# Patient Record
Sex: Male | Born: 1945
Health system: Southern US, Community
[De-identification: ages and names within clinical notes are randomized; demographics above are authoritative.]

## PROBLEM LIST (undated history)

## (undated) DIAGNOSIS — I2510 Atherosclerotic heart disease of native coronary artery without angina pectoris: Secondary | ICD-10-CM

## (undated) DIAGNOSIS — R7989 Other specified abnormal findings of blood chemistry: Secondary | ICD-10-CM

## (undated) DIAGNOSIS — Z7901 Long term (current) use of anticoagulants: Secondary | ICD-10-CM

## (undated) DIAGNOSIS — F419 Anxiety disorder, unspecified: Secondary | ICD-10-CM

## (undated) DIAGNOSIS — F32A Depression, unspecified: Secondary | ICD-10-CM

## (undated) DIAGNOSIS — I219 Acute myocardial infarction, unspecified: Secondary | ICD-10-CM

## (undated) DIAGNOSIS — N189 Chronic kidney disease, unspecified: Secondary | ICD-10-CM

## (undated) DIAGNOSIS — I10 Essential (primary) hypertension: Secondary | ICD-10-CM

## (undated) DIAGNOSIS — C801 Malignant (primary) neoplasm, unspecified: Secondary | ICD-10-CM

## (undated) DIAGNOSIS — Z87442 Personal history of urinary calculi: Secondary | ICD-10-CM

## (undated) DIAGNOSIS — I4892 Unspecified atrial flutter: Secondary | ICD-10-CM

## (undated) DIAGNOSIS — F329 Major depressive disorder, single episode, unspecified: Secondary | ICD-10-CM

## (undated) DIAGNOSIS — H269 Unspecified cataract: Secondary | ICD-10-CM

## (undated) DIAGNOSIS — I779 Disorder of arteries and arterioles, unspecified: Secondary | ICD-10-CM

## (undated) DIAGNOSIS — N200 Calculus of kidney: Secondary | ICD-10-CM

## (undated) DIAGNOSIS — E7800 Pure hypercholesterolemia, unspecified: Secondary | ICD-10-CM

## (undated) DIAGNOSIS — I77819 Aortic ectasia, unspecified site: Secondary | ICD-10-CM

## (undated) DIAGNOSIS — I209 Angina pectoris, unspecified: Secondary | ICD-10-CM

## (undated) DIAGNOSIS — I2699 Other pulmonary embolism without acute cor pulmonale: Secondary | ICD-10-CM

## (undated) DIAGNOSIS — G4730 Sleep apnea, unspecified: Secondary | ICD-10-CM

## (undated) DIAGNOSIS — C439 Malignant melanoma of skin, unspecified: Secondary | ICD-10-CM

## (undated) DIAGNOSIS — I77810 Thoracic aortic ectasia: Secondary | ICD-10-CM

## (undated) DIAGNOSIS — E119 Type 2 diabetes mellitus without complications: Secondary | ICD-10-CM

## (undated) DIAGNOSIS — K219 Gastro-esophageal reflux disease without esophagitis: Secondary | ICD-10-CM

## (undated) HISTORY — PX: CORONARY ARTERY BYPASS GRAFT: SHX141

## (undated) HISTORY — PX: CARDIAC CATHETERIZATION: SHX172

## (undated) HISTORY — PX: CATARACT EXTRACTION: SUR2

## (undated) HISTORY — PX: CHOLECYSTECTOMY: SHX55

## (undated) HISTORY — PX: KNEE ARTHROSCOPY: SUR90

## (undated) HISTORY — PX: LAPAROSCOPIC CHOLECYSTECTOMY: SUR755

## (undated) HISTORY — PX: OTHER SURGICAL HISTORY: SHX169

## (undated) HISTORY — DX: Chronic kidney disease, unspecified: N18.9

---

## 1997-09-29 DIAGNOSIS — Z951 Presence of aortocoronary bypass graft: Secondary | ICD-10-CM

## 1997-09-29 HISTORY — PX: CORONARY ARTERY BYPASS GRAFT: SHX141

## 1997-09-29 HISTORY — DX: Presence of aortocoronary bypass graft: Z95.1

## 1998-04-30 ENCOUNTER — Inpatient Hospital Stay (HOSPITAL_COMMUNITY): Admission: EM | Admit: 1998-04-30 | Discharge: 1998-05-02 | Payer: Self-pay | Admitting: Internal Medicine

## 1998-05-17 ENCOUNTER — Ambulatory Visit (HOSPITAL_COMMUNITY): Admission: RE | Admit: 1998-05-17 | Discharge: 1998-05-18 | Payer: Self-pay | Admitting: Cardiology

## 1998-06-05 ENCOUNTER — Encounter (HOSPITAL_COMMUNITY): Admission: RE | Admit: 1998-06-05 | Discharge: 1998-09-03 | Payer: Self-pay | Admitting: Cardiology

## 1998-07-12 ENCOUNTER — Observation Stay (HOSPITAL_COMMUNITY): Admission: AD | Admit: 1998-07-12 | Discharge: 1998-07-13 | Payer: Self-pay | Admitting: Cardiology

## 1998-09-20 ENCOUNTER — Inpatient Hospital Stay (HOSPITAL_COMMUNITY): Admission: AD | Admit: 1998-09-20 | Discharge: 1998-09-25 | Payer: Self-pay | Admitting: Interventional Cardiology

## 1998-09-21 ENCOUNTER — Encounter: Payer: Self-pay | Admitting: Cardiothoracic Surgery

## 1998-09-22 ENCOUNTER — Encounter: Payer: Self-pay | Admitting: Cardiothoracic Surgery

## 1998-09-23 ENCOUNTER — Encounter: Payer: Self-pay | Admitting: Cardiothoracic Surgery

## 1998-09-24 ENCOUNTER — Encounter: Payer: Self-pay | Admitting: Cardiothoracic Surgery

## 1998-10-30 ENCOUNTER — Encounter (HOSPITAL_COMMUNITY): Admission: RE | Admit: 1998-10-30 | Discharge: 1999-01-28 | Payer: Self-pay | Admitting: Cardiology

## 2001-05-13 ENCOUNTER — Encounter: Admission: RE | Admit: 2001-05-13 | Discharge: 2001-05-13 | Payer: Self-pay | Admitting: Dermatology

## 2001-05-13 ENCOUNTER — Encounter: Payer: Self-pay | Admitting: Dermatology

## 2001-05-20 ENCOUNTER — Encounter: Payer: Self-pay | Admitting: Dermatology

## 2001-05-20 ENCOUNTER — Ambulatory Visit (HOSPITAL_COMMUNITY): Admission: RE | Admit: 2001-05-20 | Discharge: 2001-05-20 | Payer: Self-pay | Admitting: Dermatology

## 2001-12-07 ENCOUNTER — Inpatient Hospital Stay (HOSPITAL_COMMUNITY): Admission: EM | Admit: 2001-12-07 | Discharge: 2001-12-14 | Payer: Self-pay

## 2001-12-08 ENCOUNTER — Encounter: Payer: Self-pay | Admitting: Internal Medicine

## 2001-12-09 ENCOUNTER — Encounter: Payer: Self-pay | Admitting: Internal Medicine

## 2001-12-11 ENCOUNTER — Encounter: Payer: Self-pay | Admitting: Internal Medicine

## 2002-04-26 ENCOUNTER — Encounter: Payer: Self-pay | Admitting: Family Medicine

## 2002-04-26 ENCOUNTER — Encounter: Admission: RE | Admit: 2002-04-26 | Discharge: 2002-04-26 | Payer: Self-pay | Admitting: Family Medicine

## 2002-11-01 ENCOUNTER — Encounter: Payer: Self-pay | Admitting: Internal Medicine

## 2002-11-01 ENCOUNTER — Encounter: Admission: RE | Admit: 2002-11-01 | Discharge: 2002-11-01 | Payer: Self-pay | Admitting: Internal Medicine

## 2002-12-02 ENCOUNTER — Encounter: Payer: Self-pay | Admitting: General Surgery

## 2002-12-02 ENCOUNTER — Encounter (INDEPENDENT_AMBULATORY_CARE_PROVIDER_SITE_OTHER): Payer: Self-pay | Admitting: *Deleted

## 2002-12-02 ENCOUNTER — Ambulatory Visit (HOSPITAL_COMMUNITY): Admission: RE | Admit: 2002-12-02 | Discharge: 2002-12-03 | Payer: Self-pay | Admitting: General Surgery

## 2003-10-23 ENCOUNTER — Ambulatory Visit (HOSPITAL_COMMUNITY): Admission: RE | Admit: 2003-10-23 | Discharge: 2003-10-23 | Payer: Self-pay | Admitting: Cardiology

## 2004-12-16 ENCOUNTER — Encounter: Admission: RE | Admit: 2004-12-16 | Discharge: 2004-12-16 | Payer: Self-pay | Admitting: Internal Medicine

## 2005-09-16 ENCOUNTER — Encounter (INDEPENDENT_AMBULATORY_CARE_PROVIDER_SITE_OTHER): Payer: Self-pay | Admitting: Specialist

## 2005-09-16 ENCOUNTER — Ambulatory Visit (HOSPITAL_COMMUNITY): Admission: RE | Admit: 2005-09-16 | Discharge: 2005-09-16 | Payer: Self-pay | Admitting: Gastroenterology

## 2008-09-26 ENCOUNTER — Encounter: Admission: RE | Admit: 2008-09-26 | Discharge: 2008-09-26 | Payer: Self-pay | Admitting: Oral Surgery

## 2011-02-14 NOTE — Discharge Summary (Signed)
[place]. [facility]  Patient:    Evan [name], Evan [name] Visit Number: [id number] MRN: [id number]          Service Type: MED Location: [phone] Attending Physician:  Jerl Santos Dictated by:   Pearla Dubonnet, M.D. Admit Date:  12/07/2001 Discharge Date: 12/14/2001   CC:         [name], M.D. [facility])  Kerrin Champagne, M.D.   Discharge Summary  NO DICTATION Dictated by:   Pearla Dubonnet, M.D. Attending Physician:  Jerl Santos DD:  12/14/01 TD:  12/14/01 Job: [phone] [id number]

## 2011-02-14 NOTE — Consult Note (Signed)
[place]. [facility]  Patient:    Evan [name], Evan [name] Visit Number: [id number] MRN: [id number]          Service Type: MED Location: MICU 2108 01 Attending Physician:  Jerl Santos Dictated by:   Kerrin Champagne, M.D. Proc. Date: 12/09/01 Admit Date:  12/07/2001                            Consultation Report  REFERRING PHYSICIAN:  Dianna Limbo, M.D.,  DIAGNOSES: 1. Bilateral prepatella bursitis and right olecranon bursitis, gouty    inflammatory bursitis versus septic bursitis. 2. Melanoma, status post left cervical lymph node resection with one of four    T nodes positive.  Status post resection of melanoma left anterior distal    leg. 3. Acute renal insufficiency due to dehydration. 4. Bilateral pulmonary emboli.  HISTORY OF PRESENT ILLNESS:  The patient is a 65 year old male with a history of melanoma admitted with a history of bilateral pulmonary embolus and acute renal insufficiency.  The patient was found to have a few white cells within his urine and significant elevated sugar in his urine with a history of diabetes mellitus.  He has had cultures which have returned negative on urine.  He was admitted on December 07, 2001, and has over the past one day, two days postadmission, began developing swelling and more tenderness over both anterior knees and both ankles.  Temperature is low grade, 99.6.  Initial white cell count was elevated and has since diminished.  There is a past history of gout versus pseudogout treated by Dr. Georges Lynch. Gioffre in the past and has had arthroscopic surgery in the past and was told he had gout.  Clinically, his exam shows bilateral knee swelling with boggy feeling prepatella bursa both sides and over the right olecranon bursa.  He is able to move his joints quite easily and readily.  Minimal effusion both knees. Ankles without appreciable effusion.  Laboratory studies showed a BUN today, December 09, 2001, of 47,  creatinine 2.9, uric acid level is 6.2.  Radiographs are pending.  Clinically full range of motion of both knees and right elbow.  Bogginess as noted above.  Today, I attempted aspiration of the left knee via lateral aspiration of the joint line using local anesthetic and sterile prep with Betadine.  This was then dry tapped with no fluid removed.  An 18 gauge needle was used.  IMPRESSION:  Synovitis both knees and ankles involving primarily the prepatella bursa and synovial lining of the joints.  Right elbow olecranon bursitis.  The patients clinical course with the history of acute renal failure is most consistent with gouty flare; however, the possibility of septic arthritis does exist.  The patients uric acid level is not significantly; however, we may be seeing results of previously elevated uric acid level and deposition within the joints.  PLAN:  Unable to aspirate fluid from the left knee.  Therefore, a steroid injection was not performed.  Went ahead and started the patient on colchicine after discussion with Dr. Dianna Limbo who has discussed issues regarding this medication with Dr. Fayrene Fearing L. Deterding of renal.  I have discussed with Dr. Gerarda Fraction. Murinson the possibility of the use of steroids.  He has indicated that steroids to may be used in the setting of acute gout if necessary.  The patient will be started on colchicine hopefully within the next six to eight hours.  We will have a better understanding of his current process should improvement occur.  We will follow with you.  Presently, I do not see any need for aspiration of his bursa cavities. Dictated by:   Kerrin Champagne, M.D. Attending Physician:  Jerl Santos DD:  12/09/01 TD:  12/11/01 Job: [id number]

## 2011-02-14 NOTE — H&P (Signed)
[place]. [facility]  Patient:    Evan [name], Evan [name] Visit Number: [id number] MRN: [id number]          Service Type: MED Location: MICU 2108 01 Attending Physician:  Jerl Santos Dictated by:   Jerl Santos, M.D. Admit Date:  12/07/2001                           History and Physical  DATE OF BIRTH: 1946-04-19  CHIEF COMPLAINT: Pulmonary embolus.  HISTORY OF PRESENT ILLNESS: The patient reports that earlier today he began to experience flank discomfort associated with increased difficulty breathing. He was transported to the emergency room, where the initial consideration was a possible kidney stone.  Subsequent work-up included a spiral CT scan, which revealed evidence of multiple pulmonary emboli.  In addition, this diagnosis was further confirmed by the finding of hypoxia.  Currently he is receiving oxygen and intravenous heparin.  Problem #2 is vomiting and dehydration.  Evan [name] states that he was discharged from the hospital about five days ago after undergoing a biopsy of a lymph node.  Saturday and Sunday he felt reasonably well but noticed that he was having some difficulty urinating.  By Monday he was having a lot of difficulty urinating, and in addition was experiencing increased nausea and vomiting.  He was unable to eat.  After about 2 a.m. this morning he noticed that it was really very difficult for him to urinate.  He did not pass any urine after this time.  Vomiting and diarrhea persisted and on presentation to the emergency room he has continued to feel nauseated, and has had at least one loose stool.  There does not seem to have been any hematemesis or melena. In the emergency room he was noted to have very concentrated urine, and his creatinine was noted to be 2.7.  Problem #3 is possible febrile illness.  During the course of this illness his wife feels that he may have been running a temperature and having  chills.  He does not seem to have had any other associated problems except as noted above.  Problem #4 is diabetes.  The patient has a long-standing history of diabetes. He states that since his discharge from the hospital he has not measured his blood sugar.  He states that he is compliant with his medications, which are outlined below.  Problem #5 is coronary artery disease.  Evan [name] is status post bypass surgery September 19, 1998.  He has not had any recent problems with exertional chest pain.  Problem #6 is hypertension.  The patient is compliant with his medications. There is no history of congestive heart failure.  His medicines are as outlined below.  ALLERGIES: He is allergic to PENICILLIN.  CURRENT MEDICATIONS:  1. Actos 30 mg q.d.  2. Glucophage 1 g b.i.d.  3. Tricor 200 mg q.d.  4. Accupril 20 mg q.d.  5. Paxil 20 mg q.d.  6. Toprol XL 100 mg q.d.  7. Pravachol 20 mg q.d.  8. Aspirin 325 mg q.d.  9. Vitamin 400 IU q.d. 10. Folic acid 1 mg q.d. 11. CoQ10 75 mg q.d. 12. Centrum Silver one q.d. 13. Vitamin B one q.d. 14. Organic flaxseed oil.  PAST MEDICAL HISTORY:  1. The patient is status post CABG September 19, 1998, and underwent a LIMA     to LAD as well as a saphenous vein graft to the diagonal,  left circumflex,     and PDA vessels.  2. He also underwent right knee arthroscopy in 1993.  3. In addition, the patient was diagnosed with melanoma, which was found in     both his nose and his left leg.  More recently he has had a swollen lymph     node, which has been biopsied and excised.  The pathology on this lymph     node is pending.  HEALTH MAINTENANCE: The patient had sigmoidoscopy February 1999, which was unremarkable.  FAMILY HISTORY: The patients father died at the age of 25 of heart disease. Mother died at the age of 47 of hypertension and heart disease, as well as a stroke.  He has a sister who is 43 and has hypertension and  diabetes.  SOCIAL HISTORY: He does not smoke.  He denies use of alcohol or drugs.  REVIEW OF SYSTEMS: HEAD: He denies headache or dizziness.  EYES: He denies visual blurring or diplopia.  EARS/NOSE/THROAT: Denies earache, sinus pain, or sore throat.  CHEST: Denies coughing, otherwise see above.  CARDIOVASCULAR: See above.  GI: See above.  GU: He has had no dysuria.  No back pain or unusual hematuria.  NEUROLOGIC: He denies seizure or stroke.  ENDOCRINE: He denies excessive thirst, urinary frequency, or nocturia.  PHYSICAL EXAMINATION:  GENERAL: He is an obese gentleman, who exhibits mild to moderate dyspnea.  He is also noted to be tachycardic.  HEENT: Within normal limits.  CHEST: Remarkably clear to auscultation.  It is noteworthy that he has a well-healed incision on the left neck area, with a drain in place.  It does not appear to be grossly infected.  CARDIOVASCULAR: Normal S1 and S2 without rubs, murmurs, or gallops.  ABDOMEN: Benign.  There are normal bowel sounds without masses, tenderness, or organomegaly.  NEUROLOGIC: Normal.  EXTREMITIES: Normal.  IMPRESSION:  1. Pulmonary embolus.  2. Marked dehydration.  3. Nausea and vomiting.  4. History of melanoma, with probable recurrence.  5. Diabetes.  6. Hyperlipidemia.  7. Coronary artery disease, status post coronary artery bypass grafting.  PLAN:  1. [name] will be admitted to the hospital for evaluation of these     problems.  2. He will need to be closely monitored.  3. It will be important to make sure that his oxygenation is regulated and     that he is given vigorous IV hydration.  4. Will monitor his blood sugars and administer sliding-scale insulin     regimen.  5. The anticipation would be that much of his renal dysfunction is prerenal     and that this should improve with vigorous IV hydration.  We will     certainly follow him closely. Dictated by:   Jerl Santos, M.D. Attending  Physician:  Jerl Santos DD:  12/07/01 TD:  12/08/01  Job: [id number]

## 2011-02-14 NOTE — Op Note (Signed)
Evan [name], ROTE NO.:  [id number]   MEDICAL RECORD NO.:  [id number]          PATIENT TYPE:  AMB   LOCATION:  ENDO                         FACILITY:  [facility]   PHYSICIAN:  Danise Edge, M.D.   DATE OF BIRTH:  1946-04-11   DATE OF PROCEDURE:  09/16/2005  DATE OF DISCHARGE:                                 OPERATIVE REPORT   PROCEDURE:  Screening colonoscopy.   INDICATIONS:  Mr. Wasif Simonich is a 65 year old male born December 26, 1945. Mr. Marchetta is scheduled to undergo his first screening colonoscopy  with polypectomy to prevent colon cancer.   ENDOSCOPIST:  Danise Edge, M.D.   PREMEDICATION:  Versed 7 mg, Demerol 50 mg.   DESCRIPTION OF PROCEDURE:  After obtaining informed consent, Mr. Degrazia  was placed in the left lateral decubitus position. I administered  intravenous Demerol and intravenous Versed to achieve conscious sedation for  the procedure. The patient's blood pressure, oxygen saturation and cardiac  rhythm were monitored throughout the procedure and documented in the medical  record.   Anal inspection was normal. Digital rectal exam reveals a nonnodular  prostate. The Olympus adjustable pediatric colonoscope was introduced into  the rectum and advanced to the cecum. A normal-appearing ileocecal valve and  appendiceal orifice were identified. Colonic preparation for the exam today  was satisfactory.   RECTUM:  Normal. Retroflex view of the distal rectum normal.  SIGMOID COLON AND DESCENDING COLON:  Sigmoid colonic diverticulosis. From 30  - 35 cm from the anal verge, a 1 mm sessile polyp and 2 mm sessile polyp  were removed with the cold biopsy forceps.  SPLENIC FLEXURE:  Normal.  TRANSVERSE COLON:  Normal.  HEPATIC FLEXURE:  Normal.  ASCENDING COLON:  Normal.  CECUM AND ILEOCECAL VALVE:  Normal.   ASSESSMENT:  1.  Two diminutive polyps were removed from the sigmoid colon at 30 - 35 cm      from the anal verge.  2.   Extensive left colonic diverticulosis.           ______________________________  Danise Edge, M.D.     MJ/MEDQ  D:  09/16/2005  T:  09/17/2005  Job:  [id number]   cc:   Candyce Churn, M.D.  Fax: [phone]

## 2011-02-14 NOTE — Discharge Summary (Signed)
[place]. [facility]  Patient:    Evan [name], Evan [name] Visit Number: [id number] MRN: [id number]          Service Type: MED Location: [phone] Attending Physician:  Jerl Santos Dictated by:   Pearla Dubonnet, M.D. Admit Date:  12/07/2001 Discharge Date: 12/14/2001   CC:         Evan [name], M.D. [facility])  Kerrin Champagne, M.D.  Francisca December, M.D.   Discharge Summary  DISCHARGE DIAGNOSES:  1. Bilateral pulmonary emboli.  2. Probable drug rash to either Tequin or Diflucan-resolved.  3. Recurrent melanoma with recent left neck and submandibular areas     resectioned of tumor mass and lymph node dissection.  4. Renal insufficiency secondary to dehydration and probable mild acute     tubular necrosis-resolved.  5. Type II diabetes.  6. Hyperlipidemia.  7. Coronary artery disease-status post coronary artery bypass graft with left     internal mammary artery to left anterior descending and saphenous vein     graft to diagonal left circumflex and posterior descending artery.  8. Depression.  9. Hypertension. 10. History of gout versus pseudogout. 11. Gastroesophageal reflux disease. 12. Hypotestosteronism with decreased libido.  DISCHARGE MEDICATIONS:  1. Celexa 20 mg daily.  2. Protonix 40 mg daily.  3. Mycelex troches 10 mg dissolved in mouth three times a day for one week.  4. Toprol 50 mg daily.  5. Actos 30 mg daily.  6. Glucophage 1 gm b.i.d.  7. Tricor 160 mg daily.  8. Pravachol 20 mg daily.  9. Centrum Silver one daily. 10. Coumadin 5 mg daily.  CONSULTATIONS:  Orthopedics, Dr. Vira Browns.  PROCEDURES: 1. Pulmonary ventilation perfusion scanning December 07, 2001:  Revealed    multiple subsegmental and segmental perfusion defects bilaterally, high    probability for pulmonary embolus. 2. CT scan of the abdomen and pelvis December 07, 2001 revealed:    a. Cholelithiasis.    b. Small nonobstructive  bilateral renal calculi.    c. Diverticulosis. 3. Ultrasound of the kidneys and aorta December 08, 2001:  Within normal limits. 4. 2-D echocardiogram December 11, 2001 revealed:  Ejection fraction 55-65%.  No    evidence of valvular vegetation.  No recent wall motion abnormalities.  HOSPITAL COURSE: #1 - PULMONARY EMBOLI:  The patient developed flank discomfort on the day of admission with severe shortness of breath.  He was hypoxic and mildly hypotensive in the emergency room on presentation to the emergency room. Because of the hypoxia, sudden shortness of breath and upper flank pain, ventilation perfusion scanning was performed.  This revealed multiple bilateral pulmonary emboli.  CT scan of the chest was not performed because of renal insufficiency on admission.  He was started on IV heparin therapy and converted to Coumadin.  His INR on discharge was 2.2.  He will be treated with Coumadin for at least six months.  #2 - VOMITING, DEHYDRATION AND RENAL INSUFFICIENCY:  Patient had been discharged from [facility] five days prior to admission here after undergoing lymph node resection just inferior to the left mandible that was positive for recurrent melanoma.  Multiple lymph nodes were dissected down through the neck and none were positive for tumor.  He presented with a JP drain in the surgical site.  He developed some nausea and vomiting the day prior to admission as well as diarrhea.  This persisted for about 24 hours prior to presentation to the [facility] Emergency  Room where his creatinine was noted to be 2.7, which is markedly elevated for him.  With progressive intravenous hydration, blood pressures, which were in the 80-90s on presentation to the emergency room, normalized.  Creatinine actually rose on the second day of admission to 3.3 from 2.7.  By the day of discharge his creatinine was 1.1 and BUN was 10.1-completely normalizing.  He was felt to have probable mild ATN,  which resolved.  #3 - DIABETES MELLITUS:  Blood sugars were relatively well controlled throughout his hospitalization.  He was treated with sliding scale regular insulin.  Actos was resumed several days prior to discharge and Glucophage will be resumed as he returns home.  His blood sugar on the day of discharge was 171.  #4 - HYPERTENSION:  This was well controlled during hospitalization.  He will be discharged on the medications above.  #5 - RASH:  Patient developed a rash about 24 hours after admission.  This was in his lower extremities and was erythematous around his knees and ankles initially.  It was not painful.  The rash was erythematous.  There was some edema associated with it.  Because it seemed to be over the knees and one of his ankles initially, orthopedics was consulted for the possibility of gout or pseudogout.  He actually had attempted aspiration of one of his knees but it was a dry tap. He was started on colchicine really with no change in the rash.  Interestingly enough, the only medicine that he had taken prior to the rash beginning on the day of it starting at approximately noon was Diflucan that morning and he had a dose of Tequin the night before.  Both of these medicines were stopped and the rash resolved.  It is assumed he may have had a drug reaction to one of those two medications.  Regardless, the rash resolved.  #6 - MELANOMA:  Patient will be followed at [facility] by Dr. Deveron [name]. There was no evidence of any disease on chest x-ray or on pelvic or abdominal CAT scanning during this hospitalization.  #7 - HYPERLIPIDEMIA:  Patient will resume Tricor and Pravachol when he returns home.  #8 - ANTICOAGULATION THERAPY:  Will shoot for INR of 2-3 chronically.  We will treat at least for six months for his pulmonary embolus.  #9 - PERSISTENT EXUDATIVE UPPER LEFT CHEST JP DRAIN SITE:  This actually grew  two colonies of Staph aureus from his JP drain  tip which was removed when his drain stopped draining less than 30 cc a day.  It was coagulase negative Staph.  He has been afebrile with antibiotics and he has really no surrounding erythema in the site where there is some exudative granulation tissue in the left upper chest.  We will treat with wet-to-dry dressings.  He needs to followup with Dr. Flonnie Hailstone within the next week or so.  #10 - BRONCHOSPASM:  Patient did experience some bronchospasm not listed in the discharge diagnoses above.  He responded to Advair and Xopenex nebulizer treatments.  He was on Lopressor 50 mg b.i.d. and this will be reduced to Toprol XL 50 mg daily.  Not sure of the etiology of the bronchospasm except perhaps it had something to do with his bilateral pulmonary emboli and Beta-blocker therapy.  His lungs were clear on discharge and he will notify me if he has any further bronchospasm.  DISCHARGE LABORATORIES:  Laboratories from December 14, 2001:  Hemoglobin 12.4, white blood cell count 9000, platelet  count 244,000, sodium 138, potassium 3.4, chloride 103, bicarbonate 27, BUN 10, creatinine 1.1, blood sugar 171, prothrombin time 21.2 seconds, INR 2.2.  LFTs from December 11, 2001 revealed SGOT 42, SGPT 40, alkaline phosphatase 139, total bilirubin 1.4.  Calcium on December 14, 2001 was 8.9.  Glycosylated hemoglobin on December 07, 2001 was 7.2%.  FOLLOWUP:  Patient will follow up in my office in three days for prothrombin time, INR and short office visit.  He is to follow up with Dr. Deveron [name] within the next one to two weeks. Dictated by:   Pearla Dubonnet, M.D. Attending Physician:  Jerl Santos DD:  12/14/01 TD:  12/14/01 Job: [id number]

## 2011-02-14 NOTE — Op Note (Signed)
NAME:  Evan [name], Evan [name]                     ACCOUNT NO.:  [id number]   MEDICAL RECORD NO.:  [id number]                   PATIENT TYPE:  OIB   LOCATION:  5729                                 FACILITY:  [facility]   PHYSICIAN:  Ollen Gross. Vernell Morgans, M.D.              DATE OF BIRTH:  19-Apr-1946   DATE OF PROCEDURE:  12/05/2002  DATE OF DISCHARGE:  12/03/2002                                 OPERATIVE REPORT   PREOPERATIVE DIAGNOSES:  Cholelithiasis.   POSTOPERATIVE DIAGNOSES:  Cholelithiasis.   OPERATION PERFORMED:  Laparoscopic cholecystectomy with intraoperative  cholangiogram.   SURGEON:  Ollen Gross. Carolynne Edouard, M.D.   ASSISTANT:  Rose Phi. Maple Hudson, M.D.   ANESTHESIA:  General endotracheal.   DESCRIPTION OF PROCEDURE:  After informed consent was obtained, the patient  was brought to the operating room and placed in supine position on the  operating table.  After adequate induction of general anesthesia, the  patient's abdomen was prepped with Betadine and draped in the usual sterile  manner.  The area above the umbilicus was infiltrated with 0.25% Marcaine. A  small incision was made with a 15 blade knife and blunt dissection was then  carried down through this incision and using Kelly clamp and [employer]  retractors until the linea alba was identified, the linea alba was then  incised with a 15 blade knife.  Each side was grasped with Kocher clamps and  elevated anteriorly.  The preperitoneal space was then probed bluntly with a  hemostat until the peritoneum was opened and access was gained to the  abdominal cavity.  A 0 Vicryl pursestring suture was then placed on the  fascia surrounding this opening.  A Hasson cannula was then placed through  the opening and anchored in placed with the previously placed Vicryl  pursestring stitch.  The abdomen was insufflated with carbon dioxide without  difficulty.  The patient was placed in a head up position and the  laparoscope was placed through the  Hasson cannula and the right upper  quadrant was inspected.  The dome of the gallbladder and liver edge were  readily identified.  The epigastric region was then infiltrated with 0.25%  Marcaine.  A small incision was made with a 15 blade knife and a 10 mm port  was placed bluntly through the incision into the abdominal cavity under  direct vision.  Sites were then chosen laterally on the right side of the  abdomen for placement of 5 mm ports.  Each of these areas was infiltrated  with 0.25% Marcaine.  Small stab incisions were made with a 15 blade knife  and 5 mm ports were placed bluntly through these incisions into the  abdominal cavity under direct vision.  A blunt grasper was placed through  the lateral most 5 mm port and used to grasp the dome of the gallbladder and  elevate it anteriorly and superiorly.  Another blunt grasper was placed  through the other 5 mm port and used to retract on the body and neck of the  gallbladder.  A dissector was placed through the upper midline port and  using the electrocautery and blunt dissection, some adhesions to the body of  the gallbladder were taken down.  Once the gallbladder was free, the  peritoneal reflection at the gallbladder neck area was opened with the  electrocautery.  Blount dissection was then carried out in this area until  the gallbladder neck cystic duct junction was readily identified and a good  window was created.  A clip was then placed on the gallbladder neck.  A  small ductotomy was made with the laparoscopic scissors.  A 12 gauge  Angiocath was placed percutaneously, through the anterior abdominal wall.  A  Reddick cholangiogram catheter was then placed through the Angiocath and  flushed.  The cholangiogram catheter was placed within the cystic duct and  anchored in place with a clip.  A cholangiogram was then obtained that  showed good emptying into the duodenum and no filling defects with good  length on the cystic duct.   The anchoring clip and catheters were then  removed from the patient.  Three clips were then placed proximally on the  cystic duct and the duct was divided between the two sets of clips.  Posterior to this, the cystic artery was identified and again dissected in a  circumferential manner bluntly.  Two clips were placed proximally, one  distally on the artery and the artery was divided between the two.  Next, a  laparoscopic hook cautery device was used to separate the gallbladder from  the liver bed.  Prior to completely detaching the gallbladder from the liver  bed, the liver bed was inspected and several bleeding points were coagulated  with the electrocautery until the liver bed was hemostatic.  A gallbladder  was then detached the rest of the way from the liver bed without difficulty.  The laparoscope was then moved to the upper midline port and a gallbladder  grasper was placed through the Hasson cannula.  The neck of the gallbladder  was grasped and removed with the Hasson cannula through the superior  umbilical port.  The fascial defect was then closed with the previously  placed Vicryl pursestring stitch.  The abdomen was then irrigated with  copious amounts of saline until the effluent was clear.  The rest of the  ports were removed under direct vision and found to be hemostatic and the  gas was allowed to escape.  The incisions were all closed with interrupted 4-  0 Monocryl subcuticular stitches.  Benzoin, Steri-Strips and sterile  dressings were applied.  The patient tolerated the procedure well.  At the  end of the case all sponge, needle and instrument counts were correct.  The  patient was awakened and taken to the recovery room in stable condition.                                                  Ollen Gross. Vernell Morgans, M.D.    PST/MEDQ  D:  12/05/2002  T:  12/06/2002  Job:  [id number]

## 2011-10-07 ENCOUNTER — Ambulatory Visit (INDEPENDENT_AMBULATORY_CARE_PROVIDER_SITE_OTHER): Payer: Self-pay | Admitting: Family Medicine

## 2011-10-07 DIAGNOSIS — Z713 Dietary counseling and surveillance: Secondary | ICD-10-CM

## 2011-10-10 DIAGNOSIS — Z7901 Long term (current) use of anticoagulants: Secondary | ICD-10-CM | POA: Diagnosis not present

## 2011-10-10 DIAGNOSIS — E291 Testicular hypofunction: Secondary | ICD-10-CM | POA: Diagnosis not present

## 2011-11-14 DIAGNOSIS — E291 Testicular hypofunction: Secondary | ICD-10-CM | POA: Diagnosis not present

## 2011-11-14 DIAGNOSIS — Z7901 Long term (current) use of anticoagulants: Secondary | ICD-10-CM | POA: Diagnosis not present

## 2011-12-10 DIAGNOSIS — L578 Other skin changes due to chronic exposure to nonionizing radiation: Secondary | ICD-10-CM | POA: Diagnosis not present

## 2011-12-10 DIAGNOSIS — L570 Actinic keratosis: Secondary | ICD-10-CM | POA: Diagnosis not present

## 2011-12-21 ENCOUNTER — Emergency Department (HOSPITAL_COMMUNITY): Payer: 59

## 2011-12-21 ENCOUNTER — Encounter (HOSPITAL_COMMUNITY): Payer: Self-pay | Admitting: *Deleted

## 2011-12-21 ENCOUNTER — Emergency Department (HOSPITAL_COMMUNITY)
Admission: EM | Admit: 2011-12-21 | Discharge: 2011-12-22 | Disposition: A | Discharge status: Home / Self Care | Payer: 59 | Attending: Emergency Medicine | Admitting: Emergency Medicine

## 2011-12-21 DIAGNOSIS — Z79899 Other long term (current) drug therapy: Secondary | ICD-10-CM | POA: Insufficient documentation

## 2011-12-21 DIAGNOSIS — I2510 Atherosclerotic heart disease of native coronary artery without angina pectoris: Secondary | ICD-10-CM | POA: Insufficient documentation

## 2011-12-21 DIAGNOSIS — F329 Major depressive disorder, single episode, unspecified: Secondary | ICD-10-CM | POA: Insufficient documentation

## 2011-12-21 DIAGNOSIS — K7689 Other specified diseases of liver: Secondary | ICD-10-CM | POA: Diagnosis not present

## 2011-12-21 DIAGNOSIS — E119 Type 2 diabetes mellitus without complications: Secondary | ICD-10-CM | POA: Insufficient documentation

## 2011-12-21 DIAGNOSIS — R112 Nausea with vomiting, unspecified: Secondary | ICD-10-CM | POA: Insufficient documentation

## 2011-12-21 DIAGNOSIS — R109 Unspecified abdominal pain: Secondary | ICD-10-CM | POA: Insufficient documentation

## 2011-12-21 DIAGNOSIS — N201 Calculus of ureter: Secondary | ICD-10-CM | POA: Insufficient documentation

## 2011-12-21 DIAGNOSIS — E7800 Pure hypercholesterolemia, unspecified: Secondary | ICD-10-CM | POA: Insufficient documentation

## 2011-12-21 DIAGNOSIS — F3289 Other specified depressive episodes: Secondary | ICD-10-CM | POA: Insufficient documentation

## 2011-12-21 DIAGNOSIS — N1330 Unspecified hydronephrosis: Secondary | ICD-10-CM | POA: Insufficient documentation

## 2011-12-21 DIAGNOSIS — I10 Essential (primary) hypertension: Secondary | ICD-10-CM | POA: Insufficient documentation

## 2011-12-21 HISTORY — DX: Other pulmonary embolism without acute cor pulmonale: I26.99

## 2011-12-21 HISTORY — DX: Essential (primary) hypertension: I10

## 2011-12-21 HISTORY — DX: Major depressive disorder, single episode, unspecified: F32.9

## 2011-12-21 HISTORY — DX: Atherosclerotic heart disease of native coronary artery without angina pectoris: I25.10

## 2011-12-21 HISTORY — DX: Malignant (primary) neoplasm, unspecified: C80.1

## 2011-12-21 HISTORY — DX: Calculus of kidney: N20.0

## 2011-12-21 HISTORY — DX: Pure hypercholesterolemia, unspecified: E78.00

## 2011-12-21 HISTORY — DX: Depression, unspecified: F32.A

## 2011-12-21 LAB — POCT I-STAT, CHEM 8
Calcium, Ion: 1.21 mmol/L (ref 1.12–1.32)
Creatinine, Ser: 2.3 mg/dL — ABNORMAL HIGH (ref 0.50–1.35)
Glucose, Bld: 212 mg/dL — ABNORMAL HIGH (ref 70–99)
HCT: 61 % — ABNORMAL HIGH (ref 39.0–52.0)
Hemoglobin: 20.7 g/dL — ABNORMAL HIGH (ref 13.0–17.0)
TCO2: 26 mmol/L (ref 0–100)

## 2011-12-21 LAB — DIFFERENTIAL
Basophils Absolute: 0 10*3/uL (ref 0.0–0.1)
Basophils Relative: 0 % (ref 0–1)
Eosinophils Relative: 0 % (ref 0–5)
Lymphocytes Relative: 10 % — ABNORMAL LOW (ref 12–46)
Monocytes Absolute: 0.9 10*3/uL (ref 0.1–1.0)
Neutro Abs: 9.7 10*3/uL — ABNORMAL HIGH (ref 1.7–7.7)

## 2011-12-21 LAB — URINALYSIS, ROUTINE W REFLEX MICROSCOPIC
Bilirubin Urine: NEGATIVE
Ketones, ur: 15 mg/dL — AB
Leukocytes, UA: NEGATIVE
Nitrite: NEGATIVE
Urobilinogen, UA: 0.2 mg/dL (ref 0.0–1.0)

## 2011-12-21 LAB — CBC
HCT: 55.8 % — ABNORMAL HIGH (ref 39.0–52.0)
MCHC: 35.3 g/dL (ref 30.0–36.0)
MCV: 90.9 fL (ref 78.0–100.0)
Platelets: 200 10*3/uL (ref 150–400)
RDW: 14.9 % (ref 11.5–15.5)
WBC: 11.8 10*3/uL — ABNORMAL HIGH (ref 4.0–10.5)

## 2011-12-21 LAB — URINE MICROSCOPIC-ADD ON

## 2011-12-21 MED ORDER — ONDANSETRON HCL 4 MG/2ML IJ SOLN
4.0000 mg | Freq: Once | INTRAMUSCULAR | Status: AC
  Administered 2011-12-21: 4 mg via INTRAVENOUS
  Filled 2011-12-21: qty 2

## 2011-12-21 MED ORDER — OXYCODONE-ACETAMINOPHEN 5-325 MG PO TABS: 2.0000 | ORAL_TABLET | ORAL | Status: AC | PRN

## 2011-12-21 MED ORDER — PROMETHAZINE HCL 25 MG PO TABS: 25.0000 mg | ORAL_TABLET | Freq: Four times a day (QID) | ORAL | Status: AC | PRN

## 2011-12-21 MED ORDER — HYDROMORPHONE HCL PF 1 MG/ML IJ SOLN: 1.0000 mg | Freq: Once | INTRAMUSCULAR | Status: DC

## 2011-12-21 MED ORDER — FENTANYL CITRATE 0.05 MG/ML IJ SOLN
50.0000 ug | INTRAMUSCULAR | Status: AC
  Administered 2011-12-21: 50 ug via INTRAVENOUS
  Filled 2011-12-21: qty 2

## 2011-12-21 MED ORDER — SODIUM CHLORIDE 0.9 % IV BOLUS (SEPSIS)
1000.0000 mL | Freq: Once | INTRAVENOUS | Status: AC
  Administered 2011-12-21: 1000 mL via INTRAVENOUS

## 2011-12-21 MED ORDER — HYDROMORPHONE HCL PF 2 MG/ML IJ SOLN
INTRAMUSCULAR | Status: AC
  Administered 2011-12-21: 1 mg
  Filled 2011-12-21: qty 1

## 2011-12-21 NOTE — Discharge Instructions (Signed)
Ureteral Colic (Kidney Stones) Ureteral colic is the result of a condition when kidney stones form inside the kidney. Once kidney stones are formed they may move into the tube that connects the kidney with the bladder (ureter). If this occurs, this condition may cause pain (colic) in the ureter.  CAUSES  Pain is caused by stone movement in the ureter and the obstruction caused by the stone. SYMPTOMS  The pain comes and goes as the ureter contracts around the stone. The pain is usually intense, sharp, and stabbing in character. The location of the pain may move as the stone moves through the ureter. When the stone is near the kidney the pain is usually located in the back and radiates to the belly (abdomen). When the stone is ready to pass into the bladder the pain is often located in the lower abdomen on the side the stone is located. At this location, the symptoms may mimic those of a urinary tract infection with urinary frequency. Once the stone is located here it often passes into the bladder and the pain disappears completely. TREATMENT   Your caregiver will provide you with medicine for pain relief.   You may require specialized follow-up X-rays.   The absence of pain does not always mean that the stone has passed. It may have just stopped moving. If the urine remains completely obstructed, it can cause loss of kidney function or even complete destruction of the involved kidney. It is your responsibility and in your interest that X-rays and follow-ups as suggested by your caregiver are completed. Relief of pain without passage of the stone can be associated with severe damage to the kidney, including loss of kidney function on that side.   If your stone does not pass on its own, additional measures may be taken by your caregiver to ensure its removal.  HOME CARE INSTRUCTIONS   Increase your fluid intake. Water is the preferred fluid since juices containing vitamin C may acidify the urine making  it less likely for certain stones (uric acid stones) to pass.   Strain all urine. A strainer will be provided. Keep all particulate matter or stones for your caregiver to inspect.   Take your pain medicine as directed.   Make a follow-up appointment with your caregiver as directed.   Remember that the goal is passage of your stone. The absence of pain does not mean the stone is gone. Follow your caregiver's instructions.   Only take over-the-counter or prescription medicines for pain, discomfort, or fever as directed by your caregiver.  SEEK MEDICAL CARE IF:   Pain cannot be controlled with the prescribed medicine.   You have a fever.   Pain continues for longer than your caregiver advises it should.   There is a change in the pain, and you develop chest discomfort or constant abdominal pain.   You feel faint or pass out.  MAKE SURE YOU:   Understand these instructions.   Will watch your condition.   Will get help right away if you are not doing well or get worse.  Document Released: 06/25/2005 Document Revised: 09/04/2011 Document Reviewed: 03/12/2011 [employer] Patient Information 2012 [employer]  Kidney Stones Kidney stones (ureteral lithiasis) are deposits that form inside your kidneys. The intense pain is caused by the stone moving through the urinary tract. When the stone moves, the ureter goes into spasm around the stone. The stone is usually passed in the urine.  CAUSES   A disorder that makes certain   neck glands produce too much parathyroid hormone (primary hyperparathyroidism).   A buildup of uric acid crystals.   Narrowing (stricture) of the ureter.   A kidney obstruction present at birth (congenital obstruction).   Previous surgery on the kidney or ureters.   Numerous kidney infections.  SYMPTOMS   Feeling sick to your stomach (nauseous).   Throwing up (vomiting).   Blood in the urine (hematuria).   Pain that usually spreads (radiates) to the  groin.   Frequency or urgency of urination.  DIAGNOSIS   Taking a history and physical exam.   Blood or urine tests.   Computerized X-ray scan (CT scan).   Occasionally, an examination of the inside of the urinary bladder (cystoscopy) is performed.  TREATMENT   Observation.   Increasing your fluid intake.   Surgery may be needed if you have severe pain or persistent obstruction.  The size, location, and chemical composition are all important variables that will determine the proper choice of action for you. Talk to your caregiver to better understand your situation so that you will minimize the risk of injury to yourself and your kidney.  HOME CARE INSTRUCTIONS   Drink enough water and fluids to keep your urine clear or pale yellow.   Strain all urine through the provided strainer. Keep all particulate matter and stones for your caregiver to see. The stone causing the pain may be as small as a grain of salt. It is very important to use the strainer each and every time you pass your urine. The collection of your stone will allow your caregiver to analyze it and verify that a stone has actually passed.   Only take over-the-counter or prescription medicines for pain, discomfort, or fever as directed by your caregiver.   Make a follow-up appointment with your caregiver as directed.   Get follow-up X-rays if required. The absence of pain does not always mean that the stone has passed. It may have only stopped moving. If the urine remains completely obstructed, it can cause loss of kidney function or even complete destruction of the kidney. It is your responsibility to make sure X-rays and follow-ups are completed. Ultrasounds of the kidney can show blockages and the status of the kidney. Ultrasounds are not associated with any radiation and can be performed easily in a matter of minutes.  SEEK IMMEDIATE MEDICAL CARE IF:   Pain cannot be controlled with the prescribed medicine.   You  have a fever.   The severity or intensity of pain increases over 18 hours and is not relieved by pain medicine.   You develop a new onset of abdominal pain.   You feel faint or pass out.  MAKE SURE YOU:   Understand these instructions.   Will watch your condition.   Will get help right away if you are not doing well or get worse.  Document Released: 09/15/2005 Document Revised: 09/04/2011 Document Reviewed: 01/11/2010 [employer] Patient Information 2012 [employer]. 

## 2011-12-21 NOTE — ED Notes (Signed)
Pt is here for evaluation of right flank pain which began this am and is sharp and intermittent

## 2011-12-21 NOTE — ED Provider Notes (Signed)
History     CSN: [id number]  Arrival date & time 12/21/11  2024   First MD Initiated Contact with Patient 12/21/11 2102      Chief Complaint  Patient presents with  . Flank Pain    (Consider location/radiation/quality/duration/timing/severity/associated sxs/prior treatment) HPI Comments: Patient here with sudden onset of right flank pain which started about 1000 this morning - reports that he has a history of kidney stones and this felt like one but states that this one was different because the pain has lasted longer and he has developed nausea and vomiitng - denies fever, chills, gross hematuria, decrease in urine output, decrease in flow of urine, abdominal pain, constipation or diarrhea - reports that the pain is now radiating down to his right testicle.  Patient is a 66 y.o. male presenting with flank pain. The history is provided by the patient and the spouse. No language interpreter was used.  Flank Pain This is a new problem. The current episode started today. The problem occurs constantly. The problem has been unchanged. Associated symptoms include nausea, urinary symptoms and vomiting. Pertinent negatives include no abdominal pain, anorexia, arthralgias, change in bowel habit, chest pain, chills, congestion, coughing, diaphoresis, fatigue, fever, headaches, joint swelling, myalgias, neck pain, numbness, rash, sore throat, swollen glands, vertigo, visual change or weakness. The symptoms are aggravated by nothing. He has tried nothing for the symptoms. The treatment provided no relief.    Past Medical History  Diagnosis Date  . Coronary artery disease   . Cancer   . Diabetes mellitus   . Hypertension   . Depression   . Pulmonary emboli   . Hypercholesteremia   . Kidney stone     Past Surgical History  Procedure Date  . Coronary artery bypass graft     No family history on file.  History  Substance Use Topics  . Smoking status: Not on file  . Smokeless tobacco: Not  on file  . Alcohol Use: No      Review of Systems  Constitutional: Negative for fever, chills, diaphoresis and fatigue.  HENT: Negative for congestion, sore throat and neck pain.   Respiratory: Negative for cough.   Cardiovascular: Negative for chest pain.  Gastrointestinal: Positive for nausea and vomiting. Negative for abdominal pain, anorexia and change in bowel habit.  Genitourinary: Positive for flank pain.  Musculoskeletal: Negative for myalgias, joint swelling and arthralgias.  Skin: Negative for rash.  Neurological: Negative for vertigo, weakness, numbness and headaches.  All other systems reviewed and are negative.    Allergies  Penicillins  Home Medications   Current Outpatient Rx  Name Route Sig Dispense Refill  . AMLODIPINE BESYLATE 5 MG PO TABS Oral Take 5 mg by mouth every evening.    . ASPIRIN EC 81 MG PO TBEC Oral Take 81 mg by mouth every evening.    [name] VITAMIN D3 1000 UNITS PO TABS Oral Take 1,000 Units by mouth every morning.    [name] CITALOPRAM HYDROBROMIDE 40 MG PO TABS Oral Take 40 mg by mouth every morning.    [name] CO-ENZYME Q-10 50 MG PO CAPS Oral Take 100 mg by mouth every morning.    [name] CRANBERRY 500 MG PO CAPS Oral Take 500 mg by mouth every morning.    [name] ESOMEPRAZOLE MAGNESIUM 40 MG PO CPDR Oral Take 40 mg by mouth every morning.    . FENOFIBRATE MICRONIZED 67 MG PO CAPS Oral Take 67 mg by mouth every morning.    [name] FLAXSEED (LINSEED)  PO Oral Take 2 tablets by mouth 2 (two) times daily.    [name] GABAPENTIN 100 MG PO CAPS Oral Take 100 mg by mouth 2 (two) times daily.    . GLYBURIDE 5 MG PO TABS Oral Take 5 mg by mouth every evening.    [name] METFORMIN HCL 1000 MG PO TABS Oral Take 1,000 mg by mouth 2 (two) times daily with a meal.    . METOPROLOL SUCCINATE ER 50 MG PO TB24 Oral Take 50 mg by mouth every morning. Take with or immediately following a meal.    . ADULT MULTIVITAMIN W/MINERALS CH Oral Take 1 tablet by mouth every evening.    [name] OMEGA-3-ACID ETHYL ESTERS 1  G PO CAPS Oral Take 2 g by mouth 2 (two) times daily.    [name] OVER THE COUNTER MEDICATION Oral Take 1 tablet by mouth every morning. Folic Acid with DHA 600 MCG    . OVER THE COUNTER MEDICATION Oral Take 1 tablet by mouth every morning. Grape seed with Resveratol    . PIOGLITAZONE HCL 30 MG PO TABS Oral Take 30 mg by mouth every morning.    [name] POTASSIUM 99 MG PO TABS Oral Take 99 mg by mouth every morning.    [name] RANOLAZINE ER 500 MG PO TB12 Oral Take 500 mg by mouth 2 (two) times daily.    [name] ROSUVASTATIN CALCIUM 20 MG PO TABS Oral Take 20 mg by mouth every evening.    [name] TAMSULOSIN HCL 0.4 MG PO CAPS Oral Take 0.4 mg by mouth every evening.    . TESTOSTERONE CYPIONATE 100 MG/ML IM OIL Intramuscular Inject 150 mg into the muscle every 14 (fourteen) days. For IM use only    . VALSARTAN 160 MG PO TABS Oral Take 80 mg by mouth every morning.    [name] VARDENAFIL HCL 20 MG PO TABS Oral Take 20 mg by mouth daily as needed. For E.D.    . VITAMIN B-12 250 MCG PO TABS Oral Take 250 mcg by mouth every morning.    [name] VITAMIN C 500 MG PO TABS Oral Take 1,000 mg by mouth every morning.    . WARFARIN SODIUM 5 MG PO TABS Oral Take 5 mg by mouth every evening.      BP 147/76  Pulse 103  Temp(Src) 98 F (36.7 C) (Oral)  Resp 16  SpO2 95%  Physical Exam  Nursing note and vitals reviewed. Constitutional: He is oriented to person, place, and time. He appears well-developed and well-nourished. He appears distressed.  HENT:  Head: Normocephalic and atraumatic.  Right Ear: External ear normal.  Nose: Nose normal.  Mouth/Throat: Oropharynx is clear and moist. No oropharyngeal exudate.  Eyes: Conjunctivae are normal. Pupils are equal, round, and reactive to light. No scleral icterus.  Neck: Normal range of motion. Neck supple.  Cardiovascular: Normal rate, regular rhythm and normal heart sounds.  Exam reveals no gallop and no friction rub.   No murmur heard. Pulmonary/Chest: Effort normal and breath sounds normal. No  respiratory distress. He exhibits no tenderness.  Abdominal: Soft. Bowel sounds are normal. He exhibits no distension and no mass. There is no tenderness. There is CVA tenderness. There is no rebound and no guarding.       Right CVA tenderness  Musculoskeletal: Normal range of motion. He exhibits no edema and no tenderness.  Lymphadenopathy:    He has no cervical adenopathy.  Neurological: He is alert and oriented to person, place, and time. No cranial nerve deficit.  Skin: Skin is warm and dry. No rash noted. No erythema. No pallor.  Psychiatric: He has a normal mood and affect. His behavior is normal. Judgment and thought content normal.    ED Course  Procedures (including critical care time)  Labs Reviewed  CBC - Abnormal; Notable for the following:    WBC 11.8 (*)    RBC 6.14 (*)    Hemoglobin 19.7 (*)    HCT 55.8 (*)    All other components within normal limits  DIFFERENTIAL - Abnormal; Notable for the following:    Neutrophils Relative 82 (*)    Neutro Abs 9.7 (*)    Lymphocytes Relative 10 (*)    All other components within normal limits  URINALYSIS, ROUTINE W REFLEX MICROSCOPIC - Abnormal; Notable for the following:    Glucose, UA >1000 (*)    Hgb urine dipstick MODERATE (*)    Ketones, ur 15 (*)    Protein, ur >300 (*)    All other components within normal limits  POCT I-STAT, CHEM 8 - Abnormal; Notable for the following:    Potassium 5.9 (*)    BUN 29 (*)    Creatinine, Ser 2.30 (*)    Glucose, Bld 212 (*)    Hemoglobin 20.7 (*)    HCT 61.0 (*)    All other components within normal limits  URINE MICROSCOPIC-ADD ON   Ct Abdomen Pelvis Wo Contrast  12/21/2011  *RADIOLOGY REPORT*  Clinical Data: Right flank pain.  CT ABDOMEN AND PELVIS WITHOUT CONTRAST  Technique:  Multidetector CT imaging of the abdomen and pelvis was performed following the standard protocol without intravenous contrast.  Comparison: Renal ultrasound performed 12/16/2004  Findings: The visualized  lung bases are clear.  Diffuse coronary artery calcifications are seen.  The patient is status post median sternotomy.  There is diffuse fatty infiltration within the liver, with a few areas of fatty sparing.  The liver is otherwise unremarkable in appearance.  The spleen is within normal limits.  The patient is status post cholecystectomy, with clips noted along the gallbladder fossa.  The pancreas and adrenal glands are unremarkable.  There is minimal right-sided hydronephrosis.  Diffuse bilateral perinephric stranding and fluid are noted, with fluid tracking about Gerota's fascia bilaterally, more prominent on the right, and significant soft tissue inflammation along both ureters, more prominent on the right.  There appears to be an obstructing 6 x 5 mm stone distally within the right ureter, seen 3 cm above the right vesicoureteral junction.  Additional scattered small nonobstructing stones are seen within both kidneys, measuring up to 3 mm in size.  Two left renal cysts are again noted at the lower pole of the left kidney, measuring up to 3.9 cm in size.  In addition, there is a poorly characterized 1.5 cm isodense cystic mass arising posteriorly at the superior pole of the left kidney; though this could reflect a complex cyst, malignancy cannot be excluded.  No free fluid is identified.  The small bowel is unremarkable in appearance.  The stomach is within normal limits.  No acute vascular abnormalities are seen.  Scattered calcification is noted along the abdominal aorta and its branches, including at the origins of both renal arteries; there is slight ectasia of the distal abdominal aorta.  The appendix is normal in caliber and unremarkable in appearance, without evidence of discitis.  The colon is largely decompressed. Scattered diverticulosis is noted along the distal descending and sigmoid colon, without evidence of diverticulitis.  The bladder  is largely decompressed and grossly unremarkable in  appearance.  The prostate is enlarged, measuring 5.4 cm in transverse dimension.  No inguinal lymphadenopathy is seen.  No acute osseous abnormalities are identified.  IMPRESSION:  1.  Minimal right-sided hydronephrosis, with an obstructing 6 x 5 mm stone noted distally in the right ureter, 3 cm above the right vesicoureteral junction. 2.  Significant associated bilateral perinephric stranding and fluid noted, with fluid tracking about Gerota's fascia, more prominent on the right; suggest clinical correlation to exclude pyelonephritis. 3.  1.5 cm isodense cystic mass noted arising posteriorly at the superior pole of the left kidney.  Though this could reflect a complex cyst, malignancy cannot be excluded.  Recommend further assessment on renal protocol contrast CT or MRI, as deemed clinically appropriate. 4.  Diffuse fatty infiltration within the liver. 5.  Diffuse coronary artery calcifications seen. 6.  Scattered calcification along the abdominal aorta and its branches, including at the origins of both renal arteries. 7.  Scattered diverticulosis along the distal descending and sigmoid colon, without evidence of diverticulitis. 8.  Enlarged prostate noted.  Original Report Authenticated By: Tonia Ghent, M.D.     Right kidney stone    MDM  Patient with a history of CRI who presents with 6mm right almost UVJ stone with mild hydroenphrosis.  He does not know the baseline of his creatnine but does report that his doctor is "watching my kidney function" - he is likely at his baseline but because of this we have spoken with Dr. Isabel Caprice and he has reviewed the CT scan and feels that the patient may safely be sent home and follow up in their office this week.  The patient and his wife are aware of this plan and are in agreement, they are also aware that if he is unable to keep down pain medication, pain that is not relieved with medication and any other concerns that they return to [facility] for possible  admission to the hospital.        Scarlette Calico C. [employer], [place] 12/21/11 2349

## 2011-12-21 NOTE — ED Notes (Signed)
Pt states right flank pain started around 8am followed by nausea/vomiting.  Pain came on suddenly and radiates to right testicle.  History of kidney stones but normally will pass quickly.  Pt alert oriented x4

## 2011-12-22 NOTE — ED Provider Notes (Signed)
Medical screening examination/treatment/procedure(s) were conducted as a shared visit with non-physician practitioner(s) and myself.  I personally evaluated the patient during the encounter  Pt with Hx CRI, uncertain reason why CT with bilat stranding but clinically suspect renal colic, not pyelo, d/w urology for f/u.  Hurman Horn, MD 12/22/11 [phone]

## 2012-01-05 ENCOUNTER — Other Ambulatory Visit: Payer: Self-pay | Admitting: Internal Medicine

## 2012-01-05 ENCOUNTER — Ambulatory Visit
Admission: RE | Admit: 2012-01-05 | Discharge: 2012-01-05 | Disposition: A | Discharge status: Home / Self Care | Payer: Medicare Other | Source: Ambulatory Visit | Attending: Internal Medicine | Admitting: Internal Medicine

## 2012-01-05 DIAGNOSIS — N200 Calculus of kidney: Secondary | ICD-10-CM | POA: Diagnosis not present

## 2012-01-14 DIAGNOSIS — L570 Actinic keratosis: Secondary | ICD-10-CM | POA: Diagnosis not present

## 2012-01-16 DIAGNOSIS — E291 Testicular hypofunction: Secondary | ICD-10-CM | POA: Diagnosis not present

## 2012-01-30 DIAGNOSIS — E291 Testicular hypofunction: Secondary | ICD-10-CM | POA: Diagnosis not present

## 2012-02-25 DIAGNOSIS — L570 Actinic keratosis: Secondary | ICD-10-CM | POA: Diagnosis not present

## 2012-03-12 DIAGNOSIS — Z7901 Long term (current) use of anticoagulants: Secondary | ICD-10-CM | POA: Diagnosis not present

## 2012-03-12 DIAGNOSIS — E291 Testicular hypofunction: Secondary | ICD-10-CM | POA: Diagnosis not present

## 2012-03-24 ENCOUNTER — Ambulatory Visit: Admit: 2012-03-24 | Payer: Self-pay | Admitting: Otolaryngology

## 2012-03-24 SURGERY — EXCISION MASS
Anesthesia: General | Laterality: Left

## 2012-03-25 DIAGNOSIS — L570 Actinic keratosis: Secondary | ICD-10-CM | POA: Diagnosis not present

## 2012-03-26 DIAGNOSIS — E291 Testicular hypofunction: Secondary | ICD-10-CM | POA: Diagnosis not present

## 2012-03-30 DIAGNOSIS — Z951 Presence of aortocoronary bypass graft: Secondary | ICD-10-CM | POA: Diagnosis not present

## 2012-03-30 DIAGNOSIS — R221 Localized swelling, mass and lump, neck: Secondary | ICD-10-CM | POA: Diagnosis not present

## 2012-03-30 DIAGNOSIS — Z7982 Long term (current) use of aspirin: Secondary | ICD-10-CM | POA: Diagnosis not present

## 2012-03-30 DIAGNOSIS — Z880 Allergy status to penicillin: Secondary | ICD-10-CM | POA: Diagnosis not present

## 2012-03-30 DIAGNOSIS — C4330 Malignant melanoma of unspecified part of face: Secondary | ICD-10-CM | POA: Diagnosis not present

## 2012-03-30 DIAGNOSIS — E119 Type 2 diabetes mellitus without complications: Secondary | ICD-10-CM | POA: Diagnosis not present

## 2012-03-30 DIAGNOSIS — I2510 Atherosclerotic heart disease of native coronary artery without angina pectoris: Secondary | ICD-10-CM | POA: Diagnosis not present

## 2012-03-30 DIAGNOSIS — E785 Hyperlipidemia, unspecified: Secondary | ICD-10-CM | POA: Diagnosis not present

## 2012-03-30 DIAGNOSIS — Z7901 Long term (current) use of anticoagulants: Secondary | ICD-10-CM | POA: Diagnosis not present

## 2012-03-30 DIAGNOSIS — R220 Localized swelling, mass and lump, head: Secondary | ICD-10-CM | POA: Diagnosis not present

## 2012-04-07 ENCOUNTER — Other Ambulatory Visit: Payer: Self-pay | Admitting: Otolaryngology

## 2012-04-08 ENCOUNTER — Encounter (HOSPITAL_COMMUNITY): Payer: Self-pay | Admitting: Pharmacy Technician

## 2012-04-13 ENCOUNTER — Encounter (HOSPITAL_COMMUNITY)
Admission: RE | Admit: 2012-04-13 | Discharge: 2012-04-13 | Disposition: A | Discharge status: Home / Self Care | Payer: 59 | Source: Ambulatory Visit | Attending: Otolaryngology | Admitting: Otolaryngology

## 2012-04-13 ENCOUNTER — Encounter (HOSPITAL_COMMUNITY): Payer: Self-pay

## 2012-04-13 DIAGNOSIS — I2510 Atherosclerotic heart disease of native coronary artery without angina pectoris: Secondary | ICD-10-CM | POA: Diagnosis not present

## 2012-04-13 DIAGNOSIS — Z01812 Encounter for preprocedural laboratory examination: Secondary | ICD-10-CM | POA: Diagnosis not present

## 2012-04-13 DIAGNOSIS — E119 Type 2 diabetes mellitus without complications: Secondary | ICD-10-CM | POA: Diagnosis not present

## 2012-04-13 DIAGNOSIS — I129 Hypertensive chronic kidney disease with stage 1 through stage 4 chronic kidney disease, or unspecified chronic kidney disease: Secondary | ICD-10-CM | POA: Diagnosis not present

## 2012-04-13 DIAGNOSIS — J359 Chronic disease of tonsils and adenoids, unspecified: Secondary | ICD-10-CM | POA: Diagnosis not present

## 2012-04-13 DIAGNOSIS — I517 Cardiomegaly: Secondary | ICD-10-CM | POA: Diagnosis not present

## 2012-04-13 DIAGNOSIS — N189 Chronic kidney disease, unspecified: Secondary | ICD-10-CM | POA: Diagnosis not present

## 2012-04-13 DIAGNOSIS — Z01818 Encounter for other preprocedural examination: Secondary | ICD-10-CM | POA: Diagnosis not present

## 2012-04-13 DIAGNOSIS — E7800 Pure hypercholesterolemia, unspecified: Secondary | ICD-10-CM | POA: Diagnosis not present

## 2012-04-13 LAB — CBC
Hemoglobin: 17.5 g/dL — ABNORMAL HIGH (ref 13.0–17.0)
MCH: 32.6 pg (ref 26.0–34.0)
MCV: 94.6 fL (ref 78.0–100.0)
Platelets: 174 10*3/uL (ref 150–400)
RBC: 5.36 MIL/uL (ref 4.22–5.81)
WBC: 6 10*3/uL (ref 4.0–10.5)

## 2012-04-13 LAB — SURGICAL PCR SCREEN: Staphylococcus aureus: NEGATIVE

## 2012-04-13 LAB — APTT: aPTT: 30 seconds (ref 24–37)

## 2012-04-13 LAB — BASIC METABOLIC PANEL
CO2: 27 mEq/L (ref 19–32)
Calcium: 10.2 mg/dL (ref 8.4–10.5)
Glucose, Bld: 223 mg/dL — ABNORMAL HIGH (ref 70–99)
Potassium: 4.9 mEq/L (ref 3.5–5.1)
Sodium: 136 mEq/L (ref 135–145)

## 2012-04-13 LAB — PROTIME-INR: INR: 1.11 (ref 0.00–1.49)

## 2012-04-13 NOTE — Progress Notes (Signed)
Dr Anne Fu called for current ekg ,stress test,ov notes.   cabg in 1999

## 2012-04-13 NOTE — Pre-Procedure Instructions (Signed)
20 Evan [name]  04/13/2012   Your procedure is scheduled on:  04/15/12  Report to [facility] at 530 AM.  Call this number if you have problems the morning of surgery: [phone]   Remember:   Do not eat food:After Midnight.  May have clear liquids:until Midnight .  Clear liquids include soda, tea, black coffee, apple or grape juice, broth.  Take these medicines the morning of surgery with A SIP OF WATER: norvasc,celexa,nexium,neurontin,toprol,flomax,diovan   Do not wear jewelry, make-up or nail polish.  Do not wear lotions, powders, or perfumes. You may wear deodorant.  Do not shave 48 hours prior to surgery. Men may shave face and neck.  Do not bring valuables to the hospital.  Contacts, dentures or bridgework may not be worn into surgery.  Leave suitcase in the car. After surgery it may be brought to your room.  For patients admitted to the hospital, checkout time is 11:00 AM the day of discharge.   Patients discharged the day of surgery will not be allowed to drive home.  Name and phone number of your driver: family  Special Instructions: CHG Shower Use Special Wash: 1/2 bottle night before surgery and 1/2 bottle morning of surgery.   Please read over the following fact sheets that you were given: Pain Booklet, Coughing and Deep Breathing, MRSA Information and Surgical Site Infection Prevention

## 2012-04-14 ENCOUNTER — Encounter (HOSPITAL_COMMUNITY): Payer: Self-pay | Admitting: Vascular Surgery

## 2012-04-14 NOTE — Consult Note (Signed)
Anesthesia Chart Review:  Patient is a 66 year old male posted for excision of a left tonsillar mass by Dr. Jearld Fenton on 04/15/12.  History includes non-smoker, obesity with BMI 35.56, CAD s/p CABG '99, DM2, kidney stones, HTN, hypercholesterolemia, depression, metastatic melanoma, bilateral PE after last cancer surgery, chronic renal insufficiency, iron deficiency anemia, carotid artery stenosis (40-59% right, 60-79% left 07/2010).  PCP is Dr. Roselind Messier.  His Cardiologist is Dr. Anne Fu.  He was last seen on 12/19/11. EKG then showed NSR, right BBB, LAFB, Bifascicular block, non-specific ST/Twave changes.  It was felt unchanged from prior.  Continued medical management and one year follow-up was recommended.  His last stress test was on 01/26/09 and showed normal myocardial perfusion, but patient described class II angina while on the treadmill.  EF 57%.  By notes, his prior cardiolite in 01/2008 showed inferior ischemia (he has known occluded SVG to RCA and diagonal), and "ischemia is certainly no worse."  No cath was recommended, but he on Ranexa.    By notes, a cardiac cath in 2000 showed occluded diagonal and RCA grafts and patent LIMA to LAD.  His last echo was in 2003.    CXR on 04/13/12 showed cardiomegaly, without acute disease.  Labs noted.  Cr 1.42, glucose 223, H/H 17.5/50.7, PLT 174.  Coags WNL.    I notified Dr. Judd Gaudier nurse Burna Mortimer of the planned procedure. She reviewed plans with Dr. Anne Fu who felt patient could proceed with low to moderate cardiac risk given his prior history of bypass.  (See clearance note under Cardiology tab.)  Shonna Chock, PA-C

## 2012-04-15 ENCOUNTER — Ambulatory Visit (HOSPITAL_COMMUNITY): Payer: 59 | Admitting: Vascular Surgery

## 2012-04-15 ENCOUNTER — Encounter (HOSPITAL_COMMUNITY): Payer: Self-pay | Admitting: Vascular Surgery

## 2012-04-15 ENCOUNTER — Encounter (HOSPITAL_COMMUNITY): Payer: Self-pay | Admitting: *Deleted

## 2012-04-15 ENCOUNTER — Ambulatory Visit (HOSPITAL_COMMUNITY)
Admission: RE | Admit: 2012-04-15 | Discharge: 2012-04-15 | Disposition: A | Discharge status: Home / Self Care | Payer: 59 | Source: Ambulatory Visit | Attending: Otolaryngology | Admitting: Otolaryngology

## 2012-04-15 ENCOUNTER — Encounter (HOSPITAL_COMMUNITY)
Admission: RE | Disposition: A | Discharge status: Home / Self Care | Payer: Self-pay | Source: Ambulatory Visit | Attending: Otolaryngology

## 2012-04-15 DIAGNOSIS — I2510 Atherosclerotic heart disease of native coronary artery without angina pectoris: Secondary | ICD-10-CM | POA: Insufficient documentation

## 2012-04-15 DIAGNOSIS — J36 Peritonsillar abscess: Secondary | ICD-10-CM | POA: Diagnosis not present

## 2012-04-15 DIAGNOSIS — E119 Type 2 diabetes mellitus without complications: Secondary | ICD-10-CM | POA: Insufficient documentation

## 2012-04-15 DIAGNOSIS — J039 Acute tonsillitis, unspecified: Secondary | ICD-10-CM | POA: Diagnosis not present

## 2012-04-15 DIAGNOSIS — Z01812 Encounter for preprocedural laboratory examination: Secondary | ICD-10-CM | POA: Insufficient documentation

## 2012-04-15 DIAGNOSIS — D3705 Neoplasm of uncertain behavior of pharynx: Secondary | ICD-10-CM | POA: Diagnosis not present

## 2012-04-15 DIAGNOSIS — N189 Chronic kidney disease, unspecified: Secondary | ICD-10-CM | POA: Insufficient documentation

## 2012-04-15 DIAGNOSIS — D3701 Neoplasm of uncertain behavior of lip: Secondary | ICD-10-CM | POA: Diagnosis not present

## 2012-04-15 DIAGNOSIS — J359 Chronic disease of tonsils and adenoids, unspecified: Secondary | ICD-10-CM | POA: Insufficient documentation

## 2012-04-15 DIAGNOSIS — I129 Hypertensive chronic kidney disease with stage 1 through stage 4 chronic kidney disease, or unspecified chronic kidney disease: Secondary | ICD-10-CM | POA: Insufficient documentation

## 2012-04-15 DIAGNOSIS — I10 Essential (primary) hypertension: Secondary | ICD-10-CM | POA: Diagnosis not present

## 2012-04-15 DIAGNOSIS — E7800 Pure hypercholesterolemia, unspecified: Secondary | ICD-10-CM | POA: Insufficient documentation

## 2012-04-15 DIAGNOSIS — J358 Other chronic diseases of tonsils and adenoids: Secondary | ICD-10-CM | POA: Diagnosis not present

## 2012-04-15 DIAGNOSIS — Z01818 Encounter for other preprocedural examination: Secondary | ICD-10-CM | POA: Insufficient documentation

## 2012-04-15 HISTORY — PX: MASS EXCISION: SHX2000

## 2012-04-15 LAB — GLUCOSE, CAPILLARY: Glucose-Capillary: 173 mg/dL — ABNORMAL HIGH (ref 70–99)

## 2012-04-15 SURGERY — EXCISION MASS
Anesthesia: General | Site: Mouth | Laterality: Left | Wound class: Clean Contaminated

## 2012-04-15 MED ORDER — LIDOCAINE HCL (CARDIAC) 20 MG/ML IV SOLN
INTRAVENOUS | Status: DC | PRN
  Administered 2012-04-15: 50 mg via INTRAVENOUS

## 2012-04-15 MED ORDER — LIDOCAINE HCL 4 % MT SOLN
OROMUCOSAL | Status: DC | PRN
  Administered 2012-04-15: 4 mL via TOPICAL

## 2012-04-15 MED ORDER — HYDROMORPHONE HCL PF 1 MG/ML IJ SOLN: 0.2500 mg | INTRAMUSCULAR | Status: DC | PRN

## 2012-04-15 MED ORDER — 0.9 % SODIUM CHLORIDE (POUR BTL) OPTIME
TOPICAL | Status: DC | PRN
  Administered 2012-04-15: 1000 mL

## 2012-04-15 MED ORDER — SUCCINYLCHOLINE CHLORIDE 20 MG/ML IJ SOLN
INTRAMUSCULAR | Status: DC | PRN
  Administered 2012-04-15: 100 mg via INTRAVENOUS

## 2012-04-15 MED ORDER — PROPOFOL 10 MG/ML IV EMUL
INTRAVENOUS | Status: DC | PRN
  Administered 2012-04-15: 200 mg via INTRAVENOUS

## 2012-04-15 MED ORDER — ONDANSETRON HCL 4 MG/2ML IJ SOLN
INTRAMUSCULAR | Status: DC | PRN
  Administered 2012-04-15: 4 mg via INTRAVENOUS

## 2012-04-15 MED ORDER — LACTATED RINGERS IV SOLN
INTRAVENOUS | Status: DC | PRN
  Administered 2012-04-15: 07:00:00 via INTRAVENOUS

## 2012-04-15 MED ORDER — FENTANYL CITRATE 0.05 MG/ML IJ SOLN
INTRAMUSCULAR | Status: DC | PRN
  Administered 2012-04-15: 100 ug via INTRAVENOUS
  Administered 2012-04-15: 50 ug via INTRAVENOUS

## 2012-04-15 MED ORDER — ONDANSETRON HCL 4 MG/2ML IJ SOLN: 4.0000 mg | Freq: Once | INTRAMUSCULAR | Status: DC | PRN

## 2012-04-15 SURGICAL SUPPLY — 54 items
ADH SKN CLS APL DERMABOND .7 (GAUZE/BANDAGES/DRESSINGS)
AIRSTRIP 4 3/4X3 1/4 7185 (GAUZE/BANDAGES/DRESSINGS) IMPLANT
ATTRACTOMAT 16X20 MAGNETIC DRP (DRAPES) IMPLANT
BANDAGE CONFORM 2  STR LF (GAUZE/BANDAGES/DRESSINGS) IMPLANT
BANDAGE GAUZE ELAST BULKY 4 IN (GAUZE/BANDAGES/DRESSINGS) IMPLANT
CANISTER SUCTION 2500CC (MISCELLANEOUS) IMPLANT
CATH ROBINSON RED A/P 16FR (CATHETERS) IMPLANT
CLEANER TIP ELECTROSURG 2X2 (MISCELLANEOUS) ×2 IMPLANT
CLOTH BEACON ORANGE TIMEOUT ST (SAFETY) ×2 IMPLANT
CONT SPEC 4OZ CLIKSEAL STRL BL (MISCELLANEOUS) ×1 IMPLANT
COVER SURGICAL LIGHT HANDLE (MISCELLANEOUS) ×2 IMPLANT
CRADLE DONUT ADULT HEAD (MISCELLANEOUS) IMPLANT
DERMABOND ADVANCED (GAUZE/BANDAGES/DRESSINGS)
DERMABOND ADVANCED .7 DNX12 (GAUZE/BANDAGES/DRESSINGS) ×1 IMPLANT
DRAIN PENROSE 1/4X12 LTX STRL (WOUND CARE) IMPLANT
DRAIN SNY 10 ROU (WOUND CARE) IMPLANT
DRAIN SNY 7 FPER (WOUND CARE) IMPLANT
DRSG EMULSION OIL 3X3 NADH (GAUZE/BANDAGES/DRESSINGS) IMPLANT
ELECT COATED BLADE 2.86 ST (ELECTRODE) ×2 IMPLANT
ELECT NDL TIP 2.8 STRL (NEEDLE) IMPLANT
ELECT NEEDLE TIP 2.8 STRL (NEEDLE) IMPLANT
ELECT REM PT RETURN 9FT ADLT (ELECTROSURGICAL) ×2
ELECTRODE REM PT RTRN 9FT ADLT (ELECTROSURGICAL) ×1 IMPLANT
GAUZE SPONGE 4X4 16PLY XRAY LF (GAUZE/BANDAGES/DRESSINGS) IMPLANT
GLOVE ECLIPSE 6.5 STRL STRAW (GLOVE) ×1 IMPLANT
GLOVE ECLIPSE 7.5 STRL STRAW (GLOVE) ×2 IMPLANT
GLOVE SURG SS PI 6.5 STRL IVOR (GLOVE) ×2 IMPLANT
GOWN STRL NON-REIN LRG LVL3 (GOWN DISPOSABLE) ×4 IMPLANT
GOWN STRL REIN XL XLG (GOWN DISPOSABLE) ×2 IMPLANT
KIT BASIN OR (CUSTOM PROCEDURE TRAY) ×2 IMPLANT
KIT ROOM TURNOVER OR (KITS) ×2 IMPLANT
NDL 25GX 5/8IN NON SAFETY (NEEDLE) IMPLANT
NEEDLE 25GX 5/8IN NON SAFETY (NEEDLE) IMPLANT
NS IRRIG 1000ML POUR BTL (IV SOLUTION) ×2 IMPLANT
PAD ARMBOARD 7.5X6 YLW CONV (MISCELLANEOUS) ×4 IMPLANT
PENCIL FOOT CONTROL (ELECTRODE) ×2 IMPLANT
POUCH STERILIZING 3 X22 (STERILIZATION PRODUCTS) IMPLANT
SPONGE GAUZE 4X4 12PLY (GAUZE/BANDAGES/DRESSINGS) IMPLANT
SUT CHROMIC 4 0 P 3 18 (SUTURE) ×1 IMPLANT
SUT ETHILON 4 0 PS 2 18 (SUTURE) ×1 IMPLANT
SUT ETHILON 5 0 P 3 18 (SUTURE)
SUT NYLON ETHILON 5-0 P-3 1X18 (SUTURE) IMPLANT
SUT SILK 4 0 (SUTURE)
SUT SILK 4-0 18XBRD TIE 12 (SUTURE) ×1 IMPLANT
SWAB COLLECTION DEVICE MRSA (MISCELLANEOUS) IMPLANT
SYR BULB IRRIGATION 50ML (SYRINGE) IMPLANT
SYR TB 1ML LUER SLIP (SYRINGE) IMPLANT
TOWEL OR 17X24 6PK STRL BLUE (TOWEL DISPOSABLE) ×2 IMPLANT
TOWEL OR 17X26 10 PK STRL BLUE (TOWEL DISPOSABLE) ×2 IMPLANT
TOWEL OR NON WOVEN STRL DISP B (DISPOSABLE) ×1 IMPLANT
TRAY ENT MC OR (CUSTOM PROCEDURE TRAY) ×2 IMPLANT
TUBE ANAEROBIC SPECIMEN COL (MISCELLANEOUS) IMPLANT
WATER STERILE IRR 1000ML POUR (IV SOLUTION) IMPLANT
YANKAUER SUCT BULB TIP NO VENT (SUCTIONS) ×2 IMPLANT

## 2012-04-15 NOTE — Transfer of Care (Signed)
Immediate Anesthesia Transfer of Care Note  Patient: Evan [name]  Procedure(s) Performed: Procedure(s) (LRB): EXCISION MASS (Left)  Patient Location: PACU  Anesthesia Type: General  Level of Consciousness: awake, alert  and oriented  Airway & Oxygen Therapy: Patient Spontanous Breathing and Patient connected to nasal cannula oxygen  Post-op Assessment: Report given to PACU RN and Post -op Vital signs reviewed and stable  Post vital signs: Reviewed and stable  Complications: No apparent anesthesia complications

## 2012-04-15 NOTE — Anesthesia Preprocedure Evaluation (Addendum)
Anesthesia Evaluation  Patient identified by MRN, date of birth, ID band Patient awake    Reviewed: Allergy & Precautions, H&P , NPO status , Patient's Chart, lab work & pertinent test results, reviewed documented beta blocker date and time   Airway Mallampati: II  Neck ROM: Full    Dental  (+) Teeth Intact and Dental Advisory Given   Pulmonary sleep apnea and Continuous Positive Airway Pressure Ventilation ,          Cardiovascular hypertension, Pt. on medications and Pt. on home beta blockers + CAD, + Cardiac Stents, + CABG and + Peripheral Vascular Disease     Neuro/Psych Depression    GI/Hepatic GERD-  Medicated and Controlled,  Endo/Other  Type 2, Oral Hypoglycemic Agents  Renal/GU Renal InsufficiencyRenal disease     Musculoskeletal  (+) Arthritis -, Osteoarthritis,    Abdominal   Peds  Hematology negative hematology ROS (+)   Anesthesia Other Findings   Reproductive/Obstetrics                         Anesthesia Physical Anesthesia Plan  ASA: III  Anesthesia Plan: General   Post-op Pain Management:    Induction: Intravenous  Airway Management Planned: Oral ETT  Additional Equipment:   Intra-op Plan:   Post-operative Plan: Extubation in OR  Informed Consent: I have reviewed the patients History and Physical, chart, labs and discussed the procedure including the risks, benefits and alternatives for the proposed anesthesia with the patient or authorized representative who has indicated his/her understanding and acceptance.   Dental advisory given  Plan Discussed with: CRNA, Anesthesiologist and Surgeon  Anesthesia Plan Comments:        Anesthesia Quick Evaluation

## 2012-04-15 NOTE — Anesthesia Procedure Notes (Signed)
Procedure Name: Intubation Date/Time: 04/15/2012 7:46 AM Performed by: Nicholos Johns Pre-anesthesia Checklist: Patient identified, Emergency Drugs available, Suction available, Patient being monitored and Timeout performed Patient Re-evaluated:Patient Re-evaluated prior to inductionOxygen Delivery Method: Circle system utilized Preoxygenation: Pre-oxygenation with 100% oxygen Intubation Type: IV induction Ventilation: Mask ventilation without difficulty Laryngoscope Size: Mac and 4 Grade View: Grade I Tube type: Oral Tube size: 7.5 mm Number of attempts: 1 Airway Equipment and Method: Stylet Placement Confirmation: ETT inserted through vocal cords under direct vision,  positive ETCO2 and breath sounds checked- equal and bilateral Secured at: 22 cm Tube secured with: Tape Dental Injury: Teeth and Oropharynx as per pre-operative assessment

## 2012-04-15 NOTE — Anesthesia Postprocedure Evaluation (Signed)
  Anesthesia Post-op Note  Patient: Evan [name]  Procedure(s) Performed: Procedure(s) (LRB): EXCISION MASS (Left)  Patient Location: PACU  Anesthesia Type: General  Level of Consciousness: awake, alert , oriented and patient cooperative  Airway and Oxygen Therapy: Patient Spontanous Breathing and Patient connected to nasal cannula oxygen  Post-op Pain: none  Post-op Assessment: Post-op Vital signs reviewed, Patient's Cardiovascular Status Stable, Respiratory Function Stable, Patent Airway, No signs of Nausea or vomiting and Pain level controlled  Post-op Vital Signs: stable  Complications: No apparent anesthesia complications

## 2012-04-15 NOTE — Op Note (Signed)
Preop/postop diagnosis: Left tonsil mass Procedure: Left tonsil biopsy Anesthesia: Gen. Estimated blood loss: Less than 51 cc 66 year old who's had a mass/ulcer in the left upper pole of the tonsil that has been present for an undetermined amount of time. He was seen in April and identified this mass and told to have a biopsy. He elected to wait until he saw his oncologist at [facility] before consenting to a biopsy. He now has seen the oncologist to apparently agrees with the concern of the appearance of this mass. He was informed risks, benefits, and options of the procedure. The procedure was discussed. All his questions are answered and consent was obtained. Operation: Patient was taken to the operating room placed in the supine position after general endotracheal tube anesthesia the Crowe-Davis mouth gag inserted retracted and suspended from the [employer] stand. The left tonsil had a crater-like appearing lesion with an ulcerative base that was about 1 cm in size. This area was dissected with scissors and pickups and removed. The base was cauterized with electrocautery. There was good hemostasis. There was no lesions identified in anywhere else in the oropharynx oral cavity. The Crowe-Davis was removed the patient was awake and brought to cover stable condition counts correct

## 2012-04-15 NOTE — H&P (Signed)
Evan [name] is an 66 y.o. male.   Chief Complaint: left tonsil mass  HPI: here for left tonsil mass biopsy. He had elected not to have this back in April until he saw his oncologist knowing the risk of being cancer  Past Medical History  Diagnosis Date  . Coronary artery disease   . Cancer   . Diabetes mellitus   . Depression   . Pulmonary emboli   . Hypercholesteremia   . Kidney stone   . Hypertension     mark skains  . Chronic renal insufficiency     Past Surgical History  Procedure Date  . Coronary artery bypass graft     1999  . Melanomia     several skin ca removed  . Cardiac catheterization     x3     last 1999    History reviewed. No pertinent family history. Social History:  reports that he has never smoked. He does not have any smokeless tobacco history on file. He reports that he does not drink alcohol or use illicit drugs.  Allergies:  Allergies  Allergen Reactions  . Penicillins Rash    Medications Prior to Admission  Medication Sig Dispense Refill  . amLODipine (NORVASC) 5 MG tablet Take 5 mg by mouth every evening.      [name] aspirin EC 81 MG tablet Take 81 mg by mouth every evening.      . cholecalciferol (VITAMIN D) 1000 UNITS tablet Take 1,000 Units by mouth every morning.      . citalopram (CELEXA) 40 MG tablet Take 40 mg by mouth every morning.      [name] co-enzyme Q-10 50 MG capsule Take 100 mg by mouth every morning.      . Cranberry 500 MG CAPS Take 500 mg by mouth every morning.      [name] esomeprazole (NEXIUM) 40 MG capsule Take 40 mg by mouth every morning.      . fenofibrate micronized (LOFIBRA) 67 MG capsule Take 67 mg by mouth every morning.      [name] FLAXSEED, LINSEED, PO Take 2 tablets by mouth 2 (two) times daily.      [name] gabapentin (NEURONTIN) 100 MG capsule Take 100 mg by mouth 2 (two) times daily.      [name] glyBURIDE (DIABETA) 5 MG tablet Take 5 mg by mouth every evening.      . metFORMIN (GLUCOPHAGE) 1000 MG tablet Take 1,000 mg by mouth 2  (two) times daily with a meal.      . metoprolol succinate (TOPROL-XL) 50 MG 24 hr tablet Take 50 mg by mouth every morning. Take with or immediately following a meal.      . Multiple Vitamin (MULITIVITAMIN WITH MINERALS) TABS Take 1 tablet by mouth every evening.      [name] omega-3 acid ethyl esters (LOVAZA) 1 G capsule Take 2 g by mouth 2 (two) times daily.      [name] OVER THE COUNTER MEDICATION Take 1 tablet by mouth every morning. Folic Acid with DHA 600 MCG      . OVER THE COUNTER MEDICATION Take 1 tablet by mouth every morning. Grape seed with Resveratol      . pioglitazone (ACTOS) 30 MG tablet Take 30 mg by mouth every morning.      . Potassium 99 MG TABS Take 99 mg by mouth every morning.      . ranolazine (RANEXA) 500 MG 12 hr tablet Take 500 mg by mouth 2 (two) times daily.      [name]  rosuvastatin (CRESTOR) 20 MG tablet Take 20 mg by mouth every evening.      . Tamsulosin HCl (FLOMAX) 0.4 MG CAPS Take 0.4 mg by mouth every evening.      . testosterone cypionate (DEPOTESTOTERONE CYPIONATE) 100 MG/ML injection Inject 150 mg into the muscle every 14 (fourteen) days. For IM use only      . valsartan (DIOVAN) 160 MG tablet Take 80 mg by mouth every morning.      . vardenafil (LEVITRA) 20 MG tablet Take 20 mg by mouth daily as needed. For E.D.      . vitamin B-12 (CYANOCOBALAMIN) 250 MCG tablet Take 250 mcg by mouth every morning.      . vitamin C (ASCORBIC ACID) 500 MG tablet Take 1,000 mg by mouth every morning.      . warfarin (COUMADIN) 5 MG tablet Take 5 mg by mouth every evening.        Results for orders placed during the hospital encounter of 04/15/12 (from the past 48 hour(s))  GLUCOSE, CAPILLARY     Status: Abnormal   Collection Time   04/15/12  6:30 AM      Component Value Range Comment   Glucose-Capillary 173 (*) 70 - 99 mg/dL    Dg Chest 2 View  1/61/0960  *RADIOLOGY REPORT*  Clinical Data: Left tonsil biopsy/mass.  Coronary artery disease. History of pulmonary emboli.  CHEST - 2 VIEW   Comparison: None.  Findings: Mild hyperinflation. Prior median sternotomy. Midline trachea.  Mild cardiomegaly. Apparent superior mediastinal soft tissue fullness is favored to be due to apical lordotic frontal view.  No pleural effusion or pneumothorax.  Surgical clips in the left side of the neck.  Minimal pleural thickening bilaterally. No congestive failure.  Clear lungs.  IMPRESSION: Cardiomegaly, without acute disease.  Original Report Authenticated By: Consuello Bossier, M.D.    Review of Systems  Constitutional: Negative.   HENT: Negative.   Skin: Negative.     Blood pressure 131/82, pulse 77, temperature 97.9 F (36.6 C), temperature source Oral, resp. rate 18, SpO2 95.00%. Physical Exam  Constitutional: He appears well-nourished.  HENT:  Nose: Nose normal.  Eyes: Pupils are equal, round, and reactive to light.  Neck: Normal range of motion.  Cardiovascular: Normal rate.   Respiratory: Effort normal.     Assessment/Plan Left tonsil mass- here for biopsy  Suzanna Obey 04/15/2012, 7:27 AM

## 2012-04-15 NOTE — Preoperative (Signed)
Beta Blockers   Reason not to administer Beta Blockers:Not Applicable, taken this am per pt

## 2012-04-16 ENCOUNTER — Encounter (HOSPITAL_COMMUNITY): Payer: Self-pay | Admitting: Otolaryngology

## 2012-04-16 DIAGNOSIS — Z7901 Long term (current) use of anticoagulants: Secondary | ICD-10-CM | POA: Diagnosis not present

## 2012-04-30 DIAGNOSIS — E291 Testicular hypofunction: Secondary | ICD-10-CM | POA: Diagnosis not present

## 2012-06-11 DIAGNOSIS — Z7901 Long term (current) use of anticoagulants: Secondary | ICD-10-CM | POA: Diagnosis not present

## 2012-06-11 DIAGNOSIS — E291 Testicular hypofunction: Secondary | ICD-10-CM | POA: Diagnosis not present

## 2012-06-24 DIAGNOSIS — L738 Other specified follicular disorders: Secondary | ICD-10-CM | POA: Diagnosis not present

## 2012-06-24 DIAGNOSIS — D235 Other benign neoplasm of skin of trunk: Secondary | ICD-10-CM | POA: Diagnosis not present

## 2012-06-24 DIAGNOSIS — L570 Actinic keratosis: Secondary | ICD-10-CM | POA: Diagnosis not present

## 2012-06-24 DIAGNOSIS — Z85820 Personal history of malignant melanoma of skin: Secondary | ICD-10-CM | POA: Diagnosis not present

## 2012-07-23 DIAGNOSIS — E291 Testicular hypofunction: Secondary | ICD-10-CM | POA: Diagnosis not present

## 2012-08-06 DIAGNOSIS — E291 Testicular hypofunction: Secondary | ICD-10-CM | POA: Diagnosis not present

## 2012-08-13 DIAGNOSIS — I6529 Occlusion and stenosis of unspecified carotid artery: Secondary | ICD-10-CM | POA: Diagnosis not present

## 2012-08-20 DIAGNOSIS — E291 Testicular hypofunction: Secondary | ICD-10-CM | POA: Diagnosis not present

## 2012-08-20 DIAGNOSIS — Z7901 Long term (current) use of anticoagulants: Secondary | ICD-10-CM | POA: Diagnosis not present

## 2012-09-03 DIAGNOSIS — E291 Testicular hypofunction: Secondary | ICD-10-CM | POA: Diagnosis not present

## 2012-09-16 DIAGNOSIS — D485 Neoplasm of uncertain behavior of skin: Secondary | ICD-10-CM | POA: Diagnosis not present

## 2012-09-16 DIAGNOSIS — D235 Other benign neoplasm of skin of trunk: Secondary | ICD-10-CM | POA: Diagnosis not present

## 2012-09-16 DIAGNOSIS — L570 Actinic keratosis: Secondary | ICD-10-CM | POA: Diagnosis not present

## 2012-09-16 DIAGNOSIS — Z85820 Personal history of malignant melanoma of skin: Secondary | ICD-10-CM | POA: Diagnosis not present

## 2012-09-17 DIAGNOSIS — Z7901 Long term (current) use of anticoagulants: Secondary | ICD-10-CM | POA: Diagnosis not present

## 2012-09-17 DIAGNOSIS — E291 Testicular hypofunction: Secondary | ICD-10-CM | POA: Diagnosis not present

## 2012-09-20 DIAGNOSIS — E119 Type 2 diabetes mellitus without complications: Secondary | ICD-10-CM | POA: Diagnosis not present

## 2012-09-20 DIAGNOSIS — H2510 Age-related nuclear cataract, unspecified eye: Secondary | ICD-10-CM | POA: Diagnosis not present

## 2012-10-01 DIAGNOSIS — E291 Testicular hypofunction: Secondary | ICD-10-CM | POA: Diagnosis not present

## 2012-10-13 DIAGNOSIS — L259 Unspecified contact dermatitis, unspecified cause: Secondary | ICD-10-CM | POA: Diagnosis not present

## 2012-10-25 DIAGNOSIS — IMO0002 Reserved for concepts with insufficient information to code with codable children: Secondary | ICD-10-CM | POA: Diagnosis not present

## 2012-10-25 DIAGNOSIS — Z7901 Long term (current) use of anticoagulants: Secondary | ICD-10-CM | POA: Diagnosis not present

## 2012-10-25 DIAGNOSIS — E291 Testicular hypofunction: Secondary | ICD-10-CM | POA: Diagnosis not present

## 2012-11-26 DIAGNOSIS — E291 Testicular hypofunction: Secondary | ICD-10-CM | POA: Diagnosis not present

## 2012-11-26 DIAGNOSIS — Z7901 Long term (current) use of anticoagulants: Secondary | ICD-10-CM | POA: Diagnosis not present

## 2012-12-10 DIAGNOSIS — E291 Testicular hypofunction: Secondary | ICD-10-CM | POA: Diagnosis not present

## 2012-12-10 DIAGNOSIS — L570 Actinic keratosis: Secondary | ICD-10-CM | POA: Diagnosis not present

## 2012-12-17 DIAGNOSIS — I2510 Atherosclerotic heart disease of native coronary artery without angina pectoris: Secondary | ICD-10-CM | POA: Diagnosis not present

## 2012-12-17 DIAGNOSIS — E7800 Pure hypercholesterolemia, unspecified: Secondary | ICD-10-CM | POA: Diagnosis not present

## 2012-12-17 DIAGNOSIS — I2699 Other pulmonary embolism without acute cor pulmonale: Secondary | ICD-10-CM | POA: Diagnosis not present

## 2012-12-17 DIAGNOSIS — T81718A Complication of other artery following a procedure, not elsewhere classified, initial encounter: Secondary | ICD-10-CM | POA: Diagnosis not present

## 2012-12-24 DIAGNOSIS — E291 Testicular hypofunction: Secondary | ICD-10-CM | POA: Diagnosis not present

## 2013-01-07 DIAGNOSIS — E291 Testicular hypofunction: Secondary | ICD-10-CM | POA: Diagnosis not present

## 2013-01-21 DIAGNOSIS — T81718A Complication of other artery following a procedure, not elsewhere classified, initial encounter: Secondary | ICD-10-CM | POA: Diagnosis not present

## 2013-01-21 DIAGNOSIS — Z7901 Long term (current) use of anticoagulants: Secondary | ICD-10-CM | POA: Diagnosis not present

## 2013-01-21 DIAGNOSIS — I2699 Other pulmonary embolism without acute cor pulmonale: Secondary | ICD-10-CM | POA: Diagnosis not present

## 2013-01-21 DIAGNOSIS — H579 Unspecified disorder of eye and adnexa: Secondary | ICD-10-CM | POA: Diagnosis not present

## 2013-01-21 DIAGNOSIS — E291 Testicular hypofunction: Secondary | ICD-10-CM | POA: Diagnosis not present

## 2013-01-30 ENCOUNTER — Emergency Department (HOSPITAL_COMMUNITY)
Admission: EM | Admit: 2013-01-30 | Discharge: 2013-01-30 | Disposition: A | Discharge status: Home / Self Care | Payer: 59 | Attending: Emergency Medicine | Admitting: Emergency Medicine

## 2013-01-30 ENCOUNTER — Encounter (HOSPITAL_COMMUNITY): Payer: Self-pay | Admitting: Family Medicine

## 2013-01-30 DIAGNOSIS — N189 Chronic kidney disease, unspecified: Secondary | ICD-10-CM | POA: Insufficient documentation

## 2013-01-30 DIAGNOSIS — Z880 Allergy status to penicillin: Secondary | ICD-10-CM | POA: Insufficient documentation

## 2013-01-30 DIAGNOSIS — R110 Nausea: Secondary | ICD-10-CM | POA: Diagnosis not present

## 2013-01-30 DIAGNOSIS — I2510 Atherosclerotic heart disease of native coronary artery without angina pectoris: Secondary | ICD-10-CM | POA: Diagnosis not present

## 2013-01-30 DIAGNOSIS — R109 Unspecified abdominal pain: Secondary | ICD-10-CM | POA: Insufficient documentation

## 2013-01-30 DIAGNOSIS — Z9889 Other specified postprocedural states: Secondary | ICD-10-CM | POA: Insufficient documentation

## 2013-01-30 DIAGNOSIS — Z85828 Personal history of other malignant neoplasm of skin: Secondary | ICD-10-CM | POA: Insufficient documentation

## 2013-01-30 DIAGNOSIS — Z951 Presence of aortocoronary bypass graft: Secondary | ICD-10-CM | POA: Insufficient documentation

## 2013-01-30 DIAGNOSIS — Z87442 Personal history of urinary calculi: Secondary | ICD-10-CM | POA: Diagnosis not present

## 2013-01-30 DIAGNOSIS — R319 Hematuria, unspecified: Secondary | ICD-10-CM | POA: Diagnosis not present

## 2013-01-30 DIAGNOSIS — Z86711 Personal history of pulmonary embolism: Secondary | ICD-10-CM | POA: Diagnosis not present

## 2013-01-30 DIAGNOSIS — Z7901 Long term (current) use of anticoagulants: Secondary | ICD-10-CM | POA: Diagnosis not present

## 2013-01-30 DIAGNOSIS — F329 Major depressive disorder, single episode, unspecified: Secondary | ICD-10-CM | POA: Diagnosis not present

## 2013-01-30 DIAGNOSIS — I129 Hypertensive chronic kidney disease with stage 1 through stage 4 chronic kidney disease, or unspecified chronic kidney disease: Secondary | ICD-10-CM | POA: Insufficient documentation

## 2013-01-30 DIAGNOSIS — Z79899 Other long term (current) drug therapy: Secondary | ICD-10-CM | POA: Insufficient documentation

## 2013-01-30 DIAGNOSIS — E7800 Pure hypercholesterolemia, unspecified: Secondary | ICD-10-CM | POA: Insufficient documentation

## 2013-01-30 DIAGNOSIS — F3289 Other specified depressive episodes: Secondary | ICD-10-CM | POA: Insufficient documentation

## 2013-01-30 DIAGNOSIS — E119 Type 2 diabetes mellitus without complications: Secondary | ICD-10-CM | POA: Insufficient documentation

## 2013-01-30 DIAGNOSIS — Z7982 Long term (current) use of aspirin: Secondary | ICD-10-CM | POA: Insufficient documentation

## 2013-01-30 LAB — URINALYSIS, ROUTINE W REFLEX MICROSCOPIC
Bilirubin Urine: NEGATIVE
Ketones, ur: 15 mg/dL — AB
Nitrite: NEGATIVE
Specific Gravity, Urine: 1.028 (ref 1.005–1.030)
Urobilinogen, UA: 0.2 mg/dL (ref 0.0–1.0)

## 2013-01-30 MED ORDER — OXYCODONE-ACETAMINOPHEN 5-325 MG PO TABS: 1.0000 | ORAL_TABLET | Freq: Four times a day (QID) | ORAL | Status: DC | PRN

## 2013-01-30 NOTE — ED Provider Notes (Signed)
History     CSN: [id number]  Arrival date & time 01/30/13  1827   First MD Initiated Contact with Patient 01/30/13 1912      Chief Complaint  Patient presents with  . Flank Pain    (Consider location/radiation/quality/duration/timing/severity/associated sxs/prior treatment) Patient is a 67 y.o. male presenting with flank pain. The history is provided by the patient.  Flank Pain This is a recurrent problem. Pertinent negatives include no chest pain, no abdominal pain, no headaches and no shortness of breath.   patient with left groin pain that radiated up to left flank. It up and going since 11:00 last night but this resolved on the way here. He had some nausea. The nausea is resolved also. No fevers. No cough. He had some mild diarrhea. No dysuria. He is on Coumadin and levels are being monitored. He states his renal function is depressed as ever been.  Past Medical History  Diagnosis Date  . Coronary artery disease   . Cancer   . Diabetes mellitus   . Depression   . Pulmonary emboli   . Hypercholesteremia   . Kidney stone   . Hypertension     mark skains  . Chronic renal insufficiency     Past Surgical History  Procedure Laterality Date  . Coronary artery bypass graft      1999  . Melanomia      several skin ca removed  . Cardiac catheterization      x3     last 1999  . Mass excision  04/15/2012    Procedure: EXCISION MASS;  Surgeon: Suzanna Obey, MD;  Location: [facility] OR;  Service: ENT;  Laterality: Left;  Left tonsil biopsy    History reviewed. No pertinent family history.  History  Substance Use Topics  . Smoking status: Never Smoker   . Smokeless tobacco: Not on file  . Alcohol Use: No      Review of Systems  Constitutional: Negative for activity change and appetite change.  HENT: Negative for neck stiffness.   Eyes: Negative for pain.  Respiratory: Negative for chest tightness and shortness of breath.   Cardiovascular: Negative for chest pain and leg  swelling.  Gastrointestinal: Positive for nausea. Negative for vomiting, abdominal pain and diarrhea.  Genitourinary: Positive for flank pain. Negative for hematuria.  Musculoskeletal: Negative for back pain.  Skin: Negative for rash.  Neurological: Negative for weakness, numbness and headaches.  Psychiatric/Behavioral: Negative for behavioral problems.    Allergies  Penicillins  Home Medications   Current Outpatient Rx  Name  Route  Sig  Dispense  Refill  . amLODipine (NORVASC) 5 MG tablet   Oral   Take 5 mg by mouth every evening.         [name] aspirin EC 81 MG tablet   Oral   Take 81 mg by mouth every evening.         . cholecalciferol (VITAMIN D) 1000 UNITS tablet   Oral   Take 1,000 Units by mouth every morning.         . citalopram (CELEXA) 40 MG tablet   Oral   Take 40 mg by mouth every morning.         [name] co-enzyme Q-10 50 MG capsule   Oral   Take 100 mg by mouth every morning.         . Cranberry 500 MG CAPS   Oral   Take 500 mg by mouth every morning.         [name]  esomeprazole (NEXIUM) 40 MG capsule   Oral   Take 40 mg by mouth every morning.         . fenofibrate micronized (LOFIBRA) 67 MG capsule   Oral   Take 67 mg by mouth every morning.         [name] FLAXSEED, LINSEED, PO   Oral   Take 2 tablets by mouth 2 (two) times daily.         [name] gabapentin (NEURONTIN) 100 MG capsule   Oral   Take 100 mg by mouth 2 (two) times daily.         [name] glyBURIDE (DIABETA) 5 MG tablet   Oral   Take 5 mg by mouth every evening.         . metFORMIN (GLUCOPHAGE) 1000 MG tablet   Oral   Take 1,000 mg by mouth 2 (two) times daily with a meal.         . metoprolol succinate (TOPROL-XL) 50 MG 24 hr tablet   Oral   Take 50 mg by mouth every morning. Take with or immediately following a meal.         . Multiple Vitamin (MULITIVITAMIN WITH MINERALS) TABS   Oral   Take 1 tablet by mouth every evening.         [name] omega-3 acid ethyl esters (LOVAZA) 1  G capsule   Oral   Take 2 g by mouth 2 (two) times daily.         [name] OVER THE COUNTER MEDICATION   Oral   Take 1 tablet by mouth every morning. Folic Acid with DHA 600 MCG         . OVER THE COUNTER MEDICATION   Oral   Take 1 tablet by mouth every morning. Grape seed with Resveratol         . pioglitazone (ACTOS) 30 MG tablet   Oral   Take 30 mg by mouth every morning.         . Potassium 99 MG TABS   Oral   Take 99 mg by mouth every morning.         . ranolazine (RANEXA) 500 MG 12 hr tablet   Oral   Take 500 mg by mouth 2 (two) times daily.         . rosuvastatin (CRESTOR) 20 MG tablet   Oral   Take 20 mg by mouth every evening.         . Tamsulosin HCl (FLOMAX) 0.4 MG CAPS   Oral   Take 0.4 mg by mouth every evening.         . testosterone cypionate (DEPOTESTOTERONE CYPIONATE) 100 MG/ML injection   Intramuscular   Inject 150 mg into the muscle every 14 (fourteen) days. For IM use only         . valsartan (DIOVAN) 160 MG tablet   Oral   Take 80 mg by mouth every morning.         . vardenafil (LEVITRA) 20 MG tablet   Oral   Take 20 mg by mouth daily as needed. For E.D.         . vitamin B-12 (CYANOCOBALAMIN) 250 MCG tablet   Oral   Take 250 mcg by mouth every morning.         . vitamin C (ASCORBIC ACID) 500 MG tablet   Oral   Take 1,000 mg by mouth every morning.         . warfarin (COUMADIN)  5 MG tablet   Oral   Take 5-7.5 mg by mouth every evening. M,W,F 7.5mg  Rest of week 5mg          . oxyCODONE-acetaminophen (PERCOCET/ROXICET) 5-325 MG per tablet   Oral   Take 1-2 tablets by mouth every 6 (six) hours as needed for pain.   10 tablet   0     BP 111/62  Pulse 90  Temp(Src) 98.2 F (36.8 C) (Oral)  Resp 20  SpO2 94%  Physical Exam  Nursing note and vitals reviewed. Constitutional: He is oriented to person, place, and time. He appears well-developed and well-nourished.  HENT:  Head: Normocephalic and atraumatic.   Eyes: EOM are normal. Pupils are equal, round, and reactive to light.  Neck: Normal range of motion. Neck supple.  Cardiovascular: Normal rate, regular rhythm and normal heart sounds.   No murmur heard. Pulmonary/Chest: Effort normal and breath sounds normal.  Abdominal: Soft. Bowel sounds are normal. He exhibits no distension and no mass. There is no tenderness. There is no rebound and no guarding.  Genitourinary: Penis normal.  No testicular masses. No tenderness. No hernias.  Musculoskeletal: Normal range of motion. He exhibits no edema.  Neurological: He is alert and oriented to person, place, and time. No cranial nerve deficit.  Skin: Skin is warm and dry.  Psychiatric: He has a normal mood and affect.    ED Course  Procedures (including critical care time)  Labs Reviewed  URINALYSIS, ROUTINE W REFLEX MICROSCOPIC - Abnormal; Notable for the following:    APPearance CLOUDY (*)    Glucose, UA >1000 (*)    Hgb urine dipstick LARGE (*)    Ketones, ur 15 (*)    Protein, ur >300 (*)    All other components within normal limits  URINE MICROSCOPIC-ADD ON - Abnormal; Notable for the following:    Squamous Epithelial / LPF FEW (*)    Bacteria, UA FEW (*)    All other components within normal limits  URINE CULTURE   No results found.   1. Flank pain   2. Hematuria       MDM  Patient presents with left flank pain. It is since resolved. He has some blood in the urine. Sugars have been good at home. He has hematuria. His INRs been monitored as an outpatient have been good. No fevers. His sugars have been good. He has a previous cyst on CT. After discussion radiology I would just need to be followed. Patient's been instructed to return for fevers. He'll return for sugars out of control. Urine culture is sent. He'll also return for increased bleeding.        Juliet Rude. Rubin Payor, MD 01/30/13 2007

## 2013-01-30 NOTE — ED Notes (Signed)
Per pt sts left flank pain and left groin pain. sts hx of kidney stones. sts also some nausea and diarrhea.

## 2013-02-01 LAB — URINE CULTURE: Culture: NO GROWTH

## 2013-02-04 DIAGNOSIS — E291 Testicular hypofunction: Secondary | ICD-10-CM | POA: Diagnosis not present

## 2013-02-18 DIAGNOSIS — Z7901 Long term (current) use of anticoagulants: Secondary | ICD-10-CM | POA: Diagnosis not present

## 2013-02-18 DIAGNOSIS — E291 Testicular hypofunction: Secondary | ICD-10-CM | POA: Diagnosis not present

## 2013-03-04 DIAGNOSIS — E291 Testicular hypofunction: Secondary | ICD-10-CM | POA: Diagnosis not present

## 2013-03-18 DIAGNOSIS — E291 Testicular hypofunction: Secondary | ICD-10-CM | POA: Diagnosis not present

## 2013-03-18 DIAGNOSIS — Z7901 Long term (current) use of anticoagulants: Secondary | ICD-10-CM | POA: Diagnosis not present

## 2013-03-31 DIAGNOSIS — C4370 Malignant melanoma of unspecified lower limb, including hip: Secondary | ICD-10-CM | POA: Diagnosis not present

## 2013-03-31 DIAGNOSIS — C4330 Malignant melanoma of unspecified part of face: Secondary | ICD-10-CM | POA: Diagnosis not present

## 2013-04-08 DIAGNOSIS — E291 Testicular hypofunction: Secondary | ICD-10-CM | POA: Diagnosis not present

## 2013-04-22 DIAGNOSIS — Z7901 Long term (current) use of anticoagulants: Secondary | ICD-10-CM | POA: Diagnosis not present

## 2013-04-22 DIAGNOSIS — E291 Testicular hypofunction: Secondary | ICD-10-CM | POA: Diagnosis not present

## 2013-04-28 ENCOUNTER — Other Ambulatory Visit: Payer: Self-pay | Admitting: Dermatology

## 2013-04-28 DIAGNOSIS — C44320 Squamous cell carcinoma of skin of unspecified parts of face: Secondary | ICD-10-CM | POA: Diagnosis not present

## 2013-04-28 DIAGNOSIS — L738 Other specified follicular disorders: Secondary | ICD-10-CM | POA: Diagnosis not present

## 2013-05-06 DIAGNOSIS — E291 Testicular hypofunction: Secondary | ICD-10-CM | POA: Diagnosis not present

## 2013-05-20 DIAGNOSIS — E291 Testicular hypofunction: Secondary | ICD-10-CM | POA: Diagnosis not present

## 2013-05-20 DIAGNOSIS — Z7901 Long term (current) use of anticoagulants: Secondary | ICD-10-CM | POA: Diagnosis not present

## 2013-06-03 DIAGNOSIS — E291 Testicular hypofunction: Secondary | ICD-10-CM | POA: Diagnosis not present

## 2013-06-10 DIAGNOSIS — L570 Actinic keratosis: Secondary | ICD-10-CM | POA: Diagnosis not present

## 2013-06-10 DIAGNOSIS — Z85828 Personal history of other malignant neoplasm of skin: Secondary | ICD-10-CM | POA: Diagnosis not present

## 2013-06-10 DIAGNOSIS — L820 Inflamed seborrheic keratosis: Secondary | ICD-10-CM | POA: Diagnosis not present

## 2013-06-10 DIAGNOSIS — Z85820 Personal history of malignant melanoma of skin: Secondary | ICD-10-CM | POA: Diagnosis not present

## 2013-06-17 DIAGNOSIS — Z7901 Long term (current) use of anticoagulants: Secondary | ICD-10-CM | POA: Diagnosis not present

## 2013-06-17 DIAGNOSIS — E291 Testicular hypofunction: Secondary | ICD-10-CM | POA: Diagnosis not present

## 2013-07-01 DIAGNOSIS — E291 Testicular hypofunction: Secondary | ICD-10-CM | POA: Diagnosis not present

## 2013-07-15 DIAGNOSIS — I10 Essential (primary) hypertension: Secondary | ICD-10-CM | POA: Diagnosis not present

## 2013-07-15 DIAGNOSIS — L820 Inflamed seborrheic keratosis: Secondary | ICD-10-CM | POA: Diagnosis not present

## 2013-07-15 DIAGNOSIS — Z0000 Encounter for general adult medical examination without abnormal findings: Secondary | ICD-10-CM | POA: Diagnosis not present

## 2013-07-15 DIAGNOSIS — Z23 Encounter for immunization: Secondary | ICD-10-CM | POA: Diagnosis not present

## 2013-07-15 DIAGNOSIS — E1149 Type 2 diabetes mellitus with other diabetic neurological complication: Secondary | ICD-10-CM | POA: Diagnosis not present

## 2013-07-15 DIAGNOSIS — E559 Vitamin D deficiency, unspecified: Secondary | ICD-10-CM | POA: Diagnosis not present

## 2013-07-15 DIAGNOSIS — E1142 Type 2 diabetes mellitus with diabetic polyneuropathy: Secondary | ICD-10-CM | POA: Diagnosis not present

## 2013-07-15 DIAGNOSIS — E291 Testicular hypofunction: Secondary | ICD-10-CM | POA: Diagnosis not present

## 2013-07-15 DIAGNOSIS — Z125 Encounter for screening for malignant neoplasm of prostate: Secondary | ICD-10-CM | POA: Diagnosis not present

## 2013-07-15 DIAGNOSIS — M109 Gout, unspecified: Secondary | ICD-10-CM | POA: Diagnosis not present

## 2013-07-29 DIAGNOSIS — E291 Testicular hypofunction: Secondary | ICD-10-CM | POA: Diagnosis not present

## 2013-08-12 DIAGNOSIS — E782 Mixed hyperlipidemia: Secondary | ICD-10-CM | POA: Diagnosis not present

## 2013-08-12 DIAGNOSIS — Z7901 Long term (current) use of anticoagulants: Secondary | ICD-10-CM | POA: Diagnosis not present

## 2013-08-12 DIAGNOSIS — E291 Testicular hypofunction: Secondary | ICD-10-CM | POA: Diagnosis not present

## 2013-08-12 DIAGNOSIS — I708 Atherosclerosis of other arteries: Secondary | ICD-10-CM | POA: Diagnosis not present

## 2013-08-12 DIAGNOSIS — I6529 Occlusion and stenosis of unspecified carotid artery: Secondary | ICD-10-CM | POA: Diagnosis not present

## 2013-08-15 DIAGNOSIS — E291 Testicular hypofunction: Secondary | ICD-10-CM | POA: Diagnosis not present

## 2013-08-15 DIAGNOSIS — E1129 Type 2 diabetes mellitus with other diabetic kidney complication: Secondary | ICD-10-CM | POA: Diagnosis not present

## 2013-08-15 DIAGNOSIS — E1142 Type 2 diabetes mellitus with diabetic polyneuropathy: Secondary | ICD-10-CM | POA: Diagnosis not present

## 2013-08-15 DIAGNOSIS — I10 Essential (primary) hypertension: Secondary | ICD-10-CM | POA: Diagnosis not present

## 2013-08-15 DIAGNOSIS — E782 Mixed hyperlipidemia: Secondary | ICD-10-CM | POA: Diagnosis not present

## 2013-08-15 DIAGNOSIS — Z7901 Long term (current) use of anticoagulants: Secondary | ICD-10-CM | POA: Diagnosis not present

## 2013-09-02 DIAGNOSIS — E291 Testicular hypofunction: Secondary | ICD-10-CM | POA: Diagnosis not present

## 2013-09-09 DIAGNOSIS — L570 Actinic keratosis: Secondary | ICD-10-CM | POA: Diagnosis not present

## 2013-09-09 DIAGNOSIS — L821 Other seborrheic keratosis: Secondary | ICD-10-CM | POA: Diagnosis not present

## 2013-09-09 DIAGNOSIS — D485 Neoplasm of uncertain behavior of skin: Secondary | ICD-10-CM | POA: Diagnosis not present

## 2013-09-12 DIAGNOSIS — J069 Acute upper respiratory infection, unspecified: Secondary | ICD-10-CM | POA: Diagnosis not present

## 2013-09-16 DIAGNOSIS — Z86711 Personal history of pulmonary embolism: Secondary | ICD-10-CM | POA: Diagnosis not present

## 2013-09-16 DIAGNOSIS — Z7901 Long term (current) use of anticoagulants: Secondary | ICD-10-CM | POA: Diagnosis not present

## 2013-09-16 DIAGNOSIS — E291 Testicular hypofunction: Secondary | ICD-10-CM | POA: Diagnosis not present

## 2013-09-30 DIAGNOSIS — E291 Testicular hypofunction: Secondary | ICD-10-CM | POA: Diagnosis not present

## 2013-10-04 ENCOUNTER — Other Ambulatory Visit: Payer: Self-pay | Admitting: Cardiology

## 2013-10-14 DIAGNOSIS — Z86711 Personal history of pulmonary embolism: Secondary | ICD-10-CM | POA: Diagnosis not present

## 2013-10-14 DIAGNOSIS — E291 Testicular hypofunction: Secondary | ICD-10-CM | POA: Diagnosis not present

## 2013-10-14 DIAGNOSIS — Z7901 Long term (current) use of anticoagulants: Secondary | ICD-10-CM | POA: Diagnosis not present

## 2013-10-17 DIAGNOSIS — I10 Essential (primary) hypertension: Secondary | ICD-10-CM | POA: Diagnosis not present

## 2013-10-17 DIAGNOSIS — IMO0001 Reserved for inherently not codable concepts without codable children: Secondary | ICD-10-CM | POA: Diagnosis not present

## 2013-10-17 DIAGNOSIS — E1129 Type 2 diabetes mellitus with other diabetic kidney complication: Secondary | ICD-10-CM | POA: Diagnosis not present

## 2013-10-17 DIAGNOSIS — E782 Mixed hyperlipidemia: Secondary | ICD-10-CM | POA: Diagnosis not present

## 2013-10-17 DIAGNOSIS — E669 Obesity, unspecified: Secondary | ICD-10-CM | POA: Diagnosis not present

## 2013-10-17 DIAGNOSIS — N411 Chronic prostatitis: Secondary | ICD-10-CM | POA: Diagnosis not present

## 2013-10-17 DIAGNOSIS — R300 Dysuria: Secondary | ICD-10-CM | POA: Diagnosis not present

## 2013-10-17 DIAGNOSIS — G4730 Sleep apnea, unspecified: Secondary | ICD-10-CM | POA: Diagnosis not present

## 2013-10-28 DIAGNOSIS — Z7901 Long term (current) use of anticoagulants: Secondary | ICD-10-CM | POA: Diagnosis not present

## 2013-10-28 DIAGNOSIS — E7800 Pure hypercholesterolemia, unspecified: Secondary | ICD-10-CM | POA: Diagnosis not present

## 2013-10-28 DIAGNOSIS — E291 Testicular hypofunction: Secondary | ICD-10-CM | POA: Diagnosis not present

## 2013-10-28 DIAGNOSIS — Z86718 Personal history of other venous thrombosis and embolism: Secondary | ICD-10-CM | POA: Diagnosis not present

## 2013-11-07 DIAGNOSIS — E119 Type 2 diabetes mellitus without complications: Secondary | ICD-10-CM | POA: Diagnosis not present

## 2013-11-07 DIAGNOSIS — H2510 Age-related nuclear cataract, unspecified eye: Secondary | ICD-10-CM | POA: Diagnosis not present

## 2013-11-08 ENCOUNTER — Other Ambulatory Visit: Payer: Self-pay | Admitting: Cardiology

## 2013-11-11 DIAGNOSIS — E291 Testicular hypofunction: Secondary | ICD-10-CM | POA: Diagnosis not present

## 2013-11-25 DIAGNOSIS — Z7901 Long term (current) use of anticoagulants: Secondary | ICD-10-CM | POA: Diagnosis not present

## 2013-11-25 DIAGNOSIS — E291 Testicular hypofunction: Secondary | ICD-10-CM | POA: Diagnosis not present

## 2013-11-26 ENCOUNTER — Other Ambulatory Visit: Payer: Self-pay | Admitting: Cardiology

## 2013-12-09 DIAGNOSIS — L578 Other skin changes due to chronic exposure to nonionizing radiation: Secondary | ICD-10-CM | POA: Diagnosis not present

## 2013-12-09 DIAGNOSIS — D235 Other benign neoplasm of skin of trunk: Secondary | ICD-10-CM | POA: Diagnosis not present

## 2013-12-09 DIAGNOSIS — E291 Testicular hypofunction: Secondary | ICD-10-CM | POA: Diagnosis not present

## 2013-12-09 DIAGNOSIS — L570 Actinic keratosis: Secondary | ICD-10-CM | POA: Diagnosis not present

## 2013-12-13 ENCOUNTER — Encounter: Payer: Self-pay | Admitting: Cardiology

## 2013-12-13 DIAGNOSIS — F32A Depression, unspecified: Secondary | ICD-10-CM | POA: Insufficient documentation

## 2013-12-13 DIAGNOSIS — I2699 Other pulmonary embolism without acute cor pulmonale: Secondary | ICD-10-CM | POA: Insufficient documentation

## 2013-12-13 DIAGNOSIS — F329 Major depressive disorder, single episode, unspecified: Secondary | ICD-10-CM | POA: Insufficient documentation

## 2013-12-13 DIAGNOSIS — I2510 Atherosclerotic heart disease of native coronary artery without angina pectoris: Secondary | ICD-10-CM | POA: Insufficient documentation

## 2013-12-13 DIAGNOSIS — N200 Calculus of kidney: Secondary | ICD-10-CM | POA: Insufficient documentation

## 2013-12-13 DIAGNOSIS — E7800 Pure hypercholesterolemia, unspecified: Secondary | ICD-10-CM | POA: Insufficient documentation

## 2013-12-13 DIAGNOSIS — C801 Malignant (primary) neoplasm, unspecified: Secondary | ICD-10-CM | POA: Insufficient documentation

## 2013-12-13 DIAGNOSIS — N189 Chronic kidney disease, unspecified: Secondary | ICD-10-CM | POA: Insufficient documentation

## 2013-12-13 DIAGNOSIS — I10 Essential (primary) hypertension: Secondary | ICD-10-CM | POA: Insufficient documentation

## 2013-12-16 ENCOUNTER — Ambulatory Visit: Payer: 59 | Admitting: Cardiology

## 2013-12-19 DIAGNOSIS — E291 Testicular hypofunction: Secondary | ICD-10-CM | POA: Diagnosis not present

## 2013-12-19 DIAGNOSIS — IMO0001 Reserved for inherently not codable concepts without codable children: Secondary | ICD-10-CM | POA: Diagnosis not present

## 2013-12-19 DIAGNOSIS — E559 Vitamin D deficiency, unspecified: Secondary | ICD-10-CM | POA: Diagnosis not present

## 2013-12-19 DIAGNOSIS — E1142 Type 2 diabetes mellitus with diabetic polyneuropathy: Secondary | ICD-10-CM | POA: Diagnosis not present

## 2013-12-19 DIAGNOSIS — N400 Enlarged prostate without lower urinary tract symptoms: Secondary | ICD-10-CM | POA: Diagnosis not present

## 2013-12-19 DIAGNOSIS — E1129 Type 2 diabetes mellitus with other diabetic kidney complication: Secondary | ICD-10-CM | POA: Diagnosis not present

## 2013-12-19 DIAGNOSIS — I10 Essential (primary) hypertension: Secondary | ICD-10-CM | POA: Diagnosis not present

## 2013-12-19 DIAGNOSIS — IMO0002 Reserved for concepts with insufficient information to code with codable children: Secondary | ICD-10-CM | POA: Diagnosis not present

## 2013-12-23 ENCOUNTER — Encounter: Payer: Self-pay | Admitting: Cardiology

## 2013-12-23 ENCOUNTER — Ambulatory Visit (INDEPENDENT_AMBULATORY_CARE_PROVIDER_SITE_OTHER): Payer: 59 | Admitting: Cardiology

## 2013-12-23 VITALS — BP 114/75 | HR 80 | Ht 69.5 in | Wt 224.0 lb

## 2013-12-23 DIAGNOSIS — I739 Peripheral vascular disease, unspecified: Secondary | ICD-10-CM

## 2013-12-23 DIAGNOSIS — I2510 Atherosclerotic heart disease of native coronary artery without angina pectoris: Secondary | ICD-10-CM

## 2013-12-23 DIAGNOSIS — I10 Essential (primary) hypertension: Secondary | ICD-10-CM

## 2013-12-23 DIAGNOSIS — E7800 Pure hypercholesterolemia, unspecified: Secondary | ICD-10-CM | POA: Diagnosis not present

## 2013-12-23 DIAGNOSIS — Z7901 Long term (current) use of anticoagulants: Secondary | ICD-10-CM | POA: Diagnosis not present

## 2013-12-23 DIAGNOSIS — I208 Other forms of angina pectoris: Secondary | ICD-10-CM | POA: Diagnosis not present

## 2013-12-23 DIAGNOSIS — I779 Disorder of arteries and arterioles, unspecified: Secondary | ICD-10-CM | POA: Diagnosis not present

## 2013-12-23 DIAGNOSIS — E291 Testicular hypofunction: Secondary | ICD-10-CM | POA: Diagnosis not present

## 2013-12-23 DIAGNOSIS — I2699 Other pulmonary embolism without acute cor pulmonale: Secondary | ICD-10-CM | POA: Diagnosis not present

## 2013-12-23 MED ORDER — OMEGA-3-ACID ETHYL ESTERS 1 G PO CAPS: 2.0000 g | ORAL_CAPSULE | Freq: Two times a day (BID) | ORAL | Status: DC

## 2013-12-23 NOTE — Patient Instructions (Signed)
Your physician recommends that you continue on your current medications as directed. Please refer to the Current Medication list given to you today.  Your physician wants you to follow-up in: 1 year with Dr. Skains. You will receive a reminder letter in the mail two months in advance. If you don't receive a letter, please call our office to schedule the follow-up appointment.  

## 2013-12-23 NOTE — Progress Notes (Signed)
[facility]. [address], [address], [place]  [address] Phone: [phone] Fax:  [phone]  Date:  12/23/2013   ID:  Evan [name], DOB [birth year], MRN [id number]  PCP:  Henrine Screws, MD   History of Present Illness: Evan [name] is a 68 y.o. male CAD/ASCVD:  Prior bypass surgery in 1999, 2000 cardiac catheterization showed occluded diagonal, RCA graft. Patent LIMA. In 2010 stress test showed no ischemia but angina was noted on treadmill. Currently on Ranexa. Creatinine with chronic kidney disease 1.7. He sees Dr. Inda Merlin for anticoagulation. Had bilateral PE's after last cancer surgery. Life long. Overall he is doing well without any exertional angina. Hyperlipidemia:  His goal LDL is less than 70. Low HDL. Chronic. Hypertension:  Well controlled on current meds.  Carotid artery disease-left internal carotid artery to 79% in November 2014. Denies any strokelike symptoms.    Wt Readings from Last 3 Encounters:  12/23/13 224 lb (101.606 kg)  04/13/12 244 lb 3 oz (110.763 kg)     Past Medical History  Diagnosis Date  . Coronary artery disease   . Cancer   . Diabetes mellitus   . Depression   . Pulmonary emboli   . Hypercholesteremia   . Kidney stone   . Hypertension     Evan [name]  . Chronic renal insufficiency     Past Surgical History  Procedure Laterality Date  . Coronary artery bypass graft      1999  . Melanomia      several skin ca removed  . Cardiac catheterization      x3     last 1999  . Mass excision  04/15/2012    Procedure: EXCISION MASS;  Surgeon: [name], MD;  Location: [facility];  Service: ENT;  Laterality: Left;  Left tonsil biopsy    Current Outpatient Prescriptions  Medication Sig Dispense Refill  . amLODipine (NORVASC) 5 MG tablet TAKE 1 TABLET DAILY  90 tablet  2  . aspirin EC 81 MG tablet Take 81 mg by mouth every evening.      . cholecalciferol (VITAMIN D) 1000 UNITS tablet Take 1,000 Units by mouth every  morning.      . citalopram (CELEXA) 40 MG tablet Take 40 mg by mouth every morning.      [name] co-enzyme Q-10 50 MG capsule Take 100 mg by mouth every morning.      . fenofibrate micronized (LOFIBRA) 67 MG capsule Take 67 mg by mouth every morning.      [name] FLAXSEED, LINSEED, PO Take 2 tablets by mouth 2 (two) times daily.      [name] gabapentin (NEURONTIN) 100 MG capsule Take 100 mg by mouth 2 (two) times daily.      [name] glyBURIDE (DIABETA) 5 MG tablet Take 5 mg by mouth every evening.      . metFORMIN (GLUCOPHAGE) 1000 MG tablet Take 1,000 mg by mouth 2 (two) times daily with a meal.      . metoprolol succinate (TOPROL-XL) 50 MG 24 hr tablet TAKE 1 TABLET DAILY  90 tablet  3  . Multiple Vitamin (MULITIVITAMIN WITH MINERALS) TABS Take 1 tablet by mouth every evening.      [name] omega-3 acid ethyl esters (LOVAZA) 1 G capsule Take 2 g by mouth 2 (two) times daily.      [name] OVER THE COUNTER MEDICATION Take 1 tablet by mouth every morning. Folic Acid with DHA 628 MCG      .  OVER THE COUNTER MEDICATION Take 1 tablet by mouth every morning. Grape seed with Resveratol      . pantoprazole (PROTONIX) 40 MG tablet Take 40 mg by mouth daily.      . phentermine 15 MG capsule Take 15 mg by mouth every morning.      . pioglitazone (ACTOS) 30 MG tablet Take 30 mg by mouth every morning.      . potassium gluconate 595 MG TABS tablet Take 595 mg by mouth daily.      [name] RANEXA 500 MG 12 hr tablet TAKE 1 TABLET TWICE A DAY  180 tablet  2  . rosuvastatin (CRESTOR) 20 MG tablet Take 20 mg by mouth every evening.      . Tamsulosin HCl (FLOMAX) 0.4 MG CAPS Take 0.4 mg by mouth every evening.      . testosterone cypionate (DEPOTESTOTERONE CYPIONATE) 100 MG/ML injection Inject 150 mg into the muscle every 14 (fourteen) days. For IM use only      . topiramate (TOPAMAX) 25 MG tablet Take 25 mg by mouth 2 (two) times daily.      . valsartan (DIOVAN) 160 MG tablet Take 80 mg by mouth every morning.      . vardenafil (LEVITRA) 20 MG tablet  Take 20 mg by mouth daily as needed. For E.D.      . vitamin C (ASCORBIC ACID) 500 MG tablet Take 1,000 mg by mouth every morning.      . warfarin (COUMADIN) 5 MG tablet Take 5-7.5 mg by mouth every evening. M,W,F 7.5mg  Rest of week 5mg        No current facility-administered medications for this visit.    Allergies:    Allergies  Allergen Reactions  . Penicillins Rash    Social History:  The patient  reports that he has never smoked. He does not have any smokeless tobacco history on file. He reports that he does not drink alcohol or use illicit drugs.   ROS:  Please see the history of present illness. Rare palpitations/fluttering sensation, occasional vertigo when changing his high position or standing up quickly.  Denies any fevers, chills, orthopnea, PND  PHYSICAL EXAM: VS:  BP 114/75  Pulse 80  Ht 5' 9.5" (1.765 m)  Wt 224 lb (101.606 kg)  BMI 32.62 kg/m2 Well nourished, well developed, in no acute distress HEENT: normal Neck: no JVD Cardiac:  normal S1, S2; RRR; no murmurprior scar Lungs:  clear to auscultation bilaterally, no wheezing, rhonchi or rales Abd: soft, nontender, no hepatomegalyoverweight Ext: no edema Skin: warm and dry Neuro: no focal abnormalities noted  EKG:  Normal sinus rhythm, 80, left axis deviation, possible septal infarct pattern, poor R wave progression old lateral infarct pattern. Old inferior infarct pattern.  Labs: 1/15-LDL 62 (followed by Dr. Inda Merlin)  ASSESSMENT AND PLAN:  1. Coronary artery disease-status post bypass surgery. Occluded SVG to RCA graft, occluded diagonal. Patent LIMA to LAD. 2. Angina-fairly well controlled. Medications reviewed. 3. Hypertension-good overall control. No changes made. Medications reviewed. 4. Hyperlipidemia-LDL goal less than 70. Excellent. 5. Carotid artery disease-right 20-39% stenosis, left 60-79% mid left ICA stenosis. The study was on 11/14.He has been seeing Dr. Nicola Girt at [facility]. He is contemplating  transitioning to a vascular surgeon here in town because he states that he had to have the surgery he would like to have it done here or locally. He will think about this. Continue with aggressive secondary prevention, statin, aspirin as he is on.  Signed, Elta Guadeloupe  Marlou Porch, MD [facility]  12/23/2013 9:44 AM

## 2013-12-29 ENCOUNTER — Telehealth: Payer: Self-pay | Admitting: *Deleted

## 2013-12-29 NOTE — Telephone Encounter (Signed)
Approved coverage from [employer] for patients OMEGA-3 ACID ETHYL CAPSULE  Through 12/26/2013-12/27/2014.

## 2014-01-06 DIAGNOSIS — E291 Testicular hypofunction: Secondary | ICD-10-CM | POA: Diagnosis not present

## 2014-01-20 DIAGNOSIS — Z7901 Long term (current) use of anticoagulants: Secondary | ICD-10-CM | POA: Diagnosis not present

## 2014-01-20 DIAGNOSIS — E291 Testicular hypofunction: Secondary | ICD-10-CM | POA: Diagnosis not present

## 2014-02-17 DIAGNOSIS — Z7901 Long term (current) use of anticoagulants: Secondary | ICD-10-CM | POA: Diagnosis not present

## 2014-02-24 DIAGNOSIS — E291 Testicular hypofunction: Secondary | ICD-10-CM | POA: Diagnosis not present

## 2014-03-10 DIAGNOSIS — E291 Testicular hypofunction: Secondary | ICD-10-CM | POA: Diagnosis not present

## 2014-03-16 DIAGNOSIS — Z85820 Personal history of malignant melanoma of skin: Secondary | ICD-10-CM | POA: Diagnosis not present

## 2014-03-16 DIAGNOSIS — L738 Other specified follicular disorders: Secondary | ICD-10-CM | POA: Diagnosis not present

## 2014-03-16 DIAGNOSIS — L570 Actinic keratosis: Secondary | ICD-10-CM | POA: Diagnosis not present

## 2014-03-16 DIAGNOSIS — D485 Neoplasm of uncertain behavior of skin: Secondary | ICD-10-CM | POA: Diagnosis not present

## 2014-03-23 DIAGNOSIS — E291 Testicular hypofunction: Secondary | ICD-10-CM | POA: Diagnosis not present

## 2014-03-23 DIAGNOSIS — IMO0001 Reserved for inherently not codable concepts without codable children: Secondary | ICD-10-CM | POA: Diagnosis not present

## 2014-03-23 DIAGNOSIS — M76899 Other specified enthesopathies of unspecified lower limb, excluding foot: Secondary | ICD-10-CM | POA: Diagnosis not present

## 2014-03-23 DIAGNOSIS — Z7901 Long term (current) use of anticoagulants: Secondary | ICD-10-CM | POA: Diagnosis not present

## 2014-03-23 DIAGNOSIS — M719 Bursopathy, unspecified: Secondary | ICD-10-CM | POA: Diagnosis not present

## 2014-03-23 DIAGNOSIS — M67919 Unspecified disorder of synovium and tendon, unspecified shoulder: Secondary | ICD-10-CM | POA: Diagnosis not present

## 2014-03-23 DIAGNOSIS — H8110 Benign paroxysmal vertigo, unspecified ear: Secondary | ICD-10-CM | POA: Diagnosis not present

## 2014-03-23 DIAGNOSIS — I2510 Atherosclerotic heart disease of native coronary artery without angina pectoris: Secondary | ICD-10-CM | POA: Diagnosis not present

## 2014-03-28 DIAGNOSIS — I517 Cardiomegaly: Secondary | ICD-10-CM | POA: Diagnosis not present

## 2014-03-28 DIAGNOSIS — C4370 Malignant melanoma of unspecified lower limb, including hip: Secondary | ICD-10-CM | POA: Diagnosis not present

## 2014-03-28 DIAGNOSIS — Z0389 Encounter for observation for other suspected diseases and conditions ruled out: Secondary | ICD-10-CM | POA: Diagnosis not present

## 2014-03-28 DIAGNOSIS — I25810 Atherosclerosis of coronary artery bypass graft(s) without angina pectoris: Secondary | ICD-10-CM | POA: Diagnosis not present

## 2014-04-21 DIAGNOSIS — Z7901 Long term (current) use of anticoagulants: Secondary | ICD-10-CM | POA: Diagnosis not present

## 2014-04-21 DIAGNOSIS — I80299 Phlebitis and thrombophlebitis of other deep vessels of unspecified lower extremity: Secondary | ICD-10-CM | POA: Diagnosis not present

## 2014-04-21 DIAGNOSIS — E291 Testicular hypofunction: Secondary | ICD-10-CM | POA: Diagnosis not present

## 2014-05-05 DIAGNOSIS — E291 Testicular hypofunction: Secondary | ICD-10-CM | POA: Diagnosis not present

## 2014-05-19 DIAGNOSIS — I2699 Other pulmonary embolism without acute cor pulmonale: Secondary | ICD-10-CM | POA: Diagnosis not present

## 2014-05-19 DIAGNOSIS — Z7901 Long term (current) use of anticoagulants: Secondary | ICD-10-CM | POA: Diagnosis not present

## 2014-05-19 DIAGNOSIS — E291 Testicular hypofunction: Secondary | ICD-10-CM | POA: Diagnosis not present

## 2014-05-19 DIAGNOSIS — T81718A Complication of other artery following a procedure, not elsewhere classified, initial encounter: Secondary | ICD-10-CM | POA: Diagnosis not present

## 2014-06-02 DIAGNOSIS — E291 Testicular hypofunction: Secondary | ICD-10-CM | POA: Diagnosis not present

## 2014-06-06 DIAGNOSIS — I2699 Other pulmonary embolism without acute cor pulmonale: Secondary | ICD-10-CM | POA: Diagnosis not present

## 2014-06-06 DIAGNOSIS — Z7901 Long term (current) use of anticoagulants: Secondary | ICD-10-CM | POA: Diagnosis not present

## 2014-06-06 DIAGNOSIS — T81718A Complication of other artery following a procedure, not elsewhere classified, initial encounter: Secondary | ICD-10-CM | POA: Diagnosis not present

## 2014-06-15 ENCOUNTER — Other Ambulatory Visit: Payer: Self-pay | Admitting: Gastroenterology

## 2014-06-16 DIAGNOSIS — D235 Other benign neoplasm of skin of trunk: Secondary | ICD-10-CM | POA: Diagnosis not present

## 2014-06-16 DIAGNOSIS — L578 Other skin changes due to chronic exposure to nonionizing radiation: Secondary | ICD-10-CM | POA: Diagnosis not present

## 2014-06-16 DIAGNOSIS — E291 Testicular hypofunction: Secondary | ICD-10-CM | POA: Diagnosis not present

## 2014-06-16 DIAGNOSIS — L821 Other seborrheic keratosis: Secondary | ICD-10-CM | POA: Diagnosis not present

## 2014-06-16 DIAGNOSIS — L570 Actinic keratosis: Secondary | ICD-10-CM | POA: Diagnosis not present

## 2014-06-22 ENCOUNTER — Other Ambulatory Visit: Payer: Self-pay | Admitting: Cardiology

## 2014-07-14 DIAGNOSIS — Z7901 Long term (current) use of anticoagulants: Secondary | ICD-10-CM | POA: Diagnosis not present

## 2014-07-14 DIAGNOSIS — E291 Testicular hypofunction: Secondary | ICD-10-CM | POA: Diagnosis not present

## 2014-07-20 DIAGNOSIS — E559 Vitamin D deficiency, unspecified: Secondary | ICD-10-CM | POA: Diagnosis not present

## 2014-07-20 DIAGNOSIS — M7061 Trochanteric bursitis, right hip: Secondary | ICD-10-CM | POA: Diagnosis not present

## 2014-07-20 DIAGNOSIS — E1142 Type 2 diabetes mellitus with diabetic polyneuropathy: Secondary | ICD-10-CM | POA: Diagnosis not present

## 2014-07-20 DIAGNOSIS — Z7901 Long term (current) use of anticoagulants: Secondary | ICD-10-CM | POA: Diagnosis not present

## 2014-07-20 DIAGNOSIS — Z79899 Other long term (current) drug therapy: Secondary | ICD-10-CM | POA: Diagnosis not present

## 2014-07-20 DIAGNOSIS — M109 Gout, unspecified: Secondary | ICD-10-CM | POA: Diagnosis not present

## 2014-07-20 DIAGNOSIS — Z0001 Encounter for general adult medical examination with abnormal findings: Secondary | ICD-10-CM | POA: Diagnosis not present

## 2014-07-20 DIAGNOSIS — E780 Pure hypercholesterolemia: Secondary | ICD-10-CM | POA: Diagnosis not present

## 2014-07-20 DIAGNOSIS — I10 Essential (primary) hypertension: Secondary | ICD-10-CM | POA: Diagnosis not present

## 2014-07-20 DIAGNOSIS — E291 Testicular hypofunction: Secondary | ICD-10-CM | POA: Diagnosis not present

## 2014-07-21 ENCOUNTER — Other Ambulatory Visit: Payer: Self-pay | Admitting: Cardiology

## 2014-07-29 ENCOUNTER — Other Ambulatory Visit: Payer: Self-pay | Admitting: Cardiology

## 2014-08-26 ENCOUNTER — Other Ambulatory Visit: Payer: Self-pay | Admitting: Cardiology

## 2014-09-07 ENCOUNTER — Encounter (HOSPITAL_COMMUNITY): Payer: Self-pay | Admitting: *Deleted

## 2014-09-11 ENCOUNTER — Other Ambulatory Visit: Payer: Self-pay | Admitting: Gastroenterology

## 2014-09-25 ENCOUNTER — Other Ambulatory Visit: Payer: Self-pay | Admitting: Cardiology

## 2014-09-26 ENCOUNTER — Ambulatory Visit (HOSPITAL_COMMUNITY): Payer: 59 | Admitting: Anesthesiology

## 2014-09-26 ENCOUNTER — Encounter (HOSPITAL_COMMUNITY): Payer: Self-pay

## 2014-09-26 ENCOUNTER — Ambulatory Visit (HOSPITAL_COMMUNITY)
Admission: RE | Admit: 2014-09-26 | Discharge: 2014-09-26 | Disposition: A | Discharge status: Home / Self Care | Payer: 59 | Source: Ambulatory Visit | Attending: Gastroenterology | Admitting: Gastroenterology

## 2014-09-26 ENCOUNTER — Encounter (HOSPITAL_COMMUNITY)
Admission: RE | Disposition: A | Discharge status: Home / Self Care | Payer: Self-pay | Source: Ambulatory Visit | Attending: Gastroenterology

## 2014-09-26 DIAGNOSIS — Z1211 Encounter for screening for malignant neoplasm of colon: Secondary | ICD-10-CM | POA: Insufficient documentation

## 2014-09-26 DIAGNOSIS — E119 Type 2 diabetes mellitus without complications: Secondary | ICD-10-CM | POA: Diagnosis not present

## 2014-09-26 DIAGNOSIS — I2510 Atherosclerotic heart disease of native coronary artery without angina pectoris: Secondary | ICD-10-CM | POA: Diagnosis not present

## 2014-09-26 DIAGNOSIS — E780 Pure hypercholesterolemia: Secondary | ICD-10-CM | POA: Insufficient documentation

## 2014-09-26 DIAGNOSIS — Z888 Allergy status to other drugs, medicaments and biological substances status: Secondary | ICD-10-CM | POA: Insufficient documentation

## 2014-09-26 DIAGNOSIS — Z880 Allergy status to penicillin: Secondary | ICD-10-CM | POA: Insufficient documentation

## 2014-09-26 DIAGNOSIS — Z85820 Personal history of malignant melanoma of skin: Secondary | ICD-10-CM | POA: Diagnosis not present

## 2014-09-26 DIAGNOSIS — Z7901 Long term (current) use of anticoagulants: Secondary | ICD-10-CM | POA: Diagnosis not present

## 2014-09-26 DIAGNOSIS — Z86711 Personal history of pulmonary embolism: Secondary | ICD-10-CM | POA: Diagnosis not present

## 2014-09-26 DIAGNOSIS — K219 Gastro-esophageal reflux disease without esophagitis: Secondary | ICD-10-CM | POA: Diagnosis not present

## 2014-09-26 DIAGNOSIS — M109 Gout, unspecified: Secondary | ICD-10-CM | POA: Insufficient documentation

## 2014-09-26 DIAGNOSIS — Z86010 Personal history of colonic polyps: Secondary | ICD-10-CM | POA: Diagnosis not present

## 2014-09-26 DIAGNOSIS — F329 Major depressive disorder, single episode, unspecified: Secondary | ICD-10-CM | POA: Diagnosis not present

## 2014-09-26 DIAGNOSIS — Z951 Presence of aortocoronary bypass graft: Secondary | ICD-10-CM | POA: Diagnosis not present

## 2014-09-26 DIAGNOSIS — I10 Essential (primary) hypertension: Secondary | ICD-10-CM | POA: Insufficient documentation

## 2014-09-26 HISTORY — PX: COLONOSCOPY WITH PROPOFOL: SHX5780

## 2014-09-26 HISTORY — DX: Unspecified cataract: H26.9

## 2014-09-26 HISTORY — DX: Sleep apnea, unspecified: G47.30

## 2014-09-26 LAB — GLUCOSE, CAPILLARY: Glucose-Capillary: 176 mg/dL — ABNORMAL HIGH (ref 70–99)

## 2014-09-26 SURGERY — COLONOSCOPY WITH PROPOFOL
Anesthesia: Monitor Anesthesia Care

## 2014-09-26 MED ORDER — PROPOFOL 10 MG/ML IV BOLUS
INTRAVENOUS | Status: AC
  Filled 2014-09-26: qty 20

## 2014-09-26 MED ORDER — LACTATED RINGERS IV SOLN
INTRAVENOUS | Status: DC
  Administered 2014-09-26: 09:00:00 via INTRAVENOUS

## 2014-09-26 MED ORDER — KETAMINE HCL 10 MG/ML IJ SOLN
INTRAMUSCULAR | Status: DC | PRN
  Administered 2014-09-26: 20 mg via INTRAVENOUS

## 2014-09-26 MED ORDER — LIDOCAINE HCL (CARDIAC) 20 MG/ML IV SOLN
INTRAVENOUS | Status: AC
  Filled 2014-09-26: qty 5

## 2014-09-26 MED ORDER — LIDOCAINE HCL (CARDIAC) 20 MG/ML IV SOLN
INTRAVENOUS | Status: DC | PRN
  Administered 2014-09-26: 100 mg via INTRAVENOUS

## 2014-09-26 MED ORDER — ONDANSETRON HCL 4 MG/2ML IJ SOLN
INTRAMUSCULAR | Status: AC
  Filled 2014-09-26: qty 2

## 2014-09-26 MED ORDER — PROPOFOL INFUSION 10 MG/ML OPTIME
INTRAVENOUS | Status: DC | PRN
  Administered 2014-09-26: 300 ug/kg/min via INTRAVENOUS

## 2014-09-26 MED ORDER — SODIUM CHLORIDE 0.9 % IV SOLN: INTRAVENOUS | Status: DC

## 2014-09-26 MED ORDER — FENTANYL CITRATE 0.05 MG/ML IJ SOLN: 25.0000 ug | INTRAMUSCULAR | Status: DC | PRN

## 2014-09-26 MED ORDER — SODIUM CHLORIDE 0.9 % IV SOLN
INTRAVENOUS | Status: DC
  Administered 2014-09-26: 1000 mL via INTRAVENOUS

## 2014-09-26 MED ORDER — ONDANSETRON HCL 4 MG/2ML IJ SOLN
INTRAMUSCULAR | Status: DC | PRN
  Administered 2014-09-26: 4 mg via INTRAVENOUS

## 2014-09-26 SURGICAL SUPPLY — 22 items

## 2014-09-26 NOTE — Anesthesia Postprocedure Evaluation (Signed)
  Anesthesia Post-op Note  Patient: Evan [name]  Procedure(s) Performed: Procedure(s) (LRB): COLONOSCOPY WITH PROPOFOL (N/A)  Patient Location: PACU  Anesthesia Type: MAC  Level of Consciousness: awake and alert   Airway and Oxygen Therapy: Patient Spontanous Breathing  Post-op Pain: mild  Post-op Assessment: Post-op Vital signs reviewed, Patient's Cardiovascular Status Stable, Respiratory Function Stable, Patent Airway and No signs of Nausea or vomiting  Last Vitals:  Filed Vitals:   09/26/14 1038  BP:   Pulse: 69  Temp:   Resp: 13    Post-op Vital Signs: stable   Complications: No apparent anesthesia complications

## 2014-09-26 NOTE — H&P (Signed)
  Procedure: Surveillance colonoscopy. History of adenomatous colon polyps removed colonoscopically. Normal surveillance colonoscopy performed on 04/05/2009. Chronic Coumadin anticoagulation following bilateral pulmonary emboli in 2004 without recurrence. Metastatic melanoma.  History: The patient is a 68 year old male born 03-Apr-1946. He is scheduled to undergo a surveillance colonoscopy with polypectomy to prevent colon cancer.  Past medical history: Melanoma surgery. Coronary artery bypass grafting surgery. Coronary artery disease. Metastatic melanoma. Type 2 diabetes mellitus. Depression. Hypercholesterolemia. Hypertension. Gastroesophageal reflux. Bilateral pulmonary emboli in 2000 or. Chronic Coumadin anticoagulation. Gout. Hypogonadism. History of colonic adenomatous polyps. Carotid artery disease.  Medication allergies: Penicillin. Rifampin. Doxycycline. Neurontin.  Exam: Patient is alert and lying comfortably on the endoscopy stretcher. Abdomen is soft and nontender to palpation. Lungs are clear to auscultation. Cardiac exam reveals a regular rhythm.  Plan: Proceed with surveillance colonoscopy

## 2014-09-26 NOTE — Anesthesia Preprocedure Evaluation (Addendum)
Anesthesia Evaluation  Patient identified by MRN, date of birth, ID band Patient awake    Reviewed: Allergy & Precautions, H&P , NPO status , Patient's Chart, lab work & pertinent test results, reviewed documented beta blocker date and time   Airway Mallampati: II  TM Distance: >3 FB Neck ROM: full    Dental  (+) Chipped, Dental Advisory Given Small chip left upper front:   Pulmonary neg pulmonary ROS, sleep apnea , PE breath sounds clear to auscultation  Pulmonary exam normal       Cardiovascular Exercise Tolerance: Good hypertension, Pt. on medications and Pt. on home beta blockers + CAD, + Cardiac Stents and + CABG negative cardio ROS  Rhythm:regular Rate:Normal     Neuro/Psych Depression Carotid artery disease negative neurological ROS  negative psych ROS   GI/Hepatic negative GI ROS, Neg liver ROS, GERD-  Medicated and Controlled,  Endo/Other  negative endocrine ROSdiabetes, Well Controlled, Type 2, Oral Hypoglycemic Agents  Renal/GU CRFRenal diseasenegative Renal ROS  negative genitourinary   Musculoskeletal   Abdominal   Peds  Hematology negative hematology ROS (+)   Anesthesia Other Findings   Reproductive/Obstetrics negative OB ROS                            Anesthesia Physical Anesthesia Plan  ASA: III  Anesthesia Plan: MAC   Post-op Pain Management:    Induction:   Airway Management Planned:   Additional Equipment:   Intra-op Plan:   Post-operative Plan:   Informed Consent: I have reviewed the patients History and Physical, chart, labs and discussed the procedure including the risks, benefits and alternatives for the proposed anesthesia with the patient or authorized representative who has indicated his/her understanding and acceptance.   Dental Advisory Given  Plan Discussed with: CRNA and Surgeon  Anesthesia Plan Comments:         Anesthesia Quick  Evaluation

## 2014-09-26 NOTE — Transfer of Care (Signed)
Immediate Anesthesia Transfer of Care Note  Patient: ILYA [name]  Procedure(s) Performed: Procedure(s) (LRB): COLONOSCOPY WITH PROPOFOL (N/A)  Patient Location: PACU  Anesthesia Type: MAC  Level of Consciousness: sedated, patient cooperative and responds to stimulation  Airway & Oxygen Therapy: Patient Spontanous Breathing and Patient connected to face mask oxgen  Post-op Assessment: Report given to PACU RN and Post -op Vital signs reviewed and stable  Post vital signs: Reviewed and stable  Complications: No apparent anesthesia complications

## 2014-09-26 NOTE — Op Note (Signed)
Procedure: Surveillance colonoscopy. Adenomatous colon polyps removed colonoscopically in the past. Normal surveillance colonoscopy performed on 04/05/2009  Endoscopist: Earle Gell  Premedication: Propofol administered by anesthesia  Procedure: The patient was placed in the left lateral decubitus position. Anal inspection and digital rectal exam were normal. The Pentax pediatric colonoscope was introduced into the rectum and it to the cecum. A normal-appearing ileocecal valve was identified. A normal-appearing appendiceal orifice was identified. Colonic preparation for the exam today was good. Withdrawal time was 9 minutes  Rectum. Normal. Retroflexed view of the distal rectum normal  Sigmoid colon and descending colon. Normal  Splenic flexure. Normal  Transverse colon. Normal  Hepatic flexure. Normal  Ascending colon. Normal  Cecum and ileocecal valve. Normal  Assessment: Normal surveillance colonoscopy  Recommendation: Schedule surveillance colonoscopy in 5 years if the patient's health allows.

## 2014-09-27 ENCOUNTER — Encounter (HOSPITAL_COMMUNITY): Payer: Self-pay | Admitting: Gastroenterology

## 2014-10-02 DIAGNOSIS — E291 Testicular hypofunction: Secondary | ICD-10-CM | POA: Diagnosis not present

## 2014-10-16 DIAGNOSIS — E291 Testicular hypofunction: Secondary | ICD-10-CM | POA: Diagnosis not present

## 2014-10-16 DIAGNOSIS — Z86711 Personal history of pulmonary embolism: Secondary | ICD-10-CM | POA: Diagnosis not present

## 2014-10-16 DIAGNOSIS — Z7901 Long term (current) use of anticoagulants: Secondary | ICD-10-CM | POA: Diagnosis not present

## 2014-10-30 DIAGNOSIS — E291 Testicular hypofunction: Secondary | ICD-10-CM | POA: Diagnosis not present

## 2014-11-12 ENCOUNTER — Other Ambulatory Visit: Payer: Self-pay | Admitting: Cardiology

## 2014-11-13 DIAGNOSIS — Z7901 Long term (current) use of anticoagulants: Secondary | ICD-10-CM | POA: Diagnosis not present

## 2014-11-13 DIAGNOSIS — E291 Testicular hypofunction: Secondary | ICD-10-CM | POA: Diagnosis not present

## 2014-11-13 NOTE — Telephone Encounter (Signed)
PATIENT HAS APPT SCHEDULED AT THE END OF MARCH. WILL NEED TO KEEP THIS FOR MORE REFILLS.

## 2014-11-27 DIAGNOSIS — E291 Testicular hypofunction: Secondary | ICD-10-CM | POA: Diagnosis not present

## 2014-12-11 DIAGNOSIS — E291 Testicular hypofunction: Secondary | ICD-10-CM | POA: Diagnosis not present

## 2014-12-11 DIAGNOSIS — H2513 Age-related nuclear cataract, bilateral: Secondary | ICD-10-CM | POA: Diagnosis not present

## 2014-12-11 DIAGNOSIS — Z7901 Long term (current) use of anticoagulants: Secondary | ICD-10-CM | POA: Diagnosis not present

## 2014-12-11 DIAGNOSIS — E119 Type 2 diabetes mellitus without complications: Secondary | ICD-10-CM | POA: Diagnosis not present

## 2014-12-15 DIAGNOSIS — Z08 Encounter for follow-up examination after completed treatment for malignant neoplasm: Secondary | ICD-10-CM | POA: Diagnosis not present

## 2014-12-15 DIAGNOSIS — D225 Melanocytic nevi of trunk: Secondary | ICD-10-CM | POA: Diagnosis not present

## 2014-12-15 DIAGNOSIS — L820 Inflamed seborrheic keratosis: Secondary | ICD-10-CM | POA: Diagnosis not present

## 2014-12-15 DIAGNOSIS — L570 Actinic keratosis: Secondary | ICD-10-CM | POA: Diagnosis not present

## 2014-12-15 DIAGNOSIS — Z85820 Personal history of malignant melanoma of skin: Secondary | ICD-10-CM | POA: Diagnosis not present

## 2014-12-20 ENCOUNTER — Other Ambulatory Visit: Payer: Self-pay | Admitting: Cardiology

## 2014-12-25 DIAGNOSIS — E291 Testicular hypofunction: Secondary | ICD-10-CM | POA: Diagnosis not present

## 2014-12-26 ENCOUNTER — Ambulatory Visit: Payer: 59 | Admitting: Interventional Cardiology

## 2015-01-09 DIAGNOSIS — E291 Testicular hypofunction: Secondary | ICD-10-CM | POA: Diagnosis not present

## 2015-01-09 DIAGNOSIS — Z7901 Long term (current) use of anticoagulants: Secondary | ICD-10-CM | POA: Diagnosis not present

## 2015-01-23 DIAGNOSIS — I2699 Other pulmonary embolism without acute cor pulmonale: Secondary | ICD-10-CM | POA: Diagnosis not present

## 2015-01-23 DIAGNOSIS — M109 Gout, unspecified: Secondary | ICD-10-CM | POA: Diagnosis not present

## 2015-01-23 DIAGNOSIS — M722 Plantar fascial fibromatosis: Secondary | ICD-10-CM | POA: Diagnosis not present

## 2015-01-23 DIAGNOSIS — I10 Essential (primary) hypertension: Secondary | ICD-10-CM | POA: Diagnosis not present

## 2015-01-23 DIAGNOSIS — E1121 Type 2 diabetes mellitus with diabetic nephropathy: Secondary | ICD-10-CM | POA: Diagnosis not present

## 2015-01-23 DIAGNOSIS — Z6834 Body mass index (BMI) 34.0-34.9, adult: Secondary | ICD-10-CM | POA: Diagnosis not present

## 2015-01-23 DIAGNOSIS — E559 Vitamin D deficiency, unspecified: Secondary | ICD-10-CM | POA: Diagnosis not present

## 2015-01-23 DIAGNOSIS — N400 Enlarged prostate without lower urinary tract symptoms: Secondary | ICD-10-CM | POA: Diagnosis not present

## 2015-01-23 DIAGNOSIS — E291 Testicular hypofunction: Secondary | ICD-10-CM | POA: Diagnosis not present

## 2015-01-23 DIAGNOSIS — E669 Obesity, unspecified: Secondary | ICD-10-CM | POA: Diagnosis not present

## 2015-01-23 DIAGNOSIS — I25721 Atherosclerosis of autologous artery coronary artery bypass graft(s) with angina pectoris with documented spasm: Secondary | ICD-10-CM | POA: Diagnosis not present

## 2015-01-23 DIAGNOSIS — Z7901 Long term (current) use of anticoagulants: Secondary | ICD-10-CM | POA: Diagnosis not present

## 2015-01-24 ENCOUNTER — Other Ambulatory Visit: Payer: Self-pay | Admitting: Cardiology

## 2015-01-31 ENCOUNTER — Ambulatory Visit: Payer: 59 | Admitting: Cardiology

## 2015-02-06 DIAGNOSIS — M65872 Other synovitis and tenosynovitis, left ankle and foot: Secondary | ICD-10-CM | POA: Diagnosis not present

## 2015-02-06 DIAGNOSIS — M792 Neuralgia and neuritis, unspecified: Secondary | ICD-10-CM | POA: Diagnosis not present

## 2015-02-06 DIAGNOSIS — Z7901 Long term (current) use of anticoagulants: Secondary | ICD-10-CM | POA: Diagnosis not present

## 2015-02-06 DIAGNOSIS — M79609 Pain in unspecified limb: Secondary | ICD-10-CM | POA: Diagnosis not present

## 2015-02-06 DIAGNOSIS — M65871 Other synovitis and tenosynovitis, right ankle and foot: Secondary | ICD-10-CM | POA: Diagnosis not present

## 2015-02-06 DIAGNOSIS — M722 Plantar fascial fibromatosis: Secondary | ICD-10-CM | POA: Diagnosis not present

## 2015-02-06 DIAGNOSIS — E291 Testicular hypofunction: Secondary | ICD-10-CM | POA: Diagnosis not present

## 2015-02-06 DIAGNOSIS — M2170 Unequal limb length (acquired), unspecified site: Secondary | ICD-10-CM | POA: Diagnosis not present

## 2015-02-13 DIAGNOSIS — M2012 Hallux valgus (acquired), left foot: Secondary | ICD-10-CM | POA: Diagnosis not present

## 2015-02-13 DIAGNOSIS — M2011 Hallux valgus (acquired), right foot: Secondary | ICD-10-CM | POA: Diagnosis not present

## 2015-02-13 DIAGNOSIS — M216X9 Other acquired deformities of unspecified foot: Secondary | ICD-10-CM | POA: Diagnosis not present

## 2015-02-13 DIAGNOSIS — M722 Plantar fascial fibromatosis: Secondary | ICD-10-CM | POA: Diagnosis not present

## 2015-02-16 ENCOUNTER — Ambulatory Visit (INDEPENDENT_AMBULATORY_CARE_PROVIDER_SITE_OTHER): Payer: 59 | Admitting: Cardiology

## 2015-02-16 ENCOUNTER — Encounter: Payer: Self-pay | Admitting: Cardiology

## 2015-02-16 ENCOUNTER — Other Ambulatory Visit: Payer: Self-pay | Admitting: *Deleted

## 2015-02-16 VITALS — BP 132/88 | HR 87 | Ht 69.0 in | Wt 229.0 lb

## 2015-02-16 DIAGNOSIS — I2510 Atherosclerotic heart disease of native coronary artery without angina pectoris: Secondary | ICD-10-CM | POA: Diagnosis not present

## 2015-02-16 DIAGNOSIS — I779 Disorder of arteries and arterioles, unspecified: Secondary | ICD-10-CM | POA: Diagnosis not present

## 2015-02-16 DIAGNOSIS — E7800 Pure hypercholesterolemia, unspecified: Secondary | ICD-10-CM

## 2015-02-16 DIAGNOSIS — I10 Essential (primary) hypertension: Secondary | ICD-10-CM | POA: Diagnosis not present

## 2015-02-16 DIAGNOSIS — I6523 Occlusion and stenosis of bilateral carotid arteries: Secondary | ICD-10-CM

## 2015-02-16 DIAGNOSIS — I739 Peripheral vascular disease, unspecified: Secondary | ICD-10-CM

## 2015-02-16 MED ORDER — OMEGA-3-ACID ETHYL ESTERS 1 G PO CAPS: 2.0000 | ORAL_CAPSULE | Freq: Two times a day (BID) | ORAL | Status: DC

## 2015-02-16 NOTE — Progress Notes (Signed)
[place], [address], [place]  [address] Phone: [phone] Fax:  [phone]  Date:  02/16/2015   ID:  Evan [name], DOB 07-25-1946, MRN [id number]  PCP:  Henrine Screws, MD   History of Present Illness: Evan [name] is a 69 y.o. male  CAD/ASCVD:  Prior bypass surgery in 1999, 2000 cardiac catheterization showed occluded diagonal, RCA graft. Patent LIMA. In 2010 stress test showed no ischemia but angina was noted on treadmill. Currently on Ranexa. Creatinine with chronic kidney disease 1.7. He sees Dr. Inda Merlin for anticoagulation. Had bilateral PE's after last cancer surgery. Life long anticoagulation. Overall he is doing well without any exertional angina. He has had an occasional palpitation which is new for him. Feels his heart flipping.  Hyperlipidemia:  His goal LDL is less than 70. Low HDL. Chronic.  Hypertension:  Well controlled on current meds.  Carotid artery disease-left internal carotid artery to 79% in November 2014. Denies any strokelike symptoms.    Wt Readings from Last 3 Encounters:  02/16/15 229 lb (103.874 kg)  09/26/14 218 lb 5 oz (99.026 kg)  12/23/13 224 lb (101.606 kg)     Past Medical History  Diagnosis Date  . Diabetes mellitus   . Depression   . Pulmonary emboli     tx. Coumadin-no problems now  . Hypercholesteremia   . Kidney stone   . Hypertension     Evan [name]  . Chronic renal insufficiency   [facility].Radiation([facility]) also.  . Cataracts, bilateral   . Sleep apnea     no cpap use at this time(not in over a year).  . Coronary artery disease     '99 -stents, then CABPG-stents failed. Dr. Marlou Porch, cardiologist.    Past Surgical History  Procedure Laterality Date  . Coronary artery bypass graft      1999  . Melanomia      several skin ca removed-nose, left ankle, parotid gland left, right nodes-being followed annually..  . Cardiac  catheterization      x3     last 1999  . Mass excision  04/15/2012    Procedure: EXCISION MASS;  Surgeon: [name], MD;  Location: [facility] OR;  Service: ENT;  Laterality: Left;  Left tonsil biopsy  . Cholecystectomy      lap. Cholecystectomy about 5-8 yrs ago  . Colonoscopy with propofol N/A 09/26/2014    Procedure: COLONOSCOPY WITH PROPOFOL;  Surgeon: [name], MD;  Location: [facility] ENDOSCOPY;  Service: Endoscopy;  Laterality: N/A;    Current Outpatient Prescriptions  Medication Sig Dispense Refill  . amLODipine (NORVASC) 5 MG tablet Take 5 mg by mouth every morning.    [name] aspirin EC 81 MG tablet Take 81 mg by mouth every evening.    . B Complex-C (SUPER B COMPLEX PO) Take 1 tablet by mouth daily.    . cholecalciferol (VITAMIN D) 1000 UNITS tablet Take 2,000 Units by mouth every morning.     . citalopram (CELEXA) 40 MG tablet Take 40 mg by mouth every morning.    [name] co-enzyme Q-10 50 MG capsule Take 100 mg by mouth every morning.    . Cranberry 500 MG CAPS Take 500 mg by mouth daily.    . fenofibrate micronized (LOFIBRA) 67 MG capsule TAKE 1 CAPSULE DAILY 90 capsule 3  . FLAXSEED, LINSEED, PO Take 2 tablets by mouth 2 (two)  times daily.    . folic acid (FOLVITE) 664 MCG tablet Take 400 mcg by mouth daily.    [name] gabapentin (NEURONTIN) 100 MG capsule Take 100 mg by mouth 2 (two) times daily.    [name] glyBURIDE (DIABETA) 5 MG tablet Take 5 mg by mouth every evening.    . metFORMIN (GLUCOPHAGE) 1000 MG tablet Take 1,000 mg by mouth 2 (two) times daily with a meal.    . metoprolol succinate (TOPROL-XL) 50 MG 24 hr tablet Take 50 mg by mouth every morning. Take with or immediately following a meal.    . Multiple Vitamin (MULITIVITAMIN WITH MINERALS) TABS Take 1 tablet by mouth every evening.    [name] omega-3 acid ethyl esters (LOVAZA) 1 G capsule TAKE 2 CAPSULES TWICE A DAY 120 capsule 1  . pantoprazole (PROTONIX) 40 MG tablet Take 40 mg by mouth daily.    . phentermine 15 MG capsule Take 15 mg by mouth  every morning.  2  . pioglitazone (ACTOS) 30 MG tablet Take 30 mg by mouth every morning.    . potassium gluconate 595 MG TABS tablet Take 595 mg by mouth daily.    . ranolazine (RANEXA) 500 MG 12 hr tablet Take 500 mg by mouth 2 (two) times daily.    . rosuvastatin (CRESTOR) 20 MG tablet Take 20 mg by mouth every evening.    . Tamsulosin HCl (FLOMAX) 0.4 MG CAPS Take 0.4 mg by mouth every evening.    . testosterone cypionate (DEPOTESTOTERONE CYPIONATE) 100 MG/ML injection Inject 150 mg into the muscle every 14 (fourteen) days. For IM use only    . topiramate (TOPAMAX) 25 MG tablet Take 25 mg by mouth daily.     . valsartan (DIOVAN) 160 MG tablet Take 80 mg by mouth every morning.    . vardenafil (LEVITRA) 20 MG tablet Take 20 mg by mouth daily as needed. For E.D.    . vitamin C (ASCORBIC ACID) 500 MG tablet Take 1,000 mg by mouth every morning.    . warfarin (COUMADIN) 5 MG tablet Take 5 mg by mouth every evening.      No current facility-administered medications for this visit.    Allergies:    Allergies  Allergen Reactions  . Penicillins Rash    Social History:  The patient  reports that he has never smoked. He does not have any smokeless tobacco history on file. He reports that he does not drink alcohol or use illicit drugs.   ROS:  Please see the history of present illness. Rare palpitations/fluttering sensation, occasional vertigo when changing his high position or standing up quickly.  Denies any fevers, chills, orthopnea, PND  PHYSICAL EXAM: VS:  BP 132/88 mmHg  Pulse 87  Ht 5\' 9"  (1.753 m)  Wt 229 lb (103.874 kg)  BMI 33.80 kg/m2 Well nourished, well developed, in no acute distress HEENT: Evidence of lymph node resection and neck, nose Neck: no JVD Cardiac:  normal S1, S2; RRR; no murmurprior scar Lungs:  clear to auscultation bilaterally, no wheezing, rhonchi or rales Abd: soft, nontender, no hepatomegalyoverweight Ext: no edema Skin: warm and dry Neuro: no focal  abnormalities noted  EKG:  02/16/15-normal sinus rhythm, 87, vertical axis, right bundle branch block like morphology, old septal infarct pattern, old inferior infarct pattern. Prior Normal sinus rhythm, 80, left axis deviation, possible septal infarct pattern, poor R wave progression old lateral infarct pattern. Old inferior infarct pattern.  Labs: 1/15-LDL 62 (followed by Dr. Inda Merlin)  ASSESSMENT AND  PLAN:  1. Coronary artery disease-status post bypass surgery. Occluded SVG to RCA graft, occluded diagonal. Patent LIMA to LAD. With his recent palpitations, we will check an echocardiogram. It has been several years since last check. 2. Angina-fairly well controlled. Medications reviewed. 3. Hypertension-good overall control. No changes made. Medications reviewed. 4. Hyperlipidemia-LDL goal less than 70. Excellent. He would like refill on Lovaza. 5. Carotid artery disease-right 20-39% stenosis, left 60-79% mid left ICA stenosis. The study was on 11/14.He has been seeing Dr. Nicola Girt at [facility]. We will go ahead and refer him to vascular surgeon here in town at his request for monitoring his carotid artery disease. Continue with aggressive secondary prevention, statin, aspirin. 6. Chronic anticoagulation-Dr. Gates-Coumadin-PE 7. History of melanoma-has had lymph nodes in neck  Resection. This may complicate carotid endarterectomy if this is needed.  Signed, Candee Furbish, MD [facility]  02/16/2015 9:19 AM

## 2015-02-16 NOTE — Patient Instructions (Signed)
Medication Instructions:  Your physician recommends that you continue on your current medications as directed. Please refer to the Current Medication list given to you today.  Testing/Procedures: Your physician has requested that you have an echocardiogram. Echocardiography is a painless test that uses sound waves to create images of your heart. It provides your doctor with information about the size and shape of your heart and how well your heart's chambers and valves are working. This procedure takes approximately one hour. There are no restrictions for this procedure.  Follow-Up: Follow up in 1 year with Dr. Marlou Porch.  You will receive a letter in the mail 2 months before you are due.  Please call us when you receive this letter to schedule your follow up appointment.  You have been referred to a Vascular Surgeon to follow your carotid artery disease.  Thank you for choosing [facility]!!

## 2015-02-20 DIAGNOSIS — E291 Testicular hypofunction: Secondary | ICD-10-CM | POA: Diagnosis not present

## 2015-02-27 ENCOUNTER — Other Ambulatory Visit: Payer: Self-pay

## 2015-02-27 ENCOUNTER — Ambulatory Visit (HOSPITAL_COMMUNITY): Payer: 59 | Attending: Cardiovascular Disease

## 2015-02-27 DIAGNOSIS — I10 Essential (primary) hypertension: Secondary | ICD-10-CM

## 2015-02-27 DIAGNOSIS — I2510 Atherosclerotic heart disease of native coronary artery without angina pectoris: Secondary | ICD-10-CM | POA: Diagnosis not present

## 2015-02-27 DIAGNOSIS — I517 Cardiomegaly: Secondary | ICD-10-CM | POA: Diagnosis not present

## 2015-03-13 DIAGNOSIS — E291 Testicular hypofunction: Secondary | ICD-10-CM | POA: Diagnosis not present

## 2015-03-13 DIAGNOSIS — Z7901 Long term (current) use of anticoagulants: Secondary | ICD-10-CM | POA: Diagnosis not present

## 2015-03-20 ENCOUNTER — Other Ambulatory Visit: Payer: Self-pay | Admitting: Cardiology

## 2015-03-27 DIAGNOSIS — Z888 Allergy status to other drugs, medicaments and biological substances status: Secondary | ICD-10-CM | POA: Diagnosis not present

## 2015-03-27 DIAGNOSIS — C4339 Malignant melanoma of other parts of face: Secondary | ICD-10-CM | POA: Diagnosis not present

## 2015-03-27 DIAGNOSIS — E782 Mixed hyperlipidemia: Secondary | ICD-10-CM | POA: Diagnosis not present

## 2015-03-27 DIAGNOSIS — Z7901 Long term (current) use of anticoagulants: Secondary | ICD-10-CM | POA: Diagnosis not present

## 2015-03-27 DIAGNOSIS — Z79899 Other long term (current) drug therapy: Secondary | ICD-10-CM | POA: Diagnosis not present

## 2015-03-27 DIAGNOSIS — C4331 Malignant melanoma of nose: Secondary | ICD-10-CM | POA: Diagnosis not present

## 2015-03-27 DIAGNOSIS — C4372 Malignant melanoma of left lower limb, including hip: Secondary | ICD-10-CM | POA: Diagnosis not present

## 2015-03-27 DIAGNOSIS — Z880 Allergy status to penicillin: Secondary | ICD-10-CM | POA: Diagnosis not present

## 2015-03-27 DIAGNOSIS — Z7982 Long term (current) use of aspirin: Secondary | ICD-10-CM | POA: Diagnosis not present

## 2015-03-27 DIAGNOSIS — I6529 Occlusion and stenosis of unspecified carotid artery: Secondary | ICD-10-CM | POA: Diagnosis not present

## 2015-03-27 DIAGNOSIS — E119 Type 2 diabetes mellitus without complications: Secondary | ICD-10-CM | POA: Diagnosis not present

## 2015-03-27 DIAGNOSIS — Z86711 Personal history of pulmonary embolism: Secondary | ICD-10-CM | POA: Diagnosis not present

## 2015-03-28 ENCOUNTER — Encounter: Payer: Self-pay | Admitting: Vascular Surgery

## 2015-03-30 ENCOUNTER — Ambulatory Visit (HOSPITAL_COMMUNITY)
Admission: RE | Admit: 2015-03-30 | Discharge: 2015-03-30 | Disposition: A | Discharge status: Home / Self Care | Payer: 59 | Source: Ambulatory Visit | Attending: Vascular Surgery | Admitting: Vascular Surgery

## 2015-03-30 ENCOUNTER — Encounter: Payer: Self-pay | Admitting: Vascular Surgery

## 2015-03-30 ENCOUNTER — Ambulatory Visit (INDEPENDENT_AMBULATORY_CARE_PROVIDER_SITE_OTHER): Payer: 59 | Admitting: Vascular Surgery

## 2015-03-30 VITALS — BP 99/62 | HR 72 | Temp 97.0°F | Resp 16 | Ht 70.0 in | Wt 227.0 lb

## 2015-03-30 DIAGNOSIS — I6523 Occlusion and stenosis of bilateral carotid arteries: Secondary | ICD-10-CM

## 2015-03-30 NOTE — Progress Notes (Signed)
[facility]  Patient name: Evan [name] MRN: [id number] DOB: 1946/01/20 Sex: male  REASON FOR CONSULT: Follow up of carotid disease. Referred by Dr. Candee Furbish  HPI: Evan [name] is a 69 y.o. male who has been followed in [facility] with bilateral carotid disease. However he is moved to this area and therefore he was sent for continued follow up here in [place].  I have reviewed the records that were sent with him. I do see a carotid duplex scan from 08/13/2010 which showed a 40-59% right carotid stenosis and a 60-79% left carotid stenosis. He apparently had a carotid duplex scan on 08/13/2014 but he was unable to find this study. However, reportedly there was no significant change at that time.  He is right-handed. He denies any history of stroke, TIAs, expressive or receptive aphasia, or amaurosis fugax.  He does have a history of melanoma which has been excised from his left leg, nose, parotid gland, and lymph nodes.  In 2005 he had a pulmonary embolus after surgery for melanoma. He has been on Coumadin for this. He is also undergone previous coronary revascularization by Dr. Cyndia Bent.   Past Medical History  Diagnosis Date  . Diabetes mellitus   . Depression   . Pulmonary emboli     tx. Coumadin-no problems now  . Hypercholesteremia   . Kidney stone   . Hypertension     mark [facility]ains  . Chronic renal insufficiency   . [facility] any tx. for melanoma.Radiation([facility]) also.  . Cataracts, bilateral   . Sleep apnea     no cpap use at this time(not in over a year).  . Coronary artery disease     '99 -stents, then CABPG-stents failed. Dr. Marlou Porch, cardiologist.   No family history on file. There is no family history of premature cardiovascular disease.  SOCIAL HISTORY: History  Substance Use Topics  . Smoking status: Never Smoker   . Smokeless tobacco: Not on file  . Alcohol Use: No    Allergies  Allergen Reactions  . Doxycycline Other (See Comments)    BPH can't void when taking   . Penicillins Rash   Current Outpatient Prescriptions  Medication Sig Dispense Refill  . amLODipine (NORVASC) 5 MG tablet Take 5 mg by mouth every morning.    [name] aspirin EC 81 MG tablet Take 81 mg by mouth every evening.    . B Complex-C (SUPER B COMPLEX PO) Take 1 tablet by mouth daily.    . cholecalciferol (VITAMIN D) 1000 UNITS tablet Take 2,000 Units by mouth every morning.     . citalopram (CELEXA) 40 MG tablet Take 40 mg by mouth every morning.    [name] co-enzyme Q-10 50 MG capsule Take 100 mg by mouth every morning.    . Cranberry 500 MG CAPS Take 500 mg by mouth daily.    . fenofibrate micronized (LOFIBRA) 67 MG capsule TAKE 1 CAPSULE DAILY 90 capsule 3  . FLAXSEED, LINSEED, PO Take 2 tablets by mouth 2 (two) times daily.    . folic acid (FOLVITE) 588 MCG tablet Take 400 mcg by mouth daily.    [name] gabapentin (NEURONTIN) 100 MG capsule Take 100 mg by mouth 2 (two) times daily.    [name] glyBURIDE (DIABETA) 5 MG tablet Take 5 mg by mouth every evening.    . metFORMIN (GLUCOPHAGE) 1000 MG tablet Take 1,000 mg by mouth 2 (two) times  daily with a meal.    . metoprolol succinate (TOPROL-XL) 50 MG 24 hr tablet Take 50 mg by mouth every morning. Take with or immediately following a meal.    . Multiple Vitamin (MULITIVITAMIN WITH MINERALS) TABS Take 1 tablet by mouth every evening.    [name] omega-3 acid ethyl esters (LOVAZA) 1 G capsule Take 2 capsules (2 g total) by mouth 2 (two) times daily. 360 capsule 3  . pantoprazole (PROTONIX) 40 MG tablet Take 40 mg by mouth daily.    . phentermine 15 MG capsule Take 15 mg by mouth every morning.  2  . pioglitazone (ACTOS) 30 MG tablet Take 30 mg by mouth every morning.    . potassium gluconate 595 MG TABS tablet Take 595 mg by mouth daily.    . ranolazine (RANEXA) 500 MG 12 hr tablet Take 500 mg by mouth 2 (two) times daily.    . rosuvastatin (CRESTOR) 20 MG  tablet Take 20 mg by mouth every evening.    . Tamsulosin HCl (FLOMAX) 0.4 MG CAPS Take 0.4 mg by mouth every evening.    . testosterone cypionate (DEPOTESTOTERONE CYPIONATE) 100 MG/ML injection Inject 150 mg into the muscle every 14 (fourteen) days. For IM use only    . topiramate (TOPAMAX) 25 MG tablet Take 25 mg by mouth daily.     . valsartan (DIOVAN) 160 MG tablet Take 80 mg by mouth every morning.    . vardenafil (LEVITRA) 20 MG tablet Take 20 mg by mouth daily as needed. For E.D.    . vitamin C (ASCORBIC ACID) 500 MG tablet Take 1,000 mg by mouth every morning.    . warfarin (COUMADIN) 5 MG tablet Take 5 mg by mouth every evening.      No current facility-administered medications for this visit.   REVIEW OF SYSTEMS: Valu.Nieves ] denotes positive finding; [  ] denotes negative finding  CARDIOVASCULAR:  Valu.Nieves ] chest pain   [ ]  chest pressure   Valu.Nieves ] palpitations   [ ]  orthopnea   Valu.Nieves ] dyspnea on exertion   [ ]  claudication   [ ]  rest pain   Valu.Nieves ] DVT   [ ]  phlebitis PULMONARY:   [ ]  productive cough   [ ]  asthma   [ ]  wheezing NEUROLOGIC:   [ ]  weakness  [ ]  paresthesias  [ ]  aphasia  [ ]  amaurosis  Valu.Nieves ] dizziness HEMATOLOGIC:   [ ]  bleeding problems   [ ]  clotting disorders MUSCULOSKELETAL:  [ ]  joint pain   [ ]  joint swelling [ ]  leg swelling GASTROINTESTINAL: [ ]   blood in stool  [ ]   hematemesis GENITOURINARY:  [ ]   dysuria  [ ]   hematuria PSYCHIATRIC:  [ ]  history of major depression INTEGUMENTARY:  [ ]  rashes  [ ]  ulcers CONSTITUTIONAL:  [ ]  fever   [ ]  chills  PHYSICAL EXAM: Filed Vitals:   03/30/15 1019 03/30/15 1021  BP: 93/60 99/62  Pulse: 74 72  Temp: 97 F (36.1 C)   TempSrc: Oral   Resp: 16   Height: 5\' 10"  (1.778 m)   Weight: 227 lb (102.967 kg)   SpO2: 98%    GENERAL: The patient is a well-nourished male, in no acute distress. The vital signs are documented above. CARDIOVASCULAR: There is a regular rate and rhythm. I do not detect carotid bruits. He has palpable femoral,  popliteal, and pedal pulses bilaterally. He has no significant lower extremity swelling. PULMONARY: There is  good air exchange bilaterally without wheezing or rales. ABDOMEN: Soft and non-tender with normal pitched bowel sounds. It is difficult to assess for aneurysm because of his size. MUSCULOSKELETAL: There are no major deformities or cyanosis. NEUROLOGIC: No focal weakness or paresthesias are detected. SKIN: there is a wound in his left distal leg where he had a previous melanoma excised.PSYCHIATRIC: The patient has a normal affect.  DATA:  CAROTID DUPLEX: I have independently interpreted his carotid duplex scan today which shows a less than 40% carotid stenosis on the right. There is a 40-59% left carotid stenosis.  MEDICAL ISSUES:  BILATERAL CAROTID STENOSES: this patient has a moderate left carotid stenosis which is asymptomatic with no significant right carotid stenosis. I have recommended a follow carotid duplex scan in 1 year and I'll see him back at that time. He is asymptomatic and he understands we would not consider carotid endarterectomy unless the stenosis progressed to greater than 80%. He is on aspirin and is on a statin.   Deitra Mayo [facility] [place]: [phone]

## 2015-04-03 DIAGNOSIS — E291 Testicular hypofunction: Secondary | ICD-10-CM | POA: Diagnosis not present

## 2015-04-17 DIAGNOSIS — I2699 Other pulmonary embolism without acute cor pulmonale: Secondary | ICD-10-CM | POA: Diagnosis not present

## 2015-04-17 DIAGNOSIS — E291 Testicular hypofunction: Secondary | ICD-10-CM | POA: Diagnosis not present

## 2015-04-23 ENCOUNTER — Other Ambulatory Visit: Payer: Self-pay

## 2015-04-23 ENCOUNTER — Other Ambulatory Visit: Payer: Self-pay | Admitting: Cardiology

## 2015-04-23 MED ORDER — RANOLAZINE ER 500 MG PO TB12: 500.0000 mg | ORAL_TABLET | Freq: Two times a day (BID) | ORAL | Status: DC

## 2015-04-29 ENCOUNTER — Other Ambulatory Visit: Payer: Self-pay | Admitting: Adult Health

## 2015-05-03 DIAGNOSIS — E1151 Type 2 diabetes mellitus with diabetic peripheral angiopathy without gangrene: Secondary | ICD-10-CM | POA: Diagnosis not present

## 2015-05-03 DIAGNOSIS — L84 Corns and callosities: Secondary | ICD-10-CM | POA: Diagnosis not present

## 2015-05-03 DIAGNOSIS — I739 Peripheral vascular disease, unspecified: Secondary | ICD-10-CM | POA: Diagnosis not present

## 2015-05-15 DIAGNOSIS — E291 Testicular hypofunction: Secondary | ICD-10-CM | POA: Diagnosis not present

## 2015-05-16 DIAGNOSIS — E1142 Type 2 diabetes mellitus with diabetic polyneuropathy: Secondary | ICD-10-CM | POA: Diagnosis not present

## 2015-05-16 DIAGNOSIS — Z7901 Long term (current) use of anticoagulants: Secondary | ICD-10-CM | POA: Diagnosis not present

## 2015-05-16 DIAGNOSIS — E291 Testicular hypofunction: Secondary | ICD-10-CM | POA: Diagnosis not present

## 2015-05-16 DIAGNOSIS — L570 Actinic keratosis: Secondary | ICD-10-CM | POA: Diagnosis not present

## 2015-05-16 DIAGNOSIS — I25721 Atherosclerosis of autologous artery coronary artery bypass graft(s) with angina pectoris with documented spasm: Secondary | ICD-10-CM | POA: Diagnosis not present

## 2015-05-16 DIAGNOSIS — Z85820 Personal history of malignant melanoma of skin: Secondary | ICD-10-CM | POA: Diagnosis not present

## 2015-05-16 DIAGNOSIS — Z79899 Other long term (current) drug therapy: Secondary | ICD-10-CM | POA: Diagnosis not present

## 2015-05-16 DIAGNOSIS — I2699 Other pulmonary embolism without acute cor pulmonale: Secondary | ICD-10-CM | POA: Diagnosis not present

## 2015-05-16 DIAGNOSIS — I739 Peripheral vascular disease, unspecified: Secondary | ICD-10-CM | POA: Diagnosis not present

## 2015-05-16 DIAGNOSIS — M109 Gout, unspecified: Secondary | ICD-10-CM | POA: Diagnosis not present

## 2015-05-16 DIAGNOSIS — K219 Gastro-esophageal reflux disease without esophagitis: Secondary | ICD-10-CM | POA: Diagnosis not present

## 2015-05-16 DIAGNOSIS — I6523 Occlusion and stenosis of bilateral carotid arteries: Secondary | ICD-10-CM | POA: Diagnosis not present

## 2015-05-29 DIAGNOSIS — E291 Testicular hypofunction: Secondary | ICD-10-CM | POA: Diagnosis not present

## 2015-06-12 DIAGNOSIS — Z7901 Long term (current) use of anticoagulants: Secondary | ICD-10-CM | POA: Diagnosis not present

## 2015-06-12 DIAGNOSIS — E291 Testicular hypofunction: Secondary | ICD-10-CM | POA: Diagnosis not present

## 2015-06-26 DIAGNOSIS — E291 Testicular hypofunction: Secondary | ICD-10-CM | POA: Diagnosis not present

## 2015-06-26 DIAGNOSIS — D1801 Hemangioma of skin and subcutaneous tissue: Secondary | ICD-10-CM | POA: Diagnosis not present

## 2015-06-26 DIAGNOSIS — D225 Melanocytic nevi of trunk: Secondary | ICD-10-CM | POA: Diagnosis not present

## 2015-06-26 DIAGNOSIS — Z85820 Personal history of malignant melanoma of skin: Secondary | ICD-10-CM | POA: Diagnosis not present

## 2015-06-26 DIAGNOSIS — L570 Actinic keratosis: Secondary | ICD-10-CM | POA: Diagnosis not present

## 2015-06-26 DIAGNOSIS — Z08 Encounter for follow-up examination after completed treatment for malignant neoplasm: Secondary | ICD-10-CM | POA: Diagnosis not present

## 2015-07-10 DIAGNOSIS — E291 Testicular hypofunction: Secondary | ICD-10-CM | POA: Diagnosis not present

## 2015-07-10 DIAGNOSIS — Z7901 Long term (current) use of anticoagulants: Secondary | ICD-10-CM | POA: Diagnosis not present

## 2015-07-19 DIAGNOSIS — L84 Corns and callosities: Secondary | ICD-10-CM | POA: Diagnosis not present

## 2015-07-19 DIAGNOSIS — E1151 Type 2 diabetes mellitus with diabetic peripheral angiopathy without gangrene: Secondary | ICD-10-CM | POA: Diagnosis not present

## 2015-07-19 DIAGNOSIS — L603 Nail dystrophy: Secondary | ICD-10-CM | POA: Diagnosis not present

## 2015-07-19 DIAGNOSIS — I739 Peripheral vascular disease, unspecified: Secondary | ICD-10-CM | POA: Diagnosis not present

## 2015-07-24 DIAGNOSIS — Z7901 Long term (current) use of anticoagulants: Secondary | ICD-10-CM | POA: Diagnosis not present

## 2015-07-24 DIAGNOSIS — I2690 Septic pulmonary embolism without acute cor pulmonale: Secondary | ICD-10-CM | POA: Diagnosis not present

## 2015-07-24 DIAGNOSIS — T800XXA Air embolism following infusion, transfusion and therapeutic injection, initial encounter: Secondary | ICD-10-CM | POA: Diagnosis not present

## 2015-07-24 DIAGNOSIS — E291 Testicular hypofunction: Secondary | ICD-10-CM | POA: Diagnosis not present

## 2015-08-20 DIAGNOSIS — Z79899 Other long term (current) drug therapy: Secondary | ICD-10-CM | POA: Diagnosis not present

## 2015-08-20 DIAGNOSIS — I10 Essential (primary) hypertension: Secondary | ICD-10-CM | POA: Diagnosis not present

## 2015-08-20 DIAGNOSIS — I25119 Atherosclerotic heart disease of native coronary artery with unspecified angina pectoris: Secondary | ICD-10-CM | POA: Diagnosis not present

## 2015-08-20 DIAGNOSIS — Z0000 Encounter for general adult medical examination without abnormal findings: Secondary | ICD-10-CM | POA: Diagnosis not present

## 2015-08-20 DIAGNOSIS — Z7901 Long term (current) use of anticoagulants: Secondary | ICD-10-CM | POA: Diagnosis not present

## 2015-08-20 DIAGNOSIS — Z125 Encounter for screening for malignant neoplasm of prostate: Secondary | ICD-10-CM | POA: Diagnosis not present

## 2015-08-20 DIAGNOSIS — E559 Vitamin D deficiency, unspecified: Secondary | ICD-10-CM | POA: Diagnosis not present

## 2015-08-20 DIAGNOSIS — I25721 Atherosclerosis of autologous artery coronary artery bypass graft(s) with angina pectoris with documented spasm: Secondary | ICD-10-CM | POA: Diagnosis not present

## 2015-08-20 DIAGNOSIS — Z1389 Encounter for screening for other disorder: Secondary | ICD-10-CM | POA: Diagnosis not present

## 2015-08-20 DIAGNOSIS — E291 Testicular hypofunction: Secondary | ICD-10-CM | POA: Diagnosis not present

## 2015-08-20 DIAGNOSIS — M109 Gout, unspecified: Secondary | ICD-10-CM | POA: Diagnosis not present

## 2015-08-20 DIAGNOSIS — E1121 Type 2 diabetes mellitus with diabetic nephropathy: Secondary | ICD-10-CM | POA: Diagnosis not present

## 2015-10-01 ENCOUNTER — Other Ambulatory Visit: Payer: Self-pay | Admitting: Cardiology

## 2015-10-02 DIAGNOSIS — Z7901 Long term (current) use of anticoagulants: Secondary | ICD-10-CM | POA: Diagnosis not present

## 2015-10-02 DIAGNOSIS — E291 Testicular hypofunction: Secondary | ICD-10-CM | POA: Diagnosis not present

## 2015-10-09 DIAGNOSIS — I739 Peripheral vascular disease, unspecified: Secondary | ICD-10-CM | POA: Diagnosis not present

## 2015-10-09 DIAGNOSIS — S9031XA Contusion of right foot, initial encounter: Secondary | ICD-10-CM | POA: Diagnosis not present

## 2015-10-09 DIAGNOSIS — L84 Corns and callosities: Secondary | ICD-10-CM | POA: Diagnosis not present

## 2015-10-09 DIAGNOSIS — E1151 Type 2 diabetes mellitus with diabetic peripheral angiopathy without gangrene: Secondary | ICD-10-CM | POA: Diagnosis not present

## 2015-10-09 DIAGNOSIS — L603 Nail dystrophy: Secondary | ICD-10-CM | POA: Diagnosis not present

## 2015-10-09 DIAGNOSIS — M792 Neuralgia and neuritis, unspecified: Secondary | ICD-10-CM | POA: Diagnosis not present

## 2015-10-16 DIAGNOSIS — E291 Testicular hypofunction: Secondary | ICD-10-CM | POA: Diagnosis not present

## 2015-10-30 DIAGNOSIS — E291 Testicular hypofunction: Secondary | ICD-10-CM | POA: Diagnosis not present

## 2015-10-30 DIAGNOSIS — Z7901 Long term (current) use of anticoagulants: Secondary | ICD-10-CM | POA: Diagnosis not present

## 2015-11-13 DIAGNOSIS — E291 Testicular hypofunction: Secondary | ICD-10-CM | POA: Diagnosis not present

## 2015-11-27 DIAGNOSIS — I2690 Septic pulmonary embolism without acute cor pulmonale: Secondary | ICD-10-CM | POA: Diagnosis not present

## 2015-11-27 DIAGNOSIS — Z7901 Long term (current) use of anticoagulants: Secondary | ICD-10-CM | POA: Diagnosis not present

## 2015-11-27 DIAGNOSIS — T800XXA Air embolism following infusion, transfusion and therapeutic injection, initial encounter: Secondary | ICD-10-CM | POA: Diagnosis not present

## 2015-11-27 DIAGNOSIS — E291 Testicular hypofunction: Secondary | ICD-10-CM | POA: Diagnosis not present

## 2015-12-04 DIAGNOSIS — M79671 Pain in right foot: Secondary | ICD-10-CM | POA: Diagnosis not present

## 2015-12-04 DIAGNOSIS — M792 Neuralgia and neuritis, unspecified: Secondary | ICD-10-CM | POA: Diagnosis not present

## 2015-12-04 DIAGNOSIS — M722 Plantar fascial fibromatosis: Secondary | ICD-10-CM | POA: Diagnosis not present

## 2015-12-11 DIAGNOSIS — E291 Testicular hypofunction: Secondary | ICD-10-CM | POA: Diagnosis not present

## 2015-12-25 DIAGNOSIS — D1801 Hemangioma of skin and subcutaneous tissue: Secondary | ICD-10-CM | POA: Diagnosis not present

## 2015-12-25 DIAGNOSIS — D225 Melanocytic nevi of trunk: Secondary | ICD-10-CM | POA: Diagnosis not present

## 2015-12-25 DIAGNOSIS — L812 Freckles: Secondary | ICD-10-CM | POA: Diagnosis not present

## 2015-12-25 DIAGNOSIS — E291 Testicular hypofunction: Secondary | ICD-10-CM | POA: Diagnosis not present

## 2015-12-25 DIAGNOSIS — Z85820 Personal history of malignant melanoma of skin: Secondary | ICD-10-CM | POA: Diagnosis not present

## 2015-12-25 DIAGNOSIS — L570 Actinic keratosis: Secondary | ICD-10-CM | POA: Diagnosis not present

## 2015-12-25 DIAGNOSIS — L821 Other seborrheic keratosis: Secondary | ICD-10-CM | POA: Diagnosis not present

## 2015-12-25 DIAGNOSIS — Z85828 Personal history of other malignant neoplasm of skin: Secondary | ICD-10-CM | POA: Diagnosis not present

## 2015-12-25 DIAGNOSIS — L738 Other specified follicular disorders: Secondary | ICD-10-CM | POA: Diagnosis not present

## 2015-12-25 DIAGNOSIS — Z7901 Long term (current) use of anticoagulants: Secondary | ICD-10-CM | POA: Diagnosis not present

## 2015-12-25 DIAGNOSIS — L820 Inflamed seborrheic keratosis: Secondary | ICD-10-CM | POA: Diagnosis not present

## 2016-01-08 ENCOUNTER — Other Ambulatory Visit: Payer: Self-pay | Admitting: Cardiology

## 2016-01-22 DIAGNOSIS — T800XXA Air embolism following infusion, transfusion and therapeutic injection, initial encounter: Secondary | ICD-10-CM | POA: Diagnosis not present

## 2016-01-22 DIAGNOSIS — I2690 Septic pulmonary embolism without acute cor pulmonale: Secondary | ICD-10-CM | POA: Diagnosis not present

## 2016-01-22 DIAGNOSIS — Z7901 Long term (current) use of anticoagulants: Secondary | ICD-10-CM | POA: Diagnosis not present

## 2016-01-22 DIAGNOSIS — E291 Testicular hypofunction: Secondary | ICD-10-CM | POA: Diagnosis not present

## 2016-02-04 DIAGNOSIS — E119 Type 2 diabetes mellitus without complications: Secondary | ICD-10-CM | POA: Diagnosis not present

## 2016-02-04 DIAGNOSIS — H524 Presbyopia: Secondary | ICD-10-CM | POA: Diagnosis not present

## 2016-02-04 DIAGNOSIS — H2513 Age-related nuclear cataract, bilateral: Secondary | ICD-10-CM | POA: Diagnosis not present

## 2016-02-05 DIAGNOSIS — E291 Testicular hypofunction: Secondary | ICD-10-CM | POA: Diagnosis not present

## 2016-02-12 ENCOUNTER — Encounter: Payer: Self-pay | Admitting: Cardiology

## 2016-02-17 NOTE — Progress Notes (Addendum)
[facility], [address] [place], [place]  [address] Phone: [phone] Fax:  [phone]  Date:  02/18/2016   ID:  KEIGAN SALDARRIAGA, DOB 1946-06-14, MRN [id number]  PCP:  Henrine Screws, MD   History of Present Illness: Evan [name] is a 70 y.o. male  CAD/ASCVD:  Prior bypass surgery in 1999, 2000 cardiac catheterization showed occluded diagonal, RCA graft. Patent LIMA. In 2010 stress test showed no ischemia but angina was noted on treadmill. Currently on Ranexa (trying to stop now). Creatinine with chronic kidney disease 1.7. He sees Dr. Inda Merlin for anticoagulation. Had bilateral PE's after last cancer surgery 2004. Life long anticoagulation. Overall he is doing well without any exertional angina. He has had an occasional palpitation which is new for him. Feels his heart flipping he notes again, not prolonged. Fleeting.. Plantar fasciitis.   Hyperlipidemia:  His goal LDL is less than 70. Low HDL. Chronic.  Hypertension:  Well controlled on current meds.  Carotid artery disease-left internal carotid artery to 79% in November 2014. Denies any strokelike symptoms. Dr. Scot Dock now seeing  Note he works at the [employer], he is friends with Karma Ganja patient of mine. 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  Wt Readings from Last 3 Encounters:  02/18/16 234 lb 3.2 oz (106.232 kg)  03/30/15 227 lb (102.967 kg)  02/16/15 229 lb (103.874 kg)     Past Medical History  Diagnosis Date  . Diabetes mellitus   . Depression   . Pulmonary emboli (HCC)     tx. Coumadin-no problems now  . Hypercholesteremia   . Kidney stone   . Hypertension     [facility] [place]  . Chronic renal insufficiency   . [facility] any [facility].Radiation([facility]) also.  . Cataracts, bilateral   . Sleep apnea     no cpap use at this time(not in over a year).  . Coronary artery disease     '99 -stents, then CABPG-stents failed. Dr. Marlou Porch, cardiologist.    Past Surgical History  Procedure Laterality Date  . Coronary artery bypass graft      1999  . Melanomia      several skin ca removed-nose, left ankle, parotid gland left, right nodes-being followed annually..  . Cardiac catheterization      x3     last 1999  . Mass excision  04/15/2012    Procedure: EXCISION MASS;  Surgeon: [name], MD;  Location: [facility] OR;  Service: ENT;  Laterality: Left;  Left tonsil biopsy  . Cholecystectomy      lap. Cholecystectomy about 5-8 yrs ago  . Colonoscopy with propofol N/A 09/26/2014    Procedure: COLONOSCOPY WITH PROPOFOL;  Surgeon: [name], MD;  Location: [facility] END[facility]COPY;  Service: Endoscopy;  Laterality: N/A;    Current Outpatient Prescriptions  Medication Sig Dispense Refill  . amLODipine (NORVASC) 5 MG tablet Take 5 mg by mouth every morning.    [name] aspirin EC 81 MG tablet Take 81 mg by mouth every evening.    . B Complex-C (SUPER B COMPLEX PO) Take 1 tablet by mouth daily.    . cholecalciferol (VITAMIN D) 1000 UNITS tablet Take 2,000 Units by mouth every morning.     . citalopram (CELEXA) 40 MG tablet Take 40 mg by mouth every morning.    [name] co-enzyme Q-10 50  MG capsule Take 100 mg by mouth every morning.    . Cranberry 500 MG CAPS Take 500 mg by mouth daily.    . fenofibrate micronized (LOFIBRA) 67 MG capsule TAKE 1 CAPSULE DAILY 90 capsule 3  . FLAXSEED, LINSEED, PO Take 2 tablets by mouth 2 (two) times daily.    . folic acid (FOLVITE) A999333 MCG tablet Take 400 mcg by mouth daily.    [name] gabapentin (NEURONTIN) 100 MG capsule Take 100 mg by mouth 2 (two) times daily.    [name] glyBURIDE (DIABETA) 5 MG tablet Take 5 mg by mouth every evening.    . metFORMIN (GLUCOPHAGE) 1000 MG tablet Take 1,000 mg by mouth 2 (two) times daily with a meal.    . metoprolol succinate (TOPROL-XL) 50 MG 24 hr tablet Take 50 mg by mouth every morning. Take with or immediately following a meal.    .  metoprolol succinate (TOPROL-XL) 50 MG 24 hr tablet TAKE 1 TABLET DAILY 90 tablet 2  . Multiple Vitamin (MULITIVITAMIN WITH MINERALS) TABS Take 1 tablet by mouth every evening.    [name] omega-3 acid ethyl esters (LOVAZA) 1 g capsule Take 2 capsules (2 g total) by mouth 2 (two) times daily. 360 capsule 0  . pantoprazole (PROTONIX) 40 MG tablet Take 40 mg by mouth daily.    . pioglitazone (ACTOS) 30 MG tablet Take 30 mg by mouth every morning.    . potassium gluconate 595 MG TABS tablet Take 595 mg by mouth daily.    . ranolazine (RANEXA) 500 MG 12 hr tablet Take 1 tablet (500 mg total) by mouth 2 (two) times daily. 180 tablet 3  . rosuvastatin (CRESTOR) 20 MG tablet Take 20 mg by mouth every evening.    . Tamsulosin HCl (FLOMAX) 0.4 MG CAPS Take 0.4 mg by mouth every evening.    . testosterone cypionate (DEPOTESTOTERONE CYPIONATE) 100 MG/ML injection Inject 150 mg into the muscle every 14 (fourteen) days. For IM use only    . valsartan (DIOVAN) 160 MG tablet Take 80 mg by mouth every morning.    . vardenafil (LEVITRA) 20 MG tablet Take 20 mg by mouth daily as needed. For E.D.    . vitamin C (ASCORBIC ACID) 500 MG tablet Take 1,000 mg by mouth every morning.    . warfarin (COUMADIN) 5 MG tablet Take 5 mg by mouth every evening.      No current facility-administered medications for this visit.    Allergies:    Allergies  Allergen Reactions  . Doxycycline Other (See Comments)    BPH can't void when taking   . Penicillins Rash    Social History:  The patient  reports that he has never smoked. He does not have any smokeless tobacco history on file. He reports that he does not drink alcohol or use illicit drugs.   ROS:  Please see the history of present illness. Rare palpitations/fluttering sensation, occasional vertigo when changing his high position or standing up quickly.  Denies any fevers, chills, orthopnea, PND  PHYSICAL EXAM: VS:  BP 104/70 mmHg  Pulse 80  Ht 5\' 10"  (1.778 m)  Wt 234 lb  3.2 oz (106.232 kg)  BMI 33.60 kg/m2 Well nourished, well developed, in no acute distress HEENT: Evidence of lymph node resection and neck, nose Neck: no JVD Cardiac:  normal S1, S2; RRR; no murmurprior scar Lungs:  clear to auscultation bilaterally, no wheezing, rhonchi or rales Abd: soft, nontender, no hepatomegalyoverweight Ext: no edema Skin: warm  and dry Neuro: no focal abnormalities noted  EKG: EKG ordered today. 02/18/16-sinus rhythm, 80, left axis deviation, nonspecific intraventricular conduction of leg, nonspecific ST-T wave changes. Personally viewed-prior 02/16/15-normal sinus rhythm, 87, vertical axis, right bundle branch block like morphology, old septal infarct pattern, old inferior infarct pattern. Prior Normal sinus rhythm, 80, left axis deviation, possible septal infarct pattern, poor R wave progression old lateral infarct pattern. Old inferior infarct pattern.  ECHO 02/27/15 - Left ventricle: The cavity size was mildly dilated. Wall  thickness was normal. Systolic function was normal. The estimated  ejection fraction was in the range of 55% to 60%. Wall motion was  normal; there were no regional wall motion abnormalities.  Labs: 1/15-LDL 62 (followed by Dr. Inda Merlin)  ASSESSMENT AND PLAN:  1. Coronary artery disease-status post bypass surgery. Occluded SVG to RCA graft, occluded diagonal. Patent LIMA to LAD. EF is normal 5560%. 2. Angina-fairly well controlled. Medications reviewed. I'm going to try to taper off his Ranexa to see if this is not needed. Take for 1 week once a day and then stop. He will call us if any changes occur. 3. Hypertension-good overall control. No changes made. Medications reviewed. 4. Hyperlipidemia-LDL goal less than 70. Excellent.  5. Carotid artery disease-right 20-39% stenosis, left 60-79% mid left ICA stenosis. The study was on 11/14. He had been seeing Dr. Nicola Girt at [facility]. Now Dr. Scot Dock with [facility] monitoring his carotid artery disease.  Continue with aggressive secondary prevention, statin, aspirin. 6. Chronic anticoagulation-Dr. Gates-Coumadin-PE, lifelong since 2004. 7. History of melanoma-has had lymph nodes in neck  Resection. This may complicate carotid endarterectomy if this is needed. 8. One-year follow-up  Signed, Candee Furbish, MD [facility]  02/18/2016 9:57 AM

## 2016-02-18 ENCOUNTER — Ambulatory Visit (INDEPENDENT_AMBULATORY_CARE_PROVIDER_SITE_OTHER): Payer: 59 | Admitting: Cardiology

## 2016-02-18 ENCOUNTER — Encounter: Payer: Self-pay | Admitting: Cardiology

## 2016-02-18 VITALS — BP 104/70 | HR 80 | Ht 70.0 in | Wt 234.2 lb

## 2016-02-18 DIAGNOSIS — I10 Essential (primary) hypertension: Secondary | ICD-10-CM

## 2016-02-18 DIAGNOSIS — I208 Other forms of angina pectoris: Secondary | ICD-10-CM

## 2016-02-18 DIAGNOSIS — I2510 Atherosclerotic heart disease of native coronary artery without angina pectoris: Secondary | ICD-10-CM | POA: Diagnosis not present

## 2016-02-18 DIAGNOSIS — I6523 Occlusion and stenosis of bilateral carotid arteries: Secondary | ICD-10-CM

## 2016-02-18 DIAGNOSIS — E7800 Pure hypercholesterolemia, unspecified: Secondary | ICD-10-CM

## 2016-02-18 NOTE — Patient Instructions (Addendum)
Medication Instructions:  Please decrease Ranexa to once daily for 1 week, after that you may stop. Continue all other medications as listed.  Follow-Up: Follow up in 1 year with Dr. Marlou Porch.  You will receive a letter in the mail 2 months before you are due.  Please call us when you receive this letter to schedule your follow up appointment.  If you need a refill on your cardiac medications before your next appointment, please call your pharmacy.  Thank you for choosing [facility]!!

## 2016-02-19 DIAGNOSIS — Z7901 Long term (current) use of anticoagulants: Secondary | ICD-10-CM | POA: Diagnosis not present

## 2016-02-19 DIAGNOSIS — E291 Testicular hypofunction: Secondary | ICD-10-CM | POA: Diagnosis not present

## 2016-03-04 DIAGNOSIS — E291 Testicular hypofunction: Secondary | ICD-10-CM | POA: Diagnosis not present

## 2016-03-18 ENCOUNTER — Other Ambulatory Visit: Payer: Self-pay | Admitting: Cardiology

## 2016-03-18 DIAGNOSIS — E291 Testicular hypofunction: Secondary | ICD-10-CM | POA: Diagnosis not present

## 2016-03-18 DIAGNOSIS — Z7901 Long term (current) use of anticoagulants: Secondary | ICD-10-CM | POA: Diagnosis not present

## 2016-04-02 ENCOUNTER — Encounter (HOSPITAL_COMMUNITY): Payer: Medicare Other

## 2016-04-02 ENCOUNTER — Ambulatory Visit: Payer: Medicare Other | Admitting: Vascular Surgery

## 2016-04-04 ENCOUNTER — Encounter: Payer: Self-pay | Admitting: Vascular Surgery

## 2016-04-08 DIAGNOSIS — E291 Testicular hypofunction: Secondary | ICD-10-CM | POA: Diagnosis not present

## 2016-04-08 DIAGNOSIS — C4372 Malignant melanoma of left lower limb, including hip: Secondary | ICD-10-CM | POA: Diagnosis not present

## 2016-04-08 DIAGNOSIS — C4331 Malignant melanoma of nose: Secondary | ICD-10-CM | POA: Diagnosis not present

## 2016-04-09 ENCOUNTER — Ambulatory Visit (HOSPITAL_COMMUNITY)
Admission: RE | Admit: 2016-04-09 | Discharge: 2016-04-09 | Disposition: A | Discharge status: Home / Self Care | Payer: 59 | Source: Ambulatory Visit | Attending: Vascular Surgery | Admitting: Vascular Surgery

## 2016-04-09 ENCOUNTER — Other Ambulatory Visit: Payer: Self-pay | Admitting: Cardiology

## 2016-04-09 ENCOUNTER — Ambulatory Visit (INDEPENDENT_AMBULATORY_CARE_PROVIDER_SITE_OTHER): Payer: 59 | Admitting: Vascular Surgery

## 2016-04-09 ENCOUNTER — Encounter: Payer: Self-pay | Admitting: Vascular Surgery

## 2016-04-09 VITALS — BP 110/60 | HR 75 | Temp 97.0°F | Resp 16 | Ht 70.0 in | Wt 236.0 lb

## 2016-04-09 DIAGNOSIS — I2510 Atherosclerotic heart disease of native coronary artery without angina pectoris: Secondary | ICD-10-CM | POA: Diagnosis not present

## 2016-04-09 DIAGNOSIS — N189 Chronic kidney disease, unspecified: Secondary | ICD-10-CM | POA: Diagnosis not present

## 2016-04-09 DIAGNOSIS — I129 Hypertensive chronic kidney disease with stage 1 through stage 4 chronic kidney disease, or unspecified chronic kidney disease: Secondary | ICD-10-CM | POA: Insufficient documentation

## 2016-04-09 DIAGNOSIS — E7800 Pure hypercholesterolemia, unspecified: Secondary | ICD-10-CM | POA: Diagnosis not present

## 2016-04-09 DIAGNOSIS — F329 Major depressive disorder, single episode, unspecified: Secondary | ICD-10-CM | POA: Insufficient documentation

## 2016-04-09 DIAGNOSIS — I6523 Occlusion and stenosis of bilateral carotid arteries: Secondary | ICD-10-CM | POA: Insufficient documentation

## 2016-04-09 DIAGNOSIS — E1122 Type 2 diabetes mellitus with diabetic chronic kidney disease: Secondary | ICD-10-CM | POA: Insufficient documentation

## 2016-04-09 DIAGNOSIS — I208 Other forms of angina pectoris: Secondary | ICD-10-CM

## 2016-04-09 DIAGNOSIS — G4730 Sleep apnea, unspecified: Secondary | ICD-10-CM | POA: Diagnosis not present

## 2016-04-09 NOTE — Progress Notes (Signed)
HISTORY AND PHYSICAL     CC:  Carotid artery scan Referring Provider:  Josetta Huddle, MD  HPI: This is a 70 y.o. male who is a pt of Dr. Marlou Porch who presents today for follow up carotid artery scan.  He saw Dr. Scot Dock a year ago with < 40% stenosis on the right and 40-59% stenosis on the left.  He continues to be asymptomatic and denies any weakness or paralysis on either side of the body.  He denies any temporary blindness or speech issues.  He states he does have problems with benign positional vertigo and Dr. Inda Merlin has given him exercises for this.  He does not smoke and never has smoked.    He does have a hx of melanoma and had a PE with one of his surgeries and is now on life long anticoagulation.  He does take a statin for cholesterol management.  He is on a beta blocker and ARB for blood pressure control.  He takes a daily aspirin.  He does have hx of CABG in 1999.  He is on metformin and actos for diabetes.  His last hgb A1c was ~ 8 months ago and was 5.9.   Past Medical History  Diagnosis Date  . Diabetes mellitus   . Depression   . Pulmonary emboli (HCC)     tx. Coumadin-no problems now  . Hypercholesteremia   . Kidney stone   . Hypertension     [facility]  . Chronic renal insufficiency   . [facility] any tx[facility] for [facility].Radiation([facility]) also.  . Cataracts, bilateral   . Sleep apnea     no cpap use at this time(not in over a year).  . Coronary artery disease     '99 -stents, then CABPG-stents failed. Dr. Marlou Porch, cardiologist.    Past Surgical History  Procedure Laterality Date  . Coronary artery bypass graft      1999  . Melanomia      several skin ca removed-nose, left ankle, parotid gland left, right nodes-being followed annually..  . Cardiac catheterization      x3     last 1999  . Mass excision  04/15/2012    Procedure: EXCISION MASS;  Surgeon: [name], MD;  [facility];  Service: ENT;  Laterality: Left;   Left tonsil biopsy  . Cholecystectomy      lap. Cholecystectomy about 5-8 yrs ago  . Colonoscopy with propofol N/A 09/26/2014    Procedure: COLONOSCOPY WITH PROPOFOL;  Surgeon: [name], MD;  Location[facility];  Service: Endoscopy;  Laterality: N/A;    Allergies  Allergen Reactions  . Doxycycline Other (See Comments)    BPH can't void when taking   . Penicillins Rash    Current Outpatient Prescriptions  Medication Sig Dispense Refill  . amLODipine (NORVASC) 5 MG tablet Take 5 mg by mouth every morning.    [name] aspirin EC 81 MG tablet Take 81 mg by mouth every evening.    . B Complex-C (SUPER B COMPLEX PO) Take 1 tablet by mouth daily.    . cholecalciferol (VITAMIN D) 1000 UNITS tablet Take 2,000 Units by mouth every morning.     . citalopram (CELEXA) 40 MG tablet Take 40 mg by mouth every morning.    [name] co-enzyme Q-10 50 MG capsule Take 100 mg by mouth every morning.    . Cranberry 500 MG CAPS Take 500 mg by mouth daily.    [name]  fenofibrate micronized (LOFIBRA) 67 MG capsule TAKE 1 CAPSULE DAILY 90 capsule 3  . FLAXSEED, LINSEED, PO Take 2 tablets by mouth 2 (two) times daily.    . folic acid (FOLVITE) A999333 MCG tablet Take 400 mcg by mouth daily.    [name] gabapentin (NEURONTIN) 100 MG capsule Take 100 mg by mouth 2 (two) times daily.    [name] glyBURIDE (DIABETA) 5 MG tablet Take 5 mg by mouth every evening.    . metFORMIN (GLUCOPHAGE) 1000 MG tablet Take 1,000 mg by mouth 2 (two) times daily with a meal.    . metoprolol succinate (TOPROL-XL) 50 MG 24 hr tablet Take 50 mg by mouth every morning. Take with or immediately following a meal.    . mirabegron ER (MYRBETRIQ) 50 MG TB24 tablet Take 1 tablet by mouth at bedtime.    . Multiple Vitamin (MULITIVITAMIN WITH MINERALS) TABS Take 1 tablet by mouth every evening.    [name] omega-3 acid ethyl esters (LOVAZA) 1 g capsule Take 2 capsules (2 g total) by mouth 2 (two) times daily. 360 capsule 3  . pantoprazole (PROTONIX) 40 MG tablet Take 40 mg by  mouth daily.    . pioglitazone (ACTOS) 30 MG tablet Take 30 mg by mouth every morning.    . potassium gluconate 595 MG TABS tablet Take 595 mg by mouth daily.    . ranolazine (RANEXA) 500 MG 12 hr tablet Take 1 tablet (500 mg total) by mouth 2 (two) times daily. 180 tablet 3  . rosuvastatin (CRESTOR) 20 MG tablet Take 20 mg by mouth every evening.    . Tamsulosin HCl (FLOMAX) 0.4 MG CAPS Take 0.4 mg by mouth every evening.    . testosterone cypionate (DEPOTESTOTERONE CYPIONATE) 100 MG/ML injection Inject 150 mg into the muscle every 14 (fourteen) days. For IM use only    . valsartan (DIOVAN) 160 MG tablet Take 80 mg by mouth every morning.    . vardenafil (LEVITRA) 20 MG tablet Take 20 mg by mouth daily as needed. For E.D.    . vitamin C (ASCORBIC ACID) 500 MG tablet Take 1,000 mg by mouth every morning.    . warfarin (COUMADIN) 5 MG tablet Take 5 mg by mouth every evening.      No current facility-administered medications for this visit.    No family history on file.  Social History   Social History  . Marital Status: Married    Spouse Name: N/A  . Number of Children: N/A  . Years of Education: N/A   Occupational History  . Not on file.   Social History Main Topics  . Smoking status: Never Smoker   . Smokeless tobacco: Not on file  . Alcohol Use: No  . Drug Use: No  . Sexual Activity: Not on file   Other Topics Concern  . Not on file   Social History Narrative     REVIEW OF SYSTEMS:   [X]  denotes positive finding, [ ]  denotes negative finding Cardiac  Comments:  Chest pain or chest pressure:    Shortness of breath upon exertion:    Short of breath when lying flat:    Irregular heart rhythm: x       Vascular    Pain in calf, thigh, or hip brought on by ambulation:    Pain in feet at night that wakes you up from your sleep:  x Diabetic neuropathy  Blood clot in your veins:    Leg swelling:  Pulmonary    Oxygen at home:    Productive cough:       Wheezing:         Neurologic    Sudden weakness in arms or legs:     Sudden numbness in arms or legs:     Sudden onset of difficulty speaking or slurred speech:    Temporary loss of vision in one eye:     Problems with dizziness:         Gastrointestinal    Blood in stool:     Vomited blood:         Genitourinary    Burning when urinating:     Blood in urine:        Psychiatric    Major depression:         Hematologic    Bleeding problems:    Problems with blood clotting too easily:        Skin    Rashes or ulcers:        Constitutional    Fever or chills:      PHYSICAL EXAMINATION:  Filed Vitals:   04/09/16 1515 04/09/16 1518  BP: 122/73 110/60  Pulse: 75 75  Temp: 97 F (36.1 C)   Resp: 16    Body mass index is 33.86 kg/(m^2).  General:  WDWN in NAD; vital signs documented above Gait: Normal HENT: WNL, normocephalic Pulmonary: normal non-labored breathing , without Rales, rhonchi,  wheezing Cardiac: regular HR, without  Murmurs, rubs or gallops; without carotid bruits Abdomen: obese Skin: without rashes Vascular Exam/Pulses:  Right Left  DP Unable to palpate  2+ (normal)  PT 2+ (normal) 2+ (normal)   Extremities: without ischemic changes, without Gangrene , without cellulitis; without open wounds;  Musculoskeletal: no muscle wasting or atrophy  Neurologic: A&O X 3;  No focal weakness or paresthesias are detected Psychiatric:  The pt has Normal affect.   Non-Invasive Vascular Imaging:   Carotid duplex scan 04/09/16: 1.  Right ICA velocities suggestive of < 40% stenosis 2.  Left ICA velocities suggestive of 40-59% stenosis --no significant change from previous exam on 03/30/15  Pt meds includes: Statin:  Yes.   Beta Blocker:  Yes.   Aspirin:  Yes.   ACEI:  No. ARB:  Yes.   Other Antiplatelet/Anticoagulant:  Yes.   Coumadin   ASSESSMENT/PLAN:: 70 y.o. male with asymptomatic carotid artery stenosis who presents for his yearly carotid  duplex   -the pt remains asymptomatic and his carotid duplex is essentially unchanged from a year ago. -he is on a statin and daily aspirin, which he will continue. -continue strict control of diabetes and hypertension.  -lifelong anticoagulation for hx of PE -he will f/u in one year with carotid duplex scan.  He will call sooner if he has any issues.   Leontine Locket, PA-C [facility] [phone]  Clinic MD:  Pt seen and examined in conjunction with Dr. Scot Dock

## 2016-04-15 DIAGNOSIS — I739 Peripheral vascular disease, unspecified: Secondary | ICD-10-CM | POA: Diagnosis not present

## 2016-04-15 DIAGNOSIS — L603 Nail dystrophy: Secondary | ICD-10-CM | POA: Diagnosis not present

## 2016-04-15 DIAGNOSIS — L84 Corns and callosities: Secondary | ICD-10-CM | POA: Diagnosis not present

## 2016-04-15 DIAGNOSIS — E1151 Type 2 diabetes mellitus with diabetic peripheral angiopathy without gangrene: Secondary | ICD-10-CM | POA: Diagnosis not present

## 2016-04-18 ENCOUNTER — Other Ambulatory Visit: Payer: Self-pay | Admitting: Cardiology

## 2016-04-22 DIAGNOSIS — E291 Testicular hypofunction: Secondary | ICD-10-CM | POA: Diagnosis not present

## 2016-04-22 DIAGNOSIS — Z7901 Long term (current) use of anticoagulants: Secondary | ICD-10-CM | POA: Diagnosis not present

## 2016-05-06 DIAGNOSIS — E291 Testicular hypofunction: Secondary | ICD-10-CM | POA: Diagnosis not present

## 2016-05-20 DIAGNOSIS — E291 Testicular hypofunction: Secondary | ICD-10-CM | POA: Diagnosis not present

## 2016-05-20 DIAGNOSIS — I2690 Septic pulmonary embolism without acute cor pulmonale: Secondary | ICD-10-CM | POA: Diagnosis not present

## 2016-05-20 DIAGNOSIS — T800XXA Air embolism following infusion, transfusion and therapeutic injection, initial encounter: Secondary | ICD-10-CM | POA: Diagnosis not present

## 2016-05-20 DIAGNOSIS — Z7901 Long term (current) use of anticoagulants: Secondary | ICD-10-CM | POA: Diagnosis not present

## 2016-06-03 DIAGNOSIS — E291 Testicular hypofunction: Secondary | ICD-10-CM | POA: Diagnosis not present

## 2016-06-17 DIAGNOSIS — E291 Testicular hypofunction: Secondary | ICD-10-CM | POA: Diagnosis not present

## 2016-06-17 DIAGNOSIS — Z7901 Long term (current) use of anticoagulants: Secondary | ICD-10-CM | POA: Diagnosis not present

## 2016-06-24 DIAGNOSIS — L578 Other skin changes due to chronic exposure to nonionizing radiation: Secondary | ICD-10-CM | POA: Diagnosis not present

## 2016-06-24 DIAGNOSIS — D235 Other benign neoplasm of skin of trunk: Secondary | ICD-10-CM | POA: Diagnosis not present

## 2016-06-24 DIAGNOSIS — D1801 Hemangioma of skin and subcutaneous tissue: Secondary | ICD-10-CM | POA: Diagnosis not present

## 2016-06-24 DIAGNOSIS — L570 Actinic keratosis: Secondary | ICD-10-CM | POA: Diagnosis not present

## 2016-06-24 DIAGNOSIS — L821 Other seborrheic keratosis: Secondary | ICD-10-CM | POA: Diagnosis not present

## 2016-06-24 DIAGNOSIS — L219 Seborrheic dermatitis, unspecified: Secondary | ICD-10-CM | POA: Diagnosis not present

## 2016-06-24 DIAGNOSIS — Z85820 Personal history of malignant melanoma of skin: Secondary | ICD-10-CM | POA: Diagnosis not present

## 2016-06-25 ENCOUNTER — Other Ambulatory Visit: Payer: Self-pay | Admitting: Cardiology

## 2016-07-01 DIAGNOSIS — E291 Testicular hypofunction: Secondary | ICD-10-CM | POA: Diagnosis not present

## 2016-07-08 DIAGNOSIS — I739 Peripheral vascular disease, unspecified: Secondary | ICD-10-CM | POA: Diagnosis not present

## 2016-07-08 DIAGNOSIS — L603 Nail dystrophy: Secondary | ICD-10-CM | POA: Diagnosis not present

## 2016-07-08 DIAGNOSIS — L84 Corns and callosities: Secondary | ICD-10-CM | POA: Diagnosis not present

## 2016-07-08 DIAGNOSIS — E1151 Type 2 diabetes mellitus with diabetic peripheral angiopathy without gangrene: Secondary | ICD-10-CM | POA: Diagnosis not present

## 2016-07-15 DIAGNOSIS — E291 Testicular hypofunction: Secondary | ICD-10-CM | POA: Diagnosis not present

## 2016-07-15 DIAGNOSIS — Z7901 Long term (current) use of anticoagulants: Secondary | ICD-10-CM | POA: Diagnosis not present

## 2016-07-15 DIAGNOSIS — T800XXA Air embolism following infusion, transfusion and therapeutic injection, initial encounter: Secondary | ICD-10-CM | POA: Diagnosis not present

## 2016-07-15 DIAGNOSIS — I2690 Septic pulmonary embolism without acute cor pulmonale: Secondary | ICD-10-CM | POA: Diagnosis not present

## 2016-07-29 DIAGNOSIS — E291 Testicular hypofunction: Secondary | ICD-10-CM | POA: Diagnosis not present

## 2016-08-25 DIAGNOSIS — Z6834 Body mass index (BMI) 34.0-34.9, adult: Secondary | ICD-10-CM | POA: Diagnosis not present

## 2016-08-25 DIAGNOSIS — E559 Vitamin D deficiency, unspecified: Secondary | ICD-10-CM | POA: Diagnosis not present

## 2016-08-25 DIAGNOSIS — E669 Obesity, unspecified: Secondary | ICD-10-CM | POA: Diagnosis not present

## 2016-08-25 DIAGNOSIS — I25721 Atherosclerosis of autologous artery coronary artery bypass graft(s) with angina pectoris with documented spasm: Secondary | ICD-10-CM | POA: Diagnosis not present

## 2016-08-25 DIAGNOSIS — M109 Gout, unspecified: Secondary | ICD-10-CM | POA: Diagnosis not present

## 2016-08-25 DIAGNOSIS — E7800 Pure hypercholesterolemia, unspecified: Secondary | ICD-10-CM | POA: Diagnosis not present

## 2016-08-25 DIAGNOSIS — H8111 Benign paroxysmal vertigo, right ear: Secondary | ICD-10-CM | POA: Diagnosis not present

## 2016-08-25 DIAGNOSIS — I2699 Other pulmonary embolism without acute cor pulmonale: Secondary | ICD-10-CM | POA: Diagnosis not present

## 2016-08-25 DIAGNOSIS — G4761 Periodic limb movement disorder: Secondary | ICD-10-CM | POA: Diagnosis not present

## 2016-08-25 DIAGNOSIS — E1121 Type 2 diabetes mellitus with diabetic nephropathy: Secondary | ICD-10-CM | POA: Diagnosis not present

## 2016-08-25 DIAGNOSIS — E1142 Type 2 diabetes mellitus with diabetic polyneuropathy: Secondary | ICD-10-CM | POA: Diagnosis not present

## 2016-08-25 DIAGNOSIS — I10 Essential (primary) hypertension: Secondary | ICD-10-CM | POA: Diagnosis not present

## 2016-08-25 DIAGNOSIS — Z7984 Long term (current) use of oral hypoglycemic drugs: Secondary | ICD-10-CM | POA: Diagnosis not present

## 2016-08-25 DIAGNOSIS — Z86010 Personal history of colonic polyps: Secondary | ICD-10-CM | POA: Diagnosis not present

## 2016-08-25 DIAGNOSIS — Z79899 Other long term (current) drug therapy: Secondary | ICD-10-CM | POA: Diagnosis not present

## 2016-08-25 DIAGNOSIS — G4733 Obstructive sleep apnea (adult) (pediatric): Secondary | ICD-10-CM | POA: Diagnosis not present

## 2016-08-25 DIAGNOSIS — N419 Inflammatory disease of prostate, unspecified: Secondary | ICD-10-CM | POA: Diagnosis not present

## 2016-08-25 DIAGNOSIS — K219 Gastro-esophageal reflux disease without esophagitis: Secondary | ICD-10-CM | POA: Diagnosis not present

## 2016-08-26 DIAGNOSIS — E291 Testicular hypofunction: Secondary | ICD-10-CM | POA: Diagnosis not present

## 2016-08-28 ENCOUNTER — Other Ambulatory Visit: Payer: Self-pay | Admitting: Cardiology

## 2016-09-09 DIAGNOSIS — Z7901 Long term (current) use of anticoagulants: Secondary | ICD-10-CM | POA: Diagnosis not present

## 2016-09-09 DIAGNOSIS — E291 Testicular hypofunction: Secondary | ICD-10-CM | POA: Diagnosis not present

## 2016-09-09 DIAGNOSIS — M67442 Ganglion, left hand: Secondary | ICD-10-CM | POA: Diagnosis not present

## 2016-09-23 DIAGNOSIS — E291 Testicular hypofunction: Secondary | ICD-10-CM | POA: Diagnosis not present

## 2016-10-06 DIAGNOSIS — M65842 Other synovitis and tenosynovitis, left hand: Secondary | ICD-10-CM | POA: Diagnosis not present

## 2016-10-06 DIAGNOSIS — L728 Other follicular cysts of the skin and subcutaneous tissue: Secondary | ICD-10-CM | POA: Diagnosis not present

## 2016-10-06 DIAGNOSIS — D481 Neoplasm of uncertain behavior of connective and other soft tissue: Secondary | ICD-10-CM | POA: Diagnosis not present

## 2016-10-06 DIAGNOSIS — D492 Neoplasm of unspecified behavior of bone, soft tissue, and skin: Secondary | ICD-10-CM | POA: Diagnosis not present

## 2016-10-06 DIAGNOSIS — R2232 Localized swelling, mass and lump, left upper limb: Secondary | ICD-10-CM | POA: Diagnosis not present

## 2016-10-06 DIAGNOSIS — M25742 Osteophyte, left hand: Secondary | ICD-10-CM | POA: Diagnosis not present

## 2016-10-07 DIAGNOSIS — E291 Testicular hypofunction: Secondary | ICD-10-CM | POA: Diagnosis not present

## 2016-10-07 DIAGNOSIS — Z7901 Long term (current) use of anticoagulants: Secondary | ICD-10-CM | POA: Diagnosis not present

## 2016-10-21 DIAGNOSIS — E1151 Type 2 diabetes mellitus with diabetic peripheral angiopathy without gangrene: Secondary | ICD-10-CM | POA: Diagnosis not present

## 2016-10-21 DIAGNOSIS — E291 Testicular hypofunction: Secondary | ICD-10-CM | POA: Diagnosis not present

## 2016-10-21 DIAGNOSIS — L603 Nail dystrophy: Secondary | ICD-10-CM | POA: Diagnosis not present

## 2016-10-21 DIAGNOSIS — I739 Peripheral vascular disease, unspecified: Secondary | ICD-10-CM | POA: Diagnosis not present

## 2016-10-28 DIAGNOSIS — G4733 Obstructive sleep apnea (adult) (pediatric): Secondary | ICD-10-CM | POA: Diagnosis not present

## 2016-11-04 DIAGNOSIS — Z7901 Long term (current) use of anticoagulants: Secondary | ICD-10-CM | POA: Diagnosis not present

## 2016-11-04 DIAGNOSIS — E291 Testicular hypofunction: Secondary | ICD-10-CM | POA: Diagnosis not present

## 2016-11-18 DIAGNOSIS — E291 Testicular hypofunction: Secondary | ICD-10-CM | POA: Diagnosis not present

## 2016-11-24 DIAGNOSIS — G4733 Obstructive sleep apnea (adult) (pediatric): Secondary | ICD-10-CM | POA: Diagnosis not present

## 2016-11-25 DIAGNOSIS — G4733 Obstructive sleep apnea (adult) (pediatric): Secondary | ICD-10-CM | POA: Diagnosis not present

## 2016-12-02 DIAGNOSIS — E291 Testicular hypofunction: Secondary | ICD-10-CM | POA: Diagnosis not present

## 2016-12-23 DIAGNOSIS — Z7901 Long term (current) use of anticoagulants: Secondary | ICD-10-CM | POA: Diagnosis not present

## 2016-12-23 DIAGNOSIS — L820 Inflamed seborrheic keratosis: Secondary | ICD-10-CM | POA: Diagnosis not present

## 2016-12-23 DIAGNOSIS — L570 Actinic keratosis: Secondary | ICD-10-CM | POA: Diagnosis not present

## 2016-12-23 DIAGNOSIS — L821 Other seborrheic keratosis: Secondary | ICD-10-CM | POA: Diagnosis not present

## 2016-12-23 DIAGNOSIS — L814 Other melanin hyperpigmentation: Secondary | ICD-10-CM | POA: Diagnosis not present

## 2016-12-23 DIAGNOSIS — Z85820 Personal history of malignant melanoma of skin: Secondary | ICD-10-CM | POA: Diagnosis not present

## 2016-12-23 DIAGNOSIS — D235 Other benign neoplasm of skin of trunk: Secondary | ICD-10-CM | POA: Diagnosis not present

## 2017-01-06 DIAGNOSIS — E291 Testicular hypofunction: Secondary | ICD-10-CM | POA: Diagnosis not present

## 2017-01-09 ENCOUNTER — Telehealth: Payer: Self-pay

## 2017-01-09 NOTE — Telephone Encounter (Signed)
Received a PA form from [place] for the pts [employer]. Per Jeannene Patella, Dr Marlou Porch nurse, the pts PCP manages the pts hypertriglyceridemia.  Faxed back to [facility] with message to refer to the pts PCP.

## 2017-02-03 DIAGNOSIS — E291 Testicular hypofunction: Secondary | ICD-10-CM | POA: Diagnosis not present

## 2017-02-09 DIAGNOSIS — E119 Type 2 diabetes mellitus without complications: Secondary | ICD-10-CM | POA: Diagnosis not present

## 2017-02-09 DIAGNOSIS — H2513 Age-related nuclear cataract, bilateral: Secondary | ICD-10-CM | POA: Diagnosis not present

## 2017-02-09 DIAGNOSIS — G4733 Obstructive sleep apnea (adult) (pediatric): Secondary | ICD-10-CM | POA: Diagnosis not present

## 2017-02-24 DIAGNOSIS — E291 Testicular hypofunction: Secondary | ICD-10-CM | POA: Diagnosis not present

## 2017-02-24 DIAGNOSIS — Z7901 Long term (current) use of anticoagulants: Secondary | ICD-10-CM | POA: Diagnosis not present

## 2017-03-01 ENCOUNTER — Other Ambulatory Visit: Payer: Self-pay | Admitting: Cardiology

## 2017-03-02 ENCOUNTER — Encounter: Payer: Self-pay | Admitting: Cardiology

## 2017-03-02 ENCOUNTER — Ambulatory Visit (INDEPENDENT_AMBULATORY_CARE_PROVIDER_SITE_OTHER): Payer: 59 | Admitting: Cardiology

## 2017-03-02 VITALS — BP 122/76 | HR 70 | Ht 70.0 in | Wt 236.0 lb

## 2017-03-02 DIAGNOSIS — I2510 Atherosclerotic heart disease of native coronary artery without angina pectoris: Secondary | ICD-10-CM

## 2017-03-02 DIAGNOSIS — I208 Other forms of angina pectoris: Secondary | ICD-10-CM

## 2017-03-02 DIAGNOSIS — E7800 Pure hypercholesterolemia, unspecified: Secondary | ICD-10-CM

## 2017-03-02 DIAGNOSIS — I10 Essential (primary) hypertension: Secondary | ICD-10-CM | POA: Diagnosis not present

## 2017-03-02 MED ORDER — METOPROLOL SUCCINATE ER 50 MG PO TB24: 50.0000 mg | ORAL_TABLET | Freq: Every day | ORAL | 3 refills | Status: DC

## 2017-03-02 NOTE — Progress Notes (Signed)
[place]. [address] [place], [place]  [address] Phone: [phone] Fax:  [phone]  Date:  03/02/2017   ID:  Evan [name], DOB 1946/04/24, MRN [id number]  PCP:  Josetta Huddle, MD   History of Present Illness: Evan [name] is a 71 y.o. male with coronary artery disease status post bypass surgery in 1999 with subsequent cardiac catheterization in 2000 showing occluded diagonal graft occluded RCA graft, patent LIMA to LAD. In 2010 stress test showed no ischemia but he was having angina on the treadmill and we tried Ranexa for a while. At a previous visit in 2017 we stopped Ranexa and he has been doing quite well without it.  He also has chronic anticoagulation with Coumadin because of prior PE bilateral in the setting of melanoma resection from his neck several years ago, 2004. Dr. Inda Merlin has been keeping him on this lifelong.  He has been battling with his weight. It is harder and harder for him to lose weight. He enjoys golfing, still works at the [employer], knows [employer].  He is on multiple medications to help with his lipids and triglycerides.  Dr. Gae Gallop has been watching his carotid arteries.    Wt Readings from Last 3 Encounters:  03/02/17 236 lb (107 kg)  04/09/16 236 lb (107 kg)  02/18/16 234 lb 3.2 oz (106.2 kg)     Past Medical History:  Diagnosis Date  . [facility] any tx[facility] for [facility].Radiation([facility]) also.  . Cataracts, bilateral   . Chronic renal insufficiency   . Coronary artery disease    '99 -stents, then CABPG-stents failed. Dr. Marlou Porch, cardiologist.  . Depression   . Diabetes mellitus   . Hypercholesteremia   . Hypertension    [name]  . Kidney stone   . Pulmonary emboli (HCC)    tx. Coumadin-no problems now  . Sleep apnea    no cpap use at this time(not in over a year).    Past Surgical History:  Procedure Laterality Date  . CARDIAC CATHETERIZATION     x3      last 1999  . CHOLECYSTECTOMY     lap. Cholecystectomy about 5-8 yrs ago  . COLONOSCOPY WITH PROPOFOL N/A 09/26/2014   Procedure: COLONOSCOPY WITH PROPOFOL;  Surgeon: [name], MD;  Location: [facility];  Service: Endoscopy;  Laterality: N/A;  . CORONARY ARTERY BYPASS GRAFT     1999  . MASS EXCISION  04/15/2012   Procedure: EXCISION MASS;  Surgeon: [name], MD;  [facility]: [facility];  Service: ENT;  Laterality: Left;  Left tonsil biopsy  . melanomia     several skin ca removed-nose, left ankle, parotid gland left, right nodes-being followed annually..    Current Outpatient Prescriptions  Medication Sig Dispense Refill  . amLODipine (NORVASC) 5 MG tablet TAKE 1 TABLET DAILY 90 tablet 3  . aspirin EC 81 MG tablet Take 81 mg by mouth every evening.    . B Complex-C (SUPER B COMPLEX PO) Take 1 tablet by mouth daily.    . cholecalciferol (VITAMIN D) 1000 UNITS tablet Take 2,000 Units by mouth every morning.     . citalopram (CELEXA) 40 MG tablet Take 40 mg by mouth every morning.    [name] co-enzyme Q-10 50 MG capsule Take 100 mg by mouth every morning.    . Cranberry 500 MG CAPS Take 500 mg by mouth daily.    [name]  fenofibrate micronized (LOFIBRA) 67 MG capsule TAKE 1 CAPSULE DAILY 90 capsule 1  . FLAXSEED, LINSEED, PO Take 2 tablets by mouth 2 (two) times daily.    . folic acid (FOLVITE) 509 MCG tablet Take 400 mcg by mouth daily.    [name] gabapentin (NEURONTIN) 100 MG capsule Take 100 mg by mouth 2 (two) times daily.    [name] glyBURIDE (DIABETA) 5 MG tablet Take 5 mg by mouth every evening.    . metFORMIN (GLUCOPHAGE) 1000 MG tablet Take 1,000 mg by mouth 2 (two) times daily with a meal.    . metoprolol succinate (TOPROL-XL) 50 MG 24 hr tablet Take 1 tablet (50 mg total) by mouth daily. Take with or immediately following a meal. 90 tablet 3  . mirabegron ER (MYRBETRIQ) 50 MG TB24 tablet Take 1 tablet by mouth at bedtime.    . Multiple Vitamin (MULITIVITAMIN WITH MINERALS) TABS Take 1 tablet by  mouth every evening.    [name] omega-3 acid ethyl esters (LOVAZA) 1 g capsule Take 2 capsules (2 g total) by mouth 2 (two) times daily. 360 capsule 3  . pantoprazole (PROTONIX) 40 MG tablet Take 40 mg by mouth daily.    . pioglitazone (ACTOS) 30 MG tablet Take 30 mg by mouth every morning.    . potassium gluconate 595 MG TABS tablet Take 595 mg by mouth daily.    . rosuvastatin (CRESTOR) 20 MG tablet Take 20 mg by mouth every evening.    . Tamsulosin HCl (FLOMAX) 0.4 MG CAPS Take 0.4 mg by mouth every evening.    . testosterone cypionate (DEPOTESTOTERONE CYPIONATE) 100 MG/ML injection Inject 150 mg into the muscle every 14 (fourteen) days. For IM use only    . valsartan (DIOVAN) 160 MG tablet Take 80 mg by mouth every morning.    . vardenafil (LEVITRA) 20 MG tablet Take 20 mg by mouth daily as needed. For E.D.    . vitamin C (ASCORBIC ACID) 500 MG tablet Take 1,000 mg by mouth every morning.    . warfarin (COUMADIN) 5 MG tablet Take 5 mg by mouth every evening.      No current facility-administered medications for this visit.     Allergies:    Allergies  Allergen Reactions  . Doxycycline Other (See Comments)    BPH can't void when taking   . Penicillins Rash    Social History:  The patient  reports that he has never smoked. He has never used smokeless tobacco. He reports that he does not drink alcohol or use drugs.   ROS: Denies any chest pain, shortness of breath, fevers, chills. All other review of systems negative.  PHYSICAL EXAM: VS:  BP 122/76   Pulse 70   Ht 5\' 10"  (1.778 m)   Wt 236 lb (107 kg)   BMI 33.86 kg/m  GEN: Well nourished, well developed, in no acute distress  HEENT: normal  Neck: no JVD, carotid bruits, or masses Cardiac: RRR; no murmurs, rubs, or gallops,no edema  Respiratory:  clear to auscultation bilaterally, normal work of breathing GI: soft, nontender, nondistended, + BS, obese MS: no deformity or atrophy  Skin: warm and dry, no rash Neuro:  Alert and  Oriented x 3, Strength and sensation are intact Psych: euthymic mood, full affect   EKG: EKG ordered today. 03/02/17-sinus rhythm left axis deviation, left bundle branch block like appearance personally viewed no changes-prior 02/18/16-sinus rhythm, 80, left axis deviation, nonspecific intraventricular conduction of leg, nonspecific ST-T wave changes. Personally viewed-prior  02/16/15-normal sinus rhythm, 87, vertical axis, right bundle branch block like morphology, old septal infarct pattern, old inferior infarct pattern. Prior Normal sinus rhythm, 80, left axis deviation, possible septal infarct pattern, poor R wave progression old lateral infarct pattern. Old inferior infarct pattern.  ECHO 02/27/15 - Left ventricle: The cavity size was mildly dilated. Wall  thickness was normal. Systolic function was normal. The estimated  ejection fraction was in the range of 55% to 60%. Wall motion was  normal; there were no regional wall motion abnormalities.  Labs:  (followed by Dr. Inda Merlin)  ASSESSMENT AND PLAN:    1. Coronary artery disease-status post bypass surgery. Occluded SVG to RCA graft, occluded diagonal. Patent LIMA to LAD. EF is normal 55-60%. Overall doing well. 2. Angina-fairly well controlled. Medications reviewed. I'm going to try to taper off his Ranexa to see if this is not needed. Take for 1 week once a day and then stop. He will call us if any changes occur. He has done well off of the Ranexa. No angina. 3. Hypertension-Medications reviewed. Good control. No changes. 4. Hyperlipidemia-LDL goal less than 70. He is also on fish oil, fenofibrate. 5. Carotid artery disease-right 20-39% stenosis, left 60-79% mid left ICA stenosis. The study was on 11/14. He had been seeing Dr. Nicola Girt at [facility]. Now Dr. Scot Dock with [facility] monitoring his carotid artery disease. Continue with aggressive secondary prevention, statin, aspirin. No changes. 6. Chronic anticoagulation-Dr. Gates-Coumadin-PE,  lifelong since 2004. 7. History of melanoma-has had lymph nodes in neck  Resection. This may complicate carotid endarterectomy if this is needed. 8. One-year follow-up  Signed, Candee Furbish, MD [facility]  03/02/2017 9:42 AM

## 2017-03-02 NOTE — Patient Instructions (Signed)

## 2017-03-03 DIAGNOSIS — I25118 Atherosclerotic heart disease of native coronary artery with other forms of angina pectoris: Secondary | ICD-10-CM | POA: Diagnosis not present

## 2017-03-03 DIAGNOSIS — I2699 Other pulmonary embolism without acute cor pulmonale: Secondary | ICD-10-CM | POA: Diagnosis not present

## 2017-03-03 DIAGNOSIS — I10 Essential (primary) hypertension: Secondary | ICD-10-CM | POA: Diagnosis not present

## 2017-03-03 DIAGNOSIS — F322 Major depressive disorder, single episode, severe without psychotic features: Secondary | ICD-10-CM | POA: Diagnosis not present

## 2017-03-03 DIAGNOSIS — E559 Vitamin D deficiency, unspecified: Secondary | ICD-10-CM | POA: Diagnosis not present

## 2017-03-03 DIAGNOSIS — Z85820 Personal history of malignant melanoma of skin: Secondary | ICD-10-CM | POA: Diagnosis not present

## 2017-03-03 DIAGNOSIS — Z7901 Long term (current) use of anticoagulants: Secondary | ICD-10-CM | POA: Diagnosis not present

## 2017-03-03 DIAGNOSIS — I6523 Occlusion and stenosis of bilateral carotid arteries: Secondary | ICD-10-CM | POA: Diagnosis not present

## 2017-03-03 DIAGNOSIS — Z7984 Long term (current) use of oral hypoglycemic drugs: Secondary | ICD-10-CM | POA: Diagnosis not present

## 2017-03-03 DIAGNOSIS — Z6833 Body mass index (BMI) 33.0-33.9, adult: Secondary | ICD-10-CM | POA: Diagnosis not present

## 2017-03-03 DIAGNOSIS — E1121 Type 2 diabetes mellitus with diabetic nephropathy: Secondary | ICD-10-CM | POA: Diagnosis not present

## 2017-03-03 DIAGNOSIS — E669 Obesity, unspecified: Secondary | ICD-10-CM | POA: Diagnosis not present

## 2017-03-03 DIAGNOSIS — E291 Testicular hypofunction: Secondary | ICD-10-CM | POA: Diagnosis not present

## 2017-03-10 DIAGNOSIS — E291 Testicular hypofunction: Secondary | ICD-10-CM | POA: Diagnosis not present

## 2017-03-24 ENCOUNTER — Other Ambulatory Visit: Payer: Self-pay | Admitting: Cardiology

## 2017-03-24 DIAGNOSIS — Z7901 Long term (current) use of anticoagulants: Secondary | ICD-10-CM | POA: Diagnosis not present

## 2017-03-24 DIAGNOSIS — E291 Testicular hypofunction: Secondary | ICD-10-CM | POA: Diagnosis not present

## 2017-03-30 ENCOUNTER — Other Ambulatory Visit: Payer: Self-pay | Admitting: Cardiology

## 2017-04-07 DIAGNOSIS — I2510 Atherosclerotic heart disease of native coronary artery without angina pectoris: Secondary | ICD-10-CM | POA: Diagnosis not present

## 2017-04-07 DIAGNOSIS — E119 Type 2 diabetes mellitus without complications: Secondary | ICD-10-CM | POA: Diagnosis not present

## 2017-04-07 DIAGNOSIS — Z7982 Long term (current) use of aspirin: Secondary | ICD-10-CM | POA: Diagnosis not present

## 2017-04-07 DIAGNOSIS — Z08 Encounter for follow-up examination after completed treatment for malignant neoplasm: Secondary | ICD-10-CM | POA: Diagnosis not present

## 2017-04-07 DIAGNOSIS — Z9889 Other specified postprocedural states: Secondary | ICD-10-CM | POA: Diagnosis not present

## 2017-04-07 DIAGNOSIS — Z85820 Personal history of malignant melanoma of skin: Secondary | ICD-10-CM | POA: Diagnosis not present

## 2017-04-07 DIAGNOSIS — Z881 Allergy status to other antibiotic agents status: Secondary | ICD-10-CM | POA: Diagnosis not present

## 2017-04-07 DIAGNOSIS — Z79899 Other long term (current) drug therapy: Secondary | ICD-10-CM | POA: Diagnosis not present

## 2017-04-07 DIAGNOSIS — Z880 Allergy status to penicillin: Secondary | ICD-10-CM | POA: Diagnosis not present

## 2017-04-07 DIAGNOSIS — Z7984 Long term (current) use of oral hypoglycemic drugs: Secondary | ICD-10-CM | POA: Diagnosis not present

## 2017-04-07 DIAGNOSIS — I10 Essential (primary) hypertension: Secondary | ICD-10-CM | POA: Diagnosis not present

## 2017-04-07 DIAGNOSIS — E782 Mixed hyperlipidemia: Secondary | ICD-10-CM | POA: Diagnosis not present

## 2017-04-07 DIAGNOSIS — E291 Testicular hypofunction: Secondary | ICD-10-CM | POA: Diagnosis not present

## 2017-04-07 DIAGNOSIS — E7800 Pure hypercholesterolemia, unspecified: Secondary | ICD-10-CM | POA: Diagnosis not present

## 2017-04-07 DIAGNOSIS — C4372 Malignant melanoma of left lower limb, including hip: Secondary | ICD-10-CM | POA: Diagnosis not present

## 2017-04-08 ENCOUNTER — Encounter: Payer: Self-pay | Admitting: Vascular Surgery

## 2017-04-16 ENCOUNTER — Other Ambulatory Visit: Payer: Self-pay

## 2017-04-16 DIAGNOSIS — I779 Disorder of arteries and arterioles, unspecified: Secondary | ICD-10-CM

## 2017-04-16 DIAGNOSIS — I739 Peripheral vascular disease, unspecified: Principal | ICD-10-CM

## 2017-04-22 ENCOUNTER — Ambulatory Visit (HOSPITAL_COMMUNITY)
Admission: RE | Admit: 2017-04-22 | Discharge: 2017-04-22 | Disposition: A | Discharge status: Home / Self Care | Payer: 59 | Source: Ambulatory Visit | Attending: Vascular Surgery | Admitting: Vascular Surgery

## 2017-04-22 ENCOUNTER — Encounter: Payer: Self-pay | Admitting: Vascular Surgery

## 2017-04-22 ENCOUNTER — Ambulatory Visit (INDEPENDENT_AMBULATORY_CARE_PROVIDER_SITE_OTHER): Payer: 59 | Admitting: Vascular Surgery

## 2017-04-22 VITALS — BP 114/77 | HR 69 | Temp 98.4°F | Resp 20 | Ht 70.0 in | Wt 239.0 lb

## 2017-04-22 DIAGNOSIS — I6523 Occlusion and stenosis of bilateral carotid arteries: Secondary | ICD-10-CM | POA: Insufficient documentation

## 2017-04-22 DIAGNOSIS — I779 Disorder of arteries and arterioles, unspecified: Secondary | ICD-10-CM | POA: Diagnosis not present

## 2017-04-22 DIAGNOSIS — I208 Other forms of angina pectoris: Secondary | ICD-10-CM

## 2017-04-22 DIAGNOSIS — I739 Peripheral vascular disease, unspecified: Secondary | ICD-10-CM

## 2017-04-22 LAB — VAS US CAROTID
LCCADSYS: -68 cm/s
LCCAPSYS: -77 cm/s
LEFT ECA DIAS: 23 cm/s
LICADSYS: -114 cm/s
Left CCA dist dias: -20 cm/s
Left CCA prox dias: -16 cm/s
Left ICA dist dias: -34 cm/s
Left ICA prox dias: -21 cm/s
Left ICA prox sys: -75 cm/s
RIGHT CCA MID DIAS: 14 cm/s
RIGHT ECA DIAS: -16 cm/s
Right CCA prox dias: 16 cm/s
Right CCA prox sys: 68 cm/s
Right cca dist sys: -84 cm/s

## 2017-04-22 NOTE — Progress Notes (Signed)
Patient name: Evan [name] MRN: [id number] DOB: [birth year] Sex: male  REASON FOR VISIT:    Follow up of carotid disease.  HPI:   Evan [name] is a pleasant 71 y.o. male who I last saw on 03/30/2015 with carotid disease. At that time the patient had a less than 40% right carotid stenosis with a 40-59% left carotid stenosis. A follow up study in July 2017 showed again a less than 40% right carotid stenosis with a 40-59% left carotid stenosis. He comes in for a 1 year follow up visit.  Since I saw him last, he denies any history of stroke, TIAs, expressive or receptive aphasia, or amaurosis fugax.  He is not a smoker. He is on aspirin and is on a statin.  Past Medical History:  Diagnosis Date  . [facility] since any tx[facility] for melanoma.Radiation([facility]) also.  . Cataracts, bilateral   . Chronic renal insufficiency   . Coronary artery disease    '99 -stents, then CABPG-stents failed. Dr. Marlou Porch, cardiologist.  . Depression   . Diabetes mellitus   . Hypercholesteremia   . Hypertension    Evan [name]  . Kidney stone   . Pulmonary emboli (HCC)    tx. Coumadin-no problems now  . Sleep apnea    no cpap use at this time(not in over a year).    No family history on file.  SOCIAL HISTORY: Social History  Substance Use Topics  . Smoking status: Never Smoker  . Smokeless tobacco: Never Used  . Alcohol use No    Allergies  Allergen Reactions  . Doxycycline Other (See Comments)    BPH can't void when taking   . Penicillins Rash    Current Outpatient Prescriptions  Medication Sig Dispense Refill  . amLODipine (NORVASC) 5 MG tablet Take 1 tablet (5 mg total) by mouth daily. 90 tablet 3  . aspirin EC 81 MG tablet Take 81 mg by mouth every evening.    . B Complex-C (SUPER B COMPLEX PO) Take 1 tablet by mouth daily.    . cholecalciferol (VITAMIN D) 1000 UNITS tablet Take 2,000 Units by mouth every morning.     . citalopram  (CELEXA) 40 MG tablet Take 40 mg by mouth every morning.    [name] co-enzyme Q-10 50 MG capsule Take 100 mg by mouth every morning.    . Cranberry 500 MG CAPS Take 500 mg by mouth daily.    . fenofibrate micronized (LOFIBRA) 67 MG capsule TAKE 1 CAPSULE DAILY 90 capsule 1  . FLAXSEED, LINSEED, PO Take 2 tablets by mouth 2 (two) times daily.    . folic acid (FOLVITE) 109 MCG tablet Take 400 mcg by mouth daily.    [name] gabapentin (NEURONTIN) 100 MG capsule Take 100 mg by mouth 2 (two) times daily.    [name] glyBURIDE (DIABETA) 5 MG tablet Take 5 mg by mouth every evening.    . metFORMIN (GLUCOPHAGE) 1000 MG tablet Take 1,000 mg by mouth 2 (two) times daily with a meal.    . metoprolol succinate (TOPROL-XL) 50 MG 24 hr tablet Take 1 tablet (50 mg total) by mouth daily. Take with or immediately following a meal. 90 tablet 3  . mirabegron ER (MYRBETRIQ) 50 MG TB24 tablet Take 1 tablet by mouth at bedtime.    . Multiple Vitamin (MULITIVITAMIN WITH MINERALS) TABS Take 1 tablet by mouth every evening.    [name] omega-3 acid ethyl esters (LOVAZA) 1  g capsule Take 2 capsules (2 g total) by mouth 2 (two) times daily. 360 capsule 3  . pantoprazole (PROTONIX) 40 MG tablet Take 40 mg by mouth daily.    . pioglitazone (ACTOS) 30 MG tablet Take 30 mg by mouth every morning.    . potassium gluconate 595 MG TABS tablet Take 595 mg by mouth daily.    . rosuvastatin (CRESTOR) 20 MG tablet Take 20 mg by mouth every evening.    . Tamsulosin HCl (FLOMAX) 0.4 MG CAPS Take 0.4 mg by mouth every evening.    . testosterone cypionate (DEPOTESTOTERONE CYPIONATE) 100 MG/ML injection Inject 150 mg into the muscle every 14 (fourteen) days. For IM use only    . valsartan (DIOVAN) 160 MG tablet Take 80 mg by mouth every morning.    . vardenafil (LEVITRA) 20 MG tablet Take 20 mg by mouth daily as needed. For E.D.    . vitamin C (ASCORBIC ACID) 500 MG tablet Take 1,000 mg by mouth every morning.    . warfarin (COUMADIN) 5 MG tablet Take 5 mg by  mouth every evening.      No current facility-administered medications for this visit.     REVIEW OF SYSTEMS:  [X]  denotes positive finding, [ ]  denotes negative finding Cardiac  Comments:  Chest pain or chest pressure:    Shortness of breath upon exertion: X   Short of breath when lying flat:    Irregular heart rhythm:        Vascular    Pain in calf, thigh, or hip brought on by ambulation: X Mostly the right hip.   Pain in feet at night that wakes you up from your sleep:     Blood clot in your veins:    Leg swelling:         Pulmonary    Oxygen at home:    Productive cough:     Wheezing:         Neurologic    Sudden weakness in arms or legs:     Sudden numbness in arms or legs:     Sudden onset of difficulty speaking or slurred speech:    Temporary loss of vision in one eye:     Problems with dizziness:         Gastrointestinal    Blood in stool:     Vomited blood:         Genitourinary    Burning when urinating:     Blood in urine:        Psychiatric    Major depression:         Hematologic    Bleeding problems:    Problems with blood clotting too easily:        Skin    Rashes or ulcers:        Constitutional    Fever or chills:     PHYSICAL EXAM:   Vitals:   04/22/17 1050 04/22/17 1053  BP: 103/69 114/77  Pulse: 69   Resp: 20   Temp: 98.4 F (36.9 C)   TempSrc: Oral   SpO2: 95%   Weight: 239 lb (108.4 kg)   Height: 5\' 10"  (1.778 m)     GENERAL: The patient is a well-nourished male, in no acute distress. The vital signs are documented above. CARDIAC: There is a regular rate and rhythm.  VASCULAR: I do not detect carotid bruits. He has palpable femoral and posterior tibial pulses bilaterally. He has no significant lower  extremity swelling. PULMONARY: There is good air exchange bilaterally without wheezing or rales. ABDOMEN: Soft and non-tender with normal pitched bowel sounds.  MUSCULOSKELETAL: There are no major deformities or  cyanosis. NEUROLOGIC: No focal weakness or paresthesias are detected. SKIN: There are no ulcers or rashes noted. PSYCHIATRIC: The patient has a normal affect.  DATA:    CAROTID DUPLEX: I have independently interpreted his carotid duplex scan today. He has a 40-59% left carotid stenosis and also a 40-59% right carotid stenosis. The stenosis on the right is in the lower end of that range.   MEDICAL ISSUES:   BILATERAL CAROTID DISEASE:  He has mild bilateral carotid disease. I've explained that we would not consider carotid endarterectomy and was the stenosis progressed to greater than 80% or he develop new neurologic symptoms. I have ordered a follow carotid duplex scan in 1 year and I'll see him back at that time. He knows to call sooner if he has problems. He is on aspirin and is on a statin.  Deitra Mayo [facility] [phone]

## 2017-05-05 DIAGNOSIS — E291 Testicular hypofunction: Secondary | ICD-10-CM | POA: Diagnosis not present

## 2017-05-07 ENCOUNTER — Other Ambulatory Visit: Payer: Self-pay

## 2017-05-07 MED ORDER — OMEGA-3-ACID ETHYL ESTERS 1 G PO CAPS: 2.0000 | ORAL_CAPSULE | Freq: Two times a day (BID) | ORAL | 2 refills | Status: DC

## 2017-05-19 DIAGNOSIS — E291 Testicular hypofunction: Secondary | ICD-10-CM | POA: Diagnosis not present

## 2017-05-19 DIAGNOSIS — Z7901 Long term (current) use of anticoagulants: Secondary | ICD-10-CM | POA: Diagnosis not present

## 2017-05-20 NOTE — Addendum Note (Signed)
Addended by: Lianne Cure A on: 05/20/2017 01:05 PM   Modules accepted: Orders

## 2017-06-02 DIAGNOSIS — E291 Testicular hypofunction: Secondary | ICD-10-CM | POA: Diagnosis not present

## 2017-06-23 DIAGNOSIS — L821 Other seborrheic keratosis: Secondary | ICD-10-CM | POA: Diagnosis not present

## 2017-06-23 DIAGNOSIS — L820 Inflamed seborrheic keratosis: Secondary | ICD-10-CM | POA: Diagnosis not present

## 2017-06-23 DIAGNOSIS — Z85820 Personal history of malignant melanoma of skin: Secondary | ICD-10-CM | POA: Diagnosis not present

## 2017-06-23 DIAGNOSIS — L570 Actinic keratosis: Secondary | ICD-10-CM | POA: Diagnosis not present

## 2017-06-23 DIAGNOSIS — D229 Melanocytic nevi, unspecified: Secondary | ICD-10-CM | POA: Diagnosis not present

## 2017-06-30 DIAGNOSIS — E291 Testicular hypofunction: Secondary | ICD-10-CM | POA: Diagnosis not present

## 2017-07-14 DIAGNOSIS — E291 Testicular hypofunction: Secondary | ICD-10-CM | POA: Diagnosis not present

## 2017-07-14 DIAGNOSIS — Z7901 Long term (current) use of anticoagulants: Secondary | ICD-10-CM | POA: Diagnosis not present

## 2017-07-28 DIAGNOSIS — L578 Other skin changes due to chronic exposure to nonionizing radiation: Secondary | ICD-10-CM | POA: Diagnosis not present

## 2017-07-28 DIAGNOSIS — L570 Actinic keratosis: Secondary | ICD-10-CM | POA: Diagnosis not present

## 2017-07-28 DIAGNOSIS — L218 Other seborrheic dermatitis: Secondary | ICD-10-CM | POA: Diagnosis not present

## 2017-07-28 DIAGNOSIS — C44622 Squamous cell carcinoma of skin of right upper limb, including shoulder: Secondary | ICD-10-CM | POA: Diagnosis not present

## 2017-07-28 DIAGNOSIS — E291 Testicular hypofunction: Secondary | ICD-10-CM | POA: Diagnosis not present

## 2017-07-28 DIAGNOSIS — L821 Other seborrheic keratosis: Secondary | ICD-10-CM | POA: Diagnosis not present

## 2017-07-28 DIAGNOSIS — D485 Neoplasm of uncertain behavior of skin: Secondary | ICD-10-CM | POA: Diagnosis not present

## 2017-07-28 DIAGNOSIS — L820 Inflamed seborrheic keratosis: Secondary | ICD-10-CM | POA: Diagnosis not present

## 2017-08-11 DIAGNOSIS — Z7901 Long term (current) use of anticoagulants: Secondary | ICD-10-CM | POA: Diagnosis not present

## 2017-08-11 DIAGNOSIS — E291 Testicular hypofunction: Secondary | ICD-10-CM | POA: Diagnosis not present

## 2017-08-11 DIAGNOSIS — C44622 Squamous cell carcinoma of skin of right upper limb, including shoulder: Secondary | ICD-10-CM | POA: Diagnosis not present

## 2017-09-01 DIAGNOSIS — L739 Follicular disorder, unspecified: Secondary | ICD-10-CM | POA: Diagnosis not present

## 2017-09-01 DIAGNOSIS — I25721 Atherosclerosis of autologous artery coronary artery bypass graft(s) with angina pectoris with documented spasm: Secondary | ICD-10-CM | POA: Diagnosis not present

## 2017-09-01 DIAGNOSIS — E559 Vitamin D deficiency, unspecified: Secondary | ICD-10-CM | POA: Diagnosis not present

## 2017-09-01 DIAGNOSIS — I2699 Other pulmonary embolism without acute cor pulmonale: Secondary | ICD-10-CM | POA: Diagnosis not present

## 2017-09-01 DIAGNOSIS — E1121 Type 2 diabetes mellitus with diabetic nephropathy: Secondary | ICD-10-CM | POA: Diagnosis not present

## 2017-09-01 DIAGNOSIS — E7800 Pure hypercholesterolemia, unspecified: Secondary | ICD-10-CM | POA: Diagnosis not present

## 2017-09-01 DIAGNOSIS — I10 Essential (primary) hypertension: Secondary | ICD-10-CM | POA: Diagnosis not present

## 2017-09-01 DIAGNOSIS — Z7984 Long term (current) use of oral hypoglycemic drugs: Secondary | ICD-10-CM | POA: Diagnosis not present

## 2017-09-01 DIAGNOSIS — I25118 Atherosclerotic heart disease of native coronary artery with other forms of angina pectoris: Secondary | ICD-10-CM | POA: Diagnosis not present

## 2017-09-01 DIAGNOSIS — R351 Nocturia: Secondary | ICD-10-CM | POA: Diagnosis not present

## 2017-09-01 DIAGNOSIS — F322 Major depressive disorder, single episode, severe without psychotic features: Secondary | ICD-10-CM | POA: Diagnosis not present

## 2017-09-01 DIAGNOSIS — E669 Obesity, unspecified: Secondary | ICD-10-CM | POA: Diagnosis not present

## 2017-09-01 DIAGNOSIS — M109 Gout, unspecified: Secondary | ICD-10-CM | POA: Diagnosis not present

## 2017-09-08 DIAGNOSIS — Z7901 Long term (current) use of anticoagulants: Secondary | ICD-10-CM | POA: Diagnosis not present

## 2017-09-08 DIAGNOSIS — E291 Testicular hypofunction: Secondary | ICD-10-CM | POA: Diagnosis not present

## 2017-10-01 ENCOUNTER — Other Ambulatory Visit: Payer: Self-pay | Admitting: Cardiology

## 2017-10-06 DIAGNOSIS — Z85828 Personal history of other malignant neoplasm of skin: Secondary | ICD-10-CM | POA: Diagnosis not present

## 2017-10-06 DIAGNOSIS — L905 Scar conditions and fibrosis of skin: Secondary | ICD-10-CM | POA: Diagnosis not present

## 2017-10-13 ENCOUNTER — Encounter: Payer: 59 | Attending: Internal Medicine | Admitting: Dietician

## 2017-10-13 ENCOUNTER — Encounter: Payer: Self-pay | Admitting: Dietician

## 2017-10-13 DIAGNOSIS — E669 Obesity, unspecified: Secondary | ICD-10-CM | POA: Diagnosis not present

## 2017-10-13 DIAGNOSIS — Z713 Dietary counseling and surveillance: Secondary | ICD-10-CM | POA: Diagnosis not present

## 2017-10-13 DIAGNOSIS — N183 Chronic kidney disease, stage 3 unspecified: Secondary | ICD-10-CM

## 2017-10-13 DIAGNOSIS — E119 Type 2 diabetes mellitus without complications: Secondary | ICD-10-CM | POA: Insufficient documentation

## 2017-10-13 DIAGNOSIS — Z7901 Long term (current) use of anticoagulants: Secondary | ICD-10-CM | POA: Diagnosis not present

## 2017-10-13 DIAGNOSIS — E291 Testicular hypofunction: Secondary | ICD-10-CM | POA: Diagnosis not present

## 2017-10-13 NOTE — Patient Instructions (Signed)
Aim for 30 minutes of exercise most days.  Start slowly and increase slowly as tolerated. Speak to your MD about your potassium blood level and your potassium supplement. Watch your fat intake  Baked rather than fried  Lean meat, chicken without the skin.  Whole wheat bread or english muffin rather than biscuits  Be a spreader not a glopper. Avoid dark soda.  Avoid anything with phosphorous as an ingredient. Drink beverages without carbohydrate primarily. Be mindful when eating out.  Downsize portions, share, choose low fat. Eat slowly, stop when you are satisfied.  Aim for 3 Carb Choices per meal (45 grams) +/- 1 either way  Aim for 0-1 Carbs per snack if hungry  Include protein in moderation with your meals and snacks Consider reading food labels for Total Carbohydrate and Fat Grams of foods Consider checking BG at alternate times per day as directed by MD  Continue taking medication as directed by MD

## 2017-10-13 NOTE — Progress Notes (Signed)
Diabetes Self-Management Education  Visit Type: First/Initial  Appt. Start Time: 1515 Appt. End Time: 1625  10/13/2017  [name], identified by name and date of birth, is a 72 y.o. male with a diagnosis of Diabetes: Type 2. Other history includes depression, OSA on C-pap, hyperlipidemia, HTN, vitamin D deficiency, history of melanoma, CKD, CABG, clotting disorder. Medications include glyburide, metformin, coumadin, actos, many supplements.  Please see list. Labs noted to include BUN 31, Creatine 1.95, Potassium 5.5 (on potassium), Vitamin D 37.6, urine microalbumin 7.44, MA/CR ratio 45.1 (09/08/17).  Patient lives with his wife.  He does the shopping and cooking as his wife still works.  He is retired from [employer] and currently works part time for [employer]. They just bought a new treadmill and it is to arrive soon.  His diet is currently high in salt, fat, and carbohydrates.  He loves vegetables but does not always incorporate them ("lazy").  He also notes that he is considering [facility].  He states that his wife also needs to make changes but she may not be willing to and was unable to come to this appointment today due to work.   ASSESSMENT  Height 5\' 10"  (1.778 m), weight 240 lb (108.9 kg). Body mass index is 34.44 kg/m.  Diabetes Self-Management Education - 10/13/17 1521      Visit Information   Visit Type  First/Initial      Initial Visit   Diabetes Type  Type 2    Are you currently following a meal plan?  No    Are you taking your medications as prescribed?  Yes    Date Diagnosed  [place]   How would you rate your overall health?  Fair      Psychosocial Assessment   Patient Belief/Attitude about Diabetes  Defeat/Burnout    Self-care barriers  None    Other persons present  Patient    Patient Concerns  Nutrition/Meal planning;Glycemic Control;Weight Control    Special  Needs  None    Preferred Learning Style  No preference indicated    Learning Readiness  Ready    How often do you need to have someone help you when you read instructions, pamphlets, or other written materials from your doctor or pharmacy?  1 - Never    What is the last grade level you completed in school?  4 years college      Pre-Education Assessment   Patient understands the diabetes disease and treatment process.  Demonstrates understanding / competency    Patient understands incorporating nutritional management into lifestyle.  Needs Review    Patient undertands incorporating physical activity into lifestyle.  Needs Review    Patient understands using medications safely.  Needs Review    Patient understands monitoring blood glucose, interpreting and using results  Needs Review    Patient understands prevention, detection, and treatment of acute complications.  Needs Review    Patient understands prevention, detection, and treatment of chronic complications.  Needs Review    Patient understands how to develop strategies to address psychosocial issues.  Needs Review    Patient understands how to develop strategies to promote health/change behavior.  Needs Review      Complications   Last HgB A1C per patient/outside source  7.1 % 09/08/17    How often do you check your blood sugar?  0 times/day (not testing)    Have you had a  dilated eye exam in the past 12 months?  Yes    Have you had a dental exam in the past 12 months?  Yes    Are you checking your feet?  Yes    How many days per week are you checking your feet?  4      Dietary Intake   Breakfast  2 frozen waffles with regular syrup OR egg, sausage on english muffin or biscuit with meat OR tenderloin, grits, eggs, gravy, 1/2 biscuit 7-8    Snack (morning)  none    Lunch  fried chicken, biscuit, potatoes, gravy ([employer]) OR other fast food OR sandwich and diet soda 11:30    Snack (afternoon)  rare    Dinner  baked breaded shrimp  OR chicken, peas or corn or pintos, potatoes, honey carrots, other vegetables OR hot dogs and baked beans, garlic bread 5-8    Snack (evening)  occasional cheese    Beverage(s)  diet soda, water, half and half tea, occasional juice        Exercise   Exercise Type  Light (walking / raking leaves)    How many days per week to you exercise?  2    How many minutes per day do you exercise?  30    Total minutes per week of exercise  60      Patient Education   Previous Diabetes Education  Yes (please comment) years ago and lost weight    Nutrition management   Role of diet in the treatment of diabetes and the relationship between the three main macronutrients and blood glucose level;Food label reading, portion sizes and measuring food.;Meal options for control of blood glucose level and chronic complications.;Information on hints to eating out and maintain blood glucose control.    Physical activity and exercise   Role of exercise on diabetes management, blood pressure control and cardiac health.;Helped patient identify appropriate exercises in relation to his/her diabetes, diabetes complications and other health issue.    Medications  Reviewed patients medication for diabetes, action, purpose, timing of dose and side effects.    Monitoring  Purpose and frequency of SMBG.    Acute complications  Other (comment) his treatment of perceived lows    Chronic complications  Relationship between chronic complications and blood glucose control    Psychosocial adjustment  Worked with patient to identify barriers to care and solutions;Helped patient identify a support system for diabetes management;Identified and addressed patients feelings and concerns about diabetes;Brainstormed with patient on coping mechanisms for social situations, getting support from significant others, dealing with feelings about diabetes    Personal strategies to promote health  Lifestyle issues that need to be addressed for better  diabetes care      Individualized Goals (developed by patient)   Nutrition  General guidelines for healthy choices and portions discussed    Physical Activity  Exercise 5-7 days per week;15 minutes per day    Medications  take my medication as prescribed    Monitoring   test my blood glucose as discussed    Problem Solving  healthier choices when eating out, reducing sodium    Reducing Risk  increase portions of healthy fats;do foot checks daily    Health Coping  discuss diabetes with (comment) MD, RD, CDE      Post-Education Assessment   Patient understands the diabetes disease and treatment process.  Demonstrates understanding / competency    Patient understands incorporating nutritional management into lifestyle.  Demonstrates understanding / competency  Patient undertands incorporating physical activity into lifestyle.  Demonstrates understanding / competency    Patient understands using medications safely.  Demonstrates understanding / competency    Patient understands monitoring blood glucose, interpreting and using results  Demonstrates understanding / competency    Patient understands prevention, detection, and treatment of acute complications.  Demonstrates understanding / competency    Patient understands prevention, detection, and treatment of chronic complications.  Demonstrates understanding / competency    Patient understands how to develop strategies to address psychosocial issues.  Demonstrates understanding / competency    Patient understands how to develop strategies to promote health/change behavior.  Demonstrates understanding / competency      Outcomes   Expected Outcomes  Demonstrated interest in learning. Expect positive outcomes    Future DMSE  PRN    Program Status  Completed       Individualized Plan for Diabetes Self-Management Training:   Learning Objective:  Patient will have a greater understanding of diabetes self-management. Patient education plan is  to attend individual and/or group sessions per assessed needs and concerns. Also discussed the need to reduce sodium intake.   Plan:   Patient Instructions  Aim for 30 minutes of exercise most days.  Start slowly and increase slowly as tolerated. Speak to your MD about your potassium blood level and your potassium supplement. Watch your fat intake  Baked rather than fried  Lean meat, chicken without the skin.  Whole wheat bread or english muffin rather than biscuits  Be a spreader not a glopper. Avoid dark soda.  Avoid anything with phosphorous as an ingredient. Drink beverages without carbohydrate primarily. Be mindful when eating out.  Downsize portions, share, choose low fat. Eat slowly, stop when you are satisfied.  Aim for 3 Carb Choices per meal (45 grams) +/- 1 either way  Aim for 0-1 Carbs per snack if hungry  Include protein in moderation with your meals and snacks Consider reading food labels for Total Carbohydrate and Fat Grams of foods Consider checking BG at alternate times per day as directed by MD  Continue taking medication as directed by MD       Expected Outcomes:  Demonstrated interest in learning. Expect positive outcomes  Education material provided: Food label handouts, Meal plan card, My Plate and Snack sheet  If problems or questions, patient to contact team via:  Phone  Future DSME appointment: PRN

## 2017-10-27 DIAGNOSIS — E291 Testicular hypofunction: Secondary | ICD-10-CM | POA: Diagnosis not present

## 2017-11-10 DIAGNOSIS — Z7901 Long term (current) use of anticoagulants: Secondary | ICD-10-CM | POA: Diagnosis not present

## 2017-11-10 DIAGNOSIS — E291 Testicular hypofunction: Secondary | ICD-10-CM | POA: Diagnosis not present

## 2017-11-24 ENCOUNTER — Ambulatory Visit
Admission: RE | Admit: 2017-11-24 | Discharge: 2017-11-24 | Disposition: A | Discharge status: Home / Self Care | Payer: 59 | Source: Ambulatory Visit | Attending: Internal Medicine | Admitting: Internal Medicine

## 2017-11-24 ENCOUNTER — Other Ambulatory Visit: Payer: Self-pay | Admitting: Internal Medicine

## 2017-11-24 DIAGNOSIS — R0602 Shortness of breath: Secondary | ICD-10-CM | POA: Diagnosis not present

## 2017-11-24 DIAGNOSIS — R059 Cough, unspecified: Secondary | ICD-10-CM

## 2017-11-24 DIAGNOSIS — E291 Testicular hypofunction: Secondary | ICD-10-CM | POA: Diagnosis not present

## 2017-11-24 DIAGNOSIS — J209 Acute bronchitis, unspecified: Secondary | ICD-10-CM | POA: Diagnosis not present

## 2017-11-24 DIAGNOSIS — R509 Fever, unspecified: Secondary | ICD-10-CM | POA: Diagnosis not present

## 2017-11-24 DIAGNOSIS — R05 Cough: Secondary | ICD-10-CM

## 2017-11-24 DIAGNOSIS — E1140 Type 2 diabetes mellitus with diabetic neuropathy, unspecified: Secondary | ICD-10-CM | POA: Diagnosis not present

## 2017-11-24 DIAGNOSIS — R0689 Other abnormalities of breathing: Secondary | ICD-10-CM | POA: Diagnosis not present

## 2017-11-24 DIAGNOSIS — Z7901 Long term (current) use of anticoagulants: Secondary | ICD-10-CM | POA: Diagnosis not present

## 2017-11-27 DIAGNOSIS — R0602 Shortness of breath: Secondary | ICD-10-CM | POA: Diagnosis not present

## 2017-11-27 DIAGNOSIS — R509 Fever, unspecified: Secondary | ICD-10-CM | POA: Diagnosis not present

## 2017-12-01 DIAGNOSIS — Z86711 Personal history of pulmonary embolism: Secondary | ICD-10-CM | POA: Diagnosis not present

## 2017-12-01 DIAGNOSIS — J209 Acute bronchitis, unspecified: Secondary | ICD-10-CM | POA: Diagnosis not present

## 2017-12-01 DIAGNOSIS — R05 Cough: Secondary | ICD-10-CM | POA: Diagnosis not present

## 2017-12-01 DIAGNOSIS — Z7901 Long term (current) use of anticoagulants: Secondary | ICD-10-CM | POA: Diagnosis not present

## 2017-12-08 DIAGNOSIS — E291 Testicular hypofunction: Secondary | ICD-10-CM | POA: Diagnosis not present

## 2017-12-22 DIAGNOSIS — L578 Other skin changes due to chronic exposure to nonionizing radiation: Secondary | ICD-10-CM | POA: Diagnosis not present

## 2017-12-22 DIAGNOSIS — Z85820 Personal history of malignant melanoma of skin: Secondary | ICD-10-CM | POA: Diagnosis not present

## 2017-12-22 DIAGNOSIS — L570 Actinic keratosis: Secondary | ICD-10-CM | POA: Diagnosis not present

## 2017-12-22 DIAGNOSIS — D225 Melanocytic nevi of trunk: Secondary | ICD-10-CM | POA: Diagnosis not present

## 2017-12-22 DIAGNOSIS — Z85828 Personal history of other malignant neoplasm of skin: Secondary | ICD-10-CM | POA: Diagnosis not present

## 2017-12-22 DIAGNOSIS — L821 Other seborrheic keratosis: Secondary | ICD-10-CM | POA: Diagnosis not present

## 2017-12-29 DIAGNOSIS — E291 Testicular hypofunction: Secondary | ICD-10-CM | POA: Diagnosis not present

## 2017-12-29 DIAGNOSIS — Z7901 Long term (current) use of anticoagulants: Secondary | ICD-10-CM | POA: Diagnosis not present

## 2018-01-12 DIAGNOSIS — E291 Testicular hypofunction: Secondary | ICD-10-CM | POA: Diagnosis not present

## 2018-01-26 DIAGNOSIS — E291 Testicular hypofunction: Secondary | ICD-10-CM | POA: Diagnosis not present

## 2018-01-26 DIAGNOSIS — Z7901 Long term (current) use of anticoagulants: Secondary | ICD-10-CM | POA: Diagnosis not present

## 2018-02-02 DIAGNOSIS — E291 Testicular hypofunction: Secondary | ICD-10-CM | POA: Diagnosis not present

## 2018-02-02 DIAGNOSIS — G4733 Obstructive sleep apnea (adult) (pediatric): Secondary | ICD-10-CM | POA: Diagnosis not present

## 2018-02-02 DIAGNOSIS — E1121 Type 2 diabetes mellitus with diabetic nephropathy: Secondary | ICD-10-CM | POA: Diagnosis not present

## 2018-02-02 DIAGNOSIS — E559 Vitamin D deficiency, unspecified: Secondary | ICD-10-CM | POA: Diagnosis not present

## 2018-02-02 DIAGNOSIS — I10 Essential (primary) hypertension: Secondary | ICD-10-CM | POA: Diagnosis not present

## 2018-02-02 DIAGNOSIS — I25118 Atherosclerotic heart disease of native coronary artery with other forms of angina pectoris: Secondary | ICD-10-CM | POA: Diagnosis not present

## 2018-02-02 DIAGNOSIS — E1140 Type 2 diabetes mellitus with diabetic neuropathy, unspecified: Secondary | ICD-10-CM | POA: Diagnosis not present

## 2018-02-02 DIAGNOSIS — L570 Actinic keratosis: Secondary | ICD-10-CM | POA: Diagnosis not present

## 2018-02-02 DIAGNOSIS — Z7901 Long term (current) use of anticoagulants: Secondary | ICD-10-CM | POA: Diagnosis not present

## 2018-02-02 DIAGNOSIS — Z86711 Personal history of pulmonary embolism: Secondary | ICD-10-CM | POA: Diagnosis not present

## 2018-02-02 DIAGNOSIS — E669 Obesity, unspecified: Secondary | ICD-10-CM | POA: Diagnosis not present

## 2018-02-02 DIAGNOSIS — F322 Major depressive disorder, single episode, severe without psychotic features: Secondary | ICD-10-CM | POA: Diagnosis not present

## 2018-02-15 DIAGNOSIS — E119 Type 2 diabetes mellitus without complications: Secondary | ICD-10-CM | POA: Diagnosis not present

## 2018-02-15 DIAGNOSIS — H2513 Age-related nuclear cataract, bilateral: Secondary | ICD-10-CM | POA: Diagnosis not present

## 2018-02-18 ENCOUNTER — Other Ambulatory Visit: Payer: Self-pay | Admitting: Cardiology

## 2018-02-23 DIAGNOSIS — E291 Testicular hypofunction: Secondary | ICD-10-CM | POA: Diagnosis not present

## 2018-02-23 DIAGNOSIS — Z7901 Long term (current) use of anticoagulants: Secondary | ICD-10-CM | POA: Diagnosis not present

## 2018-03-04 ENCOUNTER — Ambulatory Visit (INDEPENDENT_AMBULATORY_CARE_PROVIDER_SITE_OTHER): Payer: 59 | Admitting: Cardiology

## 2018-03-04 ENCOUNTER — Encounter: Payer: Self-pay | Admitting: Cardiology

## 2018-03-04 VITALS — BP 136/78 | HR 76 | Ht 70.0 in | Wt 234.4 lb

## 2018-03-04 DIAGNOSIS — I6523 Occlusion and stenosis of bilateral carotid arteries: Secondary | ICD-10-CM

## 2018-03-04 DIAGNOSIS — I10 Essential (primary) hypertension: Secondary | ICD-10-CM | POA: Diagnosis not present

## 2018-03-04 DIAGNOSIS — I2510 Atherosclerotic heart disease of native coronary artery without angina pectoris: Secondary | ICD-10-CM

## 2018-03-04 DIAGNOSIS — I208 Other forms of angina pectoris: Secondary | ICD-10-CM

## 2018-03-04 NOTE — Patient Instructions (Signed)

## 2018-03-04 NOTE — Progress Notes (Signed)
[place]. [address], [address], [place]  [address] Phone: [phone] Fax:  [phone]  Date:  03/04/2018   ID:  Evan [name], DOB 12-11-1945, MRN [id number]  PCP:  Josetta Huddle, MD   History of Present Illness: Evan [name] is a 72 y.o. male with coronary artery disease status post CABG in 1999 with subsequent cardiac catheterization in 2000 showing occluded diagonal graft occluded RCA graft, patent LIMA to LAD. In 2010 stress test showed no ischemia but he was having angina on the treadmill and we tried Ranexa for a while. At a previous visit in 2017 we stopped Ranexa and he has been doing quite well without it.  He also has chronic anticoagulation with Coumadin because of prior PE bilateral in the setting of melanoma resection from his neck several years ago, 2004. Dr. Inda Merlin has been keeping him on this lifelong.  He has been battling with his weight. It is harder and harder for him to lose weight. He enjoys golfing, still works at the [employer], knows [employer].  He is on multiple medications to help with his lipids and triglycerides.  Dr. Gae Gallop has been watching his carotid arteries.  03/04/18 - one episode CP, NTG. Jan.  No episode since then.  Overall doing quite well.  He still taking Coumadin for his pulmonary emboli history.  Overall doing fairly well.  Seeing Dr. Doren Custard for his carotids.  It is been a while since he played golf.  Enjoys the, right artery of the auto auction.  Occasional cough, occasional snoring, dizziness.  Wt Readings from Last 3 Encounters:  03/04/18 234 lb 6.4 oz (106.3 kg)  10/13/17 240 lb (108.9 kg)  04/22/17 239 lb (108.4 kg)     Past Medical History:  Diagnosis Date  . [facility] any tx. for melanoma.Radiation([facility]) also.  . Cataracts, bilateral   . Chronic renal insufficiency   . Coronary artery disease    '99 -stents, then CABPG-stents failed. Dr. Marlou Porch,  cardiologist.  . Depression   . Diabetes mellitus   . Hypercholesteremia   . Hypertension    [facility] [place]  . Kidney stone   . Pulmonary emboli ([facility])    tx. Coumadin-no problems now  . Sleep apnea    no cpap use at this time(not in over a year).    Past Surgical History:  Procedure Laterality Date  . CARDIAC CATHETERIZATION     x3     last 1999  . CHOLECYSTECTOMY     lap. Cholecystectomy about 5-8 yrs ago  . COLONOSCOPY WITH PROPOFOL N/A 09/26/2014   Procedure: COLONOSCOPY WITH PROPOFOL;  Surgeon: [name], MD;  Location: [facility];  Service: Endoscopy;  Laterality: N/A;  . CORONARY ARTERY BYPASS GRAFT     1999  . MASS EXCISION  04/15/2012   Procedure: EXCISION MASS;  Surgeon: [name], MD;  Location: [facility] OR;  Service: ENT;  Laterality: Left;  Left tonsil biopsy  . melanomia     several skin ca removed-nose, left ankle, parotid gland left, right nodes-being followed annually..    Current Outpatient Medications  Medication Sig Dispense Refill  . amLODipine (NORVASC) 5 MG tablet Take 1 tablet (5 mg total) by mouth daily. 90 tablet 3  . aspirin EC 81 MG tablet Take 81 mg by mouth every evening.    . B Complex-C (SUPER B COMPLEX PO) Take 1 tablet by mouth  daily.    . cholecalciferol (VITAMIN D) 1000 UNITS tablet Take 2,000 Units by mouth every morning.     . citalopram (CELEXA) 40 MG tablet Take 40 mg by mouth every morning.    [name] co-enzyme Q-10 50 MG capsule Take 100 mg by mouth every morning.    . Cranberry 500 MG CAPS Take 500 mg by mouth daily.    . fenofibrate micronized (LOFIBRA) 67 MG capsule TAKE 1 CAPSULE DAILY 90 capsule 1  . FLAXSEED, LINSEED, PO Take 2 tablets by mouth 2 (two) times daily.    . folic acid (FOLVITE) 846 MCG tablet Take 400 mcg by mouth daily.    [name] gabapentin (NEURONTIN) 300 MG capsule Take 1 capsule by mouth daily.    [name] glyBURIDE (DIABETA) 5 MG tablet Take 5 mg by mouth every evening.    . metFORMIN (GLUCOPHAGE) 1000 MG tablet Take 1,000 mg  by mouth 2 (two) times daily with a meal.    . metoprolol succinate (TOPROL-XL) 50 MG 24 hr tablet Take 1 tablet (50 mg total) by mouth daily. Take with or immediately following a meal. 90 tablet 3  . mirabegron ER (MYRBETRIQ) 50 MG TB24 tablet Take 1 tablet by mouth at bedtime.    . Multiple Vitamin (MULITIVITAMIN WITH MINERALS) TABS Take 1 tablet by mouth every evening.    [name] omega-3 acid ethyl esters (LOVAZA) 1 g capsule Take 2 capsules (2 g total) by mouth 2 (two) times daily. Please keep upcoming appt for future refills. Thank you 360 capsule 0  . pantoprazole (PROTONIX) 40 MG tablet Take 40 mg by mouth daily.    . pioglitazone (ACTOS) 30 MG tablet Take 30 mg by mouth every morning.    . potassium gluconate 595 MG TABS tablet Take 595 mg by mouth daily.    . rosuvastatin (CRESTOR) 20 MG tablet Take 20 mg by mouth every evening.    . Tamsulosin HCl (FLOMAX) 0.4 MG CAPS Take 0.4 mg by mouth every evening.    . testosterone cypionate (DEPOTESTOTERONE CYPIONATE) 100 MG/ML injection Inject 150 mg into the muscle every 14 (fourteen) days. For IM use only    . valsartan (DIOVAN) 160 MG tablet Take 80 mg by mouth every morning.    . vardenafil (LEVITRA) 20 MG tablet Take 20 mg by mouth daily as needed. For E.D.    . vitamin C (ASCORBIC ACID) 500 MG tablet Take 1,000 mg by mouth every morning.    . warfarin (COUMADIN) 5 MG tablet Take 5 mg by mouth every evening.      No current facility-administered medications for this visit.     Allergies:    Allergies  Allergen Reactions  . Doxycycline Other (See Comments)    BPH can't void when taking   . Penicillins Rash    Social History:  The patient  reports that he has never smoked. He has never used smokeless tobacco. He reports that he does not drink alcohol or use drugs.   ROS: Unless above all other review of systems negative  PHYSICAL EXAM: VS:  BP 136/78   Pulse 76   Ht 5\' 10"  (1.778 m)   Wt 234 lb 6.4 oz (106.3 kg)   BMI 33.63 kg/m    GEN: Well nourished, well developed, in no acute distress  HEENT: normal  Neck: no JVD, carotid bruits, or masses Cardiac: RRR; no murmurs, rubs, or gallops,no edema  Respiratory:  clear to auscultation bilaterally, normal work of breathing GI: soft, nontender,  nondistended, + BS MS: no deformity or atrophy  Skin: warm and dry, no rash Neuro:  Alert and Oriented x 3, Strength and sensation are intact Psych: euthymic mood, full affect    EKG: EKG ordered today.  03/04/2018-sinus rhythm 76 right bundle branch block leftward axis.  03/02/17-sinus rhythm left axis deviation, left bundle branch block like appearance personally viewed no changes-prior 02/18/16-sinus rhythm, 80, left axis deviation, nonspecific intraventricular conduction of leg, nonspecific ST-T wave changes. Personally viewed-prior 02/16/15-normal sinus rhythm, 87, vertical axis, right bundle branch block like morphology, old septal infarct pattern, old inferior infarct pattern. Prior Normal sinus rhythm, 80, left axis deviation, possible septal infarct pattern, poor R wave progression old lateral infarct pattern. Old inferior infarct pattern.  ECHO 02/27/15 - Left ventricle: The cavity size was mildly dilated. Wall  thickness was normal. Systolic function was normal. The estimated  ejection fraction was in the range of 55% to 60%. Wall motion was  normal; there were no regional wall motion abnormalities.  Labs:  (followed by Dr. Inda Merlin)  ASSESSMENT AND PLAN:    1. Coronary artery disease-status post bypass surgery. Occluded SVG to RCA graft, occluded diagonal. Patent LIMA to LAD. EF is normal 55-60%.  Denies any anginal symptoms, no chest pain, no syncope, no bleeding 2. Angina- well controlled. Medications reviewed.  Doing well without Ranexa.  Continue with metoprolol.  Amlodipine as an antianginal as well. 3. hypertension-Medications reviewed.  No changes made.  Usually 110s. 4. Hyperlipidemia-LDL goal less than 70. He is  also on fish oil, fenofibrate. 5. Carotid artery disease-right 20-39% stenosis, left 60-79% mid left ICA stenosis. The study was on 11/14. He had been seeing [facility]. Nicola Girt at [facility]. Now Dr. Scot Dock with [facility] monitoring his carotid artery disease. Continue with aggressive secondary prevention, statin, aspirin. No changes. 6. Chronic anticoagulation-Dr. Gates-Coumadin-PE, lifelong since 2004.  7. History of melanoma-has had lymph nodes in neck  Resection. This may complicate carotid endarterectomy if this is needed. 8. One-year follow-up  Signed, Candee Furbish, MD [facility]  03/04/2018 3:41 PM

## 2018-03-09 DIAGNOSIS — E291 Testicular hypofunction: Secondary | ICD-10-CM | POA: Diagnosis not present

## 2018-03-16 DIAGNOSIS — G4733 Obstructive sleep apnea (adult) (pediatric): Secondary | ICD-10-CM | POA: Diagnosis not present

## 2018-03-23 DIAGNOSIS — E291 Testicular hypofunction: Secondary | ICD-10-CM | POA: Diagnosis not present

## 2018-03-23 DIAGNOSIS — Z7901 Long term (current) use of anticoagulants: Secondary | ICD-10-CM | POA: Diagnosis not present

## 2018-03-30 ENCOUNTER — Other Ambulatory Visit: Payer: Self-pay | Admitting: Cardiology

## 2018-04-06 DIAGNOSIS — C4372 Malignant melanoma of left lower limb, including hip: Secondary | ICD-10-CM | POA: Diagnosis not present

## 2018-04-06 DIAGNOSIS — Z08 Encounter for follow-up examination after completed treatment for malignant neoplasm: Secondary | ICD-10-CM | POA: Diagnosis not present

## 2018-04-06 DIAGNOSIS — C4331 Malignant melanoma of nose: Secondary | ICD-10-CM | POA: Diagnosis not present

## 2018-04-06 DIAGNOSIS — Z85820 Personal history of malignant melanoma of skin: Secondary | ICD-10-CM | POA: Diagnosis not present

## 2018-04-06 DIAGNOSIS — E291 Testicular hypofunction: Secondary | ICD-10-CM | POA: Diagnosis not present

## 2018-04-20 ENCOUNTER — Other Ambulatory Visit: Payer: Self-pay | Admitting: Cardiology

## 2018-04-20 DIAGNOSIS — Z7901 Long term (current) use of anticoagulants: Secondary | ICD-10-CM | POA: Diagnosis not present

## 2018-04-20 DIAGNOSIS — E291 Testicular hypofunction: Secondary | ICD-10-CM | POA: Diagnosis not present

## 2018-04-27 ENCOUNTER — Other Ambulatory Visit: Payer: Self-pay | Admitting: Cardiology

## 2018-05-04 DIAGNOSIS — E291 Testicular hypofunction: Secondary | ICD-10-CM | POA: Diagnosis not present

## 2018-05-05 ENCOUNTER — Ambulatory Visit: Payer: 59 | Admitting: Vascular Surgery

## 2018-05-05 ENCOUNTER — Encounter (HOSPITAL_COMMUNITY): Payer: 59

## 2018-05-17 ENCOUNTER — Encounter: Payer: Self-pay | Admitting: Vascular Surgery

## 2018-05-17 ENCOUNTER — Ambulatory Visit (INDEPENDENT_AMBULATORY_CARE_PROVIDER_SITE_OTHER): Payer: 59 | Admitting: Vascular Surgery

## 2018-05-17 ENCOUNTER — Ambulatory Visit (HOSPITAL_COMMUNITY)
Admission: RE | Admit: 2018-05-17 | Discharge: 2018-05-17 | Disposition: A | Discharge status: Home / Self Care | Payer: 59 | Source: Ambulatory Visit | Attending: Vascular Surgery | Admitting: Vascular Surgery

## 2018-05-17 ENCOUNTER — Other Ambulatory Visit: Payer: Self-pay

## 2018-05-17 VITALS — BP 130/82 | HR 72 | Resp 20 | Ht 70.0 in | Wt 236.5 lb

## 2018-05-17 DIAGNOSIS — I208 Other forms of angina pectoris: Secondary | ICD-10-CM

## 2018-05-17 DIAGNOSIS — I6523 Occlusion and stenosis of bilateral carotid arteries: Secondary | ICD-10-CM | POA: Diagnosis not present

## 2018-05-17 DIAGNOSIS — I10 Essential (primary) hypertension: Secondary | ICD-10-CM | POA: Insufficient documentation

## 2018-05-17 DIAGNOSIS — I2510 Atherosclerotic heart disease of native coronary artery without angina pectoris: Secondary | ICD-10-CM | POA: Insufficient documentation

## 2018-05-17 NOTE — Progress Notes (Signed)
Patient name: Evan [name] MRN: [id number] DOB: 06-12-1946 Sex: male  REASON FOR VISIT:   Follow-up of bilateral carotid disease  HPI:   Evan [name] is a pleasant 72 y.o. male who I have been following with bilateral carotid disease.  I last saw him on 04/22/2017.  Carotid duplex scan at that time showed a 40 to 59% left carotid stenosis  and a 40 to 59% right carotid stenosis.  He was set up for a follow-up duplex scan in 1 year.  He comes in for that visit.   Since I saw him last, he denies any history of stroke, TIAs, expressive or receptive aphasia, or amaurosis fugax.  He is on aspirin and is on a statin.  There have been no significant changes to his medical history.  He takes Coumadin because he had a pulmonary embolus over 10 years ago after surgery for melanoma.  He does complain of some hip pain with ambulation but is more significant on the right side.  This is something that is fairly new.  It feels better when he stops walking and rest.  Past Medical History:  Diagnosis Date  . Cancer [facility] since any tx. for melanoma.Radiation([facility]) also.  . Cataracts, bilateral   . Chronic renal insufficiency   . Coronary artery disease    '99 -stents, then CABPG-stents failed. Dr. Marlou Porch, cardiologist.  . Depression   . Diabetes mellitus   . Hypercholesteremia   . Hypertension    mark skains  . Kidney stone   . Pulmonary emboli (HCC)    tx. Coumadin-no problems now  . Sleep apnea    no cpap use at this time(not in over a year).    History reviewed. No pertinent family history.  SOCIAL HISTORY: Social History   Tobacco Use  . Smoking status: Never Smoker  . Smokeless tobacco: Never Used  Substance Use Topics  . Alcohol use: No    Allergies  Allergen Reactions  . Doxycycline Other (See Comments)    BPH can't void when taking   . Penicillins Rash    Current Outpatient Medications  Medication Sig Dispense Refill   . amLODipine (NORVASC) 5 MG tablet Take 1 tablet (5 mg total) by mouth daily. 90 tablet 3  . aspirin EC 81 MG tablet Take 81 mg by mouth every evening.    . B Complex-C (SUPER B COMPLEX PO) Take 1 tablet by mouth daily.    . cholecalciferol (VITAMIN D) 1000 UNITS tablet Take 2,000 Units by mouth every morning.     . citalopram (CELEXA) 40 MG tablet Take 40 mg by mouth every morning.    [name] co-enzyme Q-10 50 MG capsule Take 100 mg by mouth every morning.    . Cranberry 500 MG CAPS Take 500 mg by mouth daily.    . fenofibrate micronized (LOFIBRA) 67 MG capsule TAKE 1 CAPSULE DAILY 90 capsule 3  . FLAXSEED, LINSEED, PO Take 2 tablets by mouth 2 (two) times daily.    . folic acid (FOLVITE) 427 MCG tablet Take 400 mcg by mouth daily.    [name] gabapentin (NEURONTIN) 300 MG capsule Take 1 capsule by mouth daily.    [name] glyBURIDE (DIABETA) 5 MG tablet Take 5 mg by mouth every evening.    . metFORMIN (GLUCOPHAGE) 1000 MG tablet Take 1,000 mg by mouth 2 (two) times daily with a meal.    . metoprolol succinate (TOPROL-XL) 50 MG 24 hr tablet  TAKE 1 TABLET DAILY WITH OR IMMEDIATELY FOLLOWING A MEAL 90 tablet 3  . mirabegron ER (MYRBETRIQ) 50 MG TB24 tablet Take 1 tablet by mouth at bedtime.    . Multiple Vitamin (MULITIVITAMIN WITH MINERALS) TABS Take 1 tablet by mouth every evening.    [name] omega-3 acid ethyl esters (LOVAZA) 1 g capsule TAKE 2 CAPSULES TWICE A DAY (PLEASE KEEP UPCOMING APPOINTMENT FOR FUTURE REFILLS) 360 capsule 3  . pantoprazole (PROTONIX) 40 MG tablet Take 40 mg by mouth daily.    . pioglitazone (ACTOS) 30 MG tablet Take 30 mg by mouth every morning.    . potassium gluconate 595 MG TABS tablet Take 595 mg by mouth daily.    . rosuvastatin (CRESTOR) 20 MG tablet Take 20 mg by mouth every evening.    . Tamsulosin HCl (FLOMAX) 0.4 MG CAPS Take 0.4 mg by mouth every evening.    . testosterone cypionate (DEPOTESTOTERONE CYPIONATE) 100 MG/ML injection Inject 150 mg into the muscle every 14 (fourteen)  days. For IM use only    . valsartan (DIOVAN) 160 MG tablet Take 80 mg by mouth every morning.    . vardenafil (LEVITRA) 20 MG tablet Take 20 mg by mouth daily as needed. For E.D.    . vitamin C (ASCORBIC ACID) 500 MG tablet Take 1,000 mg by mouth every morning.    . warfarin (COUMADIN) 5 MG tablet Take 5 mg by mouth every evening. As directed     No current facility-administered medications for this visit.     REVIEW OF SYSTEMS:  [X]  denotes positive finding, [ ]  denotes negative finding Cardiac  Comments:  Chest pain or chest pressure:    Shortness of breath upon exertion:    Short of breath when lying flat:    Irregular heart rhythm:        Vascular    Pain in calf, thigh, or hip brought on by ambulation:    Pain in feet at night that wakes you up from your sleep:     Blood clot in your veins:    Leg swelling:         Pulmonary    Oxygen at home:    Productive cough:     Wheezing:         Neurologic    Sudden weakness in arms or legs:     Sudden numbness in arms or legs:     Sudden onset of difficulty speaking or slurred speech:    Temporary loss of vision in one eye:     Problems with dizziness:         Gastrointestinal    Blood in stool:     Vomited blood:         Genitourinary    Burning when urinating:     Blood in urine:        Psychiatric    Major depression:         Hematologic    Bleeding problems:    Problems with blood clotting too easily:        Skin    Rashes or ulcers:        Constitutional    Fever or chills:     PHYSICAL EXAM:   Vitals:   05/17/18 1006 05/17/18 1008  BP: 133/80 130/82  Pulse: 72   Resp: 20   SpO2: 94%   Weight: 236 lb 8 oz (107.3 kg)   Height: 5\' 10"  (1.778 m)     GENERAL: The  patient is a well-nourished male, in no acute distress. The vital signs are documented above. CARDIAC: There is a regular rate and rhythm.  VASCULAR: I do not detect carotid bruits. He has palpable femoral, dorsalis pedis, and posterior  tibial pulses bilaterally. He has some mild hyperpigmentation consistent with chronic venous insufficiency. PULMONARY: There is good air exchange bilaterally without wheezing or rales. ABDOMEN: Soft and non-tender with normal pitched bowel sounds.  MUSCULOSKELETAL: There are no major deformities or cyanosis. NEUROLOGIC: No focal weakness or paresthesias are detected. SKIN: There are no ulcers or rashes noted. PSYCHIATRIC: The patient has a normal affect.  DATA:    CAROTID DUPLEX: I have independently interpreted his carotid duplex scan today.    On the right side, previously had a 40 to 59% right carotid stenosis and these velocities have improved.  He is now in the less than 39% category.  On the left side he has a 40 to 59% carotid stenosis.  This is stable compared to 1 year ago.  Both vertebral arteries are patent with antegrade flow.  MEDICAL ISSUES:   BILATERAL CAROTID DISEASE: This patient is asymptomatic and has a stable 40 to 59% left carotid stenosis.  He has a less than 39% carotid stenosis.  This had been in a higher category previously but has improved some.  He remains asymptomatic.  I have ordered a follow-up duplex scan in 1 year and we will have him see the nurse practitioner at that time.  He is on aspirin and is on a statin.  He knows to call sooner if he has problems.  BILATERAL HIP PAIN: Patient has had some hip pain with ambulation.  This is in both hips but more significantly on the right side.  He has palpable femoral and pedal pulses.  This reason I suspect that his hip pain is more likely related to arthritis and not peripheral vascular disease.  He is not a smoker.  He is on aspirin and is on a statin.  Deitra [facility] [phone]

## 2018-05-18 DIAGNOSIS — Z7901 Long term (current) use of anticoagulants: Secondary | ICD-10-CM | POA: Diagnosis not present

## 2018-05-18 DIAGNOSIS — E291 Testicular hypofunction: Secondary | ICD-10-CM | POA: Diagnosis not present

## 2018-05-19 ENCOUNTER — Ambulatory Visit: Payer: 59 | Admitting: Vascular Surgery

## 2018-05-19 ENCOUNTER — Encounter (HOSPITAL_COMMUNITY): Payer: 59

## 2018-06-15 DIAGNOSIS — Z7901 Long term (current) use of anticoagulants: Secondary | ICD-10-CM | POA: Diagnosis not present

## 2018-06-22 DIAGNOSIS — L578 Other skin changes due to chronic exposure to nonionizing radiation: Secondary | ICD-10-CM | POA: Diagnosis not present

## 2018-06-22 DIAGNOSIS — L570 Actinic keratosis: Secondary | ICD-10-CM | POA: Diagnosis not present

## 2018-06-22 DIAGNOSIS — D485 Neoplasm of uncertain behavior of skin: Secondary | ICD-10-CM | POA: Diagnosis not present

## 2018-06-22 DIAGNOSIS — L218 Other seborrheic dermatitis: Secondary | ICD-10-CM | POA: Diagnosis not present

## 2018-06-22 DIAGNOSIS — D1801 Hemangioma of skin and subcutaneous tissue: Secondary | ICD-10-CM | POA: Diagnosis not present

## 2018-06-22 DIAGNOSIS — Z85820 Personal history of malignant melanoma of skin: Secondary | ICD-10-CM | POA: Diagnosis not present

## 2018-06-22 DIAGNOSIS — Z85828 Personal history of other malignant neoplasm of skin: Secondary | ICD-10-CM | POA: Diagnosis not present

## 2018-06-22 DIAGNOSIS — D225 Melanocytic nevi of trunk: Secondary | ICD-10-CM | POA: Diagnosis not present

## 2018-06-22 DIAGNOSIS — L821 Other seborrheic keratosis: Secondary | ICD-10-CM | POA: Diagnosis not present

## 2018-07-13 DIAGNOSIS — Z7901 Long term (current) use of anticoagulants: Secondary | ICD-10-CM | POA: Diagnosis not present

## 2018-07-27 DIAGNOSIS — Z7901 Long term (current) use of anticoagulants: Secondary | ICD-10-CM | POA: Diagnosis not present

## 2018-07-27 DIAGNOSIS — E291 Testicular hypofunction: Secondary | ICD-10-CM | POA: Diagnosis not present

## 2018-08-10 DIAGNOSIS — E291 Testicular hypofunction: Secondary | ICD-10-CM | POA: Diagnosis not present

## 2018-08-11 IMAGING — DX DG CHEST 2V
2 series · 2 of 2 positions shown · non-contrast
Comparison: PA and lateral chest x-ray April 13, 2012

CLINICAL DATA: Productive cough, shortness of breath, wheezing, and
fever for the past 5 days. History of hypertension, previous CABG,
nonsmoker., and diabetes.

EXAM:
CHEST  2 VIEW

[dg chest 2 view (1 of 2)]
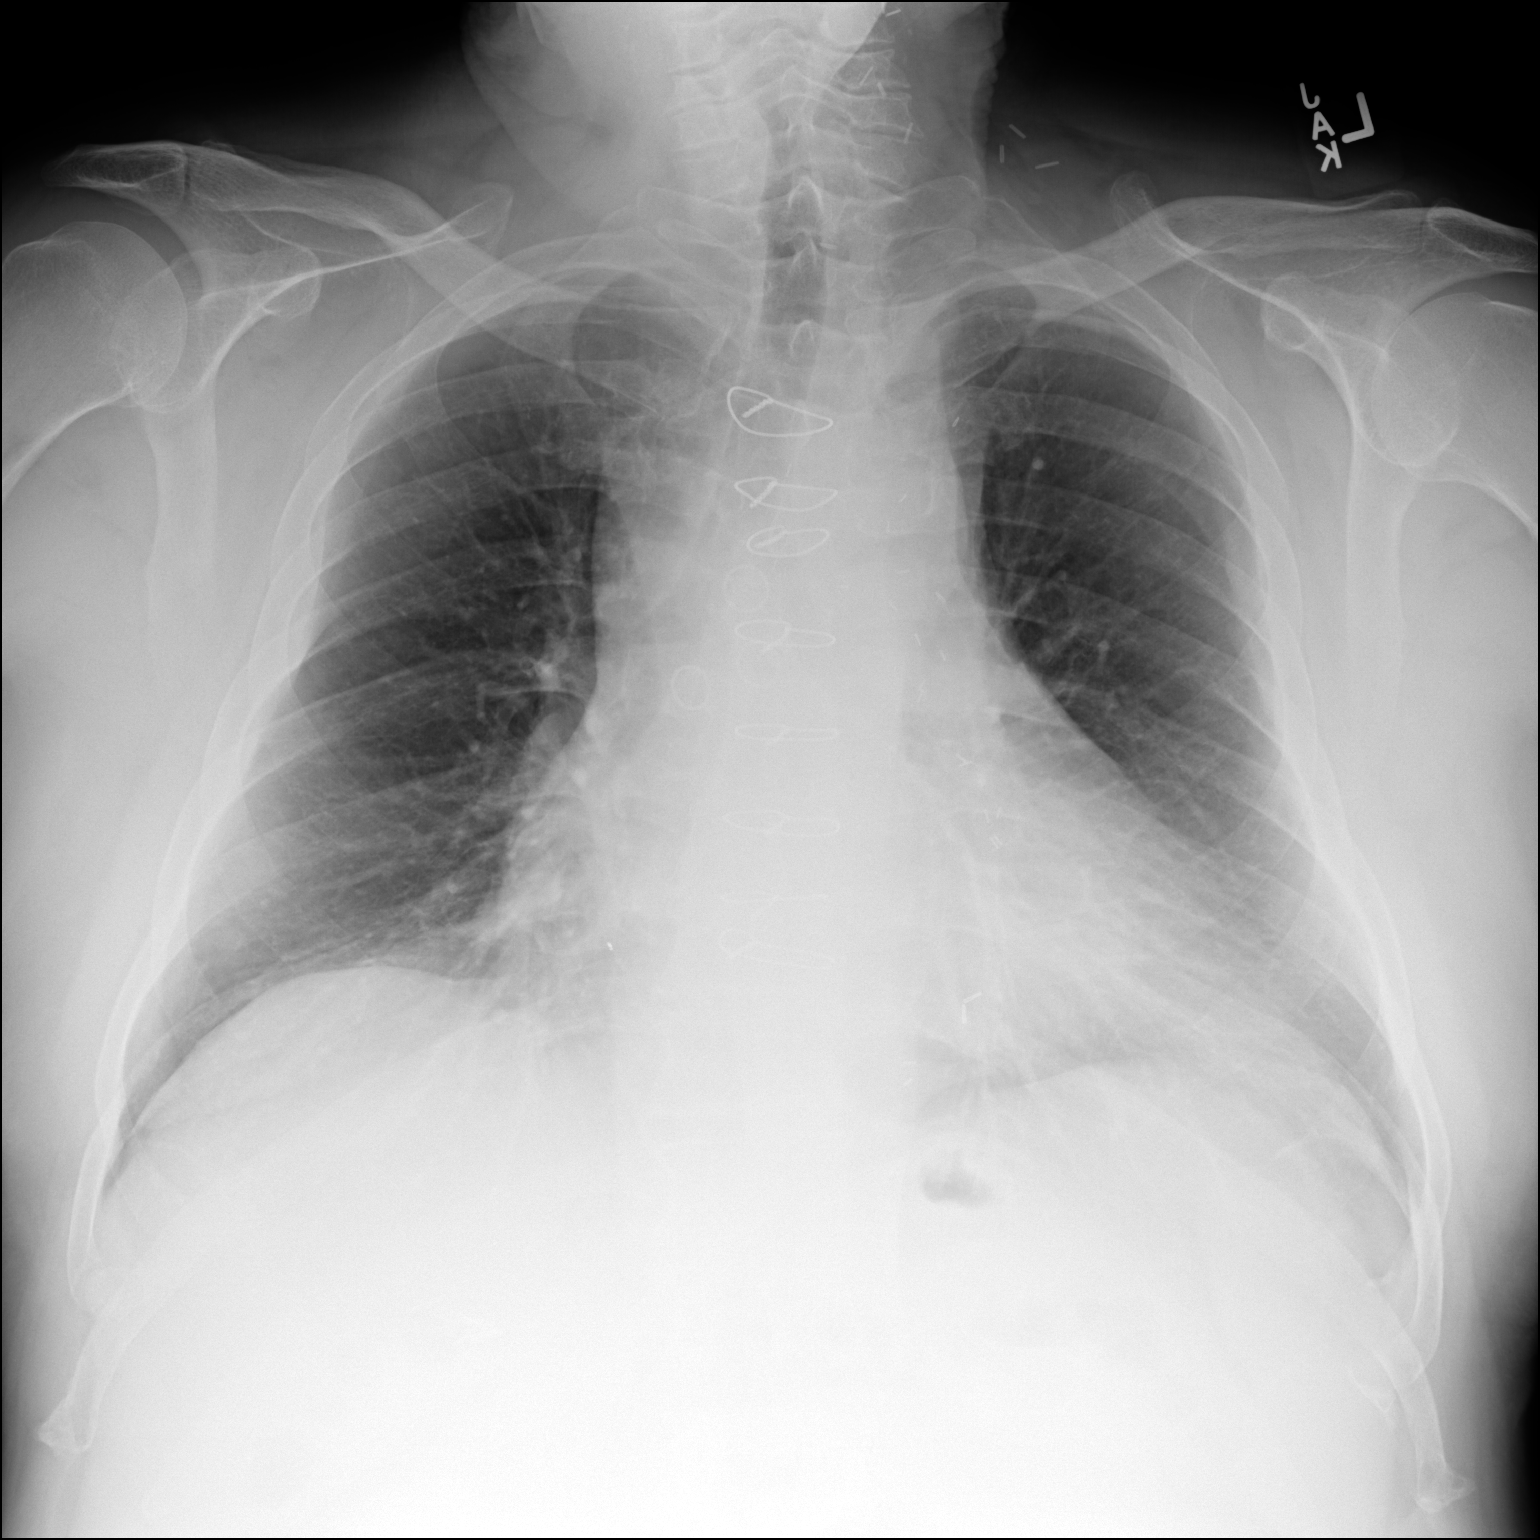

[dg chest 2 view (2 of 2)]
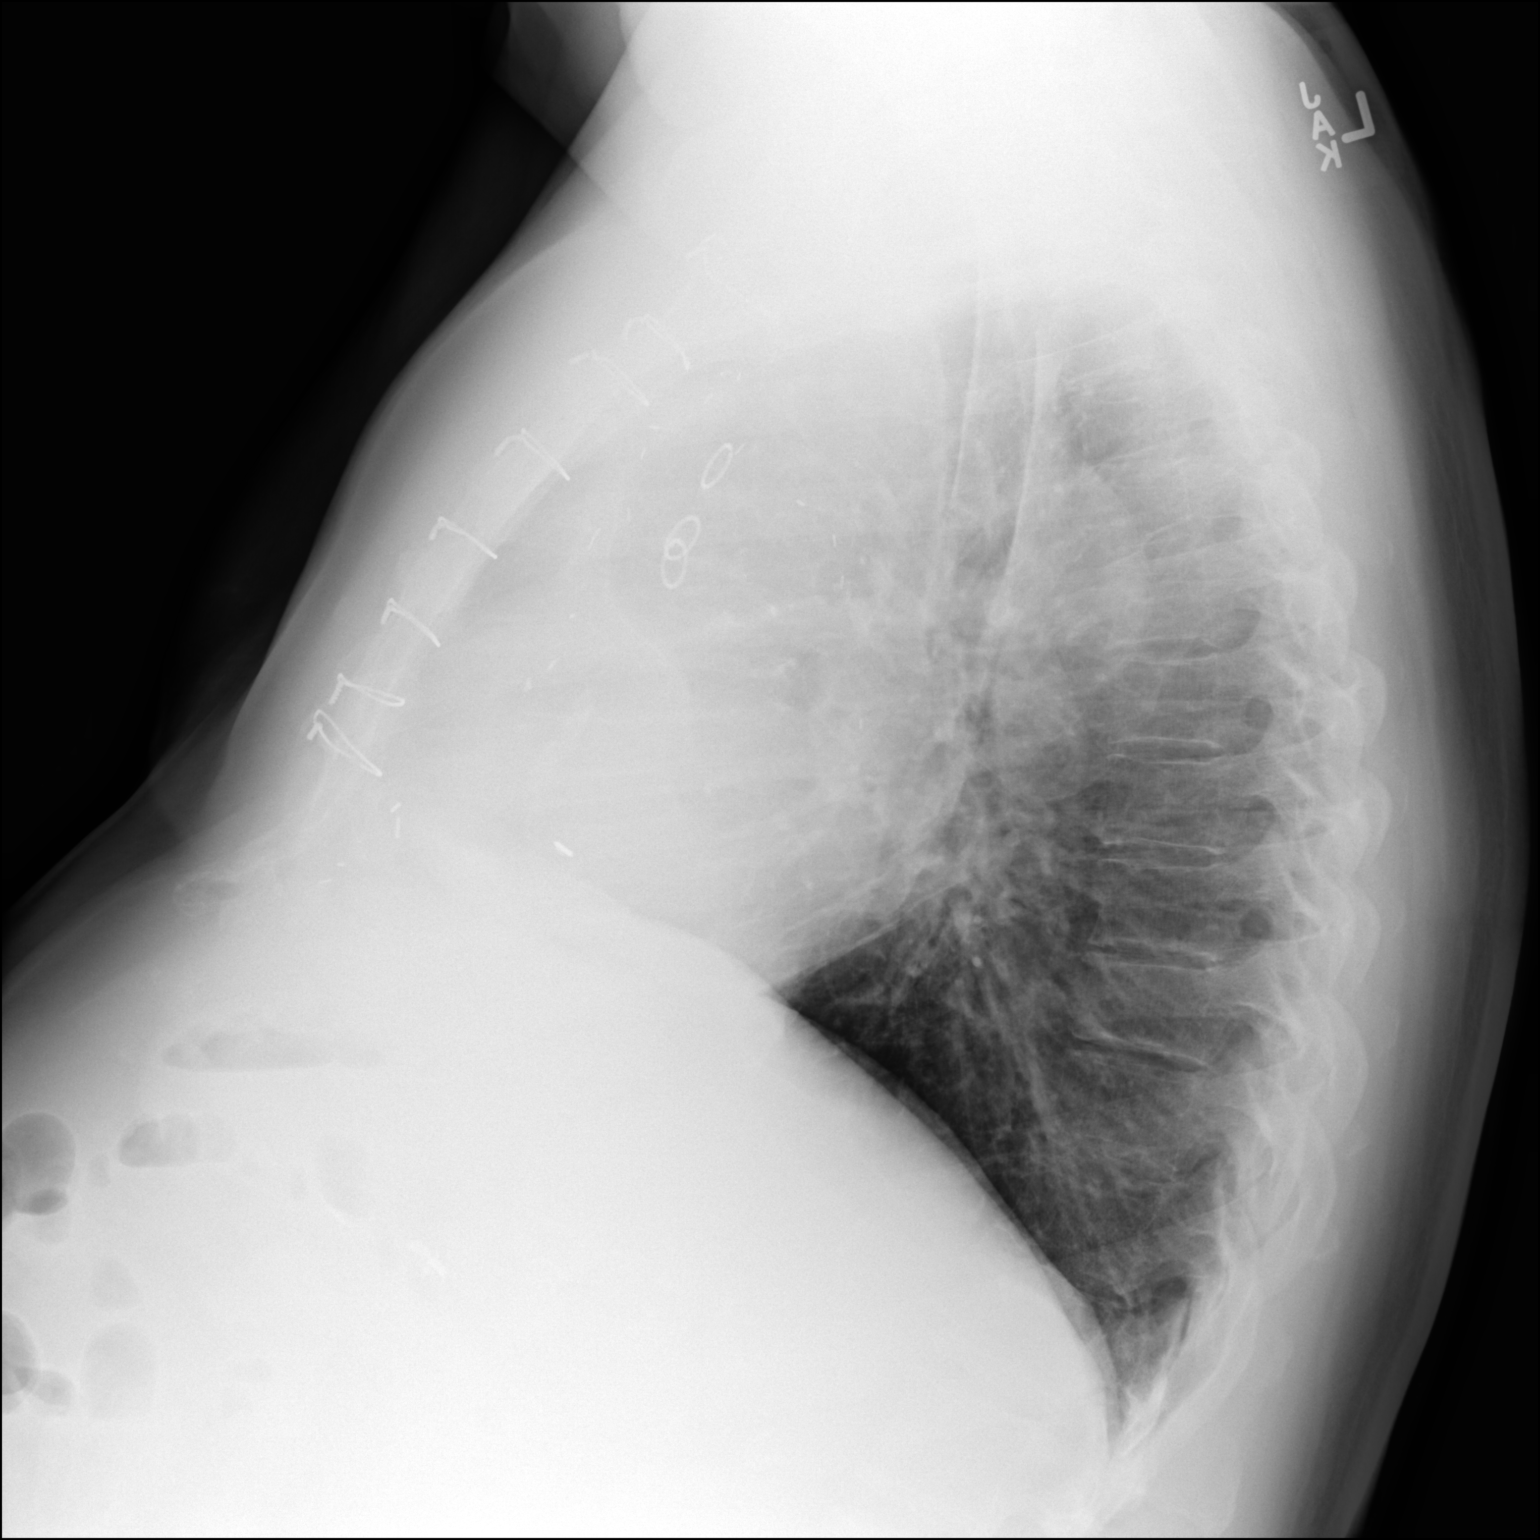

[2 of 2 positions shown; findings below may reference images not displayed]

FINDINGS: The lungs are adequately inflated. There is no focal infiltrate.
There is no pleural effusion. The cardiac silhouette is enlarged.
The pulmonary vascularity is normal. There is chronic widening of
the mediastinum. The sternal wires are intact. The retrosternal soft
tissues are normal. The bony thorax is unremarkable.
IMPRESSION: No acute pneumonia. Cardiomegaly without pulmonary vascular
congestion or pulmonary edema. Previous CABG.

## 2018-09-07 DIAGNOSIS — E291 Testicular hypofunction: Secondary | ICD-10-CM | POA: Diagnosis not present

## 2018-09-13 DIAGNOSIS — Z0000 Encounter for general adult medical examination without abnormal findings: Secondary | ICD-10-CM | POA: Diagnosis not present

## 2018-09-13 DIAGNOSIS — E1142 Type 2 diabetes mellitus with diabetic polyneuropathy: Secondary | ICD-10-CM | POA: Diagnosis not present

## 2018-09-13 DIAGNOSIS — I25118 Atherosclerotic heart disease of native coronary artery with other forms of angina pectoris: Secondary | ICD-10-CM | POA: Diagnosis not present

## 2018-09-13 DIAGNOSIS — Z136 Encounter for screening for cardiovascular disorders: Secondary | ICD-10-CM | POA: Diagnosis not present

## 2018-09-13 DIAGNOSIS — E291 Testicular hypofunction: Secondary | ICD-10-CM | POA: Diagnosis not present

## 2018-09-13 DIAGNOSIS — R351 Nocturia: Secondary | ICD-10-CM | POA: Diagnosis not present

## 2018-09-13 DIAGNOSIS — E7800 Pure hypercholesterolemia, unspecified: Secondary | ICD-10-CM | POA: Diagnosis not present

## 2018-09-13 DIAGNOSIS — Z79899 Other long term (current) drug therapy: Secondary | ICD-10-CM | POA: Diagnosis not present

## 2018-09-13 DIAGNOSIS — N529 Male erectile dysfunction, unspecified: Secondary | ICD-10-CM | POA: Diagnosis not present

## 2018-09-13 DIAGNOSIS — E1121 Type 2 diabetes mellitus with diabetic nephropathy: Secondary | ICD-10-CM | POA: Diagnosis not present

## 2018-09-13 DIAGNOSIS — E559 Vitamin D deficiency, unspecified: Secondary | ICD-10-CM | POA: Diagnosis not present

## 2018-09-13 DIAGNOSIS — Z125 Encounter for screening for malignant neoplasm of prostate: Secondary | ICD-10-CM | POA: Diagnosis not present

## 2018-09-21 DIAGNOSIS — E291 Testicular hypofunction: Secondary | ICD-10-CM | POA: Diagnosis not present

## 2018-09-21 DIAGNOSIS — Z7901 Long term (current) use of anticoagulants: Secondary | ICD-10-CM | POA: Diagnosis not present

## 2018-10-05 DIAGNOSIS — E291 Testicular hypofunction: Secondary | ICD-10-CM | POA: Diagnosis not present

## 2018-10-06 DIAGNOSIS — R351 Nocturia: Secondary | ICD-10-CM | POA: Diagnosis not present

## 2018-10-06 DIAGNOSIS — N401 Enlarged prostate with lower urinary tract symptoms: Secondary | ICD-10-CM | POA: Diagnosis not present

## 2018-10-19 DIAGNOSIS — E291 Testicular hypofunction: Secondary | ICD-10-CM | POA: Diagnosis not present

## 2018-10-19 DIAGNOSIS — Z7901 Long term (current) use of anticoagulants: Secondary | ICD-10-CM | POA: Diagnosis not present

## 2018-11-16 DIAGNOSIS — E291 Testicular hypofunction: Secondary | ICD-10-CM | POA: Diagnosis not present

## 2018-11-16 DIAGNOSIS — Z7901 Long term (current) use of anticoagulants: Secondary | ICD-10-CM | POA: Diagnosis not present

## 2018-11-30 DIAGNOSIS — E291 Testicular hypofunction: Secondary | ICD-10-CM | POA: Diagnosis not present

## 2018-12-14 DIAGNOSIS — E291 Testicular hypofunction: Secondary | ICD-10-CM | POA: Diagnosis not present

## 2018-12-14 DIAGNOSIS — Z7901 Long term (current) use of anticoagulants: Secondary | ICD-10-CM | POA: Diagnosis not present

## 2018-12-28 DIAGNOSIS — E291 Testicular hypofunction: Secondary | ICD-10-CM | POA: Diagnosis not present

## 2019-01-11 DIAGNOSIS — Z7901 Long term (current) use of anticoagulants: Secondary | ICD-10-CM | POA: Diagnosis not present

## 2019-01-11 DIAGNOSIS — E291 Testicular hypofunction: Secondary | ICD-10-CM | POA: Diagnosis not present

## 2019-01-12 DIAGNOSIS — N401 Enlarged prostate with lower urinary tract symptoms: Secondary | ICD-10-CM | POA: Diagnosis not present

## 2019-01-12 DIAGNOSIS — R351 Nocturia: Secondary | ICD-10-CM | POA: Diagnosis not present

## 2019-01-12 DIAGNOSIS — N5201 Erectile dysfunction due to arterial insufficiency: Secondary | ICD-10-CM | POA: Diagnosis not present

## 2019-01-25 DIAGNOSIS — E291 Testicular hypofunction: Secondary | ICD-10-CM | POA: Diagnosis not present

## 2019-01-30 ENCOUNTER — Emergency Department (HOSPITAL_BASED_OUTPATIENT_CLINIC_OR_DEPARTMENT_OTHER): Payer: 59

## 2019-01-30 ENCOUNTER — Emergency Department (HOSPITAL_BASED_OUTPATIENT_CLINIC_OR_DEPARTMENT_OTHER)
Admission: EM | Admit: 2019-01-30 | Discharge: 2019-01-30 | Disposition: A | Discharge status: Home / Self Care | Payer: 59 | Attending: Emergency Medicine | Admitting: Emergency Medicine

## 2019-01-30 ENCOUNTER — Encounter (HOSPITAL_BASED_OUTPATIENT_CLINIC_OR_DEPARTMENT_OTHER): Payer: Self-pay | Admitting: Emergency Medicine

## 2019-01-30 ENCOUNTER — Other Ambulatory Visit: Payer: Self-pay

## 2019-01-30 DIAGNOSIS — Z7984 Long term (current) use of oral hypoglycemic drugs: Secondary | ICD-10-CM | POA: Insufficient documentation

## 2019-01-30 DIAGNOSIS — M7989 Other specified soft tissue disorders: Secondary | ICD-10-CM | POA: Diagnosis not present

## 2019-01-30 DIAGNOSIS — L539 Erythematous condition, unspecified: Secondary | ICD-10-CM | POA: Diagnosis not present

## 2019-01-30 DIAGNOSIS — R7989 Other specified abnormal findings of blood chemistry: Secondary | ICD-10-CM | POA: Diagnosis not present

## 2019-01-30 DIAGNOSIS — I10 Essential (primary) hypertension: Secondary | ICD-10-CM | POA: Insufficient documentation

## 2019-01-30 DIAGNOSIS — R238 Other skin changes: Secondary | ICD-10-CM | POA: Insufficient documentation

## 2019-01-30 DIAGNOSIS — Z7982 Long term (current) use of aspirin: Secondary | ICD-10-CM | POA: Diagnosis not present

## 2019-01-30 DIAGNOSIS — E119 Type 2 diabetes mellitus without complications: Secondary | ICD-10-CM | POA: Insufficient documentation

## 2019-01-30 DIAGNOSIS — Z79899 Other long term (current) drug therapy: Secondary | ICD-10-CM | POA: Insufficient documentation

## 2019-01-30 DIAGNOSIS — Z951 Presence of aortocoronary bypass graft: Secondary | ICD-10-CM | POA: Insufficient documentation

## 2019-01-30 DIAGNOSIS — R2241 Localized swelling, mass and lump, right lower limb: Secondary | ICD-10-CM | POA: Diagnosis not present

## 2019-01-30 DIAGNOSIS — I2510 Atherosclerotic heart disease of native coronary artery without angina pectoris: Secondary | ICD-10-CM | POA: Insufficient documentation

## 2019-01-30 DIAGNOSIS — M79661 Pain in right lower leg: Secondary | ICD-10-CM | POA: Diagnosis present

## 2019-01-30 MED ORDER — CLINDAMYCIN HCL 150 MG PO CAPS: 450.0000 mg | ORAL_CAPSULE | Freq: Three times a day (TID) | ORAL | 0 refills | Status: AC

## 2019-01-30 NOTE — ED Notes (Signed)
Pt in US

## 2019-01-30 NOTE — ED Triage Notes (Signed)
Pain and redness to L thigh. Sent from [facility].

## 2019-01-30 NOTE — ED Provider Notes (Addendum)
[facility] EMERGENCY DEPARTMENT Provider Note   CSN: [id number] Arrival date & time: 01/30/19  1147    History   Chief Complaint Chief Complaint  Patient presents with   Leg Pain    HPI Evan [name] is a 73 y.o. male with h/o melanoma of LLE, nose, parotid gland on surveillance, LLE DVT and bilateral PE on coumadin, CAD s/p CABG, HTN, HLD, CAS is here for evaluation of left leg pain.  Onset 3 to 4 days ago.  Seen at urgent care and advised to come to the ED for a vascular ultrasound to rule out a blood clot.  Pain is described as "tightness" and soreness with touch in the left calf and anterior lower leg, thigh.  There is associated redness and warmth in the lower leg and a red and mildly tender non pruritic rash to the upper thigh.  Has some pain with walking and palpation in the leg.  No interventions for this.  Had a scrape on his left tibia 1 week ago now with a scab that is healing but slightly tender.  He has history of diabetic neuropathy with chronic numbness and tingling in his feet that has been unchanged.  Has been compliant with his warfarin and aspirin.  He has a left leg surgical scar from when he had melanoma excised in 2006, the left leg is usually slightly swollen compared to the right but in the last 3 days it has gotten a little bit worse.  He frequently does yard work and mows his lawn, last 1 week ago but no exposure to poison ivy.   He denies fever, chills, shortness of breath, chest pain.  No recent surgeries or hospitalizations, prolonged travel.  No estrogen or testosterone use.    HPI  Past Medical History:  Diagnosis Date   Cancer [facility])    [facility]- 10 yrs since any tx. for melanoma.Radiation([facility]) also.   Cataracts, bilateral    Chronic renal insufficiency    Coronary artery disease    '99 -stents, then CABPG-stents failed. Dr. Marlou Porch, cardiologist.   Depression    Diabetes mellitus    Hypercholesteremia     Hypertension    mark skains   Kidney stone    Pulmonary emboli ([place])    tx. Coumadin-no problems now   Sleep apnea    no cpap use at this time(not in over a year).    Patient Active Problem List   Diagnosis Date Noted   Coronary atherosclerosis of native coronary artery 12/23/2013   Pure hypercholesterolemia 12/23/2013   Carotid artery disease ([place]) 12/23/2013   Essential hypertension, benign 12/23/2013   Angina decubitus ([place]) 12/23/2013   Coronary artery disease    Cancer ([place])    Depression    Pulmonary emboli (HCC)    Hypercholesteremia    Kidney stone    Hypertension    Chronic renal insufficiency     Past Surgical History:  Procedure Laterality Date   CARDIAC CATHETERIZATION     x3     last 1999   CHOLECYSTECTOMY     lap. Cholecystectomy about 5-8 yrs ago   COLONOSCOPY WITH PROPOFOL N/A 09/26/2014   Procedure: COLONOSCOPY WITH PROPOFOL;  Surgeon: [name], MD;  Location: [facility];  Service: Endoscopy;  Laterality: N/A;   CORONARY ARTERY BYPASS GRAFT     1999   MASS EXCISION  04/15/2012   Procedure: EXCISION MASS;  Surgeon: [name], MD;  Location: [facility];  Service: ENT;  Laterality: Left;  Left tonsil biopsy   melanomia     several skin ca removed-nose, left ankle, parotid gland left, right nodes-being followed annually..        Home Medications    Prior to Admission medications   Medication Sig Start Date End Date Taking? Authorizing Provider  amLODipine (NORVASC) 5 MG tablet Take 1 tablet (5 mg total) by mouth daily. 04/21/18   Jerline Pain, MD  aspirin EC 81 MG tablet Take 81 mg by mouth every evening.    [provider]  B Complex-C (SUPER B COMPLEX PO) Take 1 tablet by mouth daily.    [provider]  cholecalciferol (VITAMIN D) 1000 UNITS tablet Take 2,000 Units by mouth every morning.     [provider]  citalopram (CELEXA) 40 MG tablet Take 40 mg by mouth every morning.    [provider]  clindamycin (CLEOCIN) 150 MG capsule Take 3 capsules (450 mg total) by mouth 3 (three) times daily for 7 days. 01/30/19 02/06/19  Kinnie Feil, PA-C  co-enzyme Q-10 50 MG capsule Take 100 mg by mouth every morning.    [provider]  Cranberry 500 MG CAPS Take 500 mg by mouth daily.    [provider]  fenofibrate micronized (LOFIBRA) 67 MG capsule TAKE 1 CAPSULE DAILY 03/31/18   Jerline Pain, MD  FLAXSEED, LINSEED, PO Take 2 tablets by mouth 2 (two) times daily.    [provider]  folic acid (FOLVITE) 259 MCG tablet Take 400 mcg by mouth daily.    [provider]  gabapentin (NEURONTIN) 300 MG capsule Take 1 capsule by mouth daily. 01/27/18   [provider]  glyBURIDE (DIABETA) 5 MG tablet Take 5 mg by mouth every evening.    [provider]  metFORMIN (GLUCOPHAGE) 1000 MG tablet Take 1,000 mg by mouth 2 (two) times daily with a meal.    [provider]  metoprolol succinate (TOPROL-XL) 50 MG 24 hr tablet TAKE 1 TABLET DAILY WITH OR IMMEDIATELY FOLLOWING A MEAL 03/31/18   Jerline Pain, MD  mirabegron ER (MYRBETRIQ) 50 MG TB24 tablet Take 1 tablet by mouth at bedtime. 03/19/16   [provider]  Multiple Vitamin (MULITIVITAMIN WITH MINERALS) TABS Take 1 tablet by mouth every evening.    [provider]  omega-3 acid ethyl esters (LOVAZA) 1 g capsule TAKE 2 CAPSULES TWICE A DAY (PLEASE KEEP UPCOMING APPOINTMENT FOR FUTURE REFILLS) 04/28/18   Jerline Pain, MD  pantoprazole (PROTONIX) 40 MG tablet Take 40 mg by mouth daily.    [provider]  pioglitazone (ACTOS) 30 MG tablet Take 30 mg by mouth every morning.    [provider]  potassium gluconate 595 MG TABS tablet Take 595 mg by mouth daily.    [provider]  rosuvastatin (CRESTOR) 20 MG tablet Take 20 mg by mouth every evening.    [provider]  Tamsulosin HCl (FLOMAX) 0.4 MG CAPS Take 0.4 mg by mouth every  evening.    [provider]  testosterone cypionate (DEPOTESTOTERONE CYPIONATE) 100 MG/ML injection Inject 150 mg into the muscle every 14 (fourteen) days. For IM use only    [provider]  valsartan (DIOVAN) 160 MG tablet Take 80 mg by mouth every morning.    [provider]  vardenafil (LEVITRA) 20 MG tablet Take 20 mg by mouth daily as needed. For E.D.    [provider]  vitamin C (ASCORBIC  ACID) 500 MG tablet Take 1,000 mg by mouth every morning.    [provider]  warfarin (COUMADIN) 5 MG tablet Take 5 mg by mouth every evening. As directed    [provider]    Family History No family history on file.  Social History Social History   Tobacco Use   Smoking status: Never Smoker   Smokeless tobacco: Never Used  Substance Use Topics   Alcohol use: No   Drug use: No     Allergies   Doxycycline and Penicillins   Review of Systems Review of Systems  Cardiovascular: Positive for leg swelling.  Skin: Positive for color change and rash.  All other systems reviewed and are negative.    Physical Exam Updated Vital Signs BP (!) 147/78 (BP Location: Right Arm)    Pulse 75    Temp 98.5 F (36.9 C) (Oral)    Resp 20    Ht 5\' 10"  (1.778 m)    Wt 99.8 kg    SpO2 98%    BMI 31.57 kg/m   Physical Exam Vitals signs and nursing note reviewed.  Constitutional:      General: He is not in acute distress.    Appearance: He is well-developed.     Comments: NAD.  HENT:     Head: Normocephalic and atraumatic.     Right Ear: External ear normal.     Left Ear: External ear normal.     Nose: Nose normal.  Eyes:     General: No scleral icterus.    Conjunctiva/sclera: Conjunctivae normal.  Neck:     Musculoskeletal: Normal range of motion and neck supple.  Cardiovascular:     Rate and Rhythm: Normal rate and regular rhythm.     Heart sounds: Normal heart sounds. No murmur.     Comments: 1+ DP and femoral pulses  bilaterally.  Toes are warm. Mild asymmetric edema to LLE with 1+ pitting edema on the left leg up to the knee.  Very mild distal left calf tenderness.  Mild tenderness to the left rash on the left anterior thigh.  No popliteal or posterior thigh, inguinal tenderness. Pulmonary:     Effort: Pulmonary effort is normal.     Breath sounds: Normal breath sounds. No wheezing.  Musculoskeletal: Normal range of motion.        General: No deformity.  Skin:    General: Skin is warm and dry.     Capillary Refill: Capillary refill takes less than 2 seconds.     Findings: Erythema and rash present.     Comments: Tender, splotchy, beefy red macular rash to the left anterior thigh, blanchable.  Mild, diffuse erythema, warmth and swelling to the left lower leg.  Neurological:     Mental Status: He is alert and oriented to person, place, and time.  Psychiatric:        Behavior: Behavior normal.        Thought Content: Thought content normal.        Judgment: Judgment normal.          ED Treatments / Results  Labs (all labs ordered are listed, but only abnormal results are displayed) Labs Reviewed - No data to display  EKG None  Radiology US Venous Img Lower  Left (dvt Study)  Result Date: 01/30/2019 CLINICAL DATA:  Left lower extremity swelling and erythema. Recent fall. History of left lower extremity DVT and PE, on anticoagulation. EXAM: LEFT LOWER EXTREMITY VENOUS DOPPLER ULTRASOUND TECHNIQUE: Gray-scale  sonography with graded compression, as well as color Doppler and duplex ultrasound were performed to evaluate the lower extremity deep venous systems from the level of the common femoral vein and including the common femoral, femoral, profunda femoral, popliteal and calf veins including the posterior tibial, peroneal and gastrocnemius veins when visible. The superficial great saphenous vein was also interrogated. Spectral Doppler was utilized to evaluate flow at rest and with distal  augmentation maneuvers in the common femoral, femoral and popliteal veins. COMPARISON:  None. FINDINGS: Contralateral Common Femoral Vein: Respiratory phasicity is normal and symmetric with the symptomatic side. No evidence of thrombus. Normal compressibility. Common Femoral Vein: No evidence of thrombus. Normal compressibility, respiratory phasicity and response to augmentation. Saphenofemoral Junction: No evidence of thrombus. Normal compressibility and flow on color Doppler imaging. Profunda Femoral Vein: No evidence of thrombus. Normal compressibility and flow on color Doppler imaging. Femoral Vein: No evidence of thrombus. Normal compressibility, respiratory phasicity and response to augmentation. Popliteal Vein: No evidence of thrombus. Normal compressibility, respiratory phasicity and response to augmentation. Calf Veins: No evidence of thrombus. Normal compressibility and flow on color Doppler imaging. Superficial Great Saphenous Vein: No evidence of thrombus. Normal compressibility. Venous Reflux:  None. Other Findings:  None. IMPRESSION: No evidence of deep venous thrombosis in the left lower extremity. Electronically Signed   By: Ilona Sorrel M.D.   On: 01/30/2019 13:10    Procedures Procedures (including critical care time)  Medications Ordered in ED Medications - No data to display   Initial Impression / Assessment and Plan / ED Course  I have reviewed the triage vital signs and the nursing notes.  Pertinent labs & imaging results that were available during my care of the patient were reviewed by me and considered in my medical decision making (see chart for details).       Rash in lower leg looks different than rash in thigh.  Lower leg most consistent with early cellulitis vs DVT skin changes.  Rash to upper thigh is non blanchable, but tender, warm as well.  Has been compliant with his warfarin and making DVT less likely but has risk factors for clot.  No fever, chills to suggest  systemic infection from cellulitis. No CP, SOB, hypoxemia, tachyardia, tachypnea to suggest PE.   Vasculitis is also a consideration.  Will start with vascular US.   Final Clinical Impressions(s) / ED Diagnoses   1349: Ultrasound negative for DVT.  I discussed results with patient.  The skin changes to the lower leg are mild but most consistent with early cellulitis.  Given his past medical history, risk, reasonable to start on antibiotic for this.  No fever, tachycardia, constitutional symptoms to warrant labs.  Discussed blood work in the ED likely will not change management and discharge, and patient felt comfortable deferring these today.  The skin changes to the left thigh are not classic of cellulitis, may be erysipelas or may be unrelated.  This rash is not blanchable but tender, nonpruritic.  Vasculitis and dermatitis considered less likely.  He has no petechiae in his oropharynx and buccal mucosa.  Will discharge with clindamycin, Tylenol, elevation.  We will mark the redness in his leg to track progression.  Encouraged close follow-up with PCP or dermatologist.  Return precautions were given.  Patient is comfortable with this.  Discussed with EDMD. Final diagnoses:  Right leg swelling  Redness and swelling of lower leg    ED Discharge Orders         Ordered  clindamycin (CLEOCIN) 150 MG capsule  3 times daily     01/30/19 1354           Kinnie Feil, PA-C 01/30/19 1400    Hayden Rasmussen, MD 01/31/19 [phone]

## 2019-01-30 NOTE — Discharge Instructions (Addendum)
You were seen in the ED for left leg swelling, redness, pain and rash to your thigh.  Ultrasound did not show a blood clot in the leg.  The redness, pain and swelling of your lower leg may be from an early skin infection called cellulitis.  This may have happened from the recent cut you had in your leg.  We will treat you with an antibiotic for this.  Take Tylenol every 6 hours for the pain and swelling.  Elevate your leg.  Monitor the redness spreading, return to the ED if the redness spreads past the marker lines despite antibiotics or there is fever, chills, body aches.  The rash to the upper thigh does not look typical of cellulitis.  This may be unrelated.  I recommend he follow-up with your dermatologist or primary care doctor as soon as you are able to for further evaluation of this.

## 2019-02-08 DIAGNOSIS — E291 Testicular hypofunction: Secondary | ICD-10-CM | POA: Diagnosis not present

## 2019-02-08 DIAGNOSIS — Z7901 Long term (current) use of anticoagulants: Secondary | ICD-10-CM | POA: Diagnosis not present

## 2019-02-16 DIAGNOSIS — Z7901 Long term (current) use of anticoagulants: Secondary | ICD-10-CM | POA: Diagnosis not present

## 2019-02-22 DIAGNOSIS — Z7984 Long term (current) use of oral hypoglycemic drugs: Secondary | ICD-10-CM | POA: Diagnosis not present

## 2019-02-22 DIAGNOSIS — E119 Type 2 diabetes mellitus without complications: Secondary | ICD-10-CM | POA: Diagnosis not present

## 2019-02-22 DIAGNOSIS — H2513 Age-related nuclear cataract, bilateral: Secondary | ICD-10-CM | POA: Diagnosis not present

## 2019-02-22 DIAGNOSIS — E291 Testicular hypofunction: Secondary | ICD-10-CM | POA: Diagnosis not present

## 2019-02-28 ENCOUNTER — Encounter: Payer: Self-pay | Admitting: Cardiology

## 2019-02-28 ENCOUNTER — Telehealth: Payer: Self-pay

## 2019-02-28 ENCOUNTER — Other Ambulatory Visit: Payer: Self-pay

## 2019-02-28 ENCOUNTER — Telehealth (INDEPENDENT_AMBULATORY_CARE_PROVIDER_SITE_OTHER): Payer: 59 | Admitting: Cardiology

## 2019-02-28 VITALS — Ht 70.0 in | Wt 217.0 lb

## 2019-02-28 DIAGNOSIS — I6523 Occlusion and stenosis of bilateral carotid arteries: Secondary | ICD-10-CM

## 2019-02-28 DIAGNOSIS — E7800 Pure hypercholesterolemia, unspecified: Secondary | ICD-10-CM

## 2019-02-28 DIAGNOSIS — I2089 Other forms of angina pectoris: Secondary | ICD-10-CM

## 2019-02-28 DIAGNOSIS — I2510 Atherosclerotic heart disease of native coronary artery without angina pectoris: Secondary | ICD-10-CM

## 2019-02-28 DIAGNOSIS — I208 Other forms of angina pectoris: Secondary | ICD-10-CM

## 2019-02-28 NOTE — Patient Instructions (Signed)
  Medication Instructions:  The current medical regimen is effective;  continue present plan and medications.  If you need a refill on your cardiac medications before your next appointment, please call your pharmacy.   Follow-Up: Follow up in 1 year with Dr. Skains.  You will receive a letter in the mail 2 months before you are due.  Please call us when you receive this letter to schedule your follow up appointment.  Thank you for choosing [facility]!!     

## 2019-02-28 NOTE — Telephone Encounter (Signed)
YOUR CARDIOLOGY TEAM HAS ARRANGED FOR AN E-VISIT FOR YOUR APPOINTMENT - PLEASE REVIEW IMPORTANT INFORMATION BELOW SEVERAL DAYS PRIOR TO YOUR APPOINTMENT  Due to the recent COVID-19 pandemic, we are transitioning in-person office visits to tele-medicine visits in an effort to decrease unnecessary exposure to our patients, their families, and staff. These visits are billed to your insurance just like a normal visit is. We also encourage you to sign up for MyChart if you have not already done so. You will need a smartphone if possible. For patients that do not have this, we can still complete the visit using a regular telephone but do prefer a smartphone to enable video when possible. You may have a family member that lives with you that can help. If possible, we also ask that you have a blood pressure cuff and scale at home to measure your blood pressure, heart rate and weight prior to your scheduled appointment. Patients with clinical needs that need an in-person evaluation and testing will still be able to come to the office if absolutely necessary. If you have any questions, feel free to call our office.     YOUR PROVIDER WILL BE USING THE FOLLOWING PLATFORM TO COMPLETE YOUR VISIT: Doxy.Me  . IF USING MYCHART - How to Download the MyChart App to Your SmartPhone   - If Apple, go to App Store and type in MyChart in the search bar and download the app. If Android, ask patient to go to Google Play Store and type in MyChart in the search bar and download the app. The app is free but as with any other app downloads, your phone may require you to verify saved payment information or Apple/Android password.  - You will need to then log into the app with your MyChart username and password, and select [facility] as your healthcare provider to link the account.  - When it is time for your visit, go to the MyChart app, find appointments, and click Begin Video Visit. Be sure to Select Allow for your device to  access the Microphone and Camera for your visit. You will then be connected, and your provider will be with you shortly.  **If you have any issues connecting or need assistance, please contact MyChart service desk [phone] ([phone])**  **If using a computer, in order to ensure the best quality for your visit, you will need to use either of the following Internet Browsers: Google Chrome or Microsoft Edge**  . IF USING DOXIMITY or [email or web address].ME - The staff will give you instructions on receiving your link to join the meeting the day of your visit.      2-3 DAYS BEFORE YOUR APPOINTMENT  You will receive a telephone call from one of our [facility] team members - your caller ID may say "Unknown caller." If this is a video visit, we will walk you through how to get the video launched on your phone. We will remind you check your blood pressure, heart rate and weight prior to your scheduled appointment. If you have an Apple Watch or Kardia, please upload any pertinent ECG strips the day before or morning of your appointment to MyChart. Our staff will also make sure you have reviewed the consent and agree to move forward with your scheduled tele-health visit.     THE DAY OF YOUR APPOINTMENT  Approximately 15 minutes prior to your scheduled appointment, you will receive a telephone call from one of Heart[facility] team - your caller ID may say "Unknown caller."    Our staff will confirm medications, vital signs for the day and any symptoms you may be experiencing. Please have this information available prior to the time of visit start. It may also be helpful for you to have a pad of paper and pen handy for any instructions given during your visit. They will also walk you through joining the smartphone meeting if this is a video visit.    CONSENT FOR TELE-HEALTH VISIT - PLEASE REVIEW  I hereby voluntarily request, consent and authorize [facility] and its employed or contracted physicians, physician  assistants, nurse practitioners or other licensed health care professionals (the Practitioner), to provide me with telemedicine health care services (the "Services") as deemed necessary by the treating Practitioner. I acknowledge and consent to receive the Services by the Practitioner via telemedicine. I understand that the telemedicine visit will involve communicating with the Practitioner through live audiovisual communication technology and the disclosure of certain medical information by electronic transmission. I acknowledge that I have been given the opportunity to request an in-person assessment or other available alternative prior to the telemedicine visit and am voluntarily participating in the telemedicine visit.  I understand that I have the right to withhold or withdraw my consent to the use of telemedicine in the course of my care at any time, without affecting my right to future care or treatment, and that the Practitioner or I may terminate the telemedicine visit at any time. I understand that I have the right to inspect all information obtained and/or recorded in the course of the telemedicine visit and may receive copies of available information for a reasonable fee.  I understand that some of the potential risks of receiving the Services via telemedicine include:  . Delay or interruption in medical evaluation due to technological equipment failure or disruption; . Information transmitted may not be sufficient (e.g. poor resolution of images) to allow for appropriate medical decision making by the Practitioner; and/or  . In rare instances, security protocols could fail, causing a breach of personal health information.  Furthermore, I acknowledge that it is my responsibility to provide information about my medical history, conditions and care that is complete and accurate to the best of my ability. I acknowledge that Practitioner's advice, recommendations, and/or decision may be based on  factors not within their control, such as incomplete or inaccurate data provided by me or distortions of diagnostic images or specimens that may result from electronic transmissions. I understand that the practice of medicine is not an exact science and that Practitioner makes no warranties or guarantees regarding treatment outcomes. I acknowledge that I will receive a copy of this consent concurrently upon execution via email to the email address I last provided but may also request a printed copy by calling the office of [facility].    I understand that my insurance will be billed for this visit.   I have read or had this consent read to me. . I understand the contents of this consent, which adequately explains the benefits and risks of the Services being provided via telemedicine.  . I have been provided ample opportunity to ask questions regarding this consent and the Services and have had my questions answered to my satisfaction. . I give my informed consent for the services to be provided through the use of telemedicine in my medical care  By participating in this telemedicine visit I agree to the above.  

## 2019-02-28 NOTE — Progress Notes (Signed)
Virtual Visit via Telephone Note   This visit type was conducted due to national recommendations for restrictions regarding the COVID-19 Pandemic (e.g. social distancing) in an effort to limit this patient's exposure and mitigate transmission in our community.  Due to his co-morbid illnesses, this patient is at least at moderate risk for complications without adequate follow up.  This format is felt to be most appropriate for this patient at this time.  The patient did not have access to video technology/had technical difficulties with video requiring transitioning to audio format only (telephone).  All issues noted in this document were discussed and addressed.  No physical exam could be performed with this format.  Please refer to the patient's chart for his  consent to telehealth for [facility].   Date:  02/28/2019   ID:  Evan [name], DOB 27-Aug-1946, MRN [id number]  Patient Location: Home Provider Location: Home  PCP:  Josetta Huddle, MD  Cardiologist:  Candee Furbish, MD  Electrophysiologist:  None   Evaluation Performed:  Follow-Up Visit  Chief Complaint:  CAD follow up.  History of Present Illness:    Evan [name] is a 73 y.o. male with coronary disease post CABG 1999 with cardiac catheterization in 2000 showing occluded diagonal graft and occluded right coronary artery graft but patent LIMA to LAD here for follow-up.  Back in 2010 had a stress test with no ischemia but since he was having angina on the treadmill we tried Ranexa for a while.  This was stopped in 2017 and he has been doing well without it.  He has been on Coumadin for bilateral PE in the setting of melanoma resection from his neck several years ago.  Dr. Inda Merlin had been monitoring.  Dr. Scot Dock has been watching his carotid arteries.  Overall been doing quite well, denies any fevers chills nausea vomiting syncope bleeding.  Does have a cough snoring.  Used to work at the [employer]. Did some work on  the house.   Had stomach bug last night. No fevers. Diarrhea.  Seems to be resolving.  Staying hydrated.  Did go to church last "Sunday  On mother's day, ER cellulitis. Afraid of left leg blood clots, swollen. Negative. Fell in yard, dog may have licked and introduced bacteria. Abx, better.   The patient does not have symptoms concerning for COVID-19 infection (fever, chills, cough, or new shortness of breath).    Past Medical History:  Diagnosis Date  . Cancer (HCC)    chemo- [facility]- 10 yrs since any tx. for melanoma.Radiation([facility]) also.  . Cataracts, bilateral   . Chronic renal insufficiency   . Coronary artery disease    '99 -stents, then CABPG-stents failed. Dr. Nyeemah Jennette, cardiologist.  . Depression   . Diabetes mellitus   . Hypercholesteremia   . Hypertension    Evan [name]  . Kidney stone   . Pulmonary emboli (HCC)    tx. Coumadin-no problems now  . Sleep apnea    no cpap use at this time(not in over a year).   Past Surgical History:  Procedure Laterality Date  . CARDIAC CATHETERIZATION     x3     last 1999  . CHOLECYSTECTOMY     lap. Cholecystectomy about 5-8 yrs ago  . COLONOSCOPY WITH PROPOFOL N/A 09/26/2014   Procedure: COLONOSCOPY WITH PROPOFOL;  Surgeon: Martin K Johnson, MD;  Location: [facility];  Service: Endoscopy;  Laterality: N/A;  . CORONARY ARTERY BYPASS GRAFT     19" 99  .  MASS EXCISION  04/15/2012   Procedure: EXCISION MASS;  Surgeon: [name], MD;  Location: [facility] OR;  Service: ENT;  Laterality: Left;  Left tonsil biopsy  . melanomia     several skin ca removed-nose, left ankle, parotid gland left, right nodes-being followed annually..     Current Meds  Medication Sig  . amLODipine (NORVASC) 5 MG tablet Take 1 tablet (5 mg total) by mouth daily.  [name] aspirin EC 81 MG tablet Take 81 mg by mouth every evening.  . cholecalciferol (VITAMIN D) 1000 UNITS tablet Take 2,000 Units by mouth every morning.   . citalopram (CELEXA) 40 MG tablet Take  40 mg by mouth every morning.  [name] co-enzyme Q-10 50 MG capsule Take 100 mg by mouth every morning.  . Cranberry 500 MG CAPS Take 500 mg by mouth daily.  . fenofibrate micronized (LOFIBRA) 67 MG capsule TAKE 1 CAPSULE DAILY  . FLAXSEED, LINSEED, PO Take 2 tablets by mouth 2 (two) times daily.  . folic acid (FOLVITE) 423 MCG tablet Take 400 mcg by mouth daily.  [name] gabapentin (NEURONTIN) 300 MG capsule Take 1 capsule by mouth daily.  [name] glyBURIDE (DIABETA) 5 MG tablet Take 5 mg by mouth every evening.  . metFORMIN (GLUCOPHAGE) 1000 MG tablet Take 1,000 mg by mouth 2 (two) times daily with a meal.  . metoprolol succinate (TOPROL-XL) 50 MG 24 hr tablet TAKE 1 TABLET DAILY WITH OR IMMEDIATELY FOLLOWING A MEAL  . Multiple Vitamin (MULITIVITAMIN WITH MINERALS) TABS Take 1 tablet by mouth every evening.  [name] omega-3 acid ethyl esters (LOVAZA) 1 g capsule TAKE 2 CAPSULES TWICE A DAY (PLEASE KEEP UPCOMING APPOINTMENT FOR FUTURE REFILLS)  . pantoprazole (PROTONIX) 40 MG tablet Take 40 mg by mouth daily.  . pioglitazone (ACTOS) 30 MG tablet Take 30 mg by mouth every morning.  . potassium gluconate 595 MG TABS tablet Take 90 mg by mouth daily.   . rosuvastatin (CRESTOR) 20 MG tablet Take 20 mg by mouth every evening.  . Tamsulosin HCl (FLOMAX) 0.4 MG CAPS Take 0.8 mg by mouth every evening.   . testosterone cypionate (DEPOTESTOTERONE CYPIONATE) 100 MG/ML injection Inject 150 mg into the muscle every 14 (fourteen) days. For IM use only  . valsartan (DIOVAN) 160 MG tablet Take 80 mg by mouth every morning.  . vardenafil (LEVITRA) 20 MG tablet Take 20 mg by mouth daily as needed. For E.D.  . vitamin C (ASCORBIC ACID) 500 MG tablet Take 1,000 mg by mouth every morning.  . warfarin (COUMADIN) 5 MG tablet Take 5 mg by mouth every evening. As directed     Allergies:   Doxycycline and Penicillins   Social History   Tobacco Use  . Smoking status: Never Smoker  . Smokeless tobacco: Never Used  Substance Use Topics   . Alcohol use: No  . Drug use: No     Family Hx: The patient's family history is not on file.  No early family history of CAD  ROS:   Please see the history of present illness.    Denies any fevers chills nausea vomiting syncope bleeding All other systems reviewed and are negative.   Prior CV studies:   The following studies were reviewed today:  ECHO 02/27/15 - Left ventricle: The cavity size was mildly dilated. Wall  thickness was normal. Systolic function was normal. The estimated  ejection fraction was in the range of 55% to 60%. Wall motion was  normal; there were no regional wall motion abnormalities.  Labs/Other Tests and Data Reviewed:    EKG:  An ECG dated 03/04/18 was personally reviewed today and demonstrated:  NSR RBBB, LAD  Recent Labs: No results found for requested labs within last 8760 hours.   Recent Lipid Panel No results found for: CHOL, TRIG, HDL, CHOLHDL, LDLCALC, LDLDIRECT  Wt Readings from Last 3 Encounters:  02/28/19 217 lb (98.4 kg)  01/30/19 220 lb (99.8 kg)  05/17/18 236 lb 8 oz (107.3 kg)     Objective:    Vital Signs:  Ht 5\' 10"  (1.778 m)   Wt 217 lb (98.4 kg)   BMI 31.14 kg/m  110/72.  VITAL SIGNS:  reviewed pleasant, alert, doing well  ASSESSMENT & PLAN:    Coronary artery disease status post CABG - Patent LIMA to LAD, occluded diagonal and RCA vein graft.  Normal EF.  No anginal symptoms.  Used to be on Ranexa at one point but now is off.  Angina -Currently well controlled with metoprolol and amlodipine.  No need for further Ranexa.  Essential hypertension - Currently fairly well controlled on average.  No changes made.  Medications reviewed.  Mixed hyperlipidemia - Fibrate, fish oil (Lovaza), Crestor 20.  LDL goal less than 70  Chronic anticoagulation - Coumadin for prior PE. Coumadin is checked at Dr. Inda Merlin.   Carotid artery disease - Dr. Scot Dock.  Note, has had prior lymph node resection and neck which could  possibly complicate carotid endarterectomy if this is needed.  COVID-19 Education: The signs and symptoms of COVID-19 were discussed with the patient and how to seek care for testing (follow up with PCP or arrange E-visit).  The importance of social distancing was discussed today.  Time:   Today, I have spent 13 minutes with the patient with telehealth technology discussing the above problems.     Medication Adjustments/Labs and Tests Ordered: Current medicines are reviewed at length with the patient today.  Concerns regarding medicines are outlined above.   Tests Ordered: No orders of the defined types were placed in this encounter.   Medication Changes: No orders of the defined types were placed in this encounter.   Disposition:  Follow up in 1 year(s)  Signed, Candee Furbish, MD  02/28/2019 2:42 PM    [facility]

## 2019-03-07 ENCOUNTER — Ambulatory Visit: Payer: 59 | Admitting: Cardiology

## 2019-03-08 DIAGNOSIS — E291 Testicular hypofunction: Secondary | ICD-10-CM | POA: Diagnosis not present

## 2019-03-21 ENCOUNTER — Other Ambulatory Visit: Payer: Self-pay | Admitting: Cardiology

## 2019-03-22 DIAGNOSIS — Z7901 Long term (current) use of anticoagulants: Secondary | ICD-10-CM | POA: Diagnosis not present

## 2019-03-22 DIAGNOSIS — E291 Testicular hypofunction: Secondary | ICD-10-CM | POA: Diagnosis not present

## 2019-04-05 DIAGNOSIS — E291 Testicular hypofunction: Secondary | ICD-10-CM | POA: Diagnosis not present

## 2019-04-09 ENCOUNTER — Other Ambulatory Visit: Payer: Self-pay | Admitting: Cardiology

## 2019-04-12 ENCOUNTER — Other Ambulatory Visit: Payer: Self-pay | Admitting: Cardiology

## 2019-04-12 DIAGNOSIS — L578 Other skin changes due to chronic exposure to nonionizing radiation: Secondary | ICD-10-CM | POA: Diagnosis not present

## 2019-04-12 DIAGNOSIS — L718 Other rosacea: Secondary | ICD-10-CM | POA: Diagnosis not present

## 2019-04-12 DIAGNOSIS — L820 Inflamed seborrheic keratosis: Secondary | ICD-10-CM | POA: Diagnosis not present

## 2019-04-12 DIAGNOSIS — L218 Other seborrheic dermatitis: Secondary | ICD-10-CM | POA: Diagnosis not present

## 2019-04-12 DIAGNOSIS — L814 Other melanin hyperpigmentation: Secondary | ICD-10-CM | POA: Diagnosis not present

## 2019-04-12 DIAGNOSIS — D225 Melanocytic nevi of trunk: Secondary | ICD-10-CM | POA: Diagnosis not present

## 2019-04-12 DIAGNOSIS — Z85828 Personal history of other malignant neoplasm of skin: Secondary | ICD-10-CM | POA: Diagnosis not present

## 2019-04-12 DIAGNOSIS — L821 Other seborrheic keratosis: Secondary | ICD-10-CM | POA: Diagnosis not present

## 2019-04-12 DIAGNOSIS — Z85820 Personal history of malignant melanoma of skin: Secondary | ICD-10-CM | POA: Diagnosis not present

## 2019-04-12 DIAGNOSIS — D1801 Hemangioma of skin and subcutaneous tissue: Secondary | ICD-10-CM | POA: Diagnosis not present

## 2019-04-12 DIAGNOSIS — L570 Actinic keratosis: Secondary | ICD-10-CM | POA: Diagnosis not present

## 2019-04-19 DIAGNOSIS — E291 Testicular hypofunction: Secondary | ICD-10-CM | POA: Diagnosis not present

## 2019-04-19 DIAGNOSIS — Z7901 Long term (current) use of anticoagulants: Secondary | ICD-10-CM | POA: Diagnosis not present

## 2019-05-03 DIAGNOSIS — E291 Testicular hypofunction: Secondary | ICD-10-CM | POA: Diagnosis not present

## 2019-05-03 DIAGNOSIS — Z7901 Long term (current) use of anticoagulants: Secondary | ICD-10-CM | POA: Diagnosis not present

## 2019-05-10 DIAGNOSIS — Z08 Encounter for follow-up examination after completed treatment for malignant neoplasm: Secondary | ICD-10-CM | POA: Diagnosis not present

## 2019-05-10 DIAGNOSIS — C4372 Malignant melanoma of left lower limb, including hip: Secondary | ICD-10-CM | POA: Diagnosis not present

## 2019-05-10 DIAGNOSIS — Z85820 Personal history of malignant melanoma of skin: Secondary | ICD-10-CM | POA: Diagnosis not present

## 2019-05-30 DIAGNOSIS — N401 Enlarged prostate with lower urinary tract symptoms: Secondary | ICD-10-CM | POA: Diagnosis not present

## 2019-05-30 DIAGNOSIS — R351 Nocturia: Secondary | ICD-10-CM | POA: Diagnosis not present

## 2019-05-31 DIAGNOSIS — E291 Testicular hypofunction: Secondary | ICD-10-CM | POA: Diagnosis not present

## 2019-05-31 DIAGNOSIS — I2690 Septic pulmonary embolism without acute cor pulmonale: Secondary | ICD-10-CM | POA: Diagnosis not present

## 2019-05-31 DIAGNOSIS — Z7901 Long term (current) use of anticoagulants: Secondary | ICD-10-CM | POA: Diagnosis not present

## 2019-06-14 DIAGNOSIS — E291 Testicular hypofunction: Secondary | ICD-10-CM | POA: Diagnosis not present

## 2019-06-28 DIAGNOSIS — Z7901 Long term (current) use of anticoagulants: Secondary | ICD-10-CM | POA: Diagnosis not present

## 2019-06-28 DIAGNOSIS — Z23 Encounter for immunization: Secondary | ICD-10-CM | POA: Diagnosis not present

## 2019-07-12 DIAGNOSIS — E291 Testicular hypofunction: Secondary | ICD-10-CM | POA: Diagnosis not present

## 2019-07-12 DIAGNOSIS — L578 Other skin changes due to chronic exposure to nonionizing radiation: Secondary | ICD-10-CM | POA: Diagnosis not present

## 2019-07-12 DIAGNOSIS — L570 Actinic keratosis: Secondary | ICD-10-CM | POA: Diagnosis not present

## 2019-07-12 DIAGNOSIS — L738 Other specified follicular disorders: Secondary | ICD-10-CM | POA: Diagnosis not present

## 2019-07-12 DIAGNOSIS — L718 Other rosacea: Secondary | ICD-10-CM | POA: Diagnosis not present

## 2019-07-12 DIAGNOSIS — S80812A Abrasion, left lower leg, initial encounter: Secondary | ICD-10-CM | POA: Diagnosis not present

## 2019-07-19 DIAGNOSIS — G4733 Obstructive sleep apnea (adult) (pediatric): Secondary | ICD-10-CM | POA: Diagnosis not present

## 2019-07-26 DIAGNOSIS — I2690 Septic pulmonary embolism without acute cor pulmonale: Secondary | ICD-10-CM | POA: Diagnosis not present

## 2019-07-26 DIAGNOSIS — Z7901 Long term (current) use of anticoagulants: Secondary | ICD-10-CM | POA: Diagnosis not present

## 2019-07-26 DIAGNOSIS — I2699 Other pulmonary embolism without acute cor pulmonale: Secondary | ICD-10-CM | POA: Diagnosis not present

## 2019-07-26 DIAGNOSIS — T800XXA Air embolism following infusion, transfusion and therapeutic injection, initial encounter: Secondary | ICD-10-CM | POA: Diagnosis not present

## 2019-07-26 DIAGNOSIS — E291 Testicular hypofunction: Secondary | ICD-10-CM | POA: Diagnosis not present

## 2019-08-09 DIAGNOSIS — E291 Testicular hypofunction: Secondary | ICD-10-CM | POA: Diagnosis not present

## 2019-08-23 DIAGNOSIS — Z7901 Long term (current) use of anticoagulants: Secondary | ICD-10-CM | POA: Diagnosis not present

## 2019-08-23 DIAGNOSIS — E291 Testicular hypofunction: Secondary | ICD-10-CM | POA: Diagnosis not present

## 2019-09-06 DIAGNOSIS — E291 Testicular hypofunction: Secondary | ICD-10-CM | POA: Diagnosis not present

## 2019-09-20 DIAGNOSIS — Z7901 Long term (current) use of anticoagulants: Secondary | ICD-10-CM | POA: Diagnosis not present

## 2019-09-20 DIAGNOSIS — E291 Testicular hypofunction: Secondary | ICD-10-CM | POA: Diagnosis not present

## 2019-10-04 DIAGNOSIS — E291 Testicular hypofunction: Secondary | ICD-10-CM | POA: Diagnosis not present

## 2019-10-17 DIAGNOSIS — E559 Vitamin D deficiency, unspecified: Secondary | ICD-10-CM | POA: Diagnosis not present

## 2019-10-17 DIAGNOSIS — Z86711 Personal history of pulmonary embolism: Secondary | ICD-10-CM | POA: Diagnosis not present

## 2019-10-17 DIAGNOSIS — E291 Testicular hypofunction: Secondary | ICD-10-CM | POA: Diagnosis not present

## 2019-10-17 DIAGNOSIS — Z0000 Encounter for general adult medical examination without abnormal findings: Secondary | ICD-10-CM | POA: Diagnosis not present

## 2019-10-17 DIAGNOSIS — E1121 Type 2 diabetes mellitus with diabetic nephropathy: Secondary | ICD-10-CM | POA: Diagnosis not present

## 2019-10-17 DIAGNOSIS — Z1389 Encounter for screening for other disorder: Secondary | ICD-10-CM | POA: Diagnosis not present

## 2019-10-17 DIAGNOSIS — E1142 Type 2 diabetes mellitus with diabetic polyneuropathy: Secondary | ICD-10-CM | POA: Diagnosis not present

## 2019-10-17 DIAGNOSIS — Z125 Encounter for screening for malignant neoplasm of prostate: Secondary | ICD-10-CM | POA: Diagnosis not present

## 2019-10-17 DIAGNOSIS — I10 Essential (primary) hypertension: Secondary | ICD-10-CM | POA: Diagnosis not present

## 2019-10-17 DIAGNOSIS — I25118 Atherosclerotic heart disease of native coronary artery with other forms of angina pectoris: Secondary | ICD-10-CM | POA: Diagnosis not present

## 2019-10-17 DIAGNOSIS — Z7901 Long term (current) use of anticoagulants: Secondary | ICD-10-CM | POA: Diagnosis not present

## 2019-10-17 DIAGNOSIS — G4733 Obstructive sleep apnea (adult) (pediatric): Secondary | ICD-10-CM | POA: Diagnosis not present

## 2019-10-17 IMAGING — US VENOUS DOPPLER ULTRASOUND OF LEFT LOWER EXTREMITY
1 series · 13 of 24 positions shown · non-contrast
Comparison: None.

CLINICAL DATA: Left lower extremity swelling and erythema. Recent
fall. History of left lower extremity DVT and PE, on
anticoagulation.



[Series 1: venous doppler ultrasound of left lower extremity · 13 of 33 slices shown]
[im 1/33]
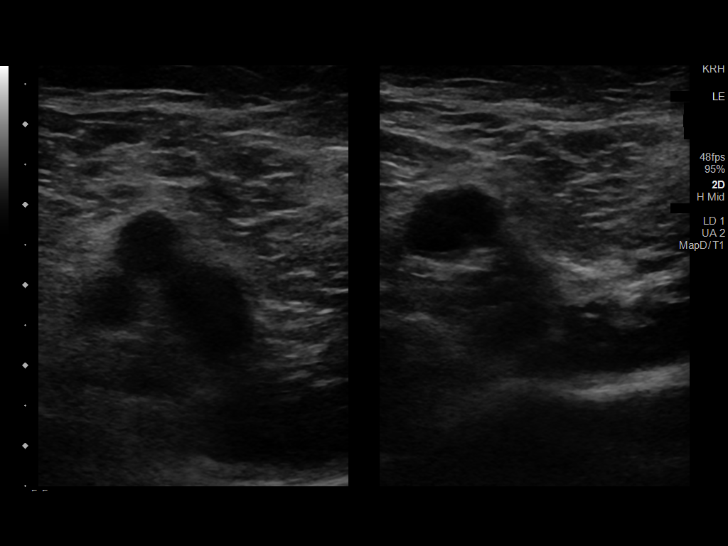
[im 3/33]
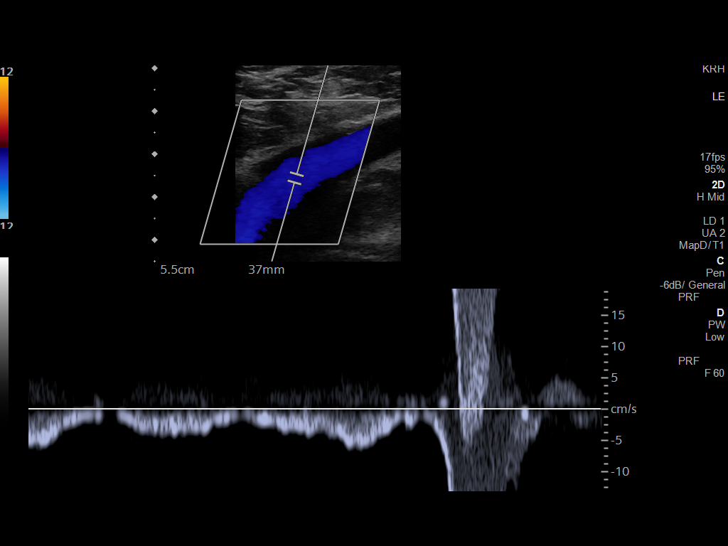
[im 6/33]
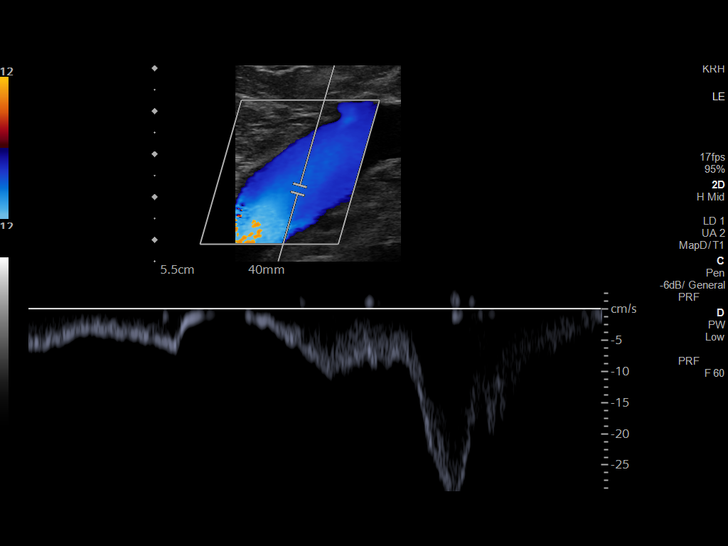
[im 9/33]
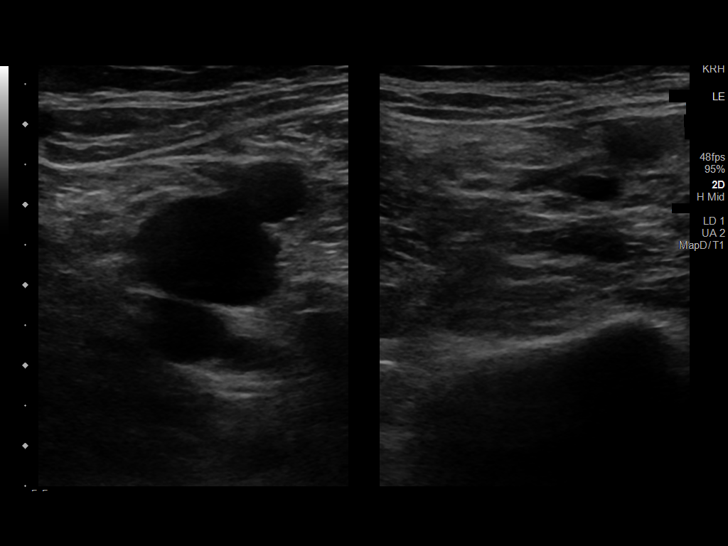
[im 12/33]
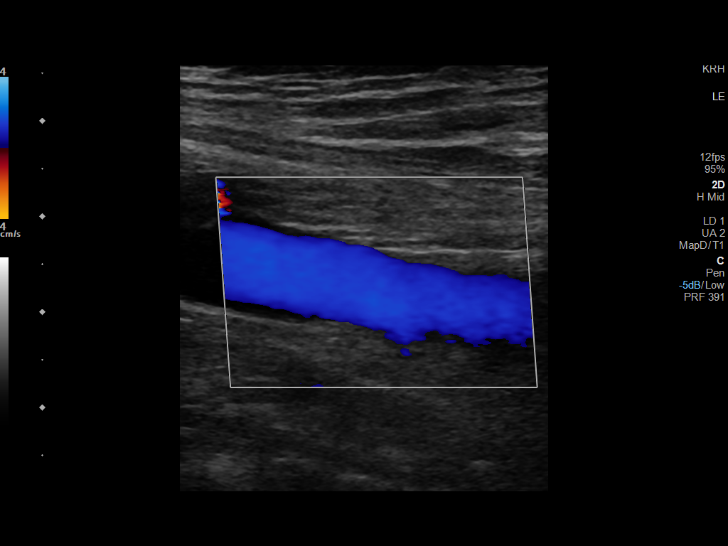
[im 14/33]
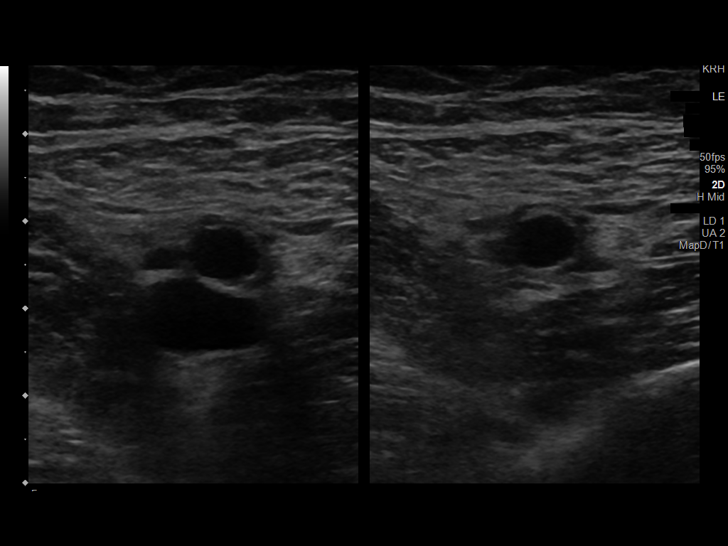
[im 17/33]
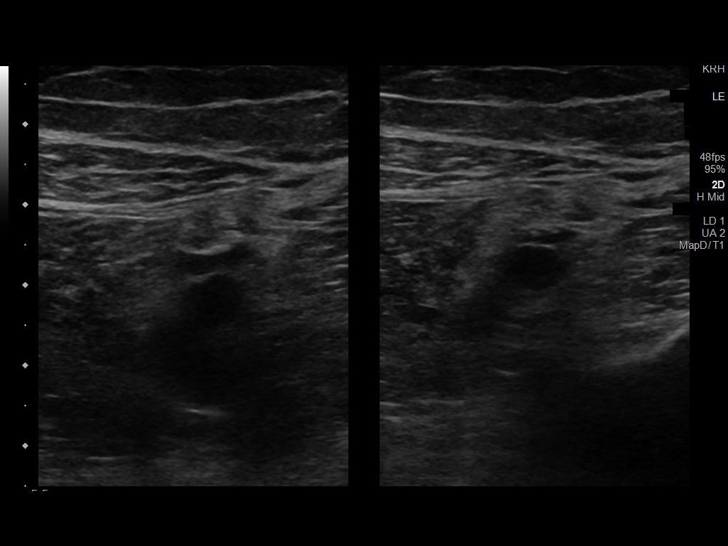
[im 19/33]
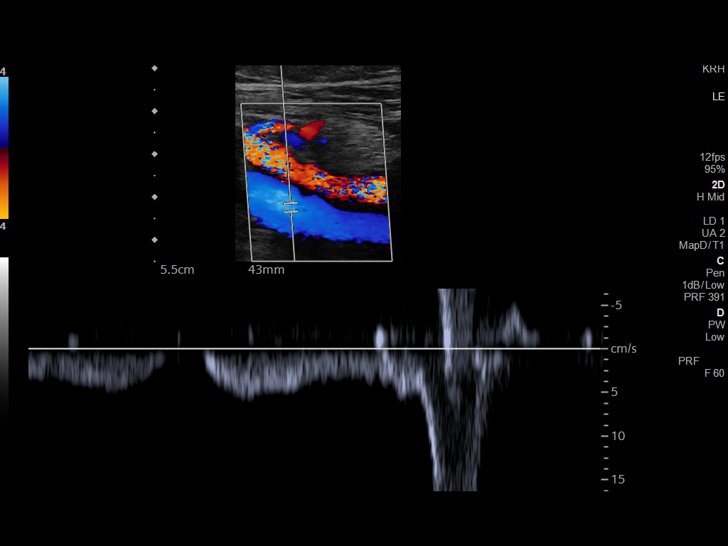
[im 21/33]
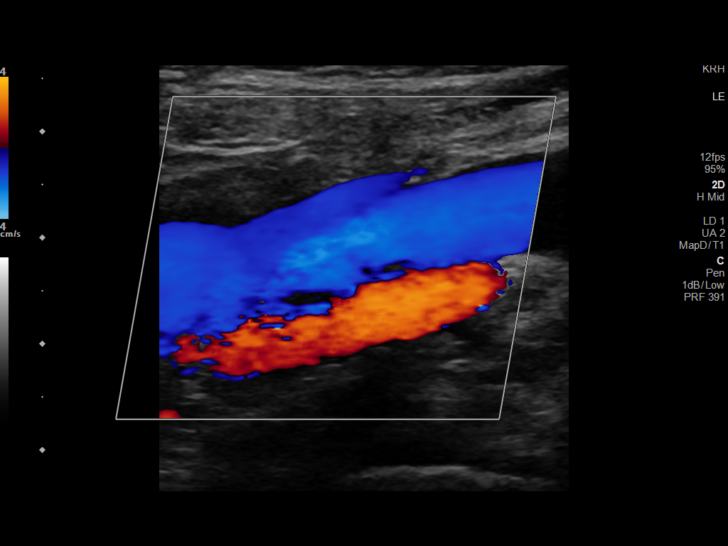
[im 24/33]
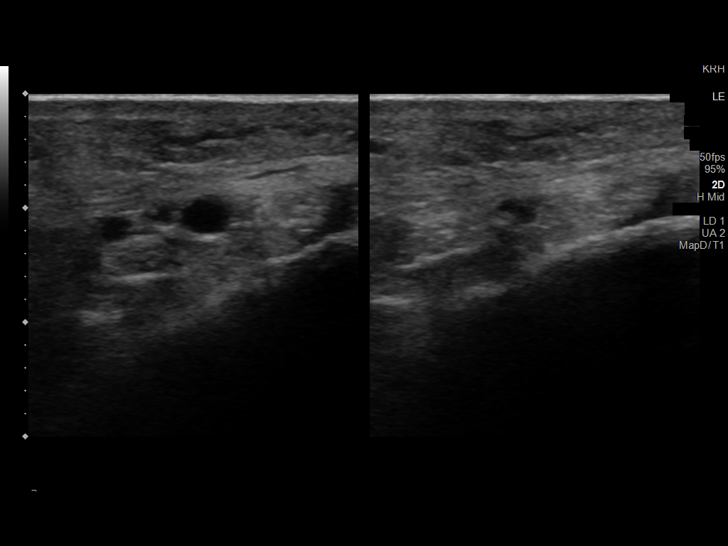
[im 27/33]
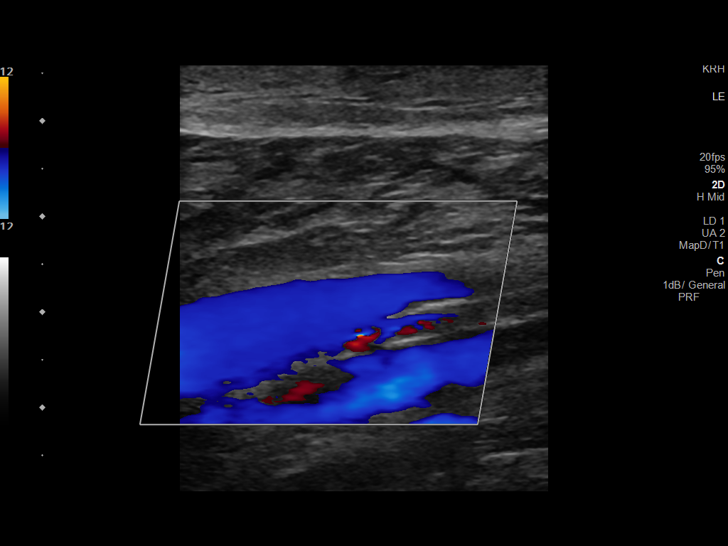
[im 30/33]
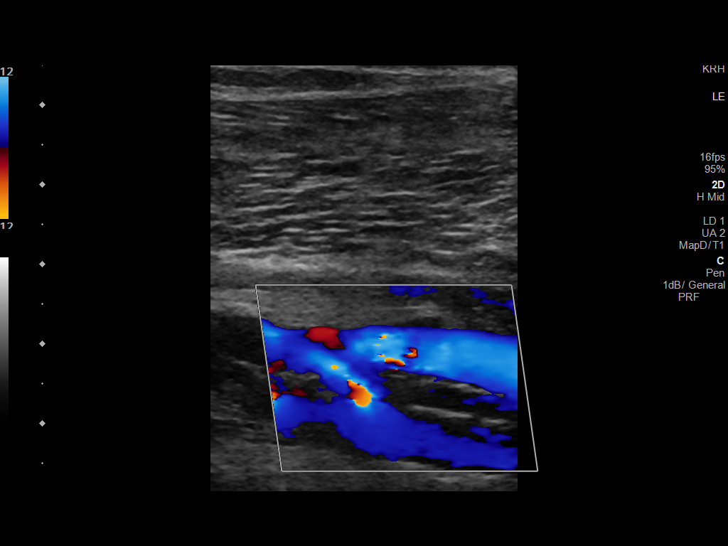
[im 33/33]
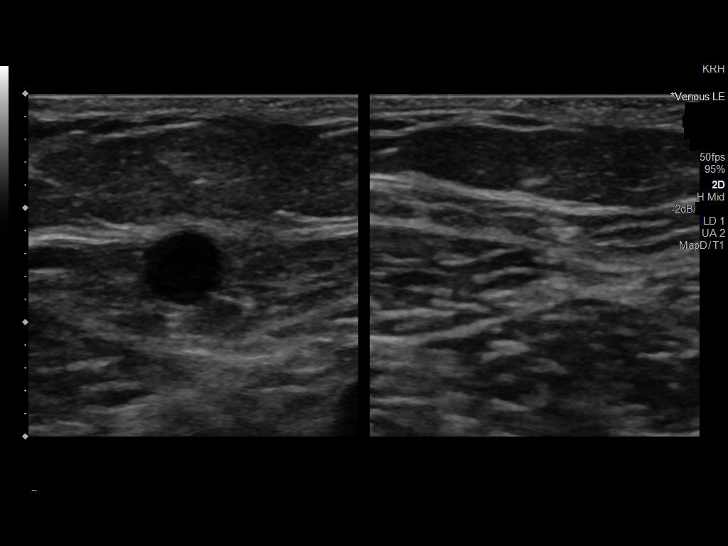

[13 of 24 positions shown; findings below may reference images not displayed]

FINDINGS: Contralateral Common Femoral Vein: Respiratory phasicity is normal
and symmetric with the symptomatic side. No evidence of thrombus.
Normal compressibility.

Common Femoral Vein: No evidence of thrombus. Normal
compressibility, respiratory phasicity and response to augmentation.

Saphenofemoral Junction: No evidence of thrombus. Normal
compressibility and flow on color Doppler imaging.

Profunda Femoral Vein: No evidence of thrombus. Normal
compressibility and flow on color Doppler imaging.

Femoral Vein: No evidence of thrombus. Normal compressibility,
respiratory phasicity and response to augmentation.

Popliteal Vein: No evidence of thrombus. Normal compressibility,
respiratory phasicity and response to augmentation.

Calf Veins: No evidence of thrombus. Normal compressibility and flow
on color Doppler imaging.

Superficial Great Saphenous Vein: No evidence of thrombus. Normal
compressibility.

Venous Reflux:  None.

Other Findings:  None.
IMPRESSION: No evidence of deep venous thrombosis in the left lower extremity.

## 2019-10-18 DIAGNOSIS — E291 Testicular hypofunction: Secondary | ICD-10-CM | POA: Diagnosis not present

## 2019-10-18 DIAGNOSIS — Z7901 Long term (current) use of anticoagulants: Secondary | ICD-10-CM | POA: Diagnosis not present

## 2019-11-14 DIAGNOSIS — D225 Melanocytic nevi of trunk: Secondary | ICD-10-CM | POA: Diagnosis not present

## 2019-11-14 DIAGNOSIS — L578 Other skin changes due to chronic exposure to nonionizing radiation: Secondary | ICD-10-CM | POA: Diagnosis not present

## 2019-11-14 DIAGNOSIS — L570 Actinic keratosis: Secondary | ICD-10-CM | POA: Diagnosis not present

## 2019-11-14 DIAGNOSIS — L738 Other specified follicular disorders: Secondary | ICD-10-CM | POA: Diagnosis not present

## 2019-11-14 DIAGNOSIS — Z85820 Personal history of malignant melanoma of skin: Secondary | ICD-10-CM | POA: Diagnosis not present

## 2019-11-14 DIAGNOSIS — L218 Other seborrheic dermatitis: Secondary | ICD-10-CM | POA: Diagnosis not present

## 2019-11-14 DIAGNOSIS — L905 Scar conditions and fibrosis of skin: Secondary | ICD-10-CM | POA: Diagnosis not present

## 2019-11-15 DIAGNOSIS — Z7901 Long term (current) use of anticoagulants: Secondary | ICD-10-CM | POA: Diagnosis not present

## 2019-11-15 DIAGNOSIS — E291 Testicular hypofunction: Secondary | ICD-10-CM | POA: Diagnosis not present

## 2019-11-28 DIAGNOSIS — E291 Testicular hypofunction: Secondary | ICD-10-CM | POA: Diagnosis not present

## 2019-12-12 DIAGNOSIS — Z7901 Long term (current) use of anticoagulants: Secondary | ICD-10-CM | POA: Diagnosis not present

## 2019-12-12 DIAGNOSIS — E291 Testicular hypofunction: Secondary | ICD-10-CM | POA: Diagnosis not present

## 2019-12-26 DIAGNOSIS — E291 Testicular hypofunction: Secondary | ICD-10-CM | POA: Diagnosis not present

## 2020-01-09 DIAGNOSIS — E291 Testicular hypofunction: Secondary | ICD-10-CM | POA: Diagnosis not present

## 2020-01-09 DIAGNOSIS — Z7901 Long term (current) use of anticoagulants: Secondary | ICD-10-CM | POA: Diagnosis not present

## 2020-01-23 DIAGNOSIS — E291 Testicular hypofunction: Secondary | ICD-10-CM | POA: Diagnosis not present

## 2020-01-30 DIAGNOSIS — Z23 Encounter for immunization: Secondary | ICD-10-CM | POA: Diagnosis not present

## 2020-02-06 DIAGNOSIS — Z7901 Long term (current) use of anticoagulants: Secondary | ICD-10-CM | POA: Diagnosis not present

## 2020-02-06 DIAGNOSIS — E291 Testicular hypofunction: Secondary | ICD-10-CM | POA: Diagnosis not present

## 2020-02-20 DIAGNOSIS — L570 Actinic keratosis: Secondary | ICD-10-CM | POA: Diagnosis not present

## 2020-02-20 DIAGNOSIS — L218 Other seborrheic dermatitis: Secondary | ICD-10-CM | POA: Diagnosis not present

## 2020-02-20 DIAGNOSIS — E291 Testicular hypofunction: Secondary | ICD-10-CM | POA: Diagnosis not present

## 2020-02-20 DIAGNOSIS — L111 Transient acantholytic dermatosis [Grover]: Secondary | ICD-10-CM | POA: Diagnosis not present

## 2020-02-20 DIAGNOSIS — L738 Other specified follicular disorders: Secondary | ICD-10-CM | POA: Diagnosis not present

## 2020-02-28 DIAGNOSIS — E1136 Type 2 diabetes mellitus with diabetic cataract: Secondary | ICD-10-CM | POA: Diagnosis not present

## 2020-02-28 DIAGNOSIS — H25813 Combined forms of age-related cataract, bilateral: Secondary | ICD-10-CM | POA: Diagnosis not present

## 2020-03-01 DIAGNOSIS — Z23 Encounter for immunization: Secondary | ICD-10-CM | POA: Diagnosis not present

## 2020-03-05 ENCOUNTER — Encounter: Payer: Self-pay | Admitting: Cardiology

## 2020-03-05 ENCOUNTER — Ambulatory Visit (INDEPENDENT_AMBULATORY_CARE_PROVIDER_SITE_OTHER): Payer: 59 | Admitting: Cardiology

## 2020-03-05 ENCOUNTER — Other Ambulatory Visit: Payer: Self-pay

## 2020-03-05 VITALS — BP 104/60 | HR 52 | Ht 70.0 in | Wt 232.6 lb

## 2020-03-05 DIAGNOSIS — E7800 Pure hypercholesterolemia, unspecified: Secondary | ICD-10-CM | POA: Diagnosis not present

## 2020-03-05 DIAGNOSIS — I6523 Occlusion and stenosis of bilateral carotid arteries: Secondary | ICD-10-CM

## 2020-03-05 DIAGNOSIS — I2510 Atherosclerotic heart disease of native coronary artery without angina pectoris: Secondary | ICD-10-CM

## 2020-03-05 DIAGNOSIS — I208 Other forms of angina pectoris: Secondary | ICD-10-CM

## 2020-03-05 DIAGNOSIS — E291 Testicular hypofunction: Secondary | ICD-10-CM | POA: Diagnosis not present

## 2020-03-05 DIAGNOSIS — Z7901 Long term (current) use of anticoagulants: Secondary | ICD-10-CM | POA: Diagnosis not present

## 2020-03-05 NOTE — Patient Instructions (Signed)
Medication Instructions:  The current medical regimen is effective;  continue present plan and medications.  *If you need a refill on your cardiac medications before your next appointment, please call your pharmacy*  Follow-Up: At [facility], you and your health needs are our priority.  As part of our continuing mission to provide you with exceptional heart care, we have created designated Provider Care Teams.  These Care Teams include your primary Cardiologist (physician) and Advanced Practice Providers (APPs -  Physician Assistants and Nurse Practitioners) who all work together to provide you with the care you need, when you need it.  We recommend signing up for the patient portal called "MyChart".  Sign up information is provided on this After Visit Summary.  MyChart is used to connect with patients for Virtual Visits (Telemedicine).  Patients are able to view lab/test results, encounter notes, upcoming appointments, etc.  Non-urgent messages can be sent to your provider as well.   To learn more about what you can do with MyChart, go to [email or web address].    Your next appointment:   12 month(s)  The format for your next appointment:   In Person  Provider:   [name], MD   Thank you for choosing [facility]!!      

## 2020-03-05 NOTE — Progress Notes (Signed)
Cardiology Office Note:    Date:  03/05/2020   ID:  Evan [name], DOB 1946/08/13, MRN [id number]  PCP:  Evan Huddle, MD  [facility] Cardiologist:  Evan Furbish, MD  [facility] Electrophysiologist:  None   Referring MD: Evan Huddle, MD     History of Present Illness:    Evan [name] is a 74 y.o. male here for follow-up of coronary artery disease post CABG 1999, cardiac cath 2000 showing occluded diagonal graft, occluded right coronary artery graft but patent LIMA to LAD.  2010 stress test did not show any evidence of ischemia.  Had tried some Ranexa for a while given angina but this was stopped.  Doing well without it.  Has a prior history of PE, taking Coumadin.  Vascular surgery, Evan [name] has been watching his carotid arteries.  Still works at the [employer].  Past Medical History:  Diagnosis Date  . Cancer [name] since any tx[facility] for melanoma.Radiation([facility]) also.  . Cataracts, bilateral   . Chronic renal insufficiency   . Coronary artery disease    '99 -stents, then CABPG-stents failed. Dr. Marlou [name], cardiologist.  . Depression   . Diabetes mellitus   . Hypercholesteremia   . Hypertension    Evan [name]  . Kidney stone   . Pulmonary emboli (HCC)    tx. Coumadin-no problems now  . Sleep apnea    no cpap use at this time(not in over a year).    Past Surgical History:  Procedure Laterality Date  . CARDIAC CATHETERIZATION     x3     last 1999  . CHOLECYSTECTOMY     lap. Cholecystectomy about 5-8 yrs ago  . COLONOSCOPY WITH PROPOFOL N/A 09/26/2014   Procedure: COLONOSCOPY WITH PROPOFOL;  Surgeon: Evan Fair, MD;  Location: [facility];  Service: Endoscopy;  Laterality: N/A;  . CORONARY ARTERY BYPASS GRAFT     1999  . MASS EXCISION  04/15/2012   Procedure: EXCISION MASS;  Surgeon: Evan Montane, MD;  Location: [name] OR;  Service: ENT;  Laterality: Left;  Left tonsil biopsy  . melanomia     several skin  ca removed-nose, left ankle, parotid gland left, right nodes-being followed annually..    Current Medications: Current Meds  Medication Sig  . amLODipine (NORVASC) 5 MG tablet TAKE 1 TABLET DAILY  . aspirin EC 81 MG tablet Take 81 mg by mouth every evening.  . cholecalciferol (VITAMIN D) 1000 UNITS tablet Take 2,000 Units by mouth every morning.   . citalopram (CELEXA) 40 MG tablet Take 40 mg by mouth every morning.  [name] co-enzyme Q-10 50 MG capsule Take 100 mg by mouth every morning.  . Cranberry 500 MG CAPS Take 500 mg by mouth daily.  . fenofibrate micronized (LOFIBRA) 67 MG capsule TAKE 1 CAPSULE DAILY  . FLAXSEED, LINSEED, PO Take 2 tablets by mouth 2 (two) times daily.  . folic acid (FOLVITE) 086 MCG tablet Take 400 mcg by mouth daily.  [name] gabapentin (NEURONTIN) 300 MG capsule Take 1 capsule by mouth daily.  [name] glyBURIDE (DIABETA) 5 MG tablet Take 5 mg by mouth every evening.  . metFORMIN (GLUCOPHAGE) 1000 MG tablet Take 1,000 mg by mouth 2 (two) times daily with a meal.  . metoprolol succinate (TOPROL-XL) 50 MG 24 hr tablet TAKE 1 TABLET DAILY WITH OR IMMEDIATELY FOLLOWING A MEAL  . Multiple Vitamin (MULITIVITAMIN WITH MINERALS) TABS Take 1 tablet by mouth every evening.  [name]  omega-3 acid ethyl esters (LOVAZA) 1 g capsule Take 2 capsules (2 g total) by mouth 2 (two) times daily.  . pantoprazole (PROTONIX) 40 MG tablet Take 40 mg by mouth daily.  . pioglitazone (ACTOS) 30 MG tablet Take 30 mg by mouth every morning.  . potassium gluconate 595 MG TABS tablet Take 90 mg by mouth daily.   . rosuvastatin (CRESTOR) 20 MG tablet Take 20 mg by mouth every evening.  . Tamsulosin HCl (FLOMAX) 0.4 MG CAPS Take 0.8 mg by mouth every evening.   . testosterone cypionate (DEPOTESTOTERONE CYPIONATE) 100 MG/ML injection Inject 150 mg into the muscle every 14 (fourteen) days. For IM use only  . valsartan (DIOVAN) 160 MG tablet Take 80 mg by mouth every morning.  . vardenafil (LEVITRA) 20 MG tablet Take 20  mg by mouth daily as needed. For E.D.  . vitamin C (ASCORBIC ACID) 500 MG tablet Take 1,000 mg by mouth every morning.  . warfarin (COUMADIN) 5 MG tablet Take 5 mg by mouth every evening. As directed     Allergies:   Doxycycline and Penicillins   Social History   Socioeconomic History  . Marital status: Married    Spouse name: Not on file  . Number of children: Not on file  . Years of education: Not on file  . Highest education level: Not on file  Occupational History  . Not on file  Tobacco Use  . Smoking status: Never Smoker  . Smokeless tobacco: Never Used  Substance and Sexual Activity  . Alcohol use: No  . Drug use: No  . Sexual activity: Not on file  Other Topics Concern  . Not on file  Social History Narrative  . Not on file   Social Determinants of Health   Financial Resource Strain:   . Difficulty of Paying Living Expenses:   Food Insecurity:   . Worried About Charity fundraiser in the Last Year:   . Arboriculturist in the Last Year:   Transportation Needs:   . Film/video editor (Medical):   [name] Lack of Transportation (Non-Medical):   Physical Activity:   . Days of Exercise per Week:   . Minutes of Exercise per Session:   Stress:   . Feeling of Stress :   Social Connections:   . Frequency of Communication with Friends and Family:   . Frequency of Social Gatherings with Friends and Family:   . Attends Religious Services:   . Active Member of Clubs or Organizations:   . Attends Archivist Meetings:   [name] Marital Status:       ROS:   Please see the history of present illness.     All other systems reviewed and are negative.  EKGs/Labs/Other Studies Reviewed:    The following studies were reviewed today:  ECHO 02/27/15 - Left ventricle: The cavity size was mildly dilated. Wall thickness was normal. Systolic function was normal. The estimated ejection fraction was in the range of 55% to 60%. Wall motion was normal; there were no  regional wall motion abnormalities.   EKG:  EKG is  ordered today.  The ekg ordered today demonstrates sinus bradycardia 52 no other abnormalities  Recent Labs: No results found for requested labs within last 8760 hours.  Recent Lipid Panel No results found for: CHOL, TRIG, HDL, CHOLHDL, VLDL, LDLCALC, LDLDIRECT  Physical Exam:    VS:  BP 104/60   Pulse (!) 52   Ht 5\' 10"  (1.778 m)  Wt 232 lb 9.6 oz (105.5 kg)   SpO2 95%   BMI 33.37 kg/m     Wt Readings from Last 3 Encounters:  03/05/20 232 lb 9.6 oz (105.5 kg)  02/28/19 217 lb (98.4 kg)  01/30/19 220 lb (99.8 kg)     GEN:  Well nourished, well developed in no acute distress HEENT: Normal NECK: No JVD; No carotid bruits LYMPHATICS: No lymphadenopathy CARDIAC: RRR, no murmurs, rubs, gallops RESPIRATORY:  Clear to auscultation without rales, wheezing or rhonchi  ABDOMEN: Soft, non-tender, non-distended MUSCULOSKELETAL:  No edema; No deformity  SKIN: Warm and dry NEUROLOGIC:  Alert and oriented x 3 PSYCHIATRIC:  Normal affect   ASSESSMENT:    1. Coronary artery disease involving native coronary artery of native heart without angina pectoris   2. Angina decubitus (HCC)   3. Carotid stenosis, bilateral   4. Hypercholesteremia    PLAN:    In order of problems listed above:  Coronary artery disease status post CABG - Patent LIMA to LAD, occluded diagonal and RCA vein graft.  Normal EF.  No anginal symptoms.  Used to be on Ranexa at one point but now is off.  Be doing quite well with aggressive secondary prevention.  Angina -Currently well controlled with metoprolol and amlodipine.  No need for further Ranexa.  Continue with diet and exercise.  Essential hypertension - Currently fairly well controlled on average.  Medications reviewed.  No changes made.  Mixed hyperlipidemia - Fibrate, fish oil (Lovaza), Crestor 20.  LDL goal less than 70.  Last LDL from outside labs was 57, HDL 34 hemoglobin A1c 6.5 hemoglobin  17.5 creatinine 1.8  Chronic anticoagulation - Coumadin for prior PE. Coumadin is checked at Dr. Inda Merlin.   No bleeding issues.  Carotid artery disease - Dr. Scot Dock.  Note, has had prior lymph node resection and neck which could possibly complicate carotid endarterectomy if this is needed. 2019 carotid study: Right Carotid: Velocities in the right ICA are consistent with a 1-39%  stenosis.         Non-hemodynamically significant plaque <50% noted in the  CCA.   Left Carotid: Velocities in the left ICA are consistent with a 40-59%  stenosis.        Non-hemodynamically significant plaque noted in the CCA.   Vertebrals: Bilateral vertebral arteries demonstrate antegrade flow.  Subclavians: Normal flow hemodynamics were seen in bilateral subclavian        arteries.   He states he will give his office a call to get another appointment set up.   Medication Adjustments/Labs and Tests Ordered: Current medicines are reviewed at length with the patient today.  Concerns regarding medicines are outlined above.  Orders Placed This Encounter  Procedures  . EKG 12-Lead   No orders of the defined types were placed in this encounter.   Patient Instructions  Medication Instructions:  The current medical regimen is effective;  continue present plan and medications.  *If you need a refill on your cardiac medications before your next appointment, please call your pharmacy*  Follow-Up: At [facility], you and your health needs are our priority.  As part of our continuing mission to provide you with exceptional heart care, we have created designated Provider Care Teams.  These Care Teams include your primary Cardiologist (physician) and Advanced Practice Providers (APPs -  Physician Assistants and Nurse Practitioners) who all work together to provide you with the care you need, when you need it.  We recommend signing up for the  patient portal called "MyChart".  Sign up  information is provided on this After Visit Summary.  MyChart is used to connect with patients for Virtual Visits (Telemedicine).  Patients are able to view lab/test results, encounter notes, upcoming appointments, etc.  Non-urgent messages can be sent to your provider as well.   To learn more about what you can do with MyChart, go to [email or web address].    Your next appointment:   12 month(s)  The format for your next appointment:   In Person  Provider:   Candee Furbish, MD   Thank you for choosing [name]!!         Signed, Evan Furbish, MD  03/05/2020 9:24 AM    [place]

## 2020-03-16 ENCOUNTER — Other Ambulatory Visit: Payer: Self-pay | Admitting: Cardiology

## 2020-03-16 MED ORDER — FENOFIBRATE MICRONIZED 67 MG PO CAPS: 67.0000 mg | ORAL_CAPSULE | Freq: Every day | ORAL | 3 refills | Status: DC

## 2020-03-19 DIAGNOSIS — E291 Testicular hypofunction: Secondary | ICD-10-CM | POA: Diagnosis not present

## 2020-03-26 ENCOUNTER — Other Ambulatory Visit: Payer: Self-pay | Admitting: Cardiology

## 2020-04-16 DIAGNOSIS — E1121 Type 2 diabetes mellitus with diabetic nephropathy: Secondary | ICD-10-CM | POA: Diagnosis not present

## 2020-04-16 DIAGNOSIS — F331 Major depressive disorder, recurrent, moderate: Secondary | ICD-10-CM | POA: Diagnosis not present

## 2020-04-16 DIAGNOSIS — Z7984 Long term (current) use of oral hypoglycemic drugs: Secondary | ICD-10-CM | POA: Diagnosis not present

## 2020-04-16 DIAGNOSIS — Z86711 Personal history of pulmonary embolism: Secondary | ICD-10-CM | POA: Diagnosis not present

## 2020-04-16 DIAGNOSIS — G4761 Periodic limb movement disorder: Secondary | ICD-10-CM | POA: Diagnosis not present

## 2020-04-16 DIAGNOSIS — E559 Vitamin D deficiency, unspecified: Secondary | ICD-10-CM | POA: Diagnosis not present

## 2020-04-16 DIAGNOSIS — N401 Enlarged prostate with lower urinary tract symptoms: Secondary | ICD-10-CM | POA: Diagnosis not present

## 2020-04-16 DIAGNOSIS — G4733 Obstructive sleep apnea (adult) (pediatric): Secondary | ICD-10-CM | POA: Diagnosis not present

## 2020-04-16 DIAGNOSIS — I10 Essential (primary) hypertension: Secondary | ICD-10-CM | POA: Diagnosis not present

## 2020-04-16 DIAGNOSIS — E1142 Type 2 diabetes mellitus with diabetic polyneuropathy: Secondary | ICD-10-CM | POA: Diagnosis not present

## 2020-04-16 DIAGNOSIS — E291 Testicular hypofunction: Secondary | ICD-10-CM | POA: Diagnosis not present

## 2020-04-16 DIAGNOSIS — Z79899 Other long term (current) drug therapy: Secondary | ICD-10-CM | POA: Diagnosis not present

## 2020-04-30 ENCOUNTER — Other Ambulatory Visit: Payer: Self-pay | Admitting: Cardiology

## 2020-04-30 DIAGNOSIS — N529 Male erectile dysfunction, unspecified: Secondary | ICD-10-CM | POA: Diagnosis not present

## 2020-05-08 DIAGNOSIS — Z08 Encounter for follow-up examination after completed treatment for malignant neoplasm: Secondary | ICD-10-CM | POA: Diagnosis not present

## 2020-05-08 DIAGNOSIS — C4372 Malignant melanoma of left lower limb, including hip: Secondary | ICD-10-CM | POA: Diagnosis not present

## 2020-05-08 DIAGNOSIS — Z85820 Personal history of malignant melanoma of skin: Secondary | ICD-10-CM | POA: Diagnosis not present

## 2020-05-08 DIAGNOSIS — C4331 Malignant melanoma of nose: Secondary | ICD-10-CM | POA: Diagnosis not present

## 2020-05-14 DIAGNOSIS — E291 Testicular hypofunction: Secondary | ICD-10-CM | POA: Diagnosis not present

## 2020-05-18 DIAGNOSIS — M13842 Other specified arthritis, left hand: Secondary | ICD-10-CM | POA: Diagnosis not present

## 2020-06-11 ENCOUNTER — Other Ambulatory Visit: Payer: Self-pay | Admitting: Cardiology

## 2020-06-15 DIAGNOSIS — M13842 Other specified arthritis, left hand: Secondary | ICD-10-CM | POA: Diagnosis not present

## 2020-06-18 DIAGNOSIS — E291 Testicular hypofunction: Secondary | ICD-10-CM | POA: Diagnosis not present

## 2020-06-18 DIAGNOSIS — Z23 Encounter for immunization: Secondary | ICD-10-CM | POA: Diagnosis not present

## 2020-06-18 DIAGNOSIS — Z7901 Long term (current) use of anticoagulants: Secondary | ICD-10-CM | POA: Diagnosis not present

## 2020-06-19 ENCOUNTER — Telehealth: Payer: Self-pay

## 2020-06-19 NOTE — Telephone Encounter (Signed)
**Note De-Identified Shakti Fleer Obfuscation** I started a Omega-3 Acid Ethyl Esters PA trough covermymeds: Key: [id number]

## 2020-06-26 NOTE — Telephone Encounter (Signed)
**Note De-Identified [name] Obfuscation** Following message received from covermymeds:  Evan [name] Key: [id number] - PA Case ID: [id number] Outcome Approved on September 21  Case Id: [id number] [place];Coverage Start Date:05/20/2020;Coverage End Date:06/19/2023;  Drug Omega-3-acid Ethyl Esters 1GM capsules  Form [employer] Electronic PA Form [phone] NCPDP)

## 2020-07-02 DIAGNOSIS — Z7901 Long term (current) use of anticoagulants: Secondary | ICD-10-CM | POA: Diagnosis not present

## 2020-07-02 DIAGNOSIS — E291 Testicular hypofunction: Secondary | ICD-10-CM | POA: Diagnosis not present

## 2020-07-04 ENCOUNTER — Other Ambulatory Visit: Payer: 59

## 2020-07-10 DIAGNOSIS — G4733 Obstructive sleep apnea (adult) (pediatric): Secondary | ICD-10-CM | POA: Diagnosis not present

## 2020-07-16 DIAGNOSIS — E291 Testicular hypofunction: Secondary | ICD-10-CM | POA: Diagnosis not present

## 2020-07-16 DIAGNOSIS — Z7901 Long term (current) use of anticoagulants: Secondary | ICD-10-CM | POA: Diagnosis not present

## 2020-07-30 DIAGNOSIS — H2512 Age-related nuclear cataract, left eye: Secondary | ICD-10-CM | POA: Diagnosis not present

## 2020-08-20 DIAGNOSIS — H2511 Age-related nuclear cataract, right eye: Secondary | ICD-10-CM | POA: Diagnosis not present

## 2020-08-27 DIAGNOSIS — L821 Other seborrheic keratosis: Secondary | ICD-10-CM | POA: Diagnosis not present

## 2020-08-27 DIAGNOSIS — L814 Other melanin hyperpigmentation: Secondary | ICD-10-CM | POA: Diagnosis not present

## 2020-08-27 DIAGNOSIS — L905 Scar conditions and fibrosis of skin: Secondary | ICD-10-CM | POA: Diagnosis not present

## 2020-08-27 DIAGNOSIS — D1801 Hemangioma of skin and subcutaneous tissue: Secondary | ICD-10-CM | POA: Diagnosis not present

## 2020-08-27 DIAGNOSIS — Z85828 Personal history of other malignant neoplasm of skin: Secondary | ICD-10-CM | POA: Diagnosis not present

## 2020-08-27 DIAGNOSIS — L579 Skin changes due to chronic exposure to nonionizing radiation, unspecified: Secondary | ICD-10-CM | POA: Diagnosis not present

## 2020-08-27 DIAGNOSIS — L570 Actinic keratosis: Secondary | ICD-10-CM | POA: Diagnosis not present

## 2020-08-27 DIAGNOSIS — Z85820 Personal history of malignant melanoma of skin: Secondary | ICD-10-CM | POA: Diagnosis not present

## 2020-08-27 DIAGNOSIS — D225 Melanocytic nevi of trunk: Secondary | ICD-10-CM | POA: Diagnosis not present

## 2020-08-27 DIAGNOSIS — D485 Neoplasm of uncertain behavior of skin: Secondary | ICD-10-CM | POA: Diagnosis not present

## 2020-08-29 DIAGNOSIS — G4733 Obstructive sleep apnea (adult) (pediatric): Secondary | ICD-10-CM | POA: Diagnosis not present

## 2020-09-10 DIAGNOSIS — Z7901 Long term (current) use of anticoagulants: Secondary | ICD-10-CM | POA: Diagnosis not present

## 2020-09-10 DIAGNOSIS — E291 Testicular hypofunction: Secondary | ICD-10-CM | POA: Diagnosis not present

## 2020-09-24 DIAGNOSIS — E291 Testicular hypofunction: Secondary | ICD-10-CM | POA: Diagnosis not present

## 2020-10-08 DIAGNOSIS — Z7901 Long term (current) use of anticoagulants: Secondary | ICD-10-CM | POA: Diagnosis not present

## 2020-10-08 DIAGNOSIS — E291 Testicular hypofunction: Secondary | ICD-10-CM | POA: Diagnosis not present

## 2020-10-22 DIAGNOSIS — L111 Transient acantholytic dermatosis [Grover]: Secondary | ICD-10-CM | POA: Diagnosis not present

## 2020-10-22 DIAGNOSIS — L814 Other melanin hyperpigmentation: Secondary | ICD-10-CM | POA: Diagnosis not present

## 2020-10-22 DIAGNOSIS — L570 Actinic keratosis: Secondary | ICD-10-CM | POA: Diagnosis not present

## 2020-10-22 DIAGNOSIS — L218 Other seborrheic dermatitis: Secondary | ICD-10-CM | POA: Diagnosis not present

## 2020-10-22 DIAGNOSIS — L821 Other seborrheic keratosis: Secondary | ICD-10-CM | POA: Diagnosis not present

## 2020-11-05 DIAGNOSIS — E1165 Type 2 diabetes mellitus with hyperglycemia: Secondary | ICD-10-CM | POA: Diagnosis not present

## 2020-11-05 DIAGNOSIS — E291 Testicular hypofunction: Secondary | ICD-10-CM | POA: Diagnosis not present

## 2020-11-05 DIAGNOSIS — Z7901 Long term (current) use of anticoagulants: Secondary | ICD-10-CM | POA: Diagnosis not present

## 2020-12-03 DIAGNOSIS — Z7901 Long term (current) use of anticoagulants: Secondary | ICD-10-CM | POA: Diagnosis not present

## 2020-12-14 DIAGNOSIS — Z7901 Long term (current) use of anticoagulants: Secondary | ICD-10-CM | POA: Diagnosis not present

## 2020-12-14 DIAGNOSIS — E559 Vitamin D deficiency, unspecified: Secondary | ICD-10-CM | POA: Diagnosis not present

## 2020-12-14 DIAGNOSIS — Z7984 Long term (current) use of oral hypoglycemic drugs: Secondary | ICD-10-CM | POA: Diagnosis not present

## 2020-12-14 DIAGNOSIS — Z0000 Encounter for general adult medical examination without abnormal findings: Secondary | ICD-10-CM | POA: Diagnosis not present

## 2020-12-14 DIAGNOSIS — E1142 Type 2 diabetes mellitus with diabetic polyneuropathy: Secondary | ICD-10-CM | POA: Diagnosis not present

## 2020-12-14 DIAGNOSIS — N401 Enlarged prostate with lower urinary tract symptoms: Secondary | ICD-10-CM | POA: Diagnosis not present

## 2020-12-14 DIAGNOSIS — Z79899 Other long term (current) drug therapy: Secondary | ICD-10-CM | POA: Diagnosis not present

## 2020-12-14 DIAGNOSIS — Z13220 Encounter for screening for lipoid disorders: Secondary | ICD-10-CM | POA: Diagnosis not present

## 2020-12-14 DIAGNOSIS — E1121 Type 2 diabetes mellitus with diabetic nephropathy: Secondary | ICD-10-CM | POA: Diagnosis not present

## 2020-12-14 DIAGNOSIS — I25118 Atherosclerotic heart disease of native coronary artery with other forms of angina pectoris: Secondary | ICD-10-CM | POA: Diagnosis not present

## 2020-12-14 DIAGNOSIS — Z86711 Personal history of pulmonary embolism: Secondary | ICD-10-CM | POA: Diagnosis not present

## 2020-12-14 DIAGNOSIS — E291 Testicular hypofunction: Secondary | ICD-10-CM | POA: Diagnosis not present

## 2020-12-17 DIAGNOSIS — E291 Testicular hypofunction: Secondary | ICD-10-CM | POA: Diagnosis not present

## 2020-12-20 DIAGNOSIS — R3915 Urgency of urination: Secondary | ICD-10-CM | POA: Diagnosis not present

## 2020-12-20 DIAGNOSIS — N401 Enlarged prostate with lower urinary tract symptoms: Secondary | ICD-10-CM | POA: Diagnosis not present

## 2020-12-20 DIAGNOSIS — R3916 Straining to void: Secondary | ICD-10-CM | POA: Diagnosis not present

## 2020-12-20 DIAGNOSIS — R3914 Feeling of incomplete bladder emptying: Secondary | ICD-10-CM | POA: Diagnosis not present

## 2020-12-31 DIAGNOSIS — E291 Testicular hypofunction: Secondary | ICD-10-CM | POA: Diagnosis not present

## 2020-12-31 DIAGNOSIS — Z7901 Long term (current) use of anticoagulants: Secondary | ICD-10-CM | POA: Diagnosis not present

## 2021-01-21 DIAGNOSIS — R351 Nocturia: Secondary | ICD-10-CM | POA: Diagnosis not present

## 2021-01-21 DIAGNOSIS — R3914 Feeling of incomplete bladder emptying: Secondary | ICD-10-CM | POA: Diagnosis not present

## 2021-01-21 DIAGNOSIS — N390 Urinary tract infection, site not specified: Secondary | ICD-10-CM | POA: Diagnosis not present

## 2021-01-21 DIAGNOSIS — N401 Enlarged prostate with lower urinary tract symptoms: Secondary | ICD-10-CM | POA: Diagnosis not present

## 2021-01-21 DIAGNOSIS — B952 Enterococcus as the cause of diseases classified elsewhere: Secondary | ICD-10-CM | POA: Diagnosis not present

## 2021-01-28 DIAGNOSIS — L820 Inflamed seborrheic keratosis: Secondary | ICD-10-CM | POA: Diagnosis not present

## 2021-01-28 DIAGNOSIS — E291 Testicular hypofunction: Secondary | ICD-10-CM | POA: Diagnosis not present

## 2021-01-28 DIAGNOSIS — L570 Actinic keratosis: Secondary | ICD-10-CM | POA: Diagnosis not present

## 2021-01-28 DIAGNOSIS — Z7901 Long term (current) use of anticoagulants: Secondary | ICD-10-CM | POA: Diagnosis not present

## 2021-01-28 DIAGNOSIS — L578 Other skin changes due to chronic exposure to nonionizing radiation: Secondary | ICD-10-CM | POA: Diagnosis not present

## 2021-01-30 DIAGNOSIS — Z23 Encounter for immunization: Secondary | ICD-10-CM | POA: Diagnosis not present

## 2021-02-07 DIAGNOSIS — R3912 Poor urinary stream: Secondary | ICD-10-CM | POA: Diagnosis not present

## 2021-02-07 DIAGNOSIS — N401 Enlarged prostate with lower urinary tract symptoms: Secondary | ICD-10-CM | POA: Diagnosis not present

## 2021-02-07 DIAGNOSIS — R3914 Feeling of incomplete bladder emptying: Secondary | ICD-10-CM | POA: Diagnosis not present

## 2021-02-08 ENCOUNTER — Telehealth: Payer: Self-pay | Admitting: Urology

## 2021-02-08 NOTE — Telephone Encounter (Signed)
DONE

## 2021-02-27 ENCOUNTER — Ambulatory Visit (INDEPENDENT_AMBULATORY_CARE_PROVIDER_SITE_OTHER): Payer: POS | Admitting: Urology

## 2021-02-27 ENCOUNTER — Other Ambulatory Visit: Payer: Self-pay

## 2021-02-27 ENCOUNTER — Encounter: Payer: Self-pay | Admitting: Urology

## 2021-02-27 VITALS — BP 110/80 | HR 65 | Ht 70.0 in | Wt 221.5 lb

## 2021-02-27 DIAGNOSIS — R351 Nocturia: Secondary | ICD-10-CM | POA: Diagnosis not present

## 2021-02-27 DIAGNOSIS — N401 Enlarged prostate with lower urinary tract symptoms: Secondary | ICD-10-CM

## 2021-02-27 DIAGNOSIS — R3914 Feeling of incomplete bladder emptying: Secondary | ICD-10-CM | POA: Diagnosis not present

## 2021-02-27 NOTE — Patient Instructions (Signed)

## 2021-02-27 NOTE — Progress Notes (Signed)
02/27/21 4:26 PM   Evan [name] 1945/11/15 [id number]  CC: BPH  HPI: I saw Evan [name] in urology clinic today for evaluation of BPH and to discuss HOLEP.  He was referred over from [facility] urology in [place].  Briefly, he is a comorbid 75 year old male with a long history of urinary symptoms on Flomax and incomplete emptying.  PVR today is elevated at 210 mL.  He has a long history of urinary urgency, frequency, feeling of incomplete emptying, weak stream, and nocturia.  [facility] and was found to have a 136 g prostate.  Urinalysis today is completely benign.  He takes Coumadin for distant history of PE.  Notably he also has significant nocturia and is noncompliant with CPAP.   PMH: Past Medical History:  Diagnosis Date  . [facility] any tx[facility] for [facility].Radiation([facility]) also.  . Cataracts, bilateral   . Chronic renal insufficiency   . Coronary artery disease    '99 -stents, then CABPG-stents failed. Dr. Marlou Porch, cardiologist.  . Depression   . Diabetes mellitus   . Hypercholesteremia   . Hypertension    mark skains  . Kidney stone   . Pulmonary emboli (HCC)    tx. Coumadin-no problems now  . Sleep apnea    no cpap use at this time(not in over a year).    Surgical History: Past Surgical History:  Procedure Laterality Date  . CARDIAC CATHETERIZATION     x3     last 1999  . CHOLECYSTECTOMY     lap. Cholecystectomy about 5-8 yrs ago  . COLONOSCOPY WITH PROPOFOL N/A 09/26/2014   Procedure: COLONOSCOPY WITH PROPOFOL;  Surgeon: [name], MD;  [facility];  Service: Endoscopy;  Laterality: N/A;  . CORONARY ARTERY BYPASS GRAFT     1999  . MASS EXCISION  04/15/2012   Procedure: EXCISION MASS;  Surgeon: [name], MD;  Location: [facility] OR;  Service: ENT;  Laterality: Left;  Left tonsil biopsy  . melanomia     several skin ca removed-nose, left ankle,  parotid gland left, right nodes-being followed annually..   Family History: No family history on file.  Social History:  reports that he has never smoked. He has never used smokeless tobacco. He reports that he does not drink alcohol and does not use drugs.  Physical Exam: BP 110/80 (BP Location: Left Arm, Patient Position: Sitting, Cuff Size: Large)   Pulse 65   Ht 5\' 10"  (1.778 m)   Wt 221 lb 8 oz (100.5 kg)   BMI 31.78 kg/m    Constitutional:  Alert and oriented, No acute distress. Cardiovascular: No clubbing, cyanosis, or edema. Respiratory: Normal respiratory effort, no increased work of breathing. GI: Abdomen is soft, nontender, nondistended, no abdominal masses   Assessment & Plan:   Comorbid 75 year old male with 136 g prostate on transrectal ultrasound and worsening BPH symptoms with incomplete emptying despite Flomax.  His noncompliance with CPAP for sleep apnea is likely contributing to his persistent nocturia as well.  We discussed the risks and benefits of HoLEP at length.  The procedure requires general anesthesia and takes 2 to 3 hours, and a holmium laser is used to enucleate the prostate and push this tissue into the bladder.  A morcellator is then used to remove this tissue, which is sent for pathology.  The vast majority of patients are able to discharge the same day with a catheter  in place for 2 to 3 days, and will follow-up in clinic for a voiding trial.  Approximately 5% of patients will be admitted overnight to monitor the urine, or if they have multiple co-morbidities.  We specifically discussed the risks of bleeding, infection, retrograde ejaculation, temporary urgency and urge incontinence, very low risk of long-term incontinence, pathologic evaluation of prostate tissue and possible detection of prostate cancer or other malignancy, and possible need for additional procedures.  Encouraged to resume CPAP to see if nocturia improves Schedule HOLEP   Nickolas Madrid, MD 02/27/2021  [facility] [address] [place], [place] [address] [phone]

## 2021-03-01 LAB — MICROSCOPIC EXAMINATION: Bacteria, UA: NONE SEEN

## 2021-03-01 LAB — URINALYSIS, COMPLETE
Bilirubin, UA: NEGATIVE
Ketones, UA: NEGATIVE
Leukocytes,UA: NEGATIVE
Nitrite, UA: NEGATIVE
Protein,UA: NEGATIVE
RBC, UA: NEGATIVE
Specific Gravity, UA: 1.025 (ref 1.005–1.030)
Urobilinogen, Ur: 0.2 mg/dL (ref 0.2–1.0)
pH, UA: 5 (ref 5.0–7.5)

## 2021-03-08 ENCOUNTER — Other Ambulatory Visit: Payer: Self-pay | Admitting: Cardiology

## 2021-03-09 ENCOUNTER — Other Ambulatory Visit: Payer: Self-pay | Admitting: Cardiology

## 2021-03-26 ENCOUNTER — Other Ambulatory Visit: Payer: Self-pay

## 2021-03-26 DIAGNOSIS — R3914 Feeling of incomplete bladder emptying: Secondary | ICD-10-CM

## 2021-03-26 DIAGNOSIS — N401 Enlarged prostate with lower urinary tract symptoms: Secondary | ICD-10-CM

## 2021-04-03 ENCOUNTER — Telehealth: Payer: Self-pay | Admitting: *Deleted

## 2021-04-03 NOTE — Telephone Encounter (Signed)
   [facility] Pre-operative Risk Assessment    Patient Name: Evan [name]  DOB: 1946-05-06  MRN: [id number]   HEARTCARE STAFF: - Please ensure there is not already an duplicate clearance open for this procedure. - Under Visit Info/Reason for Call, type in Other and utilize the format Clearance MM/DD/YY or Clearance TBD. Do not use dashes or single digits. - If request is for dental extraction, please clarify the # of teeth to be extracted. - If the patient is currently at the dentist's office, call Pre-Op APP to address. If the patient is not currently in the dentist office, please route to the Pre-Op pool  Request for surgical clearance:  What type of surgery is being performed? HOLMIUM LASER ENUCLEATION OF PROSTATE   When is this surgery scheduled? TBD (8/5, POSSIBLE SURGERY DATES (8/12 OR 8/19)   What type of clearance is required (medical clearance vs. Pharmacy clearance to hold med vs. Both)? BOTH  Are there any medications that need to be held prior to surgery and how long? COUMADIN x 5 DAYS PRIOR TO SURGERY; ASA x 7 DAYS PRIOR TO SURGERY  Practice name and name of physician performing surgery? [facility]; DR. Diamantina Providence    What is the office phone number? [phone]   7.   What is the office fax number? [phone]  8.   Anesthesia type (None, local, MAC, general) ? GENERAL   Julaine Hua 04/03/2021, 5:03 PM  _________________________________________________________________   (provider comments below)

## 2021-04-03 NOTE — Telephone Encounter (Signed)
   Name: JASE REEP  DOB: [birth year]  MRN: [id number]  Primary Cardiologist: Candee Furbish, MD  Chart reviewed as part of pre-operative protocol coverage. Because of ALBERT HERSCH past medical history and time since last visit, he will require a follow-up visit in order to better assess preoperative cardiovascular risk. Last OV was over 1 year ago (02/2020).  Pre-op covering staff: - Please schedule appointment and call patient to inform them. If patient already had an upcoming appointment within acceptable timeframe, please add "pre-op clearance" to the appointment notes so provider is aware. - Please contact requesting surgeon's office via preferred method (i.e, phone, fax) to inform them of need for appointment prior to surgery. - Please contact surgeon to let them know we do not manage this patient's Coumadin, he is on it for history of PE and it is not prescribed or monitored through our office so would recommend they reach out to the provider that manages this  This will also be routed to primary cardiologist for input on holding ASPIRIN as requested below so that this information is available to the clearing provider at time of patient's appointment. Dr. Marlou Porch, please review and advise whether patient is OK to hold ASA x 7 days as long as clinically stable in the follow-up visit. No need to route your reply, it will just be helpful to have when APP or provider sees patient in [place], PA-C  04/03/2021, 5:51 PM

## 2021-04-04 NOTE — Telephone Encounter (Signed)
I s/w the pt and he has been scheduled for a pre op appt to see Laurann Montana, NP at our [address] location 04/22/21 @ 2:30. Will send notes to NP for appt 7/25. Will send FYI to surgeon's office pt has appt 04/22/21.

## 2021-04-04 NOTE — Telephone Encounter (Signed)
MD recommendation on ASA is noted below - this information is now available to reference at patient's appointment. Chart was routed yesterday to callback team to arrange OV for clearance. Will remove from preop APP box.

## 2021-04-04 NOTE — Telephone Encounter (Signed)
Left message for the pt to call back , so that we may schedule an appt sooner for pre op clearance. Pt is scheduled to see his primary cardiologist Dr. Marlou Porch 06/28/21. I was going to offer an appt at our new location [address], as [address] is full.

## 2021-04-19 ENCOUNTER — Other Ambulatory Visit: Payer: Medicare Other

## 2021-04-21 ENCOUNTER — Other Ambulatory Visit: Payer: Self-pay | Admitting: Cardiology

## 2021-04-22 ENCOUNTER — Ambulatory Visit (INDEPENDENT_AMBULATORY_CARE_PROVIDER_SITE_OTHER): Payer: 59 | Admitting: Family

## 2021-04-22 ENCOUNTER — Encounter (HOSPITAL_BASED_OUTPATIENT_CLINIC_OR_DEPARTMENT_OTHER): Payer: Self-pay | Admitting: Family

## 2021-04-22 ENCOUNTER — Other Ambulatory Visit (HOSPITAL_BASED_OUTPATIENT_CLINIC_OR_DEPARTMENT_OTHER): Payer: Self-pay | Admitting: *Deleted

## 2021-04-22 ENCOUNTER — Other Ambulatory Visit: Payer: Self-pay

## 2021-04-22 ENCOUNTER — Inpatient Hospital Stay
Admission: RE | Admit: 2021-04-22 | Discharge: 2021-04-22 | Disposition: A | Discharge status: Home / Self Care | Payer: 59 | Source: Ambulatory Visit

## 2021-04-22 VITALS — BP 86/44 | HR 64 | Ht 70.0 in | Wt 223.0 lb

## 2021-04-22 DIAGNOSIS — I6523 Occlusion and stenosis of bilateral carotid arteries: Secondary | ICD-10-CM

## 2021-04-22 DIAGNOSIS — I2510 Atherosclerotic heart disease of native coronary artery without angina pectoris: Secondary | ICD-10-CM

## 2021-04-22 DIAGNOSIS — I208 Other forms of angina pectoris: Secondary | ICD-10-CM

## 2021-04-22 DIAGNOSIS — E7800 Pure hypercholesterolemia, unspecified: Secondary | ICD-10-CM | POA: Diagnosis not present

## 2021-04-22 DIAGNOSIS — I4892 Unspecified atrial flutter: Secondary | ICD-10-CM

## 2021-04-22 HISTORY — DX: Unspecified atrial flutter: I48.92

## 2021-04-22 NOTE — Patient Instructions (Signed)
Medication Instructions:  Your physician has recommended you make the following change in your medication:   STOP Amlodipine (Norvasc)   *If you need a refill on your cardiac medications before your next appointment, please call your pharmacy*   Lab Work: Your physician recommends that you return for lab work this week for TSH, BMP, CBC  If you have labs (blood work) drawn today and your tests are completely normal, you will receive your results only by: [place] (if you have MyChart) OR A paper copy in the mail If you have any lab test that is abnormal or we need to change your treatment, we will call you to review the results.   Testing/Procedures: Your physician has requested that you have an echocardiogram. Echocardiography is a painless test that uses sound waves to create images of your heart. It provides your doctor with information about the size and shape of your heart and how well your heart's chambers and valves are working. This procedure takes approximately one hour. There are no restrictions for this procedure.    Follow-Up: At [facility], you and your health needs are our priority.  As part of our continuing mission to provide you with exceptional heart care, we have created designated Provider Care Teams.  These Care Teams include your primary Cardiologist (physician) and Advanced Practice Providers (APPs -  Physician Assistants and Nurse Practitioners) who all work together to provide you with the care you need, when you need it.  We recommend signing up for the patient portal called "MyChart".  Sign up information is provided on this After Visit Summary.  MyChart is used to connect with patients for Virtual Visits (Telemedicine).  Patients are able to view lab/test results, encounter notes, upcoming appointments, etc.  Non-urgent messages can be sent to your provider as well.   To learn more about what you can do with MyChart, go to [email or web address].     Your next appointment:   After echocardiogram  The format for your next appointment:   In Person  Provider:   You may see Candee Furbish, MD or one of the following Advanced Practice Providers on your designated Care Team:   Cecilie Kicks, NP Loel Dubonnet, NP    Other Instructions  Atrial Flutter  Atrial flutter is a type of abnormal heart rhythm (arrhythmia). The heart has an electrical system that tells it how to beat. In atrial flutter, the signals move rapidly in the top chambers of the heart (the atria). This makes your heart beat very fast. Atrial flutter can come and go, or itcan be permanent. The goal of treatment is to prevent blood clots from forming, control your heart rate, or restore your heartbeat to a normal rhythm. If this condition is not treated, it can cause serious problems, such as a weakened heart muscle (cardiomyopathy) or a stroke. What are the causes? This condition is often caused by conditions that damage the heart's electrical system. These include: Heart conditions and heart surgery. These include heart attacks and open-heart surgery. Lung problems, such as COPD or a blood clot in the lung (pulmonary embolism, or PE). Poorly controlled high blood pressure (hypertension). Overactive thyroid (hyperthyroidism). Diabetes. In some cases, the cause of this condition is not known. What increases the risk? You are more likely to develop this condition if: You are an elderly adult. You are a man. You are overweight (obese). You have obstructive sleep apnea. You have a family history of atrial flutter. You have diabetes. You  drink a lot of alcohol, especially binge drinking. You use drugs, including cannabis. You smoke. What are the signs or symptoms? Symptoms of this condition include: A feeling that your heart is pounding or racing (palpitations). Shortness of breath. Chest pain. Feeling dizzy or light-headed. Fainting. Low blood pressure  (hypotension). Fatigue. Tiring easily during exercise or activity. In some cases, there are no symptoms. How is this diagnosed? This condition may be diagnosed with: An electrocardiogram (ECG) to check electrical signals of the heart. An ambulatory cardiac monitor to record your heart's activity for a few days. An echocardiogram to create pictures of your heart. A transesophageal echocardiogram (TEE) to create even better pictures of your heart. A stress test to check your blood supply while you exercise. Imaging tests, such as a CT scan or chest X-ray. Blood tests. How is this treated? Treatment depends on underlying conditions and how you feel when you experience atrial flutter. This condition may be treated with: Medicines to prevent blood clots or to treat heart rate or heart rhythm problems. Electrical cardioversion to reset the heart's rhythm. Ablation to remove the heart tissue that sends abnormal signals. Left atrial appendage closure to seal the area where blood clots can form. In some cases, underlying conditions will be treated. Follow these instructions at home: Medicines Take over-the-counter and prescription medicines only as told by your health care provider. Do not take any new medicines without talking to your health care provider. If you are taking blood thinners: Talk with your health care provider before you take any medicines that contain aspirin or NSAIDs, such as ibuprofen. These medicines increase your risk for dangerous bleeding. Take your medicine exactly as told, at the same time every day. Avoid activities that could cause injury or bruising, and follow instructions about how to prevent falls. Wear a medical alert bracelet or carry a card that lists what medicines you take. Lifestyle Eat heart-healthy foods. Talk with a dietitian to make an eating plan that is right for you. Do not use any products that contain nicotine or tobacco, such as cigarettes,  e-cigarettes, and chewing tobacco. If you need help quitting, ask your health care provider. Do not drink alcohol. Do not use drugs, including cannabis. Lose weight if you are overweight or obese. Exercise regularly as instructed by your health care provider. General instructions Do not use diet pills unless your health care provider approves. Diet pills may make heart problems worse. If you have obstructive sleep apnea, manage your condition as told by your health care provider. Keep all follow-up visits as told by your health care provider. This is important. Contact a health care provider if you: Notice a change in the rate, rhythm, or strength of your heartbeat. Are taking a blood thinner and you notice more bruising. Have a sudden change in weight. Tire more easily when you exercise or do heavy work. Get help right away if you have: Pain or pressure in your chest. Shortness of breath. Fainting. Increasing sweating with no known cause. Side effects of blood thinners, such as blood in your vomit, stool, or urine, or bleeding that cannot stop. Any symptoms of a stroke. "BE FAST" is an easy way to remember the main warning signs of a stroke: B - Balance. Signs are dizziness, sudden trouble walking, or loss of balance. E - Eyes. Signs are trouble seeing or a sudden change in vision. F - Face. Signs are sudden weakness or numbness of the face, or the face or eyelid drooping on  one side. A - Arms. Signs are weakness or numbness in an arm. This happens suddenly and usually on one side of the body. S - Speech. Signs are sudden trouble speaking, slurred speech, or trouble understanding what people say. T - Time. Time to call emergency services. Write down what time symptoms started. Other signs of a stroke, such as: A sudden, severe headache with no known cause. Nausea or vomiting. Seizure. These symptoms may represent a serious problem that is an emergency. Do not wait to see if the  symptoms will go away. Get medical help right away. Call your local emergency services (911 in the [place] Do not drive yourself to the hospital. Summary Atrial flutter is an abnormal heart rhythm that can give you symptoms of palpitations, shortness of breath, or fatigue. Atrial flutter is often treated with medicines to keep your heart in a normal rhythm and to prevent a stroke. Get help right away if you cannot catch your breath, or have chest pain or pressure. Get help right away if you have signs or symptoms of a stroke. This information is not intended to replace advice given to you by your health care provider. Make sure you discuss any questions you have with your healthcare provider. Document Revised: 03/09/2019 Document Reviewed: 03/09/2019 Elsevier Patient Education  [place].

## 2021-04-22 NOTE — Patient Instructions (Addendum)
Your procedure is scheduled on: 05/03/2021  Report to the Registration Desk on the 1st floor of the [facility]. To find out your arrival time, please call [phone] between 1PM - 3PM on: 05/02/2021  REMEMBER: Instructions that are not followed completely may result in serious medical risk, up to and including death; or upon the discretion of your surgeon and anesthesiologist your surgery may need to be rescheduled.  Do not eat food or drink any liquid after midnight the night before surgery.  No gum chewing, lozengers or hard candies.    TAKE THESE MEDICATIONS THE MORNING OF SURGERY WITH A SIP OF WATER: Amlodipine (norvasc) citalopram (CELEXA)   gabapentin (NEURONTIN) fenofibrate micronized   pantoprazole (PROTONIX) (take one the night before and one on the morning of surgery - helps to prevent nausea after surgery.)                  Do not take medications not on the list above     Stop Metformin 2 days prior to surgery. Last dose will be  April 30, 2021  Hold  glyBURIDE (DIABETA) on day of surgery to avoid low blood sugar  Hold  pioglitazone (ACTOS)   on day of Surgery to avoid low blood sugar   Follow recommendations from Cardiologist regarding stopping Aspirin, Coumadin.   One week prior to surgery: Stop Anti-inflammatories (NSAIDS) such as Advil, Aleve, Ibuprofen, Motrin, Naproxen, Naprosyn and Aspirin based products such as Excedrin, Goodys Powder, BC Powder.  Stop ANY OVER THE COUNTER supplements until after surgery like ANUCORT-HC 25, cholecalciferol, (VITAMIN D), co-enzyme Q-10, Cranberry, FLAXSEED, LINSEED, folic acid,Multiple Vitamin,omega-3 acid ,omega-3 acid, vitamin C  You may however, continue to take Tylenol if needed for pain up until the day of surgery.  No Alcohol for 24 hours before or after surgery.  No Smoking including e-cigarettes for 24 hours prior to surgery.  No chewable tobacco products for at least 6 hours prior to surgery.  No nicotine  patches on the day of surgery.  Do not use any "recreational" drugs for at least a week prior to your surgery.  Please be advised that the combination of cocaine and anesthesia may have negative outcomes, up to and including death. If you test positive for cocaine, your surgery will be cancelled.  On the morning of surgery brush your teeth with toothpaste and water, you may rinse your mouth with mouthwash if you wish. Do not swallow any toothpaste or mouthwash.  Do not wear jewelry on day of surgery.  Do not wear lotions, powders, or perfumes.   Do not shave body from the neck down 48 hours prior to surgery just in case you cut yourself which could leave a site for infection.  Also, freshly shaved skin may become irritated if using the CHG soap.  Contact lenses, hearing aids and dentures may not be worn into surgery.  Do not bring valuables to the hospital. [facility] is not responsible for any missing/lost belongings or valuables.   Use CHG Soap or wipes as directed on instruction sheet.  Bring your C-PAP to the hospital with you in case you may have to spend the night.   Notify your doctor if there is any change in your medical condition (cold, fever, infection).  Wear comfortable clothing (specific to your surgery type) to the hospital.  After surgery, you can help prevent lung complications by doing breathing exercises.  Take deep breaths and cough every 1-2 hours. Your doctor may order a  device called an Incentive Spirometer to help you take deep breaths.  If you are being admitted to the hospital overnight, leave your suitcase in the car. After surgery it may be brought to your room.  If you are being discharged the day of surgery, you will not be allowed to drive home. You will need a responsible adult (18 years or older) to drive you home and stay with you that night.   If you are taking public transportation, you will need to have a responsible adult (18 years or older)  with you. Please confirm with your physician that it is acceptable to use public transportation.   Please call the [facility] at [phone] if you have any questions about these instructions.  Surgery Visitation Policy:  Patients undergoing a surgery or procedure may have one family member or support person with them as long as that person is not COVID-19 positive or experiencing its symptoms.  That person may remain in the waiting area during the procedure.  Inpatient Visitation:    Visiting hours are 7 a.m. to 8 p.m. Inpatients will be allowed two visitors daily. The visitors may change each day during the patient's stay. No visitors under the age of 66. Any visitor under the age of 71 must be accompanied by an adult. The visitor must pass COVID-19 screenings, use hand sanitizer when entering and exiting the patient's room and wear a mask at all times, including in the patient's room. Patients must also wear a mask when staff or their visitor are in the room. Masking is required regardless of vaccination status.

## 2021-04-22 NOTE — Progress Notes (Signed)
Office Visit    Patient Name: Evan [name] Date of Encounter: 04/22/2021  PCP:  Josetta Huddle, MD   [place]  Cardiologist:  Candee Furbish, MD  Advanced Practice Provider:  No care team member to display Electrophysiologist:  None   Chief Complaint    Evan [name] is a 75 y.o. male with a hx of  CAD s/p CABG, PE on chronic anticoagulation, atrial flutter, DM2, HTN presents today for preop clearance   Past Medical History    Past Medical History:  Diagnosis Date   Cancer [facility])    chemo- [facility]- 10 yrs since any tx. for melanoma.Radiation([facility]) also.   Cataracts, bilateral    Chronic renal insufficiency    Coronary artery disease    '99 -stents, then CABPG-stents failed. Dr. Marlou Porch, cardiologist.   Depression    Diabetes mellitus    Hypercholesteremia    Hypertension    [facility]   Kidney stone    Pulmonary emboli ([facility])    tx. Coumadin-no problems now   Sleep apnea    no cpap use at this time(not in over a year).   Past Surgical History:  Procedure Laterality Date   CARDIAC CATHETERIZATION     x3     last 1999   CHOLECYSTECTOMY     lap. Cholecystectomy about 5-8 yrs ago   COLONOSCOPY WITH PROPOFOL N/A 09/26/2014   Procedure: COLONOSCOPY WITH PROPOFOL;  Surgeon: [name], MD;  Location: [facility];  Service: Endoscopy;  Laterality: N/A;   CORONARY ARTERY BYPASS GRAFT     1999   MASS EXCISION  04/15/2012   Procedure: EXCISION MASS;  Surgeon: [name], MD;  Location: [facility] OR;  Service: ENT;  Laterality: Left;  Left tonsil biopsy   melanomia     several skin ca removed-nose, left ankle, parotid gland left, right nodes-being followed annually..    Allergies  Allergies  Allergen Reactions   Doxycycline Other (See Comments)    BPH can't void when taking    Penicillins Rash and Itching    History of Present Illness    Evan [name] is a 75 y.o. male with a hx of CAD s/p CABG, PE on chronic  anticoagulation, atrial flutter, DM2, HTN last seen 03/05/20 by Dr. Marlou Porch.  He had cardiac catheterization showing occluded diagonal graft, occluded right coronary artery graft, and patent LIMA to LAD. Stress test 2010 with no evidence of ischemia. He had ches tpain while on Ranexa and it was discontinued.   He was last seen 02/2020 doing well from a cardiac perspective. No changes were made at that time.   He presents today for follow up and preoperative clearance for laser enucleation of the prostate. Continues to work part time at [employer] which he enjoys. Tells me he quit golfing a couple years ago and has no formal exercise routine. He has been feeling "swimmy headed" and lightheaded. Does not check his blood pressure at home and very hypotensive today. Reports eating regular meals and driving adequate water. No near syncope nor syncope. No chest pain, pressure, tightness. Notes occasional palpitations.   EKGs/Labs/Other Studies Reviewed:   The following studies were reviewed today:   EKG:  EKG is ordered today.  The ekg ordered today demonstrates new onset atrial flutter with 4:1conduction and RBBB.  Recent Labs: No results found for requested labs within last 8760 hours.  Recent Lipid Panel No results found for: CHOL, TRIG, HDL, CHOLHDL, VLDL, LDLCALC, LDLDIRECT  Home Medications   Current Meds  Medication Sig   amLODipine (NORVASC) 5 MG tablet TAKE 1 TABLET DAILY   ANUCORT-HC 25 MG suppository Place 25 mg rectally daily as needed.   Bromfenac Sodium (PROLENSA) 0.07 % SOLN Prolensa 0.07 % eye drops   cholecalciferol (VITAMIN D) 1000 UNITS tablet Take 2,000 Units by mouth every morning.    citalopram (CELEXA) 40 MG tablet Take 40 mg by mouth every morning.   co-enzyme Q-10 50 MG capsule Take 100 mg by mouth every morning.   Cranberry 500 MG CAPS Take 500 mg by mouth daily.   Difluprednate 0.05 % EMUL Durezol 0.05 % eye drops   fenofibrate micronized (LOFIBRA) 67 MG capsule Take  1 capsule (67 mg total) by mouth daily.   FLAXSEED, LINSEED, PO Take 2 tablets by mouth 2 (two) times daily.   fluticasone (CUTIVATE) 0.05 % cream Apply topically 2 (two) times daily as needed.   folic acid (FOLVITE) A999333 MCG tablet Take 400 mcg by mouth daily.   gabapentin (NEURONTIN) 300 MG capsule Take 1 capsule by mouth daily.   glyBURIDE (DIABETA) 5 MG tablet Take 5 mg by mouth every evening.   ketoconazole (NIZORAL) 2 % shampoo ketoconazole 2 % shampoo  WASH SCALP, CHEST, AND EARS 2 TO 3 TIMES A WEEK   metFORMIN (GLUCOPHAGE-XR) 500 MG 24 hr tablet Take 1,000 mg by mouth 2 (two) times daily.   metoprolol succinate (TOPROL-XL) 50 MG 24 hr tablet Take 1 tablet (50 mg total) by mouth daily. With or immediately following a meal. Please keep upcoming appt with Dr. Marlou Porch in September 2022 before anymore refills. Thank you   Multiple Vitamin (MULITIVITAMIN WITH MINERALS) TABS Take 1 tablet by mouth every evening.   nitroGLYCERIN (NITROSTAT) 0.4 MG SL tablet Place under the tongue.   omega-3 acid ethyl esters (LOVAZA) 1 g capsule TAKE 2 CAPSULES TWICE A DAY   pantoprazole (PROTONIX) 40 MG tablet Take 40 mg by mouth daily.   pioglitazone (ACTOS) 30 MG tablet Take 30 mg by mouth every morning.   potassium gluconate 595 MG TABS tablet Take 90 mg by mouth daily.    rosuvastatin (CRESTOR) 20 MG tablet Take 20 mg by mouth every evening.   Tamsulosin HCl (FLOMAX) 0.4 MG CAPS Take 0.8 mg by mouth every evening.    testosterone cypionate (DEPOTESTOTERONE CYPIONATE) 100 MG/ML injection Inject 150 mg into the muscle every 14 (fourteen) days. For IM use only   triamcinolone lotion (KENALOG) 0.1 % triamcinolone acetonide 0.1 % lotion  APPLY TO AFFECTED AREA TWICE A DAY AS NEEDED FOR FLARE/ITCH   valsartan (DIOVAN) 160 MG tablet Take 80 mg by mouth every morning.   vitamin C (ASCORBIC ACID) 500 MG tablet Take 1,000 mg by mouth every morning.   warfarin (COUMADIN) 5 MG tablet Take 5 mg by mouth every evening.  As directed--1/2 tab every Monday, Wednesday, and Friday; 1 tab all other days     Review of Systems      All other systems reviewed and are otherwise negative except as noted above.  Physical Exam    VS:  BP (!) 86/44   Pulse 64   Ht '5\' 10"'$  (1.778 m)   Wt 223 lb (101.2 kg)   BMI 32.00 kg/m  , BMI Body mass index is 32 kg/m.  Wt Readings from Last 3 Encounters:  04/22/21 223 lb (101.2 kg)  02/27/21 221 lb 8 oz (100.5 kg)  03/05/20 232 lb 9.6 oz (105.5 kg)  GEN: Well nourished, well developed, in no acute distress. HEENT: normal. Neck: Supple, no JVD, carotid bruits, or masses. Cardiac: RRR, no murmurs, rubs, or gallops. No clubbing, cyanosis, edema.  Radials/PT 2+ and equal bilaterally.  Respiratory:  Respirations regular and unlabored, clear to auscultation bilaterally. GI: Soft, nontender, nondistended. MS: No deformity or atrophy. Skin: Warm and dry, no rash. Neuro:  Strength and sensation are intact. Psych: Normal affect.  Assessment & Plan    Atrial flutter - New diagnosis today. Does note some occasional palpitations and dizziness. No exertional dyspnea. He is already appropriately anticoagulated due to previous PE. He has a CHADS2VASc of 5 (agex2, HTN, DM2, CAD). Continue Toprol '50mg'$  daily as rate is well controlled. Plan for echocardiogram to assess atria size. Consider cardioversion at follow up.   CAD - Stable with no anginal symptoms. No indication for ischemic evaluation.  GDMT includes Metoprolol, rosuvastatin. No aspirin due to chronic anticoagulation.   Preoperative cardiovascular clearance - He is scheduled for [place] Laser Enucleation of hte prostate with Dr. Diamantina Providence of urology. Holding warfarin prior to his procedure will need to addressed by primary care as they manage. He has no anginal symptoms, but has new finding of atrial flutter in clinic today. As such, will need to workup atrial flutter as above efore proceeding with elective procedure. He is  agreeable with this plan and very understanding. Will route to Dr. Doristine Counter office so they are aware.   HTN - Now with hypotension. Stop Amlodipine. If hypotension persists, plan to reduce Valsartan dose.   HLD - Continue Lovaza, Crestor. LDL at goal of <70.   Chronic anticoagulation due to history of PE - Denies bleeding complications. Continue Warfarin. Managed by primary care.  Carotid artery disease - Follows with Dr. Scot Dock of vascular surgery. Last duplex 2019 with R 1-39% and L 40-58% stenosed. Overdue for follow up with Dr. Scot Dock. If follow up not scheduled at next office visit, consider ordering doppler.   Disposition: Follow up  after echocardiogram  with Dr. Marlou Porch or APP.  Signed, Loel Dubonnet, NP 04/22/2021, 3:02 PM [place]

## 2021-05-01 ENCOUNTER — Other Ambulatory Visit: Payer: Self-pay | Admitting: Cardiology

## 2021-05-06 DIAGNOSIS — Z7901 Long term (current) use of anticoagulants: Secondary | ICD-10-CM | POA: Diagnosis not present

## 2021-05-07 DIAGNOSIS — C4372 Malignant melanoma of left lower limb, including hip: Secondary | ICD-10-CM | POA: Diagnosis not present

## 2021-05-07 DIAGNOSIS — C4331 Malignant melanoma of nose: Secondary | ICD-10-CM | POA: Diagnosis not present

## 2021-05-07 DIAGNOSIS — Z08 Encounter for follow-up examination after completed treatment for malignant neoplasm: Secondary | ICD-10-CM | POA: Diagnosis not present

## 2021-05-07 DIAGNOSIS — Z85820 Personal history of malignant melanoma of skin: Secondary | ICD-10-CM | POA: Diagnosis not present

## 2021-05-07 DIAGNOSIS — Z85828 Personal history of other malignant neoplasm of skin: Secondary | ICD-10-CM | POA: Diagnosis not present

## 2021-05-13 ENCOUNTER — Other Ambulatory Visit (INDEPENDENT_AMBULATORY_CARE_PROVIDER_SITE_OTHER): Payer: 59

## 2021-05-13 ENCOUNTER — Other Ambulatory Visit: Payer: Self-pay

## 2021-05-13 ENCOUNTER — Ambulatory Visit (HOSPITAL_COMMUNITY): Payer: 59 | Attending: Internal Medicine

## 2021-05-13 DIAGNOSIS — I2510 Atherosclerotic heart disease of native coronary artery without angina pectoris: Secondary | ICD-10-CM | POA: Diagnosis not present

## 2021-05-13 DIAGNOSIS — I6523 Occlusion and stenosis of bilateral carotid arteries: Secondary | ICD-10-CM | POA: Diagnosis not present

## 2021-05-13 DIAGNOSIS — I208 Other forms of angina pectoris: Secondary | ICD-10-CM | POA: Insufficient documentation

## 2021-05-13 DIAGNOSIS — E7800 Pure hypercholesterolemia, unspecified: Secondary | ICD-10-CM | POA: Diagnosis not present

## 2021-05-13 DIAGNOSIS — I2089 Other forms of angina pectoris: Secondary | ICD-10-CM

## 2021-05-13 LAB — ECHOCARDIOGRAM COMPLETE
Area-P 1/2: 3.49 cm2
S' Lateral: 3.4 cm

## 2021-05-15 ENCOUNTER — Inpatient Hospital Stay: Admission: RE | Admit: 2021-05-15 | Payer: 59 | Source: Ambulatory Visit

## 2021-05-15 HISTORY — DX: Anxiety disorder, unspecified: F41.9

## 2021-05-15 HISTORY — DX: Gastro-esophageal reflux disease without esophagitis: K21.9

## 2021-05-15 HISTORY — DX: Malignant melanoma of skin, unspecified: C43.9

## 2021-05-15 NOTE — Telephone Encounter (Signed)
Patient seen 04/22/21 for preop clearance. He was in new onset atrial flutter. Anticoagulation was initiated. Echo ordered and performed 05/13/21 with normal LVEF, mild LVH, mild dilation of aorta, LA mild-moderately dilated. He has follow up scheduled 05/31/21 in office for repeat EKG and discussion of possible cardioversion.   Given new cardiac problem, anticoagulation - workup of atrial flutter will need to be completed prior to urologic procedure.   Will route to urology team so they are aware. Patient is agreeable to this plan of care.   Loel Dubonnet, NP

## 2021-05-15 NOTE — Progress Notes (Signed)
Urology surgery scheduled for 05/03/21 was cancelled due to new Atrial flutter. Seen by Dr. Marlou Porch (cardiology) and had echo done on 05/13/21. Has a follow up appointment with cardiology on 05/31/21 for a repeat EKG and possible cardioversion. This work up will need completed prior to urology procedure. Surgery rescheduled for 06/07/2021. Call to patient to inform of new preop interview date of 06/04/21 and also reminded patient to stop his coumadin 5 days prior to his surgery. Last day to take coumadin is Saturday, September 3. Patient acknowledged understanding.

## 2021-05-28 NOTE — Telephone Encounter (Signed)
Our office received a duplicate request for clearance. I s/w the Laurann Montana, NP who recently saw the pt. Per Laurann Montana, NP: He was new atrial flutter so we'll determine whether surgery then cardioversion or cardioversion then surgery on 9/2.  I will update the surgeon's office as to plan of care at this time.   Once the pt has been cleared our office will be sure to fax over clearance.

## 2021-05-30 NOTE — Progress Notes (Signed)
Office Visit    Patient Name: Evan [name] Date of Encounter: 05/31/2021  PCP:  Josetta Huddle, MD   [place]  Cardiologist:  Candee Furbish, MD  Advanced Practice Provider:  No care team member to display Electrophysiologist:  None   Chief Complaint    Evan [name] is a 75 y.o. male with a hx of  CAD s/p CABG, PE on chronic anticoagulation, atrial flutter, DM2, HTN presents today for preop clearance   Past Medical History    Past Medical History:  Diagnosis Date   Anxiety    Atrial flutter ([place])    new onset 04-22-21   [facility] since any tx. for melanoma.Radiation([facility]) also.   Cataracts, bilateral    Chronic renal insufficiency    Coronary artery disease    '99 -stents, then CABPG-stents failed. Dr. Marlou Porch, cardiologist.   Depression    Diabetes mellitus    GERD (gastroesophageal reflux disease)    Hypercholesteremia    Hypertension    mark skains   Kidney stone    Malignant melanoma ([place])    face, nose, left leg   Pulmonary emboli ([facility])    tx. Coumadin-no problems now   Sleep apnea    no cpap use at this time(not in over a year).   Past Surgical History:  Procedure Laterality Date   CARDIAC CATHETERIZATION     x3     last 1999   CHOLECYSTECTOMY     lap. Cholecystectomy about 5-8 yrs ago   COLONOSCOPY WITH PROPOFOL N/A 09/26/2014   Procedure: COLONOSCOPY WITH PROPOFOL;  Surgeon: [name], MD;  Location: [facility] END[facility]COPY;  Service: Endoscopy;  Laterality: N/A;   CORONARY ARTERY BYPASS GRAFT     1999   MASS EXCISION  04/15/2012   Procedure: EXCISION MASS;  Surgeon: [name], MD;  Location: [facility] OR;  Service: ENT;  Laterality: Left;  Left tonsil biopsy   melanomia     several skin ca removed-nose, left ankle, parotid gland left, right nodes-being followed annually..    Allergies  Allergies  Allergen Reactions   Doxycycline Other (See Comments)    BPH can't void when taking     Penicillins Rash and Itching    History of Present Illness    Evan [name] is a 75 y.o. male with a hx of CAD s/p CABG, PE on chronic anticoagulation, atrial flutter, DM2, HTN last seen 03/05/20 by Dr. Marlou Porch.  He had cardiac catheterization showing occluded diagonal graft, occluded right coronary artery graft, and patent LIMA to LAD. Stress test 2010 with no evidence of ischemia. He had ches tpain while on Ranexa and it was discontinued.   He was last seen 02/2020 doing well from a cardiac perspective. No changes were made at that time.   Seen 04/22/21 for preop and diagnosed with new onset atrial flutter. Noted occasional lightheadedness and palpitations. Echocardiogram was ordered. He was already anticoagulated.   Echo 05/13/21 LVEF 55-60%, no RWMA, LV mildly dilated, mild LVH, LA mild to moderately dilated, trivial MR, mild aortic sclerosis without stenosis, mild dilation aortic root '40mg'$  and ascending aorta 34m  Pleasant gentleman who presents today for follow up. He enjoys working part time at an [employer]. We reviewed his echocardiogram. Reports an occasional aura like sensation. Tells me his sister in law saw a neurologist and wonder if this would help him. He tells me it is more like a dizziness as  if the room is spinning. It is triggered by bright lights. Notes occasional palpitations a few times per week that self resolve and are fleeting. One episode of chest discomfort with more than usual exertion that lasted a few seconds. Has not recurred. No shortness of breath, orthopnea, edema, PND.  EKGs/Labs/Other Studies Reviewed:   The following studies were reviewed today:  Echo 05/13/21  1. Left ventricular ejection fraction, by estimation, is 55 to 60%. The  left ventricle has normal function. The left ventricle has no regional  wall motion abnormalities. The left ventricular internal cavity size was  mildly dilated. There is mild left  ventricular hypertrophy. Left  ventricular diastolic parameters are  indeterminate.   2. Right ventricular systolic function is normal. The right ventricular  size is normal. There is normal pulmonary artery systolic pressure.   3. Left atrial size was mild to moderately dilated.   4. The mitral valve is normal in structure. Trivial mitral valve  regurgitation.   5. The aortic valve is tricuspid. Aortic valve regurgitation is not  visualized. Mild aortic valve sclerosis is present, with no evidence of  aortic valve stenosis.   6. Aortic dilatation noted. There is mild dilatation of the aortic root,  measuring 40 mm. There is mild dilatation of the ascending aorta,  measuring 43 mm.   7. The inferior vena cava is normal in size with greater than 50%  respiratory variability, suggesting right atrial pressure of 3 mmHg.   EKG:  EKG is ordered today.  The ekg ordered today demonstrates atrial flutter with 4:1 AV conduction and left axis deviation.   Recent Labs: No results found for requested labs within last 8760 hours.  Recent Lipid Panel No results found for: CHOL, TRIG, HDL, CHOLHDL, VLDL, LDLCALC, LDLDIRECT Home Medications   Current Meds  Medication Sig   ANUCORT-HC 25 MG suppository Place 25 mg rectally daily as needed.   Bromfenac Sodium (PROLENSA) 0.07 % SOLN Prolensa 0.07 % eye drops   cholecalciferol (VITAMIN D) 1000 UNITS tablet Take 2,000 Units by mouth every morning.    citalopram (CELEXA) 40 MG tablet Take 40 mg by mouth every morning.   co-enzyme Q-10 50 MG capsule Take 100 mg by mouth every morning.   Cranberry 500 MG CAPS Take 500 mg by mouth daily.   Difluprednate 0.05 % EMUL Durezol 0.05 % eye drops   fenofibrate micronized (LOFIBRA) 67 MG capsule TAKE 1 CAPSULE DAILY   FLAXSEED, LINSEED, PO Take 2 tablets by mouth 2 (two) times daily.   fluticasone (CUTIVATE) 0.05 % cream Apply topically 2 (two) times daily as needed.   folic acid (FOLVITE) A999333 MCG tablet Take 400 mcg by mouth daily.    gabapentin (NEURONTIN) 300 MG capsule Take 1 capsule by mouth daily.   glyBURIDE (DIABETA) 5 MG tablet Take 5 mg by mouth every evening.   ketoconazole (NIZORAL) 2 % shampoo ketoconazole 2 % shampoo  WASH SCALP, CHEST, AND EARS 2 TO 3 TIMES A WEEK   metFORMIN (GLUCOPHAGE-XR) 500 MG 24 hr tablet Take 1,000 mg by mouth 2 (two) times daily.   metoprolol succinate (TOPROL-XL) 50 MG 24 hr tablet Take 1 tablet (50 mg total) by mouth daily. With or immediately following a meal. Please keep upcoming appt with Dr. Marlou Porch in September 2022 before anymore refills. Thank you   Multiple Vitamin (MULITIVITAMIN WITH MINERALS) TABS Take 1 tablet by mouth every evening.   nitroGLYCERIN (NITROSTAT) 0.4 MG SL tablet Place under the tongue.  omega-3 acid ethyl esters (LOVAZA) 1 g capsule TAKE 2 CAPSULES TWICE A DAY   pantoprazole (PROTONIX) 40 MG tablet Take 40 mg by mouth daily.   pioglitazone (ACTOS) 30 MG tablet Take 30 mg by mouth every morning.   potassium gluconate 595 MG TABS tablet Take 90 mg by mouth daily.    rosuvastatin (CRESTOR) 20 MG tablet Take 20 mg by mouth every evening.   Tamsulosin HCl (FLOMAX) 0.4 MG CAPS Take 0.8 mg by mouth every evening.    testosterone cypionate (DEPOTESTOTERONE CYPIONATE) 100 MG/ML injection Inject 150 mg into the muscle every 14 (fourteen) days. For IM use only   triamcinolone lotion (KENALOG) 0.1 % triamcinolone acetonide 0.1 % lotion  APPLY TO AFFECTED AREA TWICE A DAY AS NEEDED FOR FLARE/ITCH   valsartan (DIOVAN) 160 MG tablet Take 80 mg by mouth every morning.   vitamin C (ASCORBIC ACID) 500 MG tablet Take 1,000 mg by mouth every morning.   warfarin (COUMADIN) 5 MG tablet Take 5 mg by mouth every evening. As directed--1/2 tab every Monday, Wednesday, and Friday; 1 tab all other days     Review of Systems      All other systems reviewed and are otherwise negative except as noted above.  Physical Exam    VS:  BP 110/68   Pulse 65   Ht '5\' 10"'$  (1.778 m)   Wt  228 lb 6.4 oz (103.6 kg)   BMI 32.77 kg/m  , BMI Body mass index is 32.77 kg/m.  Wt Readings from Last 3 Encounters:  05/31/21 228 lb 6.4 oz (103.6 kg)  04/22/21 223 lb (101.2 kg)  02/27/21 221 lb 8 oz (100.5 kg)     GEN: Well nourished, well developed, in no acute distress. HEENT: normal. Neck: Supple, no JVD, carotid bruits, or masses. Cardiac: RRR, no murmurs, rubs, or gallops. No clubbing, cyanosis, edema.  Radials/PT 2+ and equal bilaterally.  Respiratory:  Respirations regular and unlabored, clear to auscultation bilaterally. GI: Soft, nontender, nondistended. MS: No deformity or atrophy. Skin: Warm and dry, no rash. Neuro:  Strength and sensation are intact. Psych: Normal affect.  Assessment & Plan    Atrial flutter - He is already appropriately anticoagulated due to previous PE. He has a CHADS2VASc of 5 (agex2, HTN, DM2, CAD). Continue Toprol '50mg'$  daily as rate is well controlled. Echo 05/13/21 LVEF 55-60%, no RWMA, LV mildly dilated, mild LVH, LA mild to moderately dilated, trivial MR, mild aortic sclerosis without stenosis, mild dilation aortic root 63m and ascending aorta 49m  Rate controlled by EKG today.  Continue present dose of metoprolol.  Overall asymptomatic with only rare palpitations.  As such, he will complete prostate procedure as detailed below prior to pursuing cardioversion.  Discuss cardioversion at follow-up.  Aortic dilation - 04/2021 aortic root 408mscending aorta 9m28monsider repeat echo in 2 year. Continue optimal BP control.  CAD - Stable with no anginal symptoms. No indication for ischemic evaluation.  GDMT includes Metoprolol, rosuvastatin. No aspirin due to chronic anticoagulation.   Preoperative cardiovascular clearance - He is scheduled for Holep Laser Enucleation of hte prostate with Dr. [facility]. Holding warfarin prior to his procedure will need to addressed by primary care as they manage.  New onset atrial flutter at initial preop  visit.  Subsequent echocardiogram with no significant valvular abnormalities, normal LVEF.  Given he is overall asymptomatic he may is deemed acceptable risk for the planned procedure without further cardiovascular testing.  We will plan to  pursue cardioversion after completion of his prostate procedure as it will require 4 weeks of uninterrupted anticoagulation.  Will route to Dr. Doristine Counter office so they are aware.   HTN -BP low normal though improved since discontinuation of amlodipine.  Reports occasional lightheadedness but also dizziness with low suspicion for orthostatic hypotension as it does not occur with position changes.  Tells me his blood pressure has run in the 110s for some time.  We will continue his present antihypertensive regimen.  HLD - Continue Lovaza, Crestor. LDL at goal of <70.   Chronic anticoagulation due to history of PE - Denies bleeding complications. Continue Warfarin. Managed by primary care. Will call their office and route note regarding holding Warfarin prior to upcoming procedure.  Given CHA2DS2-VASc of 5, history of PE, current atrial flutter may need to consider bridging but will defer to their office.  Carotid artery disease - Follows with Dr. Scot Dock of vascular surgery. Last duplex 2019 with R 1-39% and L 40-58% stenosed. Overdue for follow up with Dr. Scot Dock. If follow up not scheduled at next office visit, consider ordering doppler.   Disposition: Follow up 06/28/21 with Dr. Marlou Porch or APP.  Signed, Loel Dubonnet, NP 05/31/2021, 8:39 AM [place]

## 2021-05-31 ENCOUNTER — Ambulatory Visit (INDEPENDENT_AMBULATORY_CARE_PROVIDER_SITE_OTHER): Payer: 59 | Admitting: Family

## 2021-05-31 ENCOUNTER — Encounter (HOSPITAL_BASED_OUTPATIENT_CLINIC_OR_DEPARTMENT_OTHER): Payer: Self-pay | Admitting: Family

## 2021-05-31 ENCOUNTER — Other Ambulatory Visit: Payer: Self-pay

## 2021-05-31 VITALS — BP 110/68 | HR 65 | Ht 70.0 in | Wt 228.4 lb

## 2021-05-31 DIAGNOSIS — I4892 Unspecified atrial flutter: Secondary | ICD-10-CM

## 2021-05-31 DIAGNOSIS — Z7901 Long term (current) use of anticoagulants: Secondary | ICD-10-CM

## 2021-05-31 DIAGNOSIS — I10 Essential (primary) hypertension: Secondary | ICD-10-CM

## 2021-05-31 DIAGNOSIS — E785 Hyperlipidemia, unspecified: Secondary | ICD-10-CM

## 2021-05-31 DIAGNOSIS — Z01810 Encounter for preprocedural cardiovascular examination: Secondary | ICD-10-CM

## 2021-05-31 NOTE — Patient Instructions (Signed)
Medication Instructions:  Continue your current medications.   Dr. Inda Merlin will need to make recommendations regarding your Warfarin prior to your prostate procedure. We will reach out to his office.  *If you need a refill on your cardiac medications before your next appointment, please call your pharmacy*  Lab Work: None ordered today  Testing/Procedures: Your EKG today showed atrial flutter. Your echocardiogram showed your heart pumping function was normal and your heart valves are working well. This is a good result!  We will discuss cardioversion to get you back in normal rhythm at your next visit.   Follow-Up: At [facility], you and your health needs are our priority.  As part of our continuing mission to provide you with exceptional heart care, we have created designated Provider Care Teams.  These Care Teams include your primary Cardiologist (physician) and Advanced Practice Providers (APPs -  Physician Assistants and Nurse Practitioners) who all work together to provide you with the care you need, when you need it.  We recommend signing up for the patient portal called "MyChart".  Sign up information is provided on this After Visit Summary.  MyChart is used to connect with patients for Virtual Visits (Telemedicine).  Patients are able to view lab/test results, encounter notes, upcoming appointments, etc.  Non-urgent messages can be sent to your provider as well.   To learn more about what you can do with MyChart, go to [email or web address].    Your next appointment:   As scheduled with Dr. Marlou Porch   Other Instructions  Heart Healthy Diet Recommendations: A low-salt diet is recommended. Meats should be grilled, baked, or boiled. Avoid fried foods. Focus on lean protein sources like fish or chicken with vegetables and fruits. The American Heart Association is a Microbiologist!    Exercise recommendations: The American Heart Association recommends 150 minutes of moderate  intensity exercise weekly. Try 30 minutes of moderate intensity exercise 4-5 times per week. This could include walking, jogging, or swimming.   Atrial Flutter Atrial flutter is a type of abnormal heart rhythm (arrhythmia). The heart has an electrical system that tells it how to beat. In atrial flutter, the signals move rapidly in the top chambers of the heart (the atria). This makes your heart beat very fast. Atrial flutter can come and go, or it can be permanent. The goal of treatment is to prevent blood clots from forming, control your heart rate, or restore your heartbeat to a normal rhythm. If this condition is not treated, it can cause serious problems, such as a weakened heart muscle (cardiomyopathy) or a stroke. What are the causes? This condition is often caused by conditions that damage the heart's electrical system. These include: Heart conditions and heart surgery. These include heart attacks and open-heart surgery. Lung problems, such as COPD or a blood clot in the lung (pulmonary embolism, or PE). Poorly controlled high blood pressure (hypertension). Overactive thyroid (hyperthyroidism). Diabetes. In some cases, the cause of this condition is not known. What increases the risk? You are more likely to develop this condition if: You are an elderly adult. You are a man. You are overweight (obese). You have obstructive sleep apnea. You have a family history of atrial flutter. You have diabetes. You drink a lot of alcohol, especially binge drinking. You use drugs, including cannabis. You smoke. What are the signs or symptoms? Symptoms of this condition include: A feeling that your heart is pounding or racing (palpitations). Shortness of breath. Chest pain. Feeling dizzy or light-headed. Fainting.  Low blood pressure (hypotension). Fatigue. Tiring easily during exercise or activity. In some cases, there are no symptoms. How is this diagnosed? This condition may be  diagnosed with: An electrocardiogram (ECG) to check electrical signals of the heart. An ambulatory cardiac monitor to record your heart's activity for a few days. An echocardiogram to create pictures of your heart. A transesophageal echocardiogram (TEE) to create even better pictures of your heart. A stress test to check your blood supply while you exercise. Imaging tests, such as a CT scan or chest X-ray. Blood tests. How is this treated? Treatment depends on underlying conditions and how you feel when you experience atrial flutter. This condition may be treated with: Medicines to prevent blood clots or to treat heart rate or heart rhythm problems. Electrical cardioversion to reset the heart's rhythm. Ablation to remove the heart tissue that sends abnormal signals. Left atrial appendage closure to seal the area where blood clots can form. In some cases, underlying conditions will be treated. Follow these instructions at home: Medicines Take over-the-counter and prescription medicines only as told by your health care provider. Do not take any new medicines without talking to your health care provider. If you are taking blood thinners: Talk with your health care provider before you take any medicines that contain aspirin or NSAIDs, such as ibuprofen. These medicines increase your risk for dangerous bleeding. Take your medicine exactly as told, at the same time every day. Avoid activities that could cause injury or bruising, and follow instructions about how to prevent falls. Wear a medical alert bracelet or carry a card that lists what medicines you take. Lifestyle Eat heart-healthy foods. Talk with a dietitian to make an eating plan that is right for you. Do not use any products that contain nicotine or tobacco, such as cigarettes, e-cigarettes, and chewing tobacco. If you need help quitting, ask your health care provider. Do not drink alcohol. Do not use drugs, including cannabis. Lose  weight if you are overweight or obese. Exercise regularly as instructed by your health care provider. General instructions Do not use diet pills unless your health care provider approves. Diet pills may make heart problems worse. If you have obstructive sleep apnea, manage your condition as told by your health care provider. Keep all follow-up visits as told by your health care provider. This is important. Contact a health care provider if you: Notice a change in the rate, rhythm, or strength of your heartbeat. Are taking a blood thinner and you notice more bruising. Have a sudden change in weight. Tire more easily when you exercise or do heavy work. Get help right away if you have: Pain or pressure in your chest. Shortness of breath. Fainting. Increasing sweating with no known cause. Side effects of blood thinners, such as blood in your vomit, stool, or urine, or bleeding that cannot stop. Any symptoms of a stroke. "BE FAST" is an easy way to remember the main warning signs of a stroke: B - Balance. Signs are dizziness, sudden trouble walking, or loss of balance. E - Eyes. Signs are trouble seeing or a sudden change in vision. F - Face. Signs are sudden weakness or numbness of the face, or the face or eyelid drooping on one side. A - Arms. Signs are weakness or numbness in an arm. This happens suddenly and usually on one side of the body. S - Speech. Signs are sudden trouble speaking, slurred speech, or trouble understanding what people say. T - Time. Time to call  emergency services. Write down what time symptoms started. Other signs of a stroke, such as: A sudden, severe headache with no known cause. Nausea or vomiting. Seizure. These symptoms may represent a serious problem that is an emergency. Do not wait to see if the symptoms will go away. Get medical help right away. Call your local emergency services (911 in the [place] Do not drive yourself to the hospital. Summary Atrial  flutter is an abnormal heart rhythm that can give you symptoms of palpitations, shortness of breath, or fatigue. Atrial flutter is often treated with medicines to keep your heart in a normal rhythm and to prevent a stroke. Get help right away if you cannot catch your breath, or have chest pain or pressure. Get help right away if you have signs or symptoms of a stroke. This information is not intended to replace advice given to you by your health care provider. Make sure you discuss any questions you have with your health care provider. Document Revised: 03/09/2019 Document Reviewed: 03/09/2019 Elsevier Patient Education  [place].

## 2021-06-04 ENCOUNTER — Other Ambulatory Visit
Admission: RE | Admit: 2021-06-04 | Discharge: 2021-06-04 | Disposition: A | Discharge status: Home / Self Care | Payer: 59 | Source: Ambulatory Visit | Attending: Urology | Admitting: Urology

## 2021-06-04 ENCOUNTER — Other Ambulatory Visit: Payer: Self-pay

## 2021-06-04 ENCOUNTER — Encounter: Payer: Self-pay | Admitting: Urology

## 2021-06-04 HISTORY — DX: Acute myocardial infarction, unspecified: I21.9

## 2021-06-04 HISTORY — DX: Angina pectoris, unspecified: I20.9

## 2021-06-04 HISTORY — DX: Personal history of urinary calculi: Z87.442

## 2021-06-04 NOTE — Patient Instructions (Addendum)
Your procedure is scheduled on: 06/07/21 - Friday Report to the Registration Desk on the 1st floor of the [facility]. To find out your arrival time, please call [phone] between 1PM - 3PM on: 06/06/21 - Thursday  REMEMBER: Instructions that are not followed completely may result in serious medical risk, up to and including death; or upon the discretion of your surgeon and anesthesiologist your surgery may need to be rescheduled.  Do not eat food or drink any fluids after midnight the night before surgery.  No gum chewing, lozengers or hard candies.   TAKE THESE MEDICATIONS THE MORNING OF SURGERY WITH A SIP OF WATER:  - citalopram (CELEXA) 40 MG tablet - fenofibrate micronized (LOFIBRA) 67 MG capsule - gabapentin (NEURONTIN) 300 MG capsule - metoprolol succinate (TOPROL-XL) 50 MG 24 hr tablet - pantoprazole (PROTONIX) 40 MG tablet, (take one the night before and one on the morning of surgery - helps to prevent nausea after surgery.)  Stop Metformin 2 days prior to surgery.  Follow recommendations from Cardiologist, Pulmonologist or PCP regarding stopping Aspirin, Coumadin, Plavix, Eliquis, Pradaxa, or Pletal.Stop taking warfarin (COUMADIN) 5 MG tablet beginning 06/02/21.  One week prior to surgery: Stop Anti-inflammatories (NSAIDS) such as Advil, Aleve, Ibuprofen, Motrin, Naproxen, Naprosyn and Aspirin based products such as Excedrin, Goodys Powder, BC Powder.  Stop ANY OVER THE COUNTER supplements until after surgery.  You may however, continue to take Tylenol if needed for pain up until the day of surgery.  No Alcohol for 24 hours before or after surgery.  No Smoking including e-cigarettes for 24 hours prior to surgery.  No chewable tobacco products for at least 6 hours prior to surgery.  No nicotine patches on the day of surgery.  Do not use any "recreational" drugs for at least a week prior to your surgery.  Please be advised that the combination of cocaine and  anesthesia may have negative outcomes, up to and including death. If you test positive for cocaine, your surgery will be cancelled.  On the morning of surgery brush your teeth with toothpaste and water, you may rinse your mouth with mouthwash if you wish. Do not swallow any toothpaste or mouthwash.  Do not wear jewelry, make-up, hairpins, clips or nail polish.  Do not wear lotions, powders, or perfumes.   Do not shave body from the neck down 48 hours prior to surgery just in case you cut yourself which could leave a site for infection.  Also, freshly shaved skin may become irritated if using the CHG soap.  Contact lenses, hearing aids and dentures may not be worn into surgery.  Do not bring valuables to the hospital. [facility] is not responsible for any missing/lost belongings or valuables.   Notify your doctor if there is any change in your medical condition (cold, fever, infection).  Wear comfortable clothing (specific to your surgery type) to the hospital.  After surgery, you can help prevent lung complications by doing breathing exercises.  Take deep breaths and cough every 1-2 hours. Your doctor may order a device called an Incentive Spirometer to help you take deep breaths. When coughing or sneezing, hold a pillow firmly against your incision with both hands. This is called "splinting." Doing this helps protect your incision. It also decreases belly discomfort.  If you are being admitted to the hospital overnight, leave your suitcase in the car. After surgery it may be brought to your room.  If you are being discharged the day of surgery, you will  not be allowed to drive home. You will need a responsible adult (18 years or older) to drive you home and stay with you that night.   If you are taking public transportation, you will need to have a responsible adult (18 years or older) with you. Please confirm with your physician that it is acceptable to use public transportation.    Please call the [facility] Dept. at [phone] if you have any questions about these instructions.  Surgery Visitation Policy:  Patients undergoing a surgery or procedure may have one family member or support person with them as long as that person is not COVID-19 positive or experiencing its symptoms.  That person may remain in the waiting area during the procedure.  Inpatient Visitation:    Visiting hours are 7 a.m. to 8 p.m. Inpatients will be allowed two visitors daily. The visitors may change each day during the patient's stay. No visitors under the age of 3. Any visitor under the age of 39 must be accompanied by an adult. The visitor must pass COVID-19 screenings, use hand sanitizer when entering and exiting the patient's room and wear a mask at all times, including in the patient's room. Patients must also wear a mask when staff or their visitor are in the room. Masking is required regardless of vaccination status.

## 2021-06-04 NOTE — Progress Notes (Signed)
Perioperative Services  Pre-Admission/Anesthesia Testing Clinical Review  Date: 06/04/21  Patient Demographics:  Name: Evan [name] DOB:   07-25-1946 MRN:   [id number]  Planned Surgical Procedure(s):    Case: [id number] Date/Time: 06/07/21 0715   Procedure: HOLEP-LASER ENUCLEATION OF THE PROSTATE WITH MORCELLATION   Anesthesia type: Choice   Pre-op diagnosis: BPH with obstruction   Location: [facility] OR ROOM 10 / [facility] FOR ANESTHESIA GROUP   Surgeons: Billey Co, MD     NOTE: Available PAT nursing documentation and vital signs have been reviewed. Clinical nursing staff has updated patient's PMH/PSHx, current medication list, and drug allergies/intolerances to ensure comprehensive history available to assist in medical decision making as it pertains to the aforementioned surgical procedure and anticipated anesthetic course. Extensive review of available clinical information performed. Evan [name] PMH and PSHx updated with any diagnoses/procedures that  may have been inadvertently omitted during his intake with the pre-admission testing department's nursing staff.  Clinical Discussion:  Evan [name] is a 75 y.o. male who is submitted for pre-surgical anesthesia review and clearance prior to him undergoing the above procedure. Patient has never been a smoker. Pertinent PMH includes: CAD (s/p CABG), angina, atrial flutter, carotid artery disease, aortic dilatation, PE, HTN, HLD, T2DM, OSAH (no nocturnal PAP therapy), CKD, GERD (on daily PPI), anxiety, depression.  Patient is followed by cardiology Marlou Porch, MD). He was last seen in the cardiology clinic on 05/31/2021; notes reviewed.  At the time of his clinic visit, patient reporting intermittent episodes of chest pain and palpitations.  He denied any episodes of shortness of breath, PND, orthopnea, vertiginous symptoms, or presyncope/syncope.  PMH significant for cardiovascular diagnoses.  Available records limited for review;  history obtained from notes from primary cardiologist.  Patient reports that he has suffered an MI in the past (date unknown).  He underwent a three-vessel CABG procedure in 1999.  LIMA-LAD, SVG-RCA, and SVG-diagonal bypass grafts were placed.  Patient has undergone multiple cardiac catheterizations in the past.  He reports that he has cardiac stents, however procedure dates, and location of reported stents unknown.  TTE performed on 02/27/2015 revealed a normal left ventricular systolic function with an EF of 55-60%.  There were no regional wall motion abnormalities.  Aorta was normal.  There was no evidence of aortic or mitral valve stenosis or regurgitation.  Carotid duplex study performed: 05/17/2018 revealed a 1-39% stenosis within the RIGHT ICA and 40-59% stenosis within the LEFT ICA.  There was no hemodynamically significant plaque noted in either CCA.  Repeat TTE performed on 05/13/2021 revealed normal left ventricular systolic function with mild LVH; LVEF 55 to 60%.  Diastolic parameters indeterminate.  The left atrium was mildly was mild to moderately dilated.  There was trivial mitral valve regurgitation.  No valvular stenosis.  There was mild dilatation of the aortic root at 40 mm and of the ascending aorta measuring 43 mm.  Patient with recent diagnosis of new onset atrial flutter. CHA2DS2-VASc Score = 5 (age x 2, HTN, T2DM, CAD).  Patient chronically anticoagulated using daily warfarin; compliant with therapy with no evidence of GI bleeding.  Rate and rhythm maintained with beta-blocker monotherapy.  Plans are for DCCV procedure in the near future. Blood pressure well controlled at 110/68 on the aforementioned beta-blocker and ARB therapies.  Patient is on a statin amphetamine fibrate therapy for his HLD.  T2DM well controlled per patient report; no recent HgbA1c for review.  Patient is on exogenous testosterone therapy. Functional capacity, as defined  by DASI, is documented as being >/=  4 METS.  No changes were made to patient's medication regimen.  Patient to follow-up with outpatient cardiology as already scheduled on 06/28/2021.  Patient scheduled to undergo HoLEP procedure on 06/07/2021 with Dr. Nickolas Madrid, MD. given patient's past medical history significant for cardiovascular disease and intervention, presurgical cardiac clearance was sought by the performing surgeon's office and PAT team.  Per cardiology, "given he is overall symptomatic patient is deemed an ACCEPTABLE risk for the planned procedure without further cardiovascular testing.  We will plan to pursue cardioversion after completion of his prostate procedure as it will require 4 weeks of uninterrupted anticoagulation therapy".  Again, this patient is on daily anticoagulation therapy.  He has been instructed on recommendations for holding his daily warfarin for 5 days prior to his procedure with plans to restart as soon as postoperative bleeding was felt to be minimized by his primary attending surgeon.  Patient is aware that his last dose of warfarin will be on 06/01/2021.  Patient denies previous perioperative complications with anesthesia in the past. In review of the available records, it is noted that patient underwent a MAC anesthetic course at [facility] in 08/2014 without documented complications.   Vitals with BMI 05/31/2021 04/22/2021 02/27/2021  Height _0  _1  _2   Weight 228 lbs 6 oz 223 lbs 221 lbs 8 oz  BMI 92.33 32 00.76  Systolic 226 86 333  Diastolic 68 44 80  Pulse 65 64 65    Providers/Specialists:   NOTE: Primary physician provider listed below. Patient may have been seen by APP or partner within same practice.   PROVIDER ROLE / SPECIALTY LAST Ranae Pila, MD Urology (Surgeon) 02/27/2021  Josetta Huddle, MD Primary Care Provider ???  Candee Furbish, MD Cardiology 05/31/2021  Turner Daniels, MD Oncology 05/07/2021   Allergies:  Doxycycline and  Penicillins  Current Home Medications:   No current facility-administered medications for this encounter.    ANUCORT-HC 25 MG suppository   Bromfenac Sodium (PROLENSA) 0.07 % SOLN   cholecalciferol (VITAMIN D) 1000 UNITS tablet   citalopram (CELEXA) 40 MG tablet   co-enzyme Q-10 50 MG capsule   Cranberry 500 MG CAPS   Difluprednate 0.05 % EMUL   fenofibrate micronized (LOFIBRA) 67 MG capsule   FLAXSEED, LINSEED, PO   fluticasone (CUTIVATE) 5.45 % cream   folic acid (FOLVITE) 625 MCG tablet   gabapentin (NEURONTIN) 300 MG capsule   glyBURIDE (DIABETA) 5 MG tablet   ketoconazole (NIZORAL) 2 % shampoo   metFORMIN (GLUCOPHAGE-XR) 500 MG 24 hr tablet   metoprolol succinate (TOPROL-XL) 50 MG 24 hr tablet   Multiple Vitamin (MULITIVITAMIN WITH MINERALS) TABS   nitroGLYCERIN (NITROSTAT) 0.4 MG SL tablet   omega-3 acid ethyl esters (LOVAZA) 1 g capsule   pantoprazole (PROTONIX) 40 MG tablet   pioglitazone (ACTOS) 30 MG tablet   potassium gluconate 595 MG TABS tablet   rosuvastatin (CRESTOR) 20 MG tablet   Tamsulosin HCl (FLOMAX) 0.4 MG CAPS   testosterone cypionate (DEPOTESTOTERONE CYPIONATE) 100 MG/ML injection   triamcinolone lotion (KENALOG) 0.1 %   valsartan (DIOVAN) 160 MG tablet   vitamin C (ASCORBIC ACID) 500 MG tablet   warfarin (COUMADIN) 5 MG tablet   History:   Past Medical History:  Diagnosis Date   Acquired dilation of ascending aorta and aortic root (HCC)    a.) measurements by TTE in 04/2021 --> root 40 mm and ascending aorta 43  mm   Anginal pain (HCC)    Anxiety    Atrial flutter ([place]) 04/22/2021   a.) CHADS2VASc = 5 (age x 2, HTN, T2DM, CAD); b.) daily warfarin   Carotid disease, bilateral ([place])    a.) Doppler in 2019 --> 1-39% stenosis on RIGHT and 40-58% on LEFT   Cataracts, bilateral    Chronic anticoagulation    Warfarin   Chronic renal insufficiency    Coronary artery disease    a.) s/p 3v CABG in 1999   Depression    GERD (gastroesophageal reflux  disease)    History of kidney stones    Hypercholesteremia    Hypertension    Kidney stone    Low testosterone    on TRT injections   Malignant melanoma ([place])    a.) face, nose, left leg; b.) Tx'd with chemotherapy + XRT at [facility]   Myocardial infarction [facility])    Pulmonary emboli ([place])    S/P CABG x 3 1999   a.) LIMA-LAD, SVG-RCA, SVG-diagnonal   Sleep apnea    not using nocturnal PAP therapy   T2DM (type 2 diabetes mellitus) ([place])    Past Surgical History:  Procedure Laterality Date   CARDIAC CATHETERIZATION     x3     last 1999   CATARACT EXTRACTION Bilateral    COLONOSCOPY WITH PROPOFOL N/A 09/26/2014   Procedure: COLONOSCOPY WITH PROPOFOL;  Surgeon: [name], MD;  Location: [facility];  Service: Endoscopy;  Laterality: N/A;   CORONARY ARTERY BYPASS GRAFT N/A 1999   3v; LIMA-LAD, SVG-RCA, SVG-diagonal   KNEE ARTHROSCOPY Left    LAPAROSCOPIC CHOLECYSTECTOMY     MASS EXCISION  04/15/2012   Procedure: EXCISION MASS;  Surgeon: [name], MD;  Location: [facility];  Service: ENT;  Laterality: Left;  Left tonsil biopsy   melanomia     several skin ca removed-nose, left ankle, parotid gland left, right nodes-being followed annually..   No family history on file. Social History   Tobacco Use   Smoking status: Never   Smokeless tobacco: Never  Substance Use Topics   Alcohol use: No   Drug use: No    Pertinent Clinical Results:  LABS: Labs reviewed: Acceptable for surgery.   Ref Range & Units 05/07/2021  WBC 4.4 - 11.0 x 10*3/uL 5.3   RBC 4.50 - 5.90 x 10*6/uL 5.26   Hemoglobin 14.0 - 17.5 G/DL 14.5   Hematocrit 41.5 - 50.4 % 44.4   MCV 80.0 - 96.0 FL 84.5   MCH 27.5 - 33.2 PG 27.6   MCHC 33.0 - 37.0 G/DL 32.7 Low    RDW 12.3 - 17.0 % 20.8 High    Platelets 150 - 450 X 10*3/uL 191   MPV 6.8 - 10.2 FL 6.3 Low    Neutrophil % % 66   Lymphocyte % % 24   Monocyte % % 8   Eosinophil % % 2   Basophil % % 1   Neutrophil Absolute 1.8 - 7.8 x 10*3/uL 3.5    Lymphocyte Absolute 1.0 - 4.8 x 10*3/uL 1.2   Monocyte Absolute 0.0 - 0.8 x 10*3/uL 0.4   Eosinophil Absolute 0.0 - 0.5 x 10*3/uL 0.1   Basophil Absolute 0.0 - 0.2 x 10*3/uL 0.0   nRBC <=0 x 10*3/uL 0   Resulting Agency  [facility]  Specimen Collected: 05/07/21 08:21 Last Resulted: 05/07/21 08:26  Received From: [facility]  Result Received: 05/08/21 14:38  Ref Range & Units 05/07/2021  Sodium 135 - 146 MMOL/L 138   Potassium 3.5 - 5.3 MMOL/L 4.9   Chloride 98 - 110 MMOL/L 104   CO2 23 - 30 MMOL/L 27   BUN 8 - 24 MG/DL 37 High    Glucose 70 - 99 MG/DL 213 High    Creatinine 0.50 - 1.50 MG/DL 1.99 High    Calcium 8.5 - 10.5 MG/DL 9.4   Total Protein 6.0 - 8.3 G/DL 6.8   Albumin  3.5 - 5.0 G/DL 4.1   Total Bilirubin 0.1 - 1.2 MG/DL 0.5   Alkaline Phosphatase 34 - 104 IU/L or U/L 41   AST (SGOT) 5 - 40 IU/L or U/L 34   ALT (SGPT) 5 - 50 IU/L or U/L 45   Anion Gap 4 - 14 MMOL/L 7   Est. GFR >=60 ML/MIN/1.73 M*2  ML/MIN/1.73 M*2 34 Low    Resulting Agency  [facility]   Specimen Collected: 05/07/21 08:21 Last Resulted: 05/07/21 08:53  Received From: [facility]  Result Received: 05/08/21 14:38    ECG: Date: 05/13/2021 Time ECG obtained: 0825 AM Rate: 63 bpm Rhythm: atrial flutter Axis (leads I and aVF): Normal Intervals: QRS 132 ms. QTc 470 ms. ST segment and T wave changes: No evidence of acute ST segment elevation or depression Comparison: Similar to previous tracing obtained on 04/22/2021   IMAGING / PROCEDURES: TRANSTHORACIC ECHOCARDIOGRAM performed in 05/13/2021 Left ventricular ejection fraction, by estimation, is 55 to 60%. The left ventricle has normal function. The left ventricle has no regional  wall motion abnormalities. The left ventricular internal cavity size was mildly dilated. There is mild left ventricular hypertrophy. Left ventricular diastolic parameters are indeterminate.   Right ventricular systolic function is normal. The right ventricular size is normal. There is normal pulmonary artery systolic pressure.  Left atrial size was mild to moderately dilated.  The mitral valve is normal in structure. Trivial mitral valve regurgitation.  The aortic valve is tricuspid. Aortic valve regurgitation is not visualized. Mild aortic valve sclerosis is present, with no evidence of  aortic valve stenosis.  Aortic dilatation noted. There is mild dilatation of the aortic root, measuring 40 mm. There is mild dilatation of the ascending aorta, measuring 43 mm.  The inferior vena cava is normal in size with greater than 50% respiratory variability, suggesting right atrial pressure of 3 mmHg.  DIAGNOSTIC RADIOGRAPHS CHEST PA AND LATERAL performed at 05/07/2021 Cardiac silhouette is borderline enlarged and unchanged Pulmonary vasculature within normal limits No pleural effusion or PTX No interval osseous or soft tissue changes No evidence of metastatic disease within the chest No evidence of acute cardiac or pulmonary abnormality  BILATERAL CAROTID DUPLEX performed on 05/17/2018 1-39% stenosis of the RIGHT ICA 40-59% stenosis of the LEFT ICA Nonhemodynamically significant plaque (<50%) noted in the BILATERAL CCA's Bilateral vertebral arteries demonstrate antegrade flow Normal flow dynamics were seen in the BILATERAL subclavian arteries  Impression and Plan:  Evan [name] has been referred for pre-anesthesia review and clearance prior to him undergoing the planned anesthetic and procedural courses. Available labs, pertinent testing, and imaging results were personally reviewed by me. This patient has been appropriately cleared by cardiology with an overall ACCEPTABLE risk of significant perioperative cardiovascular complications.  Based on clinical review performed today (06/04/21), barring any significant acute changes in the patient's overall condition, it is anticipated  that he will be able to proceed with the planned surgical intervention. Any acute changes  in clinical condition may necessitate his procedure being postponed and/or cancelled. Patient will meet with anesthesia team (MD and/or CRNA) on the day of his procedure for preoperative evaluation/assessment. Questions regarding anesthetic course will be fielded at that time.   Pre-surgical instructions were reviewed with the patient during his PAT appointment and questions were fielded by PAT clinical staff. Patient was advised that if any questions or concerns arise prior to his procedure then he should return a call to PAT and/or his surgeon's office to discuss.  Honor Loh, MSN, APRN, FNP-C, [facility]  Peri-operative Services Nurse Practitioner Phone: [phone] Fax: [phone] 06/04/21 12:31 PM  NOTE: This note has been prepared using Dragon dictation software. Despite my best ability to proofread, there is always the potential that unintentional transcriptional errors may still occur from this process.

## 2021-06-07 ENCOUNTER — Ambulatory Visit
Admission: RE | Admit: 2021-06-07 | Discharge: 2021-06-07 | Disposition: A | Discharge status: Home / Self Care | Payer: 59 | Attending: Urology | Admitting: Urology

## 2021-06-07 ENCOUNTER — Ambulatory Visit: Payer: 59 | Admitting: Urgent Care

## 2021-06-07 ENCOUNTER — Encounter: Payer: Self-pay | Admitting: Urology

## 2021-06-07 ENCOUNTER — Encounter
Admission: RE | Disposition: A | Discharge status: Home / Self Care | Payer: Self-pay | Source: Home / Self Care | Attending: Urology

## 2021-06-07 ENCOUNTER — Telehealth: Payer: Self-pay | Admitting: *Deleted

## 2021-06-07 DIAGNOSIS — Z880 Allergy status to penicillin: Secondary | ICD-10-CM | POA: Diagnosis not present

## 2021-06-07 DIAGNOSIS — N401 Enlarged prostate with lower urinary tract symptoms: Secondary | ICD-10-CM | POA: Diagnosis not present

## 2021-06-07 DIAGNOSIS — K219 Gastro-esophageal reflux disease without esophagitis: Secondary | ICD-10-CM | POA: Insufficient documentation

## 2021-06-07 DIAGNOSIS — R351 Nocturia: Secondary | ICD-10-CM | POA: Diagnosis not present

## 2021-06-07 DIAGNOSIS — E7800 Pure hypercholesterolemia, unspecified: Secondary | ICD-10-CM | POA: Diagnosis not present

## 2021-06-07 DIAGNOSIS — Z951 Presence of aortocoronary bypass graft: Secondary | ICD-10-CM | POA: Insufficient documentation

## 2021-06-07 DIAGNOSIS — I129 Hypertensive chronic kidney disease with stage 1 through stage 4 chronic kidney disease, or unspecified chronic kidney disease: Secondary | ICD-10-CM | POA: Insufficient documentation

## 2021-06-07 DIAGNOSIS — R3912 Poor urinary stream: Secondary | ICD-10-CM | POA: Insufficient documentation

## 2021-06-07 DIAGNOSIS — Z86711 Personal history of pulmonary embolism: Secondary | ICD-10-CM | POA: Insufficient documentation

## 2021-06-07 DIAGNOSIS — R339 Retention of urine, unspecified: Secondary | ICD-10-CM | POA: Insufficient documentation

## 2021-06-07 DIAGNOSIS — N138 Other obstructive and reflux uropathy: Secondary | ICD-10-CM | POA: Diagnosis not present

## 2021-06-07 DIAGNOSIS — R3915 Urgency of urination: Secondary | ICD-10-CM | POA: Diagnosis not present

## 2021-06-07 DIAGNOSIS — N189 Chronic kidney disease, unspecified: Secondary | ICD-10-CM | POA: Insufficient documentation

## 2021-06-07 DIAGNOSIS — Z85820 Personal history of malignant melanoma of skin: Secondary | ICD-10-CM | POA: Diagnosis not present

## 2021-06-07 DIAGNOSIS — G4730 Sleep apnea, unspecified: Secondary | ICD-10-CM | POA: Insufficient documentation

## 2021-06-07 DIAGNOSIS — Z9119 Patient's noncompliance with other medical treatment and regimen: Secondary | ICD-10-CM | POA: Insufficient documentation

## 2021-06-07 DIAGNOSIS — E1122 Type 2 diabetes mellitus with diabetic chronic kidney disease: Secondary | ICD-10-CM | POA: Insufficient documentation

## 2021-06-07 DIAGNOSIS — Z881 Allergy status to other antibiotic agents status: Secondary | ICD-10-CM | POA: Diagnosis not present

## 2021-06-07 DIAGNOSIS — R3914 Feeling of incomplete bladder emptying: Secondary | ICD-10-CM

## 2021-06-07 DIAGNOSIS — I2510 Atherosclerotic heart disease of native coronary artery without angina pectoris: Secondary | ICD-10-CM | POA: Diagnosis not present

## 2021-06-07 HISTORY — DX: Aortic ectasia, unspecified site: I77.819

## 2021-06-07 HISTORY — PX: HOLEP-LASER ENUCLEATION OF THE PROSTATE WITH MORCELLATION: SHX6641

## 2021-06-07 HISTORY — DX: Long term (current) use of anticoagulants: Z79.01

## 2021-06-07 HISTORY — DX: Disorder of arteries and arterioles, unspecified: I77.9

## 2021-06-07 HISTORY — DX: Other specified abnormal findings of blood chemistry: R79.89

## 2021-06-07 HISTORY — DX: Type 2 diabetes mellitus without complications: E11.9

## 2021-06-07 HISTORY — DX: Thoracic aortic ectasia: I77.810

## 2021-06-07 LAB — BASIC METABOLIC PANEL
Anion gap: 6 (ref 5–15)
BUN: 41 mg/dL — ABNORMAL HIGH (ref 8–23)
CO2: 27 mmol/L (ref 22–32)
Calcium: 9.7 mg/dL (ref 8.9–10.3)
Chloride: 104 mmol/L (ref 98–111)
Creatinine, Ser: 2.1 mg/dL — ABNORMAL HIGH (ref 0.61–1.24)
GFR, Estimated: 32 mL/min — ABNORMAL LOW (ref >60)
Glucose, Bld: 182 mg/dL — ABNORMAL HIGH (ref 70–99)
Potassium: 5.2 mmol/L — ABNORMAL HIGH (ref 3.5–5.1)
Sodium: 137 mmol/L (ref 135–145)

## 2021-06-07 LAB — GLUCOSE, CAPILLARY: Glucose-Capillary: 177 mg/dL — ABNORMAL HIGH (ref 70–99)

## 2021-06-07 SURGERY — ENUCLEATION, PROSTATE, USING LASER, WITH MORCELLATION
Anesthesia: General

## 2021-06-07 MED ORDER — ROCURONIUM BROMIDE 100 MG/10ML IV SOLN
INTRAVENOUS | Status: DC | PRN
  Administered 2021-06-07: 50 mg via INTRAVENOUS
  Administered 2021-06-07: 5 mg via INTRAVENOUS
  Administered 2021-06-07: 10 mg via INTRAVENOUS

## 2021-06-07 MED ORDER — ONDANSETRON HCL 4 MG/2ML IJ SOLN
INTRAMUSCULAR | Status: DC | PRN
  Administered 2021-06-07: 4 mg via INTRAVENOUS

## 2021-06-07 MED ORDER — BELLADONNA ALKALOIDS-OPIUM 16.2-60 MG RE SUPP
RECTAL | Status: AC
  Filled 2021-06-07: qty 1

## 2021-06-07 MED ORDER — CHLORHEXIDINE GLUCONATE 0.12 % MT SOLN
OROMUCOSAL | Status: AC
  Administered 2021-06-07: 15 mL via OROMUCOSAL
  Filled 2021-06-07: qty 15

## 2021-06-07 MED ORDER — PHENYLEPHRINE HCL (PRESSORS) 10 MG/ML IV SOLN
INTRAVENOUS | Status: AC
  Filled 2021-06-07: qty 1

## 2021-06-07 MED ORDER — ORAL CARE MOUTH RINSE: 15.0000 mL | Freq: Once | OROMUCOSAL | Status: AC

## 2021-06-07 MED ORDER — OXYCODONE HCL 5 MG/5ML PO SOLN
5.0000 mg | Freq: Once | ORAL | Status: DC | PRN
Start: 2021-06-07 — End: 2021-06-07

## 2021-06-07 MED ORDER — ONDANSETRON HCL 4 MG/2ML IJ SOLN
INTRAMUSCULAR | Status: AC
  Filled 2021-06-07: qty 2

## 2021-06-07 MED ORDER — ACETAMINOPHEN 10 MG/ML IV SOLN
INTRAVENOUS | Status: AC
  Filled 2021-06-07: qty 100

## 2021-06-07 MED ORDER — DEXAMETHASONE SODIUM PHOSPHATE 10 MG/ML IJ SOLN
INTRAMUSCULAR | Status: AC
  Filled 2021-06-07: qty 1

## 2021-06-07 MED ORDER — LIDOCAINE HCL (PF) 2 % IJ SOLN
INTRAMUSCULAR | Status: AC
  Filled 2021-06-07: qty 10

## 2021-06-07 MED ORDER — FENTANYL CITRATE (PF) 100 MCG/2ML IJ SOLN: 25.0000 ug | INTRAMUSCULAR | Status: DC | PRN

## 2021-06-07 MED ORDER — PROPOFOL 10 MG/ML IV BOLUS
INTRAVENOUS | Status: AC
  Filled 2021-06-07: qty 60

## 2021-06-07 MED ORDER — FENTANYL CITRATE (PF) 100 MCG/2ML IJ SOLN
INTRAMUSCULAR | Status: DC | PRN
  Administered 2021-06-07: 50 ug via INTRAVENOUS

## 2021-06-07 MED ORDER — MIDAZOLAM HCL 2 MG/2ML IJ SOLN
INTRAMUSCULAR | Status: AC
  Filled 2021-06-07: qty 2

## 2021-06-07 MED ORDER — LIDOCAINE HCL (CARDIAC) PF 100 MG/5ML IV SOSY
PREFILLED_SYRINGE | INTRAVENOUS | Status: DC | PRN
  Administered 2021-06-07: 80 mg via INTRAVENOUS

## 2021-06-07 MED ORDER — DEXAMETHASONE SODIUM PHOSPHATE 10 MG/ML IJ SOLN
INTRAMUSCULAR | Status: DC | PRN
  Administered 2021-06-07: 4 mg via INTRAVENOUS

## 2021-06-07 MED ORDER — OXYCODONE HCL 5 MG PO TABS: 5.0000 mg | ORAL_TABLET | Freq: Once | ORAL | Status: DC | PRN

## 2021-06-07 MED ORDER — SODIUM CHLORIDE 0.9 % IR SOLN
Status: DC | PRN
  Administered 2021-06-07: 24000 mL via INTRAVESICAL

## 2021-06-07 MED ORDER — ACETAMINOPHEN 10 MG/ML IV SOLN: 1000.0000 mg | Freq: Once | INTRAVENOUS | Status: DC | PRN

## 2021-06-07 MED ORDER — PROPOFOL 10 MG/ML IV BOLUS
INTRAVENOUS | Status: DC | PRN
Start: 2021-06-07 — End: 2021-06-07
  Administered 2021-06-07: 120 mg via INTRAVENOUS

## 2021-06-07 MED ORDER — LACTATED RINGERS IV SOLN: INTRAVENOUS | Status: DC | PRN

## 2021-06-07 MED ORDER — GLYCOPYRROLATE 0.2 MG/ML IJ SOLN
INTRAMUSCULAR | Status: AC
  Filled 2021-06-07: qty 1

## 2021-06-07 MED ORDER — SUGAMMADEX SODIUM 200 MG/2ML IV SOLN
INTRAVENOUS | Status: DC | PRN
  Administered 2021-06-07: 207.2 mg via INTRAVENOUS

## 2021-06-07 MED ORDER — ACETAMINOPHEN 10 MG/ML IV SOLN
INTRAVENOUS | Status: DC | PRN
  Administered 2021-06-07: 1000 mg via INTRAVENOUS

## 2021-06-07 MED ORDER — CEFAZOLIN SODIUM-DEXTROSE 2-4 GM/100ML-% IV SOLN
INTRAVENOUS | Status: AC
  Filled 2021-06-07: qty 100

## 2021-06-07 MED ORDER — HYDROCODONE-ACETAMINOPHEN 5-325 MG PO TABS: 1.0000 | ORAL_TABLET | Freq: Four times a day (QID) | ORAL | 0 refills | Status: AC | PRN

## 2021-06-07 MED ORDER — SODIUM CHLORIDE 0.9 % IV SOLN: INTRAVENOUS | Status: DC

## 2021-06-07 MED ORDER — FENTANYL CITRATE (PF) 100 MCG/2ML IJ SOLN
INTRAMUSCULAR | Status: AC
  Filled 2021-06-07: qty 2

## 2021-06-07 MED ORDER — CEFAZOLIN SODIUM-DEXTROSE 2-4 GM/100ML-% IV SOLN
2.0000 g | INTRAVENOUS | Status: AC
  Administered 2021-06-07: 2 g via INTRAVENOUS

## 2021-06-07 MED ORDER — CHLORHEXIDINE GLUCONATE 0.12 % MT SOLN: 15.0000 mL | Freq: Once | OROMUCOSAL | Status: AC

## 2021-06-07 MED ORDER — EPHEDRINE 5 MG/ML INJ
INTRAVENOUS | Status: AC
  Filled 2021-06-07: qty 5

## 2021-06-07 MED ORDER — PROMETHAZINE HCL 25 MG/ML IJ SOLN
6.2500 mg | INTRAMUSCULAR | Status: DC | PRN
Start: 2021-06-07 — End: 2021-06-07

## 2021-06-07 MED ORDER — PHENYLEPHRINE HCL (PRESSORS) 10 MG/ML IV SOLN
INTRAVENOUS | Status: DC | PRN
  Administered 2021-06-07: 100 ug via INTRAVENOUS
  Administered 2021-06-07: 50 ug via INTRAVENOUS
  Administered 2021-06-07: 100 ug via INTRAVENOUS

## 2021-06-07 SURGICAL SUPPLY — 38 items
ADAPTER IRRIG TUBE 2 SPIKE SOL (ADAPTER) ×4 IMPLANT
ADPR TBG 2 SPK PMP STRL ASCP (ADAPTER) ×2
BAG DRN LRG CPC RND TRDRP CNTR (MISCELLANEOUS) ×1
BAG URO DRAIN 4000ML (MISCELLANEOUS) ×2 IMPLANT
CATH FOLEY 3WAY 30CC 24FR (CATHETERS) ×2
CATH URETL OPEN 5X70 (CATHETERS) ×1 IMPLANT
CATH URTH STD 24FR FL 3W 2 (CATHETERS) ×1 IMPLANT
CONTAINER COLLECT MORCELLATR (MISCELLANEOUS) ×1 IMPLANT
DRAPE UTILITY 15X26 TOWEL STRL (DRAPES) IMPLANT
ELECT BIVAP BIPO 22/24 DONUT (ELECTROSURGICAL)
ELECTRD BIVAP BIPO 22/24 DONUT (ELECTROSURGICAL) IMPLANT
FIBER LASER FLEXIVA PULSE 550 (Laser) ×2 IMPLANT
FILTER OVERFLOW MORCELLATOR (FILTER) ×1 IMPLANT
GAUZE 4X4 16PLY ~~LOC~~+RFID DBL (SPONGE) ×3 IMPLANT
GLOVE SURG UNDER POLY LF SZ7.5 (GLOVE) ×2 IMPLANT
GOWN STRL REUS W/ TWL LRG LVL3 (GOWN DISPOSABLE) ×1 IMPLANT
GOWN STRL REUS W/ TWL XL LVL3 (GOWN DISPOSABLE) ×1 IMPLANT
GOWN STRL REUS W/TWL LRG LVL3 (GOWN DISPOSABLE) ×2
GOWN STRL REUS W/TWL XL LVL3 (GOWN DISPOSABLE) ×2
HOLDER FOLEY CATH W/STRAP (MISCELLANEOUS) ×2 IMPLANT
IV NS IRRIG 3000ML ARTHROMATIC (IV SOLUTION) ×12 IMPLANT
KIT TURNOVER CYSTO (KITS) ×2 IMPLANT
MANIFOLD NEPTUNE II (INSTRUMENTS) ×1 IMPLANT
MBRN O SEALING YLW 17 FOR INST (MISCELLANEOUS) ×2
MEMBRANE SLNG YLW 17 FOR INST (MISCELLANEOUS) ×1 IMPLANT
MORCELLATOR COLLECT CONTAINER (MISCELLANEOUS) ×2
MORCELLATOR OVERFLOW FILTER (FILTER) ×2
MORCELLATOR ROTATION 4.75 335 (MISCELLANEOUS) ×2 IMPLANT
PACK CYSTO AR (MISCELLANEOUS) ×2 IMPLANT
SET CYSTO W/LG BORE CLAMP LF (SET/KITS/TRAYS/PACK) ×2 IMPLANT
SET IRRIG Y TYPE TUR BLADDER L (SET/KITS/TRAYS/PACK) ×2 IMPLANT
SLEEVE PROTECTION STRL DISP (MISCELLANEOUS) ×4 IMPLANT
SURGILUBE 2OZ TUBE FLIPTOP (MISCELLANEOUS) ×2 IMPLANT
SYR TOOMEY IRRIG 70ML (MISCELLANEOUS) ×2
SYRINGE TOOMEY IRRIG 70ML (MISCELLANEOUS) ×1 IMPLANT
TUBE PUMP MORCELLATOR PIRANHA (TUBING) ×2 IMPLANT
WATER STERILE IRR 1000ML POUR (IV SOLUTION) ×2 IMPLANT
WATER STERILE IRR 500ML POUR (IV SOLUTION) ×2 IMPLANT

## 2021-06-07 NOTE — Discharge Instructions (Signed)

## 2021-06-07 NOTE — Op Note (Signed)
Date of procedure: 06/07/21  Preoperative diagnosis:  BPH with obstruction  Postoperative diagnosis:  Same  Procedure: HoLEP (Holmium Laser Enucleation of the Prostate)  Surgeon: Nickolas Madrid, MD  Anesthesia: General  Complications: None  Intraoperative findings:  Large median lobe, moderate bladder trabeculations Uncomplicated HOLEP, ureteral orifices and verumontanum intact at conclusion of case  EBL: Minimal  Specimens: Prostate chips  Enucleation time: 27 minutes  Morcellation time: 8 minutes  Intra-op weight: 40 g  Drains: 24 French three-way, 60 cc in balloon  Indication: Evan [name] is a 75 y.o. patient with BPH and obstructive urinary symptoms with elevated PVRs, and prostate measured 130 g on outside transrectal ultrasound.  After reviewing the management options for treatment, they elected to proceed with the above surgical procedure(s). We have discussed the potential benefits and risks of the procedure, side effects of the proposed treatment, the likelihood of the patient achieving the goals of the procedure, and any potential problems that might occur during the procedure or recuperation.  We specifically discussed the risks of bleeding, infection, hematuria and clot retention, need for additional procedures, possible overnight hospital stay, temporary urgency and incontinence, rare long-term incontinence, and retrograde ejaculation.  Informed consent has been obtained.   Description of procedure:  The patient was taken to the operating room and general anesthesia was induced.  The patient was placed in the dorsal lithotomy position, prepped and draped in the usual sterile fashion, and preoperative antibiotics(Ancef) were administered.  SCDs were placed for DVT prophylaxis.  A preoperative time-out was performed.   Leander Rams sounds were used to gently dilated the urethra up to 43F. The 14 French continuous flow resectoscope was inserted into the urethra  using the visual obturator  The prostate was large with a high bladder neck and massive median lobe. The bladder was thoroughly inspected and notable for moderate trabeculations but no suspicious lesions.  The ureteral orifices were located in orthotopic position.  The laser was set to 2 J and 50 Hz and was used to make a lambda incision just proximal to the verumontanum down to the level of the capsule.  A 5 and 7 o'clock incisions were then made down to the level of the capsule from the bladder neck to the verumontanum, and the median lobe enucleated into the bladder.  The lateral lobes were then incised circumferentially until they were disconnected from the surrounding tissue.  The capsule was examined and laser was used for meticulous hemostasis.    The 24 French resectoscope was then switched out for the 68 French nephroscope and the lobes were morcellated and the tissue sent to pathology.  A 24 French three-way catheter was inserted easily with the aid of a catheter guide, and 60 cc were placed in the balloon.  Urine was light pink.  The catheter irrigated easily with a Toomey syringe.  CBI was initiated. A belladonna suppository was placed.  The patient tolerated the procedure well without any immediate complications and was extubated and transferred to the recovery room in stable condition.  Urine was clear on fast CBI.  Disposition: Stable to PACU  Plan: Wean CBI in PACU, anticipate discharge home today with void trial in clinic in 2-3 days Okay to resume Coumadin Tuesday if urine is clear to light pink  Nickolas Madrid, MD 06/07/2021

## 2021-06-07 NOTE — Telephone Encounter (Signed)
Pt's wife calling stating that pt had HoLep today and he was having some bleeding around the cath and wanted to know if this was normal. Per discharge instructions this is normal, wife is going to have pt push fluids and call if anything changes.

## 2021-06-07 NOTE — Transfer of Care (Signed)
Immediate Anesthesia Transfer of Care Note  Patient: Evan [name]  Procedure(s) Performed: HOLEP-LASER ENUCLEATION OF THE PROSTATE WITH MORCELLATION  Patient Location: PACU  Anesthesia Type:General  Level of Consciousness: awake, alert  and oriented  Airway & Oxygen Therapy: Patient Spontanous Breathing and Patient connected to face mask oxygen  Post-op Assessment: Report given to RN and Post -op Vital signs reviewed and stable  Post vital signs: Reviewed and stable  Last Vitals:  Vitals Value Taken Time  BP 146/80 06/07/21 0846  Temp    Pulse 81 06/07/21 0848  Resp 17 06/07/21 0848  SpO2 100 % 06/07/21 0848  Vitals shown include unvalidated device data.  Last Pain:  Vitals:   06/07/21 [id number]  TempSrc: Temporal  PainSc: 0-No pain         Complications: No notable events documented.

## 2021-06-07 NOTE — Anesthesia Procedure Notes (Signed)
Procedure Name: Intubation Date/Time: 06/07/2021 7:45 AM Performed by: Demetrius Charity, CRNA Pre-anesthesia Checklist: Patient identified, Patient being monitored, Timeout performed, Emergency Drugs available and Suction available Patient Re-evaluated:Patient Re-evaluated prior to induction Oxygen Delivery Method: Circle system utilized Preoxygenation: Pre-oxygenation with 100% oxygen Induction Type: IV induction Ventilation: Mask ventilation without difficulty and Oral airway inserted - appropriate to patient size Laryngoscope Size: 4 and McGraph Grade View: Grade I Tube type: Oral Tube size: 7.0 mm Number of attempts: 1 Airway Equipment and Method: Stylet Placement Confirmation: ETT inserted through vocal cords under direct vision, positive ETCO2 and breath sounds checked- equal and bilateral Secured at: 23 cm Tube secured with: Tape Dental Injury: Teeth and Oropharynx as per pre-operative assessment

## 2021-06-07 NOTE — Anesthesia Postprocedure Evaluation (Signed)
Anesthesia Post Note  Patient: Evan [name]  Procedure(s) Performed: HOLEP-LASER ENUCLEATION OF THE PROSTATE WITH MORCELLATION  Patient location during evaluation: PACU Anesthesia Type: General Level of consciousness: awake and alert Pain management: pain level controlled Vital Signs Assessment: post-procedure vital signs reviewed and stable Respiratory status: spontaneous breathing, nonlabored ventilation and respiratory function stable Cardiovascular status: blood pressure returned to baseline and stable Postop Assessment: no apparent nausea or vomiting Anesthetic complications: no   No notable events documented.   Last Vitals:  Vitals:   06/07/21 0927 06/07/21 0953  BP: 118/71 121/64  Pulse: 62 67  Resp: 16 16  Temp: (!) 36.1 C   SpO2: 98% 98%    Last Pain:  Vitals:   06/07/21 0953  TempSrc:   PainSc: 0-No pain                 [place] Ouch

## 2021-06-07 NOTE — H&P (Signed)
06/07/21 6:54 AM   Evan [name] 04/01/1946 [id number]  CC: BPH  HPI: Comorbid 75 year old male with a long history of urinary symptoms on Flomax and incomplete emptying.  PVRs have been elevated >224m.  He has a long history of urinary urgency, frequency, feeling of incomplete emptying, weak stream, and nocturia.  [facility] and was found to have a 136 g prostate.  He takes Coumadin for distant history of PE.  Notably he also has significant nocturia and is noncompliant with CPAP.   PMH: Past Medical History:  Diagnosis Date   Acquired dilation of ascending aorta and aortic root ([facility]    a.) measurements by TTE in 04/2021 --> root 40 mm and ascending aorta 43 mm   Anginal pain (HCC)    Anxiety    Atrial flutter ([facility] 04/22/2021   a.) CHADS2VASc = 5 (age x 2, HTN, T2DM, CAD); b.) daily warfarin   Carotid disease, bilateral ([facility]    a.) Doppler in 2019 --> 1-39% stenosis on RIGHT and 40-58% on LEFT   Cataracts, bilateral    Chronic anticoagulation    Warfarin   Chronic renal insufficiency    Coronary artery disease    a.) s/p 3v CABG in 1999   Depression    GERD (gastroesophageal reflux disease)    History of kidney stones    Hypercholesteremia    Hypertension    Kidney stone    Low testosterone    on TRT injections   Malignant melanoma ([facility]    a.) face, nose, left leg; b.) [facility]  [facility] ([facility]    Pulmonary emboli ([facility]    S/P CABG x 3 1999   a.) LIMA-LAD, SVG-RCA, SVG-diagnonal   Sleep apnea    not using nocturnal PAP therapy   T2DM (type 2 diabetes mellitus) ([facility]     Surgical History: Past Surgical History:  Procedure Laterality Date   CARDIAC CATHETERIZATION     x3     last 1999   CATARACT EXTRACTION Bilateral    COLONOSCOPY WITH PROPOFOL N/A 09/26/2014   Procedure: COLONOSCOPY WITH PROPOFOL;  Surgeon: MGarlan Fair MD;  Location: [facility];   Service: Endoscopy;  Laterality: N/A;   CORONARY ARTERY BYPASS GRAFT N/A 1999   3v; LIMA-LAD, SVG-RCA, SVG-diagonal   KNEE ARTHROSCOPY Left    LAPAROSCOPIC CHOLECYSTECTOMY     MASS EXCISION  04/15/2012   Procedure: EXCISION MASS;  Surgeon: JMelissa Montane MD;  [place] [facility]  Service: ENT;  Laterality: Left;  Left tonsil biopsy   melanomia     several skin ca removed-nose, left ankle, parotid gland left, right nodes-being followed annually..     Family History: No family history on file.  Social History:  reports that he has never smoked. He has never used smokeless tobacco. He reports that he does not drink alcohol and does not use drugs.  Physical Exam: BP (!) 183/86   Pulse 93   Temp (!) 96.9 F (36.1 C) (Temporal)   Resp 14   Wt 103.6 kg   SpO2 99%   BMI 32.77 kg/m    Constitutional:  Alert and oriented, No acute distress. Cardiovascular: irregularly irregualr Respiratory: CTA bilaterally. GI: Abdomen is soft, nontender, nondistended, no abdominal masses  Assessment & Plan:   75year old male with BPH and obstructive symptoms and 136g prostate. Non-compliance with sleep apnea likely also contributing to nocturia.  We discussed the risks  and benefits of HoLEP at length.  The procedure requires general anesthesia and takes 1 to 2 hours, and a holmium laser is used to enucleate the prostate and push this tissue into the bladder.  A morcellator is then used to remove this tissue, which is sent for pathology.  The vast majority(>95%) of patients are able to discharge the same day with a catheter in place for 2 to 3 days, and will follow-up in clinic for a voiding trial.  We specifically discussed the risks of bleeding, infection, retrograde ejaculation, temporary urgency and urge incontinence, very low risk of long-term incontinence, urethral stricture/bladder neck contracture, pathologic evaluation of prostate tissue and possible detection of prostate cancer or other malignancy, and  possible need for additional procedures.  HoLEP today  Nickolas Madrid, MD 06/07/2021  [facility] [address] [place], [place] [address] [phone]

## 2021-06-07 NOTE — Anesthesia Preprocedure Evaluation (Addendum)
Anesthesia Evaluation  Patient identified by MRN, date of birth, ID band Patient awake    Reviewed: Allergy & Precautions, H&P , NPO status , Patient's Chart, lab work & pertinent test results, reviewed documented beta blocker date and time   Airway Mallampati: III  TM Distance: >3 FB Neck ROM: full    Dental  (+) Dental Advisory Given, Teeth Intact Small chip left upper front:   Pulmonary sleep apnea , PE H/o PE on chronic anticoagulation   Pulmonary exam normal breath sounds clear to auscultation       Cardiovascular Exercise Tolerance: Good hypertension, Pt. on medications and Pt. on home beta blockers + CAD, + Past MI, + Cardiac Stents and + CABG (1999 x3)  Normal cardiovascular exam+ dysrhythmias (03/2021 on warfarin for PE. Pt will has cardioversion after procedure per cardiology) Atrial Fibrillation + Valvular Problems/Murmurs  Rhythm:Irregular Rate:Normal  05/13/2021 ECHO: 1. Left ventricular ejection fraction, by estimation, is 55 to 60%. The  left ventricle has normal function. The left ventricle has no regional  wall motion abnormalities. The left ventricular internal cavity size was  mildly dilated. There is mild left  ventricular hypertrophy. Left ventricular diastolic parameters are  indeterminate.  2. Right ventricular systolic function is normal. The right ventricular  size is normal. There is normal pulmonary artery systolic pressure.  3. Left atrial size was mild to moderately dilated.  4. The mitral valve is normal in structure. Trivial mitral valve  regurgitation.  5. The aortic valve is tricuspid. Aortic valve regurgitation is not  visualized. Mild aortic valve sclerosis is present, with no evidence of  aortic valve stenosis.  6. Aortic dilatation noted. There is mild dilatation of the aortic root,  measuring 40 mm. There is mild dilatation of the ascending aorta,  measuring 43 mm.  7. The inferior  vena cava is normal in size with greater than 50%  respiratory variability, suggesting right atrial pressure of 3 mmHg.   Aortic dilation    Cardiac catheterization showing occluded diagonal graft, occluded right coronary artery graft, and patent LIMA to LAD. Stress test 2010 with no evidence of ischemia.   Neuro/Psych Depression Carotid artery disease negative neurological ROS     GI/Hepatic Neg liver ROS, GERD  Medicated and Controlled,  Endo/Other  diabetes, Well Controlled, Type 2, Oral Hypoglycemic Agents  Renal/GU CRFRenal disease   BPH with obstruction    Musculoskeletal negative musculoskeletal ROS (+)   Abdominal (+) + obese,   Peds  Hematology negative hematology ROS (+)   Anesthesia Other Findings Obesity  Reproductive/Obstetrics negative OB ROS                            Anesthesia Physical  Anesthesia Plan  ASA: III  Anesthesia Plan: General   Post-op Pain Management:    Induction:   PONV Risk Score and Plan: 2 and Ondansetron and Dexamethasone  Airway Management Planned: Oral ETT  Additional Equipment:   Intra-op Plan:   Post-operative Plan: Extubation in OR  Informed Consent: I have reviewed the patients History and Physical, chart, labs and discussed the procedure including the risks, benefits and alternatives for the proposed anesthesia with the patient or authorized representative who has indicated his/her understanding and acceptance.     Dental Advisory Given  Plan Discussed with: CRNA, Surgeon and Anesthesiologist  Anesthesia Plan Comments:        Anesthesia Quick Evaluation

## 2021-06-10 ENCOUNTER — Ambulatory Visit: Payer: Medicare Other

## 2021-06-10 ENCOUNTER — Ambulatory Visit (INDEPENDENT_AMBULATORY_CARE_PROVIDER_SITE_OTHER): Payer: Medicare Other | Admitting: Urology

## 2021-06-10 ENCOUNTER — Other Ambulatory Visit: Payer: Self-pay

## 2021-06-10 DIAGNOSIS — R3914 Feeling of incomplete bladder emptying: Secondary | ICD-10-CM

## 2021-06-10 DIAGNOSIS — N401 Enlarged prostate with lower urinary tract symptoms: Secondary | ICD-10-CM

## 2021-06-10 LAB — BLADDER SCAN AMB NON-IMAGING: Scan Result: 1

## 2021-06-10 LAB — SURGICAL PATHOLOGY

## 2021-06-10 NOTE — Progress Notes (Signed)
Catheter Removal  Patient is present today for a catheter removal.  25m of water was drained from the balloon. A 24FR foley cath was removed from the bladder no complications were noted . Patient tolerated well.  Performed by: CElberta Leatherwood CMA  Follow up/ Additional notes: this afternoon for a PVR    Patient returned for PVR. Bladder Scan Patient can void  residual is 110mPerformed By: CaElberta LeatherwoodCMLimonPer Dr. SnDiamantina Providenceatient is to return in 2 months with PVR. Appointment has been scheduled.

## 2021-06-11 ENCOUNTER — Telehealth: Payer: Self-pay

## 2021-06-11 NOTE — Telephone Encounter (Signed)
Called pt informed him of the information below. Pt gave verbal understanding.  

## 2021-06-11 NOTE — Telephone Encounter (Signed)
-----   Message from Billey Co, MD sent at 06/10/2021  4:45 PM EDT ----- Good news, only benign tissue seen on HOLEP, keep follow-up as scheduled  Nickolas Madrid, MD 06/10/2021

## 2021-06-28 ENCOUNTER — Encounter: Payer: Self-pay | Admitting: Cardiology

## 2021-06-28 ENCOUNTER — Ambulatory Visit (INDEPENDENT_AMBULATORY_CARE_PROVIDER_SITE_OTHER): Payer: 59 | Admitting: Cardiology

## 2021-06-28 ENCOUNTER — Other Ambulatory Visit: Payer: Self-pay

## 2021-06-28 DIAGNOSIS — Z7901 Long term (current) use of anticoagulants: Secondary | ICD-10-CM | POA: Diagnosis not present

## 2021-06-28 DIAGNOSIS — I483 Typical atrial flutter: Secondary | ICD-10-CM | POA: Diagnosis not present

## 2021-06-28 DIAGNOSIS — E7800 Pure hypercholesterolemia, unspecified: Secondary | ICD-10-CM | POA: Diagnosis not present

## 2021-06-28 DIAGNOSIS — I10 Essential (primary) hypertension: Secondary | ICD-10-CM

## 2021-06-28 DIAGNOSIS — I779 Disorder of arteries and arterioles, unspecified: Secondary | ICD-10-CM | POA: Diagnosis not present

## 2021-06-28 DIAGNOSIS — I2510 Atherosclerotic heart disease of native coronary artery without angina pectoris: Secondary | ICD-10-CM | POA: Diagnosis not present

## 2021-06-28 DIAGNOSIS — I208 Other forms of angina pectoris: Secondary | ICD-10-CM

## 2021-06-28 NOTE — Progress Notes (Signed)
Cardiology Office Note:    Date:  06/30/2021   ID:  KEYMANI MCLEAN, DOB 12/29/1945, MRN [id number]  PCP:  Josetta Huddle, MD  [facility] Cardiologist:  Candee Furbish, MD  [facility] Electrophysiologist:  None   Referring MD: Josetta Huddle, MD    History of Present Illness:    Evan [name] is a 75 y.o. male here for the follow-up of coronary artery disease, hypertension, and hyperlipidemia.  Hx of coronary artery disease post CABG 1999, cardiac cath 2000 showing occluded diagonal graft, occluded right coronary artery graft but patent LIMA to LAD.  2010 stress test did not show any evidence of ischemia.  Had tried some Ranexa for a while given angina but this was stopped.  Doing well without it.  Has a prior history of PE, taking Coumadin.  Vascular surgery, Dr. Doren Custard has been watching his carotid arteries.  Still works at the [employer].  Today: Overall he believes he is feeling well. Since his last visit he underwent a Holep-laser enucleation of the prostate (06/07/2021), and has recovered well.  Since amlodipine was discontinued, his blood pressure has remained stable and well-controlled.  He denies any palpitations, chest pain, or shortness of breath. No lightheadedness, headaches, syncope, orthopnea, or PND. Also has no lower extremity edema or exertional symptoms.   Past Medical History:  Diagnosis Date   Acquired dilation of ascending aorta and aortic root ([place])    a.) measurements by TTE in 04/2021 --> root 40 mm and ascending aorta 43 mm   Anginal pain (HCC)    Anxiety    Atrial flutter ([place]) 04/22/2021   a.) CHADS2VASc = 5 (age x 2, HTN, T2DM, CAD); b.) daily warfarin   Carotid disease, bilateral ([place])    a.) Doppler in 2019 --> 1-39% stenosis on RIGHT and 40-58% on LEFT   Cataracts, bilateral    Chronic anticoagulation    Warfarin   Chronic renal insufficiency    Coronary artery disease    a.) s/p 3v CABG in 1999   Depression    GERD  (gastroesophageal reflux disease)    History of kidney stones    Hypercholesteremia    Hypertension    Kidney stone    Low testosterone    on TRT injections   Malignant melanoma ([place])    a.) face, nose, left leg; b.) Tx'd with chemotherapy + [facility] at [facility]   Myocardial infarction [facility])    Pulmonary emboli ([place])    S/P CABG x 3 1999   a.) LIMA-LAD, SVG-RCA, SVG-diagnonal   Sleep apnea    not using nocturnal PAP therapy   T2DM (type 2 diabetes mellitus) ([place])     Past Surgical History:  Procedure Laterality Date   CARDIAC CATHETERIZATION     x3     last 1999   CATARACT EXTRACTION Bilateral    COLONOSCOPY WITH PROPOFOL N/A 09/26/2014   Procedure: COLONOSCOPY WITH PROPOFOL;  Surgeon: [name], MD;  Location: [facility];  Service: Endoscopy;  Laterality: N/A;   CORONARY ARTERY BYPASS GRAFT N/A 1999   3v; LIMA-LAD, SVG-RCA, SVG-diagonal   HOLEP-LASER ENUCLEATION OF THE PROSTATE WITH MORCELLATION N/A 06/07/2021   Procedure: HOLEP-LASER ENUCLEATION OF THE PROSTATE WITH MORCELLATION;  Surgeon: Billey Co, MD;  Location: [facility] ORS;  Service: Urology;  Laterality: N/A;   KNEE ARTHROSCOPY Left    LAPAROSCOPIC CHOLECYSTECTOMY     MASS EXCISION  04/15/2012   Procedure: EXCISION MASS;  Surgeon: [name], MD;  Location: [facility];  Service: ENT;  Laterality: Left;  Left tonsil biopsy   melanomia     several skin ca removed-nose, left ankle, parotid gland left, right nodes-being followed annually..    Current Medications: Current Meds  Medication Sig   ANUCORT-HC 25 MG suppository Place 25 mg rectally daily as needed.   Bromfenac Sodium (PROLENSA) 0.07 % SOLN    cholecalciferol (VITAMIN D) 1000 UNITS tablet Take 2,000 Units by mouth every morning.    citalopram (CELEXA) 40 MG tablet Take 40 mg by mouth every morning.   co-enzyme Q-10 50 MG capsule Take 100 mg by mouth every morning.   Cranberry 500 MG CAPS Take 500 mg by mouth daily.   Difluprednate 0.05 % EMUL    fenofibrate  micronized (LOFIBRA) 67 MG capsule TAKE 1 CAPSULE DAILY   FLAXSEED, LINSEED, PO Take 2 tablets by mouth 2 (two) times daily.   fluticasone (CUTIVATE) 0.05 % cream Apply topically 2 (two) times daily as needed.   folic acid (FOLVITE) 528 MCG tablet Take 400 mcg by mouth daily.   gabapentin (NEURONTIN) 300 MG capsule Take 1 capsule by mouth daily.   glyBURIDE (DIABETA) 5 MG tablet Take 5 mg by mouth every evening.   ketoconazole (NIZORAL) 2 % shampoo ketoconazole 2 % shampoo  WASH SCALP, CHEST, AND EARS 2 TO 3 TIMES A WEEK   metFORMIN (GLUCOPHAGE-XR) 500 MG 24 hr tablet Take 1,000 mg by mouth 2 (two) times daily.   metoprolol succinate (TOPROL-XL) 50 MG 24 hr tablet Take 1 tablet (50 mg total) by mouth daily. With or immediately following a meal. Please keep upcoming appt with Dr. Marlou Porch in September 2022 before anymore refills. Thank you   Multiple Vitamin (MULITIVITAMIN WITH MINERALS) TABS Take 1 tablet by mouth every evening.   nitroGLYCERIN (NITROSTAT) 0.4 MG SL tablet Place under the tongue.   omega-3 acid ethyl esters (LOVAZA) 1 g capsule TAKE 2 CAPSULES TWICE A DAY   pantoprazole (PROTONIX) 40 MG tablet Take 40 mg by mouth daily.   pioglitazone (ACTOS) 30 MG tablet Take 30 mg by mouth every morning.   potassium gluconate 595 MG TABS tablet Take 90 mg by mouth daily.    rosuvastatin (CRESTOR) 20 MG tablet Take 20 mg by mouth every evening.   testosterone cypionate (DEPOTESTOTERONE CYPIONATE) 100 MG/ML injection Inject 150 mg into the muscle every 14 (fourteen) days. For IM use only   triamcinolone lotion (KENALOG) 0.1 % triamcinolone acetonide 0.1 % lotion  APPLY TO AFFECTED AREA TWICE A DAY AS NEEDED FOR FLARE/ITCH   valsartan (DIOVAN) 160 MG tablet Take 80 mg by mouth every morning.   vitamin C (ASCORBIC ACID) 500 MG tablet Take 1,000 mg by mouth every morning.   warfarin (COUMADIN) 5 MG tablet Take 5 mg by mouth every evening. As directed--1/2 tab every Monday, Wednesday, and Friday; 1  tab all other days     Allergies:   Doxycycline and Penicillins   Social History   Socioeconomic History   Marital status: Married    Spouse name: Not on file   Number of children: Not on file   Years of education: Not on file   Highest education level: Not on file  Occupational History   Not on file  Tobacco Use   Smoking status: Never   Smokeless tobacco: Never  Substance and Sexual Activity   Alcohol use: No   Drug use: No   Sexual activity: Not on file  Other Topics Concern   Not on file  Social History Narrative   Not on file   Social Determinants of Health   Financial Resource Strain: Not on file  Food Insecurity: Not on file  Transportation Needs: Not on file  Physical Activity: Not on file  Stress: Not on file  Social Connections: Not on file      ROS:   Please see the history of present illness.    All other systems reviewed and are negative.  EKGs/Labs/Other Studies Reviewed:    The following studies were reviewed today:  Echo 05/13/2021: 1. Left ventricular ejection fraction, by estimation, is 55 to 60%. The  left ventricle has normal function. The left ventricle has no regional  wall motion abnormalities. The left ventricular internal cavity size was  mildly dilated. There is mild left  ventricular hypertrophy. Left ventricular diastolic parameters are  indeterminate.   2. Right ventricular systolic function is normal. The right ventricular  size is normal. There is normal pulmonary artery systolic pressure.   3. Left atrial size was mild to moderately dilated.   4. The mitral valve is normal in structure. Trivial mitral valve  regurgitation.   5. The aortic valve is tricuspid. Aortic valve regurgitation is not  visualized. Mild aortic valve sclerosis is present, with no evidence of  aortic valve stenosis.   6. Aortic dilatation noted. There is mild dilatation of the aortic root,  measuring 40 mm. There is mild dilatation of the ascending  aorta,  measuring 43 mm.   7. The inferior vena cava is normal in size with greater than 50%  respiratory variability, suggesting right atrial pressure of 3 mmHg.   Comparison(s): The left ventricular function is unchanged.  Carotid Duplex 05/17/2018: Final Interpretation:  Right Carotid: Velocities in the right ICA are consistent with a 1-39%  stenosis. Non-hemodynamically significant plaque <50% noted in the  CCA.   Left Carotid: Velocities in the left ICA are consistent with a 40-59%  stenosis. Non-hemodynamically significant plaque noted in the CCA.   Vertebrals:  Bilateral vertebral arteries demonstrate antegrade flow.  Subclavians: Normal flow hemodynamics were seen in bilateral subclavian               arteries.   ECHO 02/27/15 - Left ventricle: The cavity size was mildly dilated. Wall   thickness was normal. Systolic function was normal. The estimated   ejection fraction was in the range of 55% to 60%. Wall motion was   normal; there were no regional wall motion abnormalities.    EKG:   EKG is personally reviewed and interpreted. 06/28/2021: Atrial flutter. Rate 68 bpm. 03/05/2020: sinus bradycardia 52 no other abnormalities  Recent Labs: 06/07/2021: BUN 41; Creatinine, Ser 2.10; Potassium 5.2; Sodium 137   Recent Lipid Panel No results found for: CHOL, TRIG, HDL, CHOLHDL, VLDL, LDLCALC, LDLDIRECT  Physical Exam:    VS:  BP 120/60 (BP Location: Left Arm, Patient Position: Sitting, Cuff Size: Normal)   Pulse 65   Ht 5\' 10"  (1.778 m)   Wt 222 lb (100.7 kg)   SpO2 98%   BMI 31.85 kg/m     Wt Readings from Last 3 Encounters:  06/28/21 222 lb (100.7 kg)  06/07/21 228 lb 6.4 oz (103.6 kg)  05/31/21 228 lb 6.4 oz (103.6 kg)     GEN: Well nourished, well developed in no acute distress HEENT: Normal NECK: No JVD; No carotid bruits LYMPHATICS: No lymphadenopathy CARDIAC: RRR, no murmurs, rubs, gallops RESPIRATORY:  Clear to auscultation without rales, wheezing or  rhonchi  ABDOMEN: Soft, non-tender, non-distended MUSCULOSKELETAL:  No edema; No deformity  SKIN: Warm and dry NEUROLOGIC:  Alert and oriented x 3 PSYCHIATRIC:  Normal affect     ASSESSMENT:    1. Typical atrial flutter ([place])   2. Coronary artery disease involving native coronary artery of native heart without angina pectoris   3. Primary hypertension   4. Pure hypercholesterolemia   5. Chronic anticoagulation   6. Carotid artery disease, unspecified laterality, unspecified type ([place])     PLAN:    In order of problems listed above: Typical atrial flutter ([place]) We will go ahead and set him up for a cardioversion for 1 month from now.  He recently had his prostate procedure.  He needs to have 3 weeks of uninterrupted Coumadin.  INR 2.0.  I am projecting that that will be the case.  Heart rate is currently 68 bpm.  Previously in sinus rhythm he was in the mid 53s.  EKG in June noted atrial flutter.  Prior to that was sinus.  Coronary artery disease - Patent LIMA to LAD, occluded diagonal and RCA vein graft.  Normal EF.  No anginal symptoms.  Used to be on Ranexa at one point but now is off.  Be doing quite well with aggressive secondary prevention. He is off of amlodipine.  Doing well.  This was stopped because of relatively low blood pressure prior to his prostate surgery.  Hypertension Blood pressure okay.  Amlodipine off.  Pure hypercholesterolemia  Fibrate, fish oil (Lovaza), Crestor 20.  LDL goal less than 70.  Last LDL from outside labs was 57, HDL 34 hemoglobin A1c 6.5 hemoglobin 17.5 creatinine 1.8  Chronic anticoagulation  Coumadin for prior PE. Coumadin is checked at Dr. Inda Merlin.   No bleeding issues.  Carotid artery disease Dr. Scot Dock.  Note, has had prior lymph node resection and neck which could possibly complicate carotid endarterectomy if this is needed. 2019 carotid study: Right Carotid: Velocities in the right ICA are consistent with a 1-39%  stenosis.                  Non-hemodynamically significant plaque <50% noted in the  CCA.   Left Carotid: Velocities in the left ICA are consistent with a 40-59%  stenosis.                Non-hemodynamically significant plaque noted in the CCA.   Vertebrals:  Bilateral vertebral arteries demonstrate antegrade flow.  Subclavians: Normal flow hemodynamics were seen in bilateral subclavian               arteries.   He states he will give his office a call to get another appointment set up.   Follow-up: 12 months.  Medication Adjustments/Labs and Tests Ordered: Current medicines are reviewed at length with the patient today.  Concerns regarding medicines are outlined above.   No orders of the defined types were placed in this encounter.  No orders of the defined types were placed in this encounter.  Patient Instructions  Medication Instructions:  The current medical regimen is effective;  continue present plan and medications.  *If you need a refill on your cardiac medications before your next appointment, please call your pharmacy*  Lab Work: You will need weekly INRs and also BMP, CBC before your cardioversion.  If you have labs (blood work) drawn today and your tests are completely normal, you will receive your results only by: [place] (if you have MyChart) OR A paper copy in  the mail If you have any lab test that is abnormal or we need to change your treatment, we will call you to review the results.  Testing/Procedures: You will be scheduled for an outpatient cardioversion once we have 3 weekly INRs with in range.  Your physician has requested that you have a Cardioversion. Electrical Cardioversion uses a jolt of electricity to your heart either through paddles or wired patches attached to your chest. This is a controlled, usually prescheduled, procedure. This procedure is done at the hospital and you are not awake during the procedure. You usually go home the day of the procedure. Please  see the instruction sheet given to you today for more information.  Follow-Up: At [facility], you and your health needs are our priority.  As part of our continuing mission to provide you with exceptional heart care, we have created designated Provider Care Teams.  These Care Teams include your primary Cardiologist (physician) and Advanced Practice Providers (APPs -  Physician Assistants and Nurse Practitioners) who all work together to provide you with the care you need, when you need it.  We recommend signing up for the patient portal called "MyChart".  Sign up information is provided on this After Visit Summary.  MyChart is used to connect with patients for Virtual Visits (Telemedicine).  Patients are able to view lab/test results, encounter notes, upcoming appointments, etc.  Non-urgent messages can be sent to your provider as well.   To learn more about what you can do with MyChart, go to [email or web address].    Your next appointment:   2 week(s) after your cardioverion  The format for your next appointment:   In Person  Provider:   You will follow up in the [facility] located at [facility]. Your provider will be: Roderic Palau, NP or Clint R. Fenton, PA-C    Thank you for choosing [facility]!!     I,Mathew Stumpf,acting as a scribe for [employer], MD.,have documented all relevant documentation on the behalf of Candee Furbish, MD,as directed by  Candee Furbish, MD while in the presence of Candee Furbish, MD.  I, Candee Furbish, MD, have reviewed all documentation for this visit. The documentation on 06/30/21 for the exam, diagnosis, procedures, and orders are all accurate and complete.   Signed, Candee Furbish, MD  06/30/2021 2:16 PM    [facility]

## 2021-06-28 NOTE — Assessment & Plan Note (Signed)
We will go ahead and set him up for a cardioversion for 1 month from now.  He recently had his prostate procedure.  He needs to have 3 weeks of uninterrupted Coumadin.  INR 2.0.  I am projecting that that will be the case.  Heart rate is currently 68 bpm.  Previously in sinus rhythm he was in the mid 23s.  EKG in June noted atrial flutter.  Prior to that was sinus.

## 2021-06-28 NOTE — Assessment & Plan Note (Signed)
-  Patent LIMA to LAD, occluded diagonal and RCA vein graft. Normal EF. No anginal symptoms. Used to be on Ranexa at one point but now is off.  Be doing quite well with aggressive secondary prevention. He is off of amlodipine.  Doing well.  This was stopped because of relatively low blood pressure prior to his prostate surgery.

## 2021-06-28 NOTE — Assessment & Plan Note (Signed)
Blood pressure okay.  Amlodipine off.

## 2021-06-28 NOTE — Patient Instructions (Signed)
Medication Instructions:  The current medical regimen is effective;  continue present plan and medications.  *If you need a refill on your cardiac medications before your next appointment, please call your pharmacy*  Lab Work: You will need weekly INRs and also BMP, CBC before your cardioversion.  If you have labs (blood work) drawn today and your tests are completely normal, you will receive your results only by: [place] (if you have MyChart) OR A paper copy in the mail If you have any lab test that is abnormal or we need to change your treatment, we will call you to review the results.  Testing/Procedures: You will be scheduled for an outpatient cardioversion once we have 3 weekly INRs with in range.  Your physician has requested that you have a Cardioversion. Electrical Cardioversion uses a jolt of electricity to your heart either through paddles or wired patches attached to your chest. This is a controlled, usually prescheduled, procedure. This procedure is done at the hospital and you are not awake during the procedure. You usually go home the day of the procedure. Please see the instruction sheet given to you today for more information.  Follow-Up: At [facility], you and your health needs are our priority.  As part of our continuing mission to provide you with exceptional heart care, we have created designated Provider Care Teams.  These Care Teams include your primary Cardiologist (physician) and Advanced Practice Providers (APPs -  Physician Assistants and Nurse Practitioners) who all work together to provide you with the care you need, when you need it.  We recommend signing up for the patient portal called "MyChart".  Sign up information is provided on this After Visit Summary.  MyChart is used to connect with patients for Virtual Visits (Telemedicine).  Patients are able to view lab/test results, encounter notes, upcoming appointments, etc.  Non-urgent messages can be sent  to your provider as well.   To learn more about what you can do with MyChart, go to [email or web address].    Your next appointment:   2 week(s) after your cardioverion  The format for your next appointment:   In Person  Provider:   You will follow up in the [facility] located at [facility]. Your provider will be: Roderic Palau, NP or Clint R. Fenton, PA-C    Thank you for choosing [facility]!!

## 2021-06-30 DIAGNOSIS — Z7901 Long term (current) use of anticoagulants: Secondary | ICD-10-CM | POA: Insufficient documentation

## 2021-06-30 NOTE — Assessment & Plan Note (Signed)
Fibrate, fish oil (Lovaza), Crestor 20.  LDL goal less than 70.  Last LDL from outside labs was 57, HDL 34 hemoglobin A1c 6.5 hemoglobin 17.5 creatinine 1.8

## 2021-06-30 NOTE — Assessment & Plan Note (Signed)
Coumadin for prior PE. Coumadin is checked at Dr. Inda Merlin.   No bleeding issues.

## 2021-06-30 NOTE — Assessment & Plan Note (Signed)
Dr. Scot Dock.  Note, has had prior lymph node resection and neck which could possibly complicate carotid endarterectomy if this is needed. 2019 carotid study: Right Carotid: Velocities in the right ICA are consistent with a 1-39%  stenosis.                 Non-hemodynamically significant plaque <50% noted in the  CCA.   Left Carotid: Velocities in the left ICA are consistent with a 40-59%  stenosis.                Non-hemodynamically significant plaque noted in the CCA.   Vertebrals:  Bilateral vertebral arteries demonstrate antegrade flow.  Subclavians: Normal flow hemodynamics were seen in bilateral subclavian               arteries.   He states he will give his office a call to get another appointment set up.

## 2021-07-01 ENCOUNTER — Telehealth: Payer: Self-pay | Admitting: *Deleted

## 2021-07-01 NOTE — Telephone Encounter (Signed)
Called and spoke with Dr Inda Merlin' medical assistant to advise pt is in need of outpt cardioversion and we need 3 consecutive weeks of INRs within range in order to schedule him for procedure.  MA states they will contact patient to schedule him for the weekly appointments.

## 2021-07-08 DIAGNOSIS — Z7901 Long term (current) use of anticoagulants: Secondary | ICD-10-CM | POA: Diagnosis not present

## 2021-07-10 NOTE — Addendum Note (Signed)
Addended by: Maren Beach, Chereese Cilento A on: 07/10/2021 07:29 AM   Modules accepted: Orders

## 2021-07-12 NOTE — Telephone Encounter (Signed)
Called and spoke with pt who is unsure of the date of his last INR but the reading was 2.9 per his report.  He is scheduled again for Monday. Called and left message for Dr Inda Merlin' MA to call back to discuss pt's recent results and next appt. Or to fax results to Dr Marlou Porch.

## 2021-07-15 DIAGNOSIS — Z86711 Personal history of pulmonary embolism: Secondary | ICD-10-CM | POA: Diagnosis not present

## 2021-07-15 DIAGNOSIS — E291 Testicular hypofunction: Secondary | ICD-10-CM | POA: Diagnosis not present

## 2021-07-15 DIAGNOSIS — Z23 Encounter for immunization: Secondary | ICD-10-CM | POA: Diagnosis not present

## 2021-07-15 DIAGNOSIS — I4891 Unspecified atrial fibrillation: Secondary | ICD-10-CM | POA: Diagnosis not present

## 2021-07-19 NOTE — Telephone Encounter (Signed)
Called tto Dr Inda Merlin office again requesting most recent INR results.  After being transferred multiple time, was able to speak with someone in MR who state she will fax all INR results since September.

## 2021-07-19 NOTE — Telephone Encounter (Signed)
Received INRs from Dr Inda Merlin office  06/04/2021 1.2  07/08/2021 2.9  07/15/2021 1.9  Will continue to monitor in order to be able to schedule pt for cardioversion.

## 2021-07-22 DIAGNOSIS — I4891 Unspecified atrial fibrillation: Secondary | ICD-10-CM | POA: Diagnosis not present

## 2021-07-22 DIAGNOSIS — G4733 Obstructive sleep apnea (adult) (pediatric): Secondary | ICD-10-CM | POA: Diagnosis not present

## 2021-07-22 DIAGNOSIS — Z86711 Personal history of pulmonary embolism: Secondary | ICD-10-CM | POA: Diagnosis not present

## 2021-07-26 ENCOUNTER — Other Ambulatory Visit (HOSPITAL_BASED_OUTPATIENT_CLINIC_OR_DEPARTMENT_OTHER): Payer: Self-pay

## 2021-07-26 DIAGNOSIS — R0683 Snoring: Secondary | ICD-10-CM

## 2021-07-26 DIAGNOSIS — G4710 Hypersomnia, unspecified: Secondary | ICD-10-CM

## 2021-07-26 DIAGNOSIS — R0681 Apnea, not elsewhere classified: Secondary | ICD-10-CM

## 2021-07-29 DIAGNOSIS — E291 Testicular hypofunction: Secondary | ICD-10-CM | POA: Diagnosis not present

## 2021-07-31 NOTE — Telephone Encounter (Signed)
Spoke with patient regarding most recent INRs.  Advised I am having a difficult time obtaining results from Dr Inda Merlin office.  I can see his INR on 10/31 was 3.7.  pt reports it was checked 10/24 and is to be rechecked 11/7.  Gave pt our fax number and requested he have them send the results on Monday while his is there.  Pt is in agreement.

## 2021-08-05 DIAGNOSIS — Z7901 Long term (current) use of anticoagulants: Secondary | ICD-10-CM | POA: Diagnosis not present

## 2021-08-05 LAB — PROTIME-INR: INR: 2.7 — AB (ref 0.9–1.1)

## 2021-08-07 NOTE — Telephone Encounter (Signed)
Received INRs from Dr Inda Merlin office 10/24  2.0 10/31  3.7 11/7  2.7  Pt has been scheduled for outpt cardioversion on Monday 11/14 with Dr Harrington Challenger at 1 pm. Pt coming into office on Friday 11/11 for EKG and updated H&P.  Will review all instructions at time of appt on 11/11.

## 2021-08-08 ENCOUNTER — Other Ambulatory Visit: Payer: Self-pay

## 2021-08-08 ENCOUNTER — Encounter: Payer: Self-pay | Admitting: Urology

## 2021-08-08 ENCOUNTER — Ambulatory Visit (INDEPENDENT_AMBULATORY_CARE_PROVIDER_SITE_OTHER): Payer: Medicare Other | Admitting: Urology

## 2021-08-08 VITALS — BP 105/61 | HR 67 | Ht 70.0 in | Wt 220.0 lb

## 2021-08-08 DIAGNOSIS — N401 Enlarged prostate with lower urinary tract symptoms: Secondary | ICD-10-CM

## 2021-08-08 DIAGNOSIS — N529 Male erectile dysfunction, unspecified: Secondary | ICD-10-CM

## 2021-08-08 DIAGNOSIS — R3914 Feeling of incomplete bladder emptying: Secondary | ICD-10-CM

## 2021-08-08 LAB — BLADDER SCAN AMB NON-IMAGING

## 2021-08-08 NOTE — Progress Notes (Signed)
   08/08/2021 1:47 PM   TRANELL WOJTKIEWICZ 30-Oct-1945 [id number]  Reason for visit: Follow up BPH status post HOLEP  HPI: 75 year old comorbid male who underwent a uncomplicated HOLEP on 06/05/9149 for BPH with elevated PVRs and obstructive urinary symptoms.  Pathology showed only benign prostate tissue.  He has been doing very well and urinating with a strong stream, PVR is normal at 0 mL.  He initially was having some stress incontinence, but that has almost completely resolved.  His nocturia has also almost completely resolved.  He has some trouble with erections, but is getting ready to undergo cardioversion, and also takes nitrates for chest pain.  I recommended holding off on any PDE 5 inhibitors for the time being, and we can re-address this in 6 months.  RTC 6 months PVR, discussed ED   Billey Co, MD  [facility] [address] [place], [place] [address] [phone]

## 2021-08-09 ENCOUNTER — Ambulatory Visit (INDEPENDENT_AMBULATORY_CARE_PROVIDER_SITE_OTHER): Payer: 59 | Admitting: Cardiology

## 2021-08-09 ENCOUNTER — Encounter: Payer: Self-pay | Admitting: Cardiology

## 2021-08-09 DIAGNOSIS — I208 Other forms of angina pectoris: Secondary | ICD-10-CM

## 2021-08-09 DIAGNOSIS — I2510 Atherosclerotic heart disease of native coronary artery without angina pectoris: Secondary | ICD-10-CM | POA: Diagnosis not present

## 2021-08-09 DIAGNOSIS — I483 Typical atrial flutter: Secondary | ICD-10-CM

## 2021-08-09 NOTE — Patient Instructions (Addendum)
Medication Instructions:  The current medical regimen is effective;  continue present plan and medications.  *If you need a refill on your cardiac medications before your next appointment, please call your pharmacy*  Lab Work: None today. If you have labs (blood work) drawn today and your tests are completely normal, you will receive your results only by: [place] (if you have MyChart) OR A paper copy in the mail If you have any lab test that is abnormal or we need to change your treatment, we will call you to review the results.  Testing/Procedures: Your physician has requested that you have a Cardioversion. Electrical Cardioversion uses a jolt of electricity to your heart either through paddles or wired patches attached to your chest. This is a controlled, usually prescheduled, procedure. This procedure is done at the hospital and you are not awake during the procedure. You usually go home the day of the procedure. Please see the instruction sheet given to you today for more information.  You are scheduled for a Cardioversion on Monday August 12, 2021 with Dr. Harrington Challenger.  Please arrive at the [facility] (Main Entrance A) at [facility]: [address] [place], [place] [address] at 1 N. (1 hour prior to procedure unless lab work is needed; if lab work is needed arrive 1.5 hours ahead)  DIET: Nothing to eat or drink after midnight except a sip of water with medications (see medication instructions below)  FYI: For your safety, and to allow [place] to monitor your vital signs accurately during the surgery/procedure we request that   if you have artificial nails, gel coating, SNS etc. Please have those removed prior to your surgery/procedure. Not having the nail coverings /polish removed may result in cancellation or delay of your surgery/procedure.  Medication Instructions: Hold any medication for DM this AM (Metformin, Actos, Glyburide) Continue your anticoagulant: Warfarin You will  need to continue your anticoagulant after your procedure until you  are told by your  Provider that it is safe to stop  You must have a responsible person to drive you home and stay in the waiting area during your procedure. Failure to do so could result in cancellation.  Bring your insurance cards.  *Special Note: Every effort is made to have your procedure done on time. Occasionally there are emergencies that occur at the hospital that may cause delays. Please be patient if a delay does occur.   Follow-Up: At [facility], you and your health needs are our priority.  As part of our continuing mission to provide you with exceptional heart care, we have created designated Provider Care Teams.  These Care Teams include your primary Cardiologist (physician) and Advanced Practice Providers (APPs -  Physician Assistants and Nurse Practitioners) who all work together to provide you with the care you need, when you need it.  We recommend signing up for the patient portal called "MyChart".  Sign up information is provided on this After Visit Summary.  MyChart is used to connect with patients for Virtual Visits (Telemedicine).  Patients are able to view lab/test results, encounter notes, upcoming appointments, etc.  Non-urgent messages can be sent to your provider as well.   To learn more about what you can do with MyChart, go to [email or web address].    Your next appointment:   1 month(s)  The format for your next appointment:   In Person  Provider:   You will follow up in the [facility] located at [facility]. Your  provider will be: Roderic Palau, NP or Clint R. Fenton, PA-C{   Thank you for choosing [facility]!!

## 2021-08-09 NOTE — Assessment & Plan Note (Signed)
Prior CABG, patent LIMA to LAD occluded diagonal and RCA vein graft.  Normal EF.  No anginal symptoms.  At 1 point he was taking Ranexa but he is now off.  Continue with aggressive secondary risk factor prevention.  Previously amlodipine was discontinued because of low pressure surrounding his prostate surgery.

## 2021-08-09 NOTE — Telephone Encounter (Signed)
Pt seen today for updated H&P - EKG.  Instructions for cardioversion reviewed.  Pt had no further questions/concerns at this time.

## 2021-08-09 NOTE — Progress Notes (Signed)
Cardiology Office Note:    Date:  08/09/2021   ID:  STEPHENS SHREVE, DOB 01/13/1946, MRN [id number]  PCP:  Josetta Huddle, MD  [facility] Cardiologist:  Candee Furbish, MD  [facility] Electrophysiologist:  None   Referring MD: Josetta Huddle, MD    History of Present Illness:    Evan [name] is a 75 y.o. male here for preop visit to cardioversion for typical atrial flutter and the follow-up of coronary artery disease, hypertension, and hyperlipidemia.  He has done well with Dr. Inda Merlin his INRs have been therapeutic with 3 separate week checks.  See below for picture from Dr. Inda Merlin lab.  Excellent.  Hx of coronary artery disease post CABG 1999, cardiac cath 2000 showing occluded diagonal graft, occluded right coronary artery graft but patent LIMA to LAD.  2010 stress test did not show any evidence of ischemia.  Had tried some Ranexa for a while given angina but this was stopped.  Doing well without it.  Has a prior history of PE, taking Coumadin.  Vascular surgery, Dr. Doren Custard has been watching his carotid arteries.  Still works at the [employer].  He underwent a Holep-laser enucleation of the prostate (06/07/2021), and has recovered well.  Since amlodipine was discontinued, his blood pressure has remained stable and well-controlled.  He denies any palpitations, chest pain, or shortness of breath. No lightheadedness, headaches, syncope, orthopnea, or PND. Also has no lower extremity edema or exertional symptoms.   Past Medical History:  Diagnosis Date   Acquired dilation of ascending aorta and aortic root ([place])    a.) measurements by TTE in 04/2021 --> root 40 mm and ascending aorta 43 mm   Anginal pain (HCC)    Anxiety    Atrial flutter ([place]) 04/22/2021   a.) CHADS2VASc = 5 (age x 2, HTN, T2DM, CAD); b.) daily warfarin   Carotid disease, bilateral ([place])    a.) Doppler in 2019 --> 1-39% stenosis on RIGHT and 40-58% on LEFT   Cataracts, bilateral    Chronic  anticoagulation    Warfarin   Chronic renal insufficiency    Coronary artery disease    a.) s/p 3v CABG in 1999   Depression    GERD (gastroesophageal reflux disease)    History of kidney stones    Hypercholesteremia    Hypertension    Kidney stone    Low testosterone    on TRT injections   Malignant melanoma ([place])    a.) face, nose, left leg; b.) Tx'd with chemotherapy + XRT at [facility]   Myocardial infarction [facility])    Pulmonary emboli ([place])    S/P CABG x 3 1999   a.) LIMA-LAD, SVG-RCA, SVG-diagnonal   Sleep apnea    not using nocturnal PAP therapy   T2DM (type 2 diabetes mellitus) ([place])     Past Surgical History:  Procedure Laterality Date   CARDIAC CATHETERIZATION     x3     last 1999   CATARACT EXTRACTION Bilateral    COLONOSCOPY WITH PROPOFOL N/A 09/26/2014   Procedure: COLONOSCOPY WITH PROPOFOL;  Surgeon: [name], MD;  Location: [facility];  Service: Endoscopy;  Laterality: N/A;   CORONARY ARTERY BYPASS GRAFT N/A 1999   3v; LIMA-LAD, SVG-RCA, SVG-diagonal   HOLEP-LASER ENUCLEATION OF THE PROSTATE WITH MORCELLATION N/A 06/07/2021   Procedure: HOLEP-LASER ENUCLEATION OF THE PROSTATE WITH MORCELLATION;  Surgeon: Billey Co, MD;  Location: [facility] ORS;  Service: Urology;  Laterality: N/A;   KNEE ARTHROSCOPY Left  LAPAROSCOPIC CHOLECYSTECTOMY     MASS EXCISION  04/15/2012   Procedure: EXCISION MASS;  Surgeon: [name], MD;  Location: [facility];  Service: ENT;  Laterality: Left;  Left tonsil biopsy   melanomia     several skin ca removed-nose, left ankle, parotid gland left, right nodes-being followed annually..    Current Medications: Current Meds  Medication Sig   ANUCORT-HC 25 MG suppository Place 25 mg rectally daily as needed.   Bromfenac Sodium (PROLENSA) 0.07 % SOLN    cholecalciferol (VITAMIN D) 1000 UNITS tablet Take 2,000 Units by mouth every morning.    citalopram (CELEXA) 40 MG tablet Take 40 mg by mouth every morning.   co-enzyme Q-10 50 MG  capsule Take 100 mg by mouth every morning.   Cranberry 500 MG CAPS Take 500 mg by mouth daily.   Difluprednate 0.05 % EMUL    fenofibrate micronized (LOFIBRA) 67 MG capsule TAKE 1 CAPSULE DAILY   FLAXSEED, LINSEED, PO Take 2 tablets by mouth 2 (two) times daily.   fluticasone (CUTIVATE) 0.05 % cream Apply topically 2 (two) times daily as needed.   folic acid (FOLVITE) 109 MCG tablet Take 400 mcg by mouth daily.   gabapentin (NEURONTIN) 300 MG capsule Take 1 capsule by mouth daily.   glyBURIDE (DIABETA) 5 MG tablet Take 5 mg by mouth every evening.   ketoconazole (NIZORAL) 2 % shampoo ketoconazole 2 % shampoo  WASH SCALP, CHEST, AND EARS 2 TO 3 TIMES A WEEK   metFORMIN (GLUCOPHAGE-XR) 500 MG 24 hr tablet Take 1,000 mg by mouth 2 (two) times daily.   metoprolol succinate (TOPROL-XL) 50 MG 24 hr tablet Take 1 tablet (50 mg total) by mouth daily. With or immediately following a meal. Please keep upcoming appt with Dr. Marlou Porch in September 2022 before anymore refills. Thank you   Multiple Vitamin (MULITIVITAMIN WITH MINERALS) TABS Take 1 tablet by mouth every evening.   nitroGLYCERIN (NITROSTAT) 0.4 MG SL tablet Place under the tongue.   omega-3 acid ethyl esters (LOVAZA) 1 g capsule TAKE 2 CAPSULES TWICE A DAY   pantoprazole (PROTONIX) 40 MG tablet Take 40 mg by mouth daily.   pioglitazone (ACTOS) 30 MG tablet Take 30 mg by mouth every morning.   potassium gluconate 595 MG TABS tablet Take 90 mg by mouth daily.    rosuvastatin (CRESTOR) 20 MG tablet Take 20 mg by mouth every evening.   testosterone cypionate (DEPOTESTOTERONE CYPIONATE) 100 MG/ML injection Inject 150 mg into the muscle every 14 (fourteen) days. For IM use only   triamcinolone lotion (KENALOG) 0.1 % triamcinolone acetonide 0.1 % lotion  APPLY TO AFFECTED AREA TWICE A DAY AS NEEDED FOR FLARE/ITCH   valsartan (DIOVAN) 160 MG tablet Take 80 mg by mouth every morning.   vitamin C (ASCORBIC ACID) 500 MG tablet Take 1,000 mg by mouth  every morning.   warfarin (COUMADIN) 5 MG tablet Take 5 mg by mouth every evening. As directed--1/2 tab every Monday, Wednesday, and Friday; 1 tab all other days     Allergies:   Doxycycline and Penicillins   Social History   Socioeconomic History   Marital status: Married    Spouse name: Not on file   Number of children: Not on file   Years of education: Not on file   Highest education level: Not on file  Occupational History   Not on file  Tobacco Use   Smoking status: Never   Smokeless tobacco: Never  Substance and Sexual Activity  Alcohol use: No   Drug use: No   Sexual activity: Not on file  Other Topics Concern   Not on file  Social History Narrative   Not on file   Social Determinants of Health   Financial Resource Strain: Not on file  Food Insecurity: Not on file  Transportation Needs: Not on file  Physical Activity: Not on file  Stress: Not on file  Social Connections: Not on file      ROS:   Please see the history of present illness.    All other systems reviewed and are negative.  EKGs/Labs/Other Studies Reviewed:    The following studies were reviewed today:  Echo 05/13/2021: 1. Left ventricular ejection fraction, by estimation, is 55 to 60%. The  left ventricle has normal function. The left ventricle has no regional  wall motion abnormalities. The left ventricular internal cavity size was  mildly dilated. There is mild left  ventricular hypertrophy. Left ventricular diastolic parameters are  indeterminate.   2. Right ventricular systolic function is normal. The right ventricular  size is normal. There is normal pulmonary artery systolic pressure.   3. Left atrial size was mild to moderately dilated.   4. The mitral valve is normal in structure. Trivial mitral valve  regurgitation.   5. The aortic valve is tricuspid. Aortic valve regurgitation is not  visualized. Mild aortic valve sclerosis is present, with no evidence of  aortic valve  stenosis.   6. Aortic dilatation noted. There is mild dilatation of the aortic root,  measuring 40 mm. There is mild dilatation of the ascending aorta,  measuring 43 mm.   7. The inferior vena cava is normal in size with greater than 50%  respiratory variability, suggesting right atrial pressure of 3 mmHg.   Comparison(s): The left ventricular function is unchanged.  Carotid Duplex 05/17/2018: Final Interpretation:  Right Carotid: Velocities in the right ICA are consistent with a 1-39%  stenosis. Non-hemodynamically significant plaque <50% noted in the  CCA.   Left Carotid: Velocities in the left ICA are consistent with a 40-59%  stenosis. Non-hemodynamically significant plaque noted in the CCA.   Vertebrals:  Bilateral vertebral arteries demonstrate antegrade flow.  Subclavians: Normal flow hemodynamics were seen in bilateral subclavian               arteries.   ECHO 02/27/15 - Left ventricle: The cavity size was mildly dilated. Wall   thickness was normal. Systolic function was normal. The estimated   ejection fraction was in the range of 55% to 60%. Wall motion was   normal; there were no regional wall motion abnormalities.    EKG:   EKG is personally reviewed and interpreted. 08/09/2021 atrial flutter 66 typical 06/28/2021: Atrial flutter. Rate 68 bpm. 03/05/2020: sinus bradycardia 52 no other abnormalities  Recent Labs: 06/07/2021: BUN 41; Creatinine, Ser 2.10; Potassium 5.2; Sodium 137   Recent Lipid Panel No results found for: CHOL, TRIG, HDL, CHOLHDL, VLDL, LDLCALC, LDLDIRECT     Physical Exam:    VS:  BP 100/60 (BP Location: Left Arm, Patient Position: Sitting, Cuff Size: Normal)   Pulse 66   Ht 5\' 10"  (1.778 m)   Wt 227 lb (103 kg)   SpO2 94%   BMI 32.57 kg/m     Wt Readings from Last 3 Encounters:  08/09/21 227 lb (103 kg)  08/08/21 220 lb (99.8 kg)  06/28/21 222 lb (100.7 kg)    GEN: Well nourished, well developed, in no acute distress HEENT:  normal Neck: no JVD, carotid bruits, or masses Cardiac: RRR; no murmurs, rubs, or gallops,no edema  Respiratory:  clear to auscultation bilaterally, normal work of breathing GI: soft, nontender, nondistended, + BS MS: no deformity or atrophy Skin: warm and dry, no rash Neuro:  Alert and Oriented x 3, Strength and sensation are intact Psych: euthymic mood, full affect     ASSESSMENT:    1. Typical atrial flutter ([place])   2. Coronary artery disease involving native coronary artery of native heart without angina pectoris      PLAN:    In order of problems listed above: Typical atrial flutter [facility]) Now has cardioversion set up.  He has had 3 successive INRs that are in range.  I took a picture of this and put it into my note.  Appreciate Dr. Geraldo Docker office helping [place] with this.  He is on warfarin.  Okay to proceed with cardioversion.  If atrial flutter were to return, we will send to EP for flutter ablation.  It is typical.  Coronary artery disease Prior CABG, patent LIMA to LAD occluded diagonal and RCA vein graft.  Normal EF.  No anginal symptoms.  At 1 point he was taking Ranexa but he is now off.  Continue with aggressive secondary risk factor prevention.  Previously amlodipine was discontinued because of low pressure surrounding his prostate surgery.   Follow-up: 12 months.  Medication Adjustments/Labs and Tests Ordered: Current medicines are reviewed at length with the patient today.  Concerns regarding medicines are outlined above.   Orders Placed This Encounter  Procedures   EKG 12-Lead    No orders of the defined types were placed in this encounter.  Patient Instructions  Medication Instructions:  The current medical regimen is effective;  continue present plan and medications.  *If you need a refill on your cardiac medications before your next appointment, please call your pharmacy*  Lab Work: None today. If you have labs (blood work) drawn today and your tests  are completely normal, you will receive your results only by: [place] (if you have MyChart) OR A paper copy in the mail If you have any lab test that is abnormal or we need to change your treatment, we will call you to review the results.  Testing/Procedures: Your physician has requested that you have a Cardioversion. Electrical Cardioversion uses a jolt of electricity to your heart either through paddles or wired patches attached to your chest. This is a controlled, usually prescheduled, procedure. This procedure is done at the hospital and you are not awake during the procedure. You usually go home the day of the procedure. Please see the instruction sheet given to you today for more information.  You are scheduled for a Cardioversion on Monday August 12, 2021 with Dr. Harrington Challenger.  Please arrive at the [facility] (Main Entrance A) at [facility]: [address] [place], [place] [address] at 14 N. (1 hour prior to procedure unless lab work is needed; if lab work is needed arrive 1.5 hours ahead)  DIET: Nothing to eat or drink after midnight except a sip of water with medications (see medication instructions below)  FYI: For your safety, and to allow [place] to monitor your vital signs accurately during the surgery/procedure we request that   if you have artificial nails, gel coating, SNS etc. Please have those removed prior to your surgery/procedure. Not having the nail coverings /polish removed may result in cancellation or delay of your surgery/procedure.  Medication Instructions:  Hold any medication for DM this AM (Metformin, Actos, Glyburide) Continue your anticoagulant: Warfarin You will need to continue your anticoagulant after your procedure until you  are told by your  Provider that it is safe to stop  You must have a responsible person to drive you home and stay in the waiting area during your procedure. Failure to do so could result in cancellation.  Bring your insurance  cards.  *Special Note: Every effort is made to have your procedure done on time. Occasionally there are emergencies that occur at the hospital that may cause delays. Please be patient if a delay does occur.   Follow-Up: At [facility], you and your health needs are our priority.  As part of our continuing mission to provide you with exceptional heart care, we have created designated Provider Care Teams.  These Care Teams include your primary Cardiologist (physician) and Advanced Practice Providers (APPs -  Physician Assistants and Nurse Practitioners) who all work together to provide you with the care you need, when you need it.  We recommend signing up for the patient portal called "MyChart".  Sign up information is provided on this After Visit Summary.  MyChart is used to connect with patients for Virtual Visits (Telemedicine).  Patients are able to view lab/test results, encounter notes, upcoming appointments, etc.  Non-urgent messages can be sent to your provider as well.   To learn more about what you can do with MyChart, go to [email or web address].    Your next appointment:   1 month(s)  The format for your next appointment:   In Person  Provider:   You will follow up in the [facility] located at [facility]. Your provider will be: Roderic Palau, NP or Clint R. Fenton, PA-C{   Thank you for choosing [facility]!!     I,Mathew Stumpf,acting as a scribe for [employer], MD.,have documented all relevant documentation on the behalf of Candee Furbish, MD,as directed by  Candee Furbish, MD while in the presence of Candee Furbish, MD.  I, Candee Furbish, MD, have reviewed all documentation for this visit. The documentation on 08/09/21 for the exam, diagnosis, procedures, and orders are all accurate and complete.   Signed, Candee Furbish, MD  08/09/2021 1:08 PM    [facility]

## 2021-08-09 NOTE — H&P (View-Only) (Signed)
Cardiology Office Note:    Date:  08/09/2021   ID:  Evan [name], DOB 11-08-1945, MRN [id number]  PCP:  Josetta Huddle, MD  [facility] Cardiologist:  Candee Furbish, MD  [facility] Electrophysiologist:  None   Referring MD: Josetta Huddle, MD    History of Present Illness:    Evan [name] is a 75 y.o. male here for preop visit to cardioversion for typical atrial flutter and the follow-up of coronary artery disease, hypertension, and hyperlipidemia.  He has done well with Dr. Inda Merlin his INRs have been therapeutic with 3 separate week checks.  See below for picture from Dr. Inda Merlin lab.  Excellent.  Hx of coronary artery disease post CABG 1999, cardiac cath 2000 showing occluded diagonal graft, occluded right coronary artery graft but patent LIMA to LAD.  2010 stress test did not show any evidence of ischemia.  Had tried some Ranexa for a while given angina but this was stopped.  Doing well without it.  Has a prior history of PE, taking Coumadin.  Vascular surgery, Dr. Doren Custard has been watching his carotid arteries.  Still works at the [employer].  He underwent a Holep-laser enucleation of the prostate (06/07/2021), and has recovered well.  Since amlodipine was discontinued, his blood pressure has remained stable and well-controlled.  He denies any palpitations, chest pain, or shortness of breath. No lightheadedness, headaches, syncope, orthopnea, or PND. Also has no lower extremity edema or exertional symptoms.   Past Medical History:  Diagnosis Date   Acquired dilation of ascending aorta and aortic root ([place])    a.) measurements by TTE in 04/2021 --> root 40 mm and ascending aorta 43 mm   Anginal pain (HCC)    Anxiety    Atrial flutter ([place]) 04/22/2021   a.) CHADS2VASc = 5 (age x 2, HTN, T2DM, CAD); b.) daily warfarin   Carotid disease, bilateral ([place])    a.) Doppler in 2019 --> 1-39% stenosis on RIGHT and 40-58% on LEFT   Cataracts, bilateral    Chronic  anticoagulation    Warfarin   Chronic renal insufficiency    Coronary artery disease    a.) s/p 3v CABG in 1999   Depression    GERD (gastroesophageal reflux disease)    History of kidney stones    Hypercholesteremia    Hypertension    Kidney stone    Low testosterone    on TRT injections   Malignant melanoma ([place])    a.) face, nose, left leg; b.) Tx'd with chemotherapy + XRT at [facility]   Myocardial infarction [facility])    Pulmonary emboli ([place])    S/P CABG x 3 1999   a.) LIMA-LAD, SVG-RCA, SVG-diagnonal   Sleep apnea    not using nocturnal PAP therapy   T2DM (type 2 diabetes mellitus) ([place])     Past Surgical History:  Procedure Laterality Date   CARDIAC CATHETERIZATION     x3     last 1999   CATARACT EXTRACTION Bilateral    COLONOSCOPY WITH PROPOFOL N/A 09/26/2014   Procedure: COLONOSCOPY WITH PROPOFOL;  Surgeon: [name], MD;  Location: [facility];  Service: Endoscopy;  Laterality: N/A;   CORONARY ARTERY BYPASS GRAFT N/A 1999   3v; LIMA-LAD, SVG-RCA, SVG-diagonal   HOLEP-LASER ENUCLEATION OF THE PROSTATE WITH MORCELLATION N/A 06/07/2021   Procedure: HOLEP-LASER ENUCLEATION OF THE PROSTATE WITH MORCELLATION;  Surgeon: Billey Co, MD;  Location: [facility] ORS;  Service: Urology;  Laterality: N/A;   KNEE ARTHROSCOPY Left  LAPAROSCOPIC CHOLECYSTECTOMY     MASS EXCISION  04/15/2012   Procedure: EXCISION MASS;  Surgeon: [name], MD;  Location: [place];  Service: ENT;  Laterality: Left;  Left tonsil biopsy   melanomia     several skin ca removed-nose, left ankle, parotid gland left, right nodes-being followed annually..    Current Medications: Current Meds  Medication Sig   ANUCORT-HC 25 MG suppository Place 25 mg rectally daily as needed.   Bromfenac Sodium (PROLENSA) 0.07 % SOLN    cholecalciferol (VITAMIN D) 1000 UNITS tablet Take 2,000 Units by mouth every morning.    citalopram (CELEXA) 40 MG tablet Take 40 mg by mouth every morning.   co-enzyme Q-10 50 MG  capsule Take 100 mg by mouth every morning.   Cranberry 500 MG CAPS Take 500 mg by mouth daily.   Difluprednate 0.05 % EMUL    fenofibrate micronized (LOFIBRA) 67 MG capsule TAKE 1 CAPSULE DAILY   FLAXSEED, LINSEED, PO Take 2 tablets by mouth 2 (two) times daily.   fluticasone (CUTIVATE) 0.05 % cream Apply topically 2 (two) times daily as needed.   folic acid (FOLVITE) 379 MCG tablet Take 400 mcg by mouth daily.   gabapentin (NEURONTIN) 300 MG capsule Take 1 capsule by mouth daily.   glyBURIDE (DIABETA) 5 MG tablet Take 5 mg by mouth every evening.   ketoconazole (NIZORAL) 2 % shampoo ketoconazole 2 % shampoo  WASH SCALP, CHEST, AND EARS 2 TO 3 TIMES A WEEK   metFORMIN (GLUCOPHAGE-XR) 500 MG 24 hr tablet Take 1,000 mg by mouth 2 (two) times daily.   metoprolol succinate (TOPROL-XL) 50 MG 24 hr tablet Take 1 tablet (50 mg total) by mouth daily. With or immediately following a meal. Please keep upcoming appt with Dr. Marlou Porch in September 2022 before anymore refills. Thank you   Multiple Vitamin (MULITIVITAMIN WITH MINERALS) TABS Take 1 tablet by mouth every evening.   nitroGLYCERIN (NITROSTAT) 0.4 MG SL tablet Place under the tongue.   omega-3 acid ethyl esters (LOVAZA) 1 g capsule TAKE 2 CAPSULES TWICE A DAY   pantoprazole (PROTONIX) 40 MG tablet Take 40 mg by mouth daily.   pioglitazone (ACTOS) 30 MG tablet Take 30 mg by mouth every morning.   potassium gluconate 595 MG TABS tablet Take 90 mg by mouth daily.    rosuvastatin (CRESTOR) 20 MG tablet Take 20 mg by mouth every evening.   testosterone cypionate (DEPOTESTOTERONE CYPIONATE) 100 MG/ML injection Inject 150 mg into the muscle every 14 (fourteen) days. For IM use only   triamcinolone lotion (KENALOG) 0.1 % triamcinolone acetonide 0.1 % lotion  APPLY TO AFFECTED AREA TWICE A DAY AS NEEDED FOR FLARE/ITCH   valsartan (DIOVAN) 160 MG tablet Take 80 mg by mouth every morning.   vitamin C (ASCORBIC ACID) 500 MG tablet Take 1,000 mg by mouth  every morning.   warfarin (COUMADIN) 5 MG tablet Take 5 mg by mouth every evening. As directed--1/2 tab every Monday, Wednesday, and Friday; 1 tab all other days     Allergies:   Doxycycline and Penicillins   Social History   Socioeconomic History   Marital status: Married    Spouse name: Not on file   Number of children: Not on file   Years of education: Not on file   Highest education level: Not on file  Occupational History   Not on file  Tobacco Use   Smoking status: Never   Smokeless tobacco: Never  Substance and Sexual Activity  Alcohol use: No   Drug use: No   Sexual activity: Not on file  Other Topics Concern   Not on file  Social History Narrative   Not on file   Social Determinants of Health   Financial Resource Strain: Not on file  Food Insecurity: Not on file  Transportation Needs: Not on file  Physical Activity: Not on file  Stress: Not on file  Social Connections: Not on file      ROS:   Please see the history of present illness.    All other systems reviewed and are negative.  EKGs/Labs/Other Studies Reviewed:    The following studies were reviewed today:  Echo 05/13/2021: 1. Left ventricular ejection fraction, by estimation, is 55 to 60%. The  left ventricle has normal function. The left ventricle has no regional  wall motion abnormalities. The left ventricular internal cavity size was  mildly dilated. There is mild left  ventricular hypertrophy. Left ventricular diastolic parameters are  indeterminate.   2. Right ventricular systolic function is normal. The right ventricular  size is normal. There is normal pulmonary artery systolic pressure.   3. Left atrial size was mild to moderately dilated.   4. The mitral valve is normal in structure. Trivial mitral valve  regurgitation.   5. The aortic valve is tricuspid. Aortic valve regurgitation is not  visualized. Mild aortic valve sclerosis is present, with no evidence of  aortic valve  stenosis.   6. Aortic dilatation noted. There is mild dilatation of the aortic root,  measuring 40 mm. There is mild dilatation of the ascending aorta,  measuring 43 mm.   7. The inferior vena cava is normal in size with greater than 50%  respiratory variability, suggesting right atrial pressure of 3 mmHg.   Comparison(s): The left ventricular function is unchanged.  Carotid Duplex 05/17/2018: Final Interpretation:  Right Carotid: Velocities in the right ICA are consistent with a 1-39%  stenosis. Non-hemodynamically significant plaque <50% noted in the  CCA.   Left Carotid: Velocities in the left ICA are consistent with a 40-59%  stenosis. Non-hemodynamically significant plaque noted in the CCA.   Vertebrals:  Bilateral vertebral arteries demonstrate antegrade flow.  Subclavians: Normal flow hemodynamics were seen in bilateral subclavian               arteries.   ECHO 02/27/15 - Left ventricle: The cavity size was mildly dilated. Wall   thickness was normal. Systolic function was normal. The estimated   ejection fraction was in the range of 55% to 60%. Wall motion was   normal; there were no regional wall motion abnormalities.    EKG:   EKG is personally reviewed and interpreted. 08/09/2021 atrial flutter 66 typical 06/28/2021: Atrial flutter. Rate 68 bpm. 03/05/2020: sinus bradycardia 52 no other abnormalities  Recent Labs: 06/07/2021: BUN 41; Creatinine, Ser 2.10; Potassium 5.2; Sodium 137   Recent Lipid Panel No results found for: CHOL, TRIG, HDL, CHOLHDL, VLDL, LDLCALC, LDLDIRECT     Physical Exam:    VS:  BP 100/60 (BP Location: Left Arm, Patient Position: Sitting, Cuff Size: Normal)   Pulse 66   Ht 5\' 10"  (1.778 m)   Wt 227 lb (103 kg)   SpO2 94%   BMI 32.57 kg/m     Wt Readings from Last 3 Encounters:  08/09/21 227 lb (103 kg)  08/08/21 220 lb (99.8 kg)  06/28/21 222 lb (100.7 kg)    GEN: Well nourished, well developed, in no acute distress HEENT:  normal Neck: no JVD, carotid bruits, or masses Cardiac: RRR; no murmurs, rubs, or gallops,no edema  Respiratory:  clear to auscultation bilaterally, normal work of breathing GI: soft, nontender, nondistended, + BS MS: no deformity or atrophy Skin: warm and dry, no rash Neuro:  Alert and Oriented x 3, Strength and sensation are intact Psych: euthymic mood, full affect     ASSESSMENT:    1. Typical atrial flutter ([place])   2. Coronary artery disease involving native coronary artery of native heart without angina pectoris      PLAN:    In order of problems listed above: Typical atrial flutter [facility]) Now has cardioversion set up.  He has had 3 successive INRs that are in range.  I took a picture of this and put it into my note.  Appreciate Dr. Geraldo Docker office helping [place] with this.  He is on warfarin.  Okay to proceed with cardioversion.  If atrial flutter were to return, we will send to EP for flutter ablation.  It is typical.  Coronary artery disease Prior CABG, patent LIMA to LAD occluded diagonal and RCA vein graft.  Normal EF.  No anginal symptoms.  At 1 point he was taking Ranexa but he is now off.  Continue with aggressive secondary risk factor prevention.  Previously amlodipine was discontinued because of low pressure surrounding his prostate surgery.   Follow-up: 12 months.  Medication Adjustments/Labs and Tests Ordered: Current medicines are reviewed at length with the patient today.  Concerns regarding medicines are outlined above.   Orders Placed This Encounter  Procedures   EKG 12-Lead    No orders of the defined types were placed in this encounter.  Patient Instructions  Medication Instructions:  The current medical regimen is effective;  continue present plan and medications.  *If you need a refill on your cardiac medications before your next appointment, please call your pharmacy*  Lab Work: None today. If you have labs (blood work) drawn today and your tests  are completely normal, you will receive your results only by: [place] (if you have MyChart) OR A paper copy in the mail If you have any lab test that is abnormal or we need to change your treatment, we will call you to review the results.  Testing/Procedures: Your physician has requested that you have a Cardioversion. Electrical Cardioversion uses a jolt of electricity to your heart either through paddles or wired patches attached to your chest. This is a controlled, usually prescheduled, procedure. This procedure is done at the hospital and you are not awake during the procedure. You usually go home the day of the procedure. Please see the instruction sheet given to you today for more information.  You are scheduled for a Cardioversion on Monday August 12, 2021 with Dr. Harrington Challenger.  Please arrive at the [facility] (Main Entrance A) at [facility]: [address] [place], [place] [address] at [address] (1 hour prior to procedure unless lab work is needed; if lab work is needed arrive 1.5 hours ahead)  DIET: Nothing to eat or drink after midnight except a sip of water with medications (see medication instructions below)  FYI: For your safety, and to allow [place] to monitor your vital signs accurately during the surgery/procedure we request that   if you have artificial nails, gel coating, SNS etc. Please have those removed prior to your surgery/procedure. Not having the nail coverings /polish removed may result in cancellation or delay of your surgery/procedure.  Medication Instructions:  Hold any medication for DM this AM (Metformin, Actos, Glyburide) Continue your anticoagulant: Warfarin You will need to continue your anticoagulant after your procedure until you  are told by your  Provider that it is safe to stop  You must have a responsible person to drive you home and stay in the waiting area during your procedure. Failure to do so could result in cancellation.  Bring your insurance  cards.  *Special Note: Every effort is made to have your procedure done on time. Occasionally there are emergencies that occur at the hospital that may cause delays. Please be patient if a delay does occur.   Follow-Up: At [facility], you and your health needs are our priority.  As part of our continuing mission to provide you with exceptional heart care, we have created designated Provider Care Teams.  These Care Teams include your primary Cardiologist (physician) and Advanced Practice Providers (APPs -  Physician Assistants and Nurse Practitioners) who all work together to provide you with the care you need, when you need it.  We recommend signing up for the patient portal called "MyChart".  Sign up information is provided on this After Visit Summary.  MyChart is used to connect with patients for Virtual Visits (Telemedicine).  Patients are able to view lab/test results, encounter notes, upcoming appointments, etc.  Non-urgent messages can be sent to your provider as well.   To learn more about what you can do with MyChart, go to [email or web address].    Your next appointment:   1 month(s)  The format for your next appointment:   In Person  Provider:   You will follow up in the [facility] located at [facility]. Your provider will be: Roderic Palau, NP or Clint R. Fenton, PA-C{   Thank you for choosing [facility]!!     I,Mathew Stumpf,acting as a scribe for [employer], MD.,have documented all relevant documentation on the behalf of Candee Furbish, MD,as directed by  Candee Furbish, MD while in the presence of Candee Furbish, MD.  I, Candee Furbish, MD, have reviewed all documentation for this visit. The documentation on 08/09/21 for the exam, diagnosis, procedures, and orders are all accurate and complete.   Signed, Candee Furbish, MD  08/09/2021 1:08 PM     [facility]

## 2021-08-09 NOTE — Assessment & Plan Note (Signed)
Now has cardioversion set up.  He has had 3 successive INRs that are in range.  I took a picture of this and put it into my note.  Appreciate Dr. Geraldo Docker office helping [place] with this.  He is on warfarin.  Okay to proceed with cardioversion.  If atrial flutter were to return, we will send to EP for flutter ablation.  It is typical.

## 2021-08-10 ENCOUNTER — Other Ambulatory Visit: Payer: Self-pay | Admitting: Cardiology

## 2021-08-10 DIAGNOSIS — I483 Typical atrial flutter: Secondary | ICD-10-CM

## 2021-08-12 ENCOUNTER — Encounter (HOSPITAL_COMMUNITY)
Admission: RE | Disposition: A | Discharge status: Home / Self Care | Payer: Self-pay | Source: Home / Self Care | Attending: Internal Medicine

## 2021-08-12 ENCOUNTER — Ambulatory Visit (HOSPITAL_COMMUNITY)
Admission: RE | Admit: 2021-08-12 | Discharge: 2021-08-12 | Disposition: A | Discharge status: Home / Self Care | Payer: 59 | Attending: Internal Medicine | Admitting: Internal Medicine

## 2021-08-12 ENCOUNTER — Ambulatory Visit (HOSPITAL_COMMUNITY): Payer: 59 | Admitting: Anesthesiology

## 2021-08-12 ENCOUNTER — Encounter (HOSPITAL_COMMUNITY): Payer: Self-pay | Admitting: Internal Medicine

## 2021-08-12 ENCOUNTER — Other Ambulatory Visit: Payer: Self-pay

## 2021-08-12 DIAGNOSIS — E7800 Pure hypercholesterolemia, unspecified: Secondary | ICD-10-CM | POA: Diagnosis not present

## 2021-08-12 DIAGNOSIS — Z951 Presence of aortocoronary bypass graft: Secondary | ICD-10-CM | POA: Insufficient documentation

## 2021-08-12 DIAGNOSIS — I483 Typical atrial flutter: Secondary | ICD-10-CM | POA: Insufficient documentation

## 2021-08-12 DIAGNOSIS — I4891 Unspecified atrial fibrillation: Secondary | ICD-10-CM

## 2021-08-12 DIAGNOSIS — Z7901 Long term (current) use of anticoagulants: Secondary | ICD-10-CM | POA: Insufficient documentation

## 2021-08-12 DIAGNOSIS — F418 Other specified anxiety disorders: Secondary | ICD-10-CM | POA: Diagnosis not present

## 2021-08-12 DIAGNOSIS — I2510 Atherosclerotic heart disease of native coronary artery without angina pectoris: Secondary | ICD-10-CM | POA: Insufficient documentation

## 2021-08-12 DIAGNOSIS — I4892 Unspecified atrial flutter: Secondary | ICD-10-CM | POA: Diagnosis not present

## 2021-08-12 DIAGNOSIS — I2699 Other pulmonary embolism without acute cor pulmonale: Secondary | ICD-10-CM | POA: Diagnosis not present

## 2021-08-12 HISTORY — PX: CARDIOVERSION: SHX1299

## 2021-08-12 LAB — POCT I-STAT, CHEM 8
BUN: 23 mg/dL (ref 8–23)
Calcium, Ion: 1.16 mmol/L (ref 1.15–1.40)
Chloride: 105 mmol/L (ref 98–111)
Creatinine, Ser: 1.7 mg/dL — ABNORMAL HIGH (ref 0.61–1.24)
Glucose, Bld: 98 mg/dL (ref 70–99)
HCT: 47 % (ref 39.0–52.0)
Hemoglobin: 16 g/dL (ref 13.0–17.0)
Potassium: 4.4 mmol/L (ref 3.5–5.1)
Sodium: 139 mmol/L (ref 135–145)
TCO2: 24 mmol/L (ref 22–32)

## 2021-08-12 LAB — PROTIME-INR
INR: 3.7 — ABNORMAL HIGH (ref 0.8–1.2)
Prothrombin Time: 37 seconds — ABNORMAL HIGH (ref 11.4–15.2)

## 2021-08-12 SURGERY — CARDIOVERSION
Anesthesia: General

## 2021-08-12 MED ORDER — PROPOFOL 10 MG/ML IV BOLUS
INTRAVENOUS | Status: DC | PRN
  Administered 2021-08-12: 50 mg via INTRAVENOUS

## 2021-08-12 MED ORDER — SODIUM CHLORIDE 0.9 % IV SOLN: INTRAVENOUS | Status: DC

## 2021-08-12 MED ORDER — LIDOCAINE HCL (CARDIAC) PF 100 MG/5ML IV SOSY
PREFILLED_SYRINGE | INTRAVENOUS | Status: DC | PRN
  Administered 2021-08-12: 60 mg via INTRAVENOUS

## 2021-08-12 NOTE — CV Procedure (Signed)
CARDIOVERSION  Indication:  Atrial fibrillation/flutter  Patient sedated by anesthesia with lidocaine and propofol intravenously  With pads in AP position patient cardioverted to SR with 200 J synchronized biphasic energy  Procedure was without complication  12 lead EKG done   Dorris Carnes MD   INR was 3.7 today (took coumadin this am)  Will forward to coumadin clinic    Given cardioversion, may opt to keep the same.  Dorris Carnes MD

## 2021-08-12 NOTE — Transfer of Care (Signed)
Immediate Anesthesia Transfer of Care Note  Patient: Evan [name]  Procedure(s) Performed: CARDIOVERSION  Patient Location: Endoscopy Unit  Anesthesia Type:General  Level of Consciousness: awake, oriented and patient cooperative  Airway & Oxygen Therapy: Patient Spontanous Breathing and Patient connected to nasal cannula oxygen  Post-op Assessment: Report given to RN and Post -op Vital signs reviewed and stable  Post vital signs: Reviewed  Last Vitals:  Vitals Value Taken Time  BP 106/70 08/12/21 1408  Temp 36.2 C 08/12/21 1408  Pulse 76 08/12/21 1412  Resp 23 08/12/21 1412  SpO2 97 % 08/12/21 1412  Vitals shown include unvalidated device data.  Last Pain:  Vitals:   08/12/21 1408  TempSrc: Temporal  PainSc: 0-No pain         Complications: No notable events documented.

## 2021-08-12 NOTE — Interval H&P Note (Signed)
History and Physical Interval Note:  08/12/2021 12:27 PM  Evan [name]  has presented today for surgery, with the diagnosis of aflutter.  The various methods of treatment have been discussed with the patient and family. After consideration of risks, benefits and other options for treatment, the patient has consented to  Procedure(s): CARDIOVERSION (N/A) as a surgical intervention.  The patient's history has been reviewed, patient examined, no change in status, stable for surgery.  I have reviewed the patient's chart and labs.  Questions were answered to the patient's satisfaction.     Dorris Carnes

## 2021-08-12 NOTE — Anesthesia Postprocedure Evaluation (Signed)
Anesthesia Post Note  Patient: Evan [name]  Procedure(s) Performed: CARDIOVERSION     Patient location during evaluation: PACU Anesthesia Type: General Level of consciousness: awake and alert, oriented and patient cooperative Pain management: pain level controlled Vital Signs Assessment: post-procedure vital signs reviewed and stable Respiratory status: spontaneous breathing, nonlabored ventilation and respiratory function stable Cardiovascular status: blood pressure returned to baseline and stable Postop Assessment: no apparent nausea or vomiting Anesthetic complications: no   No notable events documented.  Last Vitals:  Vitals:   08/12/21 1408 08/12/21 1418  BP: 106/70 130/75  Pulse: 76 77  Resp: (!) 23 (!) 21  Temp: (!) 36.2 C   SpO2: 99% 96%    Last Pain:  Vitals:   08/12/21 1418  TempSrc:   PainSc: 0-No pain                 Pervis Hocking

## 2021-08-12 NOTE — Anesthesia Procedure Notes (Signed)
Procedure Name: General with mask airway Date/Time: 08/12/2021 1:50 PM Performed by: Jenne Campus, CRNA Pre-anesthesia Checklist: Patient identified, Emergency Drugs available, Suction available and Patient being monitored Patient Re-evaluated:Patient Re-evaluated prior to induction Oxygen Delivery Method: Ambu bag

## 2021-08-12 NOTE — Anesthesia Preprocedure Evaluation (Addendum)
Anesthesia Evaluation  Patient identified by MRN, date of birth, ID band Patient awake    Reviewed: Allergy & Precautions, H&P , NPO status , Patient's Chart, lab work & pertinent test results  Airway Mallampati: II  TM Distance: >3 FB Neck ROM: Limited    Dental  (+) Teeth Intact, Dental Advisory Given   Pulmonary sleep apnea ,    breath sounds clear to auscultation       Cardiovascular hypertension, + CAD, + Past MI and + CABG  + dysrhythmias Atrial Fibrillation  Rhythm:irregular Rate:Normal     Neuro/Psych PSYCHIATRIC DISORDERS Anxiety Depression    GI/Hepatic GERD  ,  Endo/Other  diabetes, Type 2  Renal/GU stones     Musculoskeletal   Abdominal   Peds  Hematology   Anesthesia Other Findings   Reproductive/Obstetrics                            Anesthesia Physical Anesthesia Plan  ASA: 3  Anesthesia Plan: General   Post-op Pain Management:    Induction: Intravenous  PONV Risk Score and Plan: 2 and Propofol infusion and Treatment may vary due to age or medical condition  Airway Management Planned: Nasal Cannula  Additional Equipment:   Intra-op Plan:   Post-operative Plan:   Informed Consent: I have reviewed the patients History and Physical, chart, labs and discussed the procedure including the risks, benefits and alternatives for the proposed anesthesia with the patient or authorized representative who has indicated his/her understanding and acceptance.     Dental advisory given  Plan Discussed with: CRNA, Anesthesiologist and Surgeon  Anesthesia Plan Comments:         Anesthesia Quick Evaluation

## 2021-08-12 NOTE — Discharge Instructions (Signed)

## 2021-08-13 ENCOUNTER — Encounter (HOSPITAL_COMMUNITY): Payer: Self-pay | Admitting: Internal Medicine

## 2021-08-26 ENCOUNTER — Other Ambulatory Visit: Payer: Self-pay

## 2021-08-26 ENCOUNTER — Ambulatory Visit (HOSPITAL_BASED_OUTPATIENT_CLINIC_OR_DEPARTMENT_OTHER): Payer: 59 | Attending: Internal Medicine | Admitting: Internal Medicine

## 2021-08-26 VITALS — Ht 70.0 in | Wt 220.0 lb

## 2021-08-26 DIAGNOSIS — G4733 Obstructive sleep apnea (adult) (pediatric): Secondary | ICD-10-CM | POA: Insufficient documentation

## 2021-08-26 DIAGNOSIS — G4710 Hypersomnia, unspecified: Secondary | ICD-10-CM | POA: Insufficient documentation

## 2021-08-26 DIAGNOSIS — R0683 Snoring: Secondary | ICD-10-CM | POA: Insufficient documentation

## 2021-08-26 DIAGNOSIS — R0681 Apnea, not elsewhere classified: Secondary | ICD-10-CM

## 2021-08-27 DIAGNOSIS — G4733 Obstructive sleep apnea (adult) (pediatric): Secondary | ICD-10-CM | POA: Diagnosis not present

## 2021-08-31 NOTE — Procedures (Signed)
   NAME: Evan [name] DATE OF BIRTH:  1946/03/14 MEDICAL RECORD NUMBER [id number]  LOCATION: [facility]  PHYSICIAN: Marius Ditch  DATE OF STUDY: 08/26/2021  SLEEP STUDY TYPE: Positive Airway Pressure Titration               REFERRING PHYSICIAN: Marius Ditch, MD  EPWORTH SLEEPINESS SCORE:  NA HEIGHT: 5\' 10"  (177.8 cm)  WEIGHT: 220 lb (99.8 kg)    Body mass index is 31.57 kg/m.  NECK SIZE: 16.5 in.  CLINICAL INFORMATION The patient was referred to the sleep center for PAP titration. He has severe OSA and has never been very successful with CPAP, especially recently. He had a CPAP titration in 2011 that indicated he needed a CPAP  of 11. Most recent downloads show a 95%ile pressure of 10.4.  MEDICATIONS Patient self administered medications include none and no sleep medicine was administered.[name]  SLEEP STUDY TECHNIQUE The patient underwent an attended overnight polysomnography titration to assess the effects of cpap therapy. The following variables were monitored: EEG (C4-A1, C3-A2, O1-A2, O2-A1, F3-M2, F4-M1), EOG, submental and leg EMG, ECG, oxyhemoglobin saturation by pulse oximetry, thoracic and abdominal respiratory effort belts, nasal/oral airflow by pressure sensor, body position sensor and snoring sensor. CPAP pressure was titrated to eliminate apneas, hypopneas and oxygen desaturation.  TECHNICAL COMMENTS Comments added by Technician: Patient was ordered as a cpap titration. Comments added by Scorer: N/A  SLEEP ARCHITECTURE The study was initiated at 10:28:30 PM and terminated at 5:00:59 AM. Total recorded time was 392.5 minutes. EEG confirmed total sleep time was 304 minutes yielding a sleep efficiency of 77.5%. Sleep onset after lights out was 11.2 minutes with a REM latency of 317.5 minutes. The patient spent 13.7% of the night in stage N1 sleep, 82.2% in stage N2 sleep, 0.0% in stage N3 and 4.1% in REM. The Arousal Index was  19.3/hour.  RESPIRATORY PARAMETERS The overall AHI was 6.5 per hour, and the RDI was 16.0 events/hour with a central apnea index of 0 per hour. The highest of BiPAP used was IPAP/EPAP 24/20 cm H2O. However, CPAP of 5 resulted in AHI of around 5/hour  LEG MOVEMENT DATA The total leg movements were 356 with a resulting leg movement index of 70.3. Associated arousal with leg movement index was 1.4.  CARDIAC DATA The underlying cardiac rhythm was most consistent with sinus rhythm. Mean heart rate during sleep was 56.6 bpm. Additional rhythm abnormalities include None.  IMPRESSIONS - Severe Obstructive Sleep Apnea (OSA). Optimal pressure attained, but lower pressures may work adequately. - Satisfactory mask seemed to have been found.   DIAGNOSIS - Obstructive Sleep Apnea (G47.33)  RECOMMENDATIONS - Change his current machine settings to APAP 10-20 with ramp from 5 to 10 over 10 minutes. Use of Medium size Resmed Full Face Mask Mirage Quattro mask and heated humidification.  Marius Ditch Sleep specialist, [place] Board of Internal Medicine  ELECTRONICALLY SIGNED ON:  08/31/2021, 9:25 AM [place] PH: [phone]   FX: [phone] [place]

## 2021-09-02 DIAGNOSIS — E291 Testicular hypofunction: Secondary | ICD-10-CM | POA: Diagnosis not present

## 2021-09-02 DIAGNOSIS — Z7901 Long term (current) use of anticoagulants: Secondary | ICD-10-CM | POA: Diagnosis not present

## 2021-09-04 ENCOUNTER — Other Ambulatory Visit: Payer: Self-pay | Admitting: Cardiology

## 2021-09-10 ENCOUNTER — Encounter (HOSPITAL_COMMUNITY): Payer: Self-pay | Admitting: Nurse Practitioner

## 2021-09-10 ENCOUNTER — Ambulatory Visit (HOSPITAL_COMMUNITY)
Admission: RE | Admit: 2021-09-10 | Discharge: 2021-09-10 | Disposition: A | Discharge status: Home / Self Care | Payer: 59 | Source: Ambulatory Visit | Attending: Nurse Practitioner | Admitting: Nurse Practitioner

## 2021-09-10 ENCOUNTER — Other Ambulatory Visit: Payer: Self-pay

## 2021-09-10 VITALS — BP 158/76 | HR 65 | Ht 70.0 in | Wt 224.8 lb

## 2021-09-10 DIAGNOSIS — I483 Typical atrial flutter: Secondary | ICD-10-CM

## 2021-09-10 DIAGNOSIS — I2510 Atherosclerotic heart disease of native coronary artery without angina pectoris: Secondary | ICD-10-CM | POA: Diagnosis not present

## 2021-09-10 DIAGNOSIS — G4733 Obstructive sleep apnea (adult) (pediatric): Secondary | ICD-10-CM | POA: Diagnosis not present

## 2021-09-10 DIAGNOSIS — D6869 Other thrombophilia: Secondary | ICD-10-CM

## 2021-09-10 DIAGNOSIS — Z86711 Personal history of pulmonary embolism: Secondary | ICD-10-CM | POA: Insufficient documentation

## 2021-09-10 DIAGNOSIS — Z7901 Long term (current) use of anticoagulants: Secondary | ICD-10-CM | POA: Insufficient documentation

## 2021-09-10 DIAGNOSIS — I4892 Unspecified atrial flutter: Secondary | ICD-10-CM | POA: Diagnosis present

## 2021-09-10 NOTE — Progress Notes (Signed)
Primary Care Physician: Josetta Huddle, MD Referring Physician: Dr. Aviva Kluver is a 75 y.o. male with a h/o new onset typical atrial flutter, on warfarin for remote PE's,  that is here for f/u of successful cardioversion. He remains in SR today. He reports that this was picked up at time of f/u with Dr. Marlou Porch, he was asymptomatic. He does not drink alcohol, no caffeine or tobacco use. He is pending the face mask for a recent sleep study for OSA,  followed by Dr. Maxwell Caul.   Today, he denies symptoms of palpitations, chest pain, shortness of breath, orthopnea, PND, lower extremity edema, dizziness, presyncope, syncope, or neurologic sequela. The patient is tolerating medications without difficulties and is otherwise without complaint today.   Past Medical History:  Diagnosis Date   Acquired dilation of ascending aorta and aortic root ([place])    a.) measurements by TTE in 04/2021 --> root 40 mm and ascending aorta 43 mm   Anginal pain (HCC)    Anxiety    Atrial flutter ([place]) 04/22/2021   a.) CHADS2VASc = 5 (age x 2, HTN, T2DM, CAD); b.) daily warfarin   Carotid disease, bilateral ([place])    a.) Doppler in 2019 --> 1-39% stenosis on RIGHT and 40-58% on LEFT   Cataracts, bilateral    Chronic anticoagulation    Warfarin   Chronic renal insufficiency    Coronary artery disease    a.) s/p 3v CABG in 1999   Depression    GERD (gastroesophageal reflux disease)    History of kidney stones    Hypercholesteremia    Hypertension    Kidney stone    Low testosterone    on TRT injections   Malignant melanoma ([place])    a.) face, nose, left leg; b.) Tx'd with chemotherapy + [facility] at [facility]   Myocardial infarction [facility])    Pulmonary emboli ([place])    S/P CABG x 3 1999   a.) LIMA-LAD, SVG-RCA, SVG-diagnonal   Sleep apnea    not using nocturnal PAP therapy   T2DM (type 2 diabetes mellitus) ([place])    Past Surgical History:  Procedure Laterality Date   CARDIAC CATHETERIZATION     x3      last 1999   CARDIOVERSION N/A 08/12/2021   Procedure: CARDIOVERSION;  Surgeon: Fay Records, MD;  Location: [place];  Service: Cardiovascular;  Laterality: N/A;   CATARACT EXTRACTION Bilateral    COLONOSCOPY WITH PROPOFOL N/A 09/26/2014   Procedure: COLONOSCOPY WITH PROPOFOL;  Surgeon: [name], MD;  [facility];  Service: Endoscopy;  Laterality: N/A;   CORONARY ARTERY BYPASS GRAFT N/A 1999   3v; LIMA-LAD, SVG-RCA, SVG-diagonal   HOLEP-LASER ENUCLEATION OF THE PROSTATE WITH MORCELLATION N/A 06/07/2021   Procedure: HOLEP-LASER ENUCLEATION OF THE PROSTATE WITH MORCELLATION;  Surgeon: Billey Co, MD;  Location: [facility];  Service: Urology;  Laterality: N/A;   KNEE ARTHROSCOPY Left    LAPAROSCOPIC CHOLECYSTECTOMY     MASS EXCISION  04/15/2012   Procedure: EXCISION MASS;  Surgeon: [name], MD;  Location: [place];  Service: ENT;  Laterality: Left;  Left tonsil biopsy   melanomia     several skin ca removed-nose, left ankle, parotid gland left, right nodes-being followed annually..    Current Outpatient Medications  Medication Sig Dispense Refill   ANUCORT-HC 25 MG suppository Place 25 mg rectally daily as needed.     cholecalciferol (VITAMIN D) 1000 UNITS tablet Take 2,000 Units by mouth every  morning.      citalopram (CELEXA) 40 MG tablet Take 40 mg by mouth every morning.     co-enzyme Q-10 50 MG capsule Take 100 mg by mouth every morning.     Cranberry 500 MG CAPS Take 500 mg by mouth daily.     fenofibrate micronized (LOFIBRA) 67 MG capsule TAKE 1 CAPSULE DAILY 90 capsule 3   FLAXSEED, LINSEED, PO Take 2 tablets by mouth 2 (two) times daily.     fluticasone (CUTIVATE) 0.05 % cream Apply topically 2 (two) times daily as needed.     folic acid (FOLVITE) 258 MCG tablet Take 400 mcg by mouth daily.     gabapentin (NEURONTIN) 300 MG capsule Take 1 capsule by mouth daily.     glyBURIDE (DIABETA) 5 MG tablet Take 5 mg by mouth every evening.     ketoconazole  (NIZORAL) 2 % shampoo ketoconazole 2 % shampoo  WASH SCALP, CHEST, AND EARS 2 TO 3 TIMES A WEEK     metFORMIN (GLUCOPHAGE-XR) 500 MG 24 hr tablet Take 1,000 mg by mouth 2 (two) times daily.     metoprolol succinate (TOPROL-XL) 50 MG 24 hr tablet Take 1 tablet (50 mg total) by mouth daily. With or immediately following a meal 90 tablet 3   Multiple Vitamin (MULITIVITAMIN WITH MINERALS) TABS Take 1 tablet by mouth every evening.     nitroGLYCERIN (NITROSTAT) 0.4 MG SL tablet Place 0.4 mg under the tongue every 5 (five) minutes as needed for chest pain.     omega-3 acid ethyl esters (LOVAZA) 1 g capsule TAKE 2 CAPSULES TWICE A DAY 360 capsule 3   pantoprazole (PROTONIX) 40 MG tablet Take 40 mg by mouth daily.     pioglitazone (ACTOS) 30 MG tablet Take 30 mg by mouth every morning.     potassium gluconate 595 MG TABS tablet Take 90 mg by mouth daily.      rosuvastatin (CRESTOR) 20 MG tablet Take 20 mg by mouth every evening.     testosterone cypionate (DEPOTESTOTERONE CYPIONATE) 100 MG/ML injection Inject 150 mg into the muscle every 14 (fourteen) days. For IM use only     valsartan (DIOVAN) 160 MG tablet Take 80 mg by mouth every morning.     vitamin C (ASCORBIC ACID) 500 MG tablet Take 1,000 mg by mouth every morning.     warfarin (COUMADIN) 5 MG tablet Take 5 mg by mouth every evening. As directed--1/2 tab every Monday, Wednesday, and Friday; 1 tab all other days     No current facility-administered medications for this encounter.    Allergies  Allergen Reactions   Doxycycline Other (See Comments)    BPH can't void when taking    Penicillins Rash and Itching    Social History   Socioeconomic History   Marital status: Married    Spouse name: Not on file   Number of children: Not on file   Years of education: Not on file   Highest education level: Not on file  Occupational History   Not on file  Tobacco Use   Smoking status: Never   Smokeless tobacco: Never  Substance and Sexual  Activity   Alcohol use: No   Drug use: No   Sexual activity: Not on file  Other Topics Concern   Not on file  Social History Narrative   Not on file   Social Determinants of Health   Financial Resource Strain: Not on file  Food Insecurity: Not on file  Transportation Needs:  Not on file  Physical Activity: Not on file  Stress: Not on file  Social Connections: Not on file  Intimate Partner Violence: Not on file    No family history on file.  ROS- All systems are reviewed and negative except as per the HPI above  Physical Exam: Vitals:   09/10/21 0855  BP: (!) 158/76  Pulse: 65  Weight: 102 kg  Height: 5\' 10"  (1.778 m)   Wt Readings from Last 3 Encounters:  09/10/21 102 kg  08/26/21 99.8 kg  08/09/21 103 kg    Labs: Lab Results  Component Value Date   NA 139 08/12/2021   K 4.4 08/12/2021   CL 105 08/12/2021   CO2 27 06/07/2021   GLUCOSE 98 08/12/2021   BUN 23 08/12/2021   CREATININE 1.70 (H) 08/12/2021   CALCIUM 9.7 06/07/2021   Lab Results  Component Value Date   INR 3.7 (H) 08/12/2021   No results found for: CHOL, HDL, LDLCALC, TRIG   GEN- The patient is well appearing, alert and oriented x 3 today.   Head- normocephalic, atraumatic Eyes-  Sclera clear, conjunctiva pink Ears- hearing intact Oropharynx- clear Neck- supple, no JVP Lymph- no cervical lymphadenopathy Lungs- Clear to ausculation bilaterally, normal work of breathing Heart- Regular rate and rhythm, no murmurs, rubs or gallops, PMI not laterally displaced GI- soft, NT, ND, + BS Extremities- no clubbing, cyanosis, or edema MS- no significant deformity or atrophy Skin- no rash or lesion Psych- euthymic mood, full affect Neuro- strength and sensation are intact  EKG-NSR at 65 bpm, PR int 206 ms,s qrs int 122 ms, qtc 436 ms     Assessment and Plan:  1. New onset typical atrial flutter Successful cardioversion and is in SR today  General education re arrhythmia and triggers discussed   He was asymptomatic with flutter   2. CHA2DS2VASc  score of at least 6 He will continue on warfarin  Started yrs ago after PE  3. CAD No anginal symptoms   4. OSA Working with Dr. Maxwell Caul to get face mask   F/u  with Dr. Marlou Porch as scheduled, afib clinic as needed   Geroge Baseman. Hakeen Shipes, [facility] [address] [place], [place] [address] [phone]

## 2021-09-16 DIAGNOSIS — E291 Testicular hypofunction: Secondary | ICD-10-CM | POA: Diagnosis not present

## 2021-10-07 DIAGNOSIS — E291 Testicular hypofunction: Secondary | ICD-10-CM | POA: Diagnosis not present

## 2021-10-07 DIAGNOSIS — Z7901 Long term (current) use of anticoagulants: Secondary | ICD-10-CM | POA: Diagnosis not present

## 2021-10-21 DIAGNOSIS — Z7901 Long term (current) use of anticoagulants: Secondary | ICD-10-CM | POA: Diagnosis not present

## 2021-10-28 DIAGNOSIS — E291 Testicular hypofunction: Secondary | ICD-10-CM | POA: Diagnosis not present

## 2021-10-28 DIAGNOSIS — Z7901 Long term (current) use of anticoagulants: Secondary | ICD-10-CM | POA: Diagnosis not present

## 2021-12-02 DIAGNOSIS — C44622 Squamous cell carcinoma of skin of right upper limb, including shoulder: Secondary | ICD-10-CM | POA: Diagnosis not present

## 2021-12-24 DIAGNOSIS — E1121 Type 2 diabetes mellitus with diabetic nephropathy: Secondary | ICD-10-CM | POA: Diagnosis not present

## 2021-12-24 DIAGNOSIS — E1165 Type 2 diabetes mellitus with hyperglycemia: Secondary | ICD-10-CM | POA: Diagnosis not present

## 2021-12-24 DIAGNOSIS — I25118 Atherosclerotic heart disease of native coronary artery with other forms of angina pectoris: Secondary | ICD-10-CM | POA: Diagnosis not present

## 2021-12-24 DIAGNOSIS — D6859 Other primary thrombophilia: Secondary | ICD-10-CM | POA: Diagnosis not present

## 2021-12-24 DIAGNOSIS — F325 Major depressive disorder, single episode, in full remission: Secondary | ICD-10-CM | POA: Diagnosis not present

## 2021-12-24 DIAGNOSIS — E559 Vitamin D deficiency, unspecified: Secondary | ICD-10-CM | POA: Diagnosis not present

## 2021-12-24 DIAGNOSIS — G4733 Obstructive sleep apnea (adult) (pediatric): Secondary | ICD-10-CM | POA: Diagnosis not present

## 2021-12-24 DIAGNOSIS — Z7901 Long term (current) use of anticoagulants: Secondary | ICD-10-CM | POA: Diagnosis not present

## 2021-12-24 DIAGNOSIS — Z79899 Other long term (current) drug therapy: Secondary | ICD-10-CM | POA: Diagnosis not present

## 2021-12-24 DIAGNOSIS — E291 Testicular hypofunction: Secondary | ICD-10-CM | POA: Diagnosis not present

## 2021-12-24 DIAGNOSIS — E1142 Type 2 diabetes mellitus with diabetic polyneuropathy: Secondary | ICD-10-CM | POA: Diagnosis not present

## 2021-12-24 DIAGNOSIS — I10 Essential (primary) hypertension: Secondary | ICD-10-CM | POA: Diagnosis not present

## 2021-12-24 DIAGNOSIS — F322 Major depressive disorder, single episode, severe without psychotic features: Secondary | ICD-10-CM | POA: Diagnosis not present

## 2021-12-30 ENCOUNTER — Other Ambulatory Visit: Payer: Self-pay | Admitting: *Deleted

## 2021-12-30 MED ORDER — METOPROLOL SUCCINATE ER 50 MG PO TB24: 50.0000 mg | ORAL_TABLET | Freq: Every day | ORAL | 3 refills | Status: DC

## 2021-12-30 MED ORDER — FENOFIBRATE MICRONIZED 67 MG PO CAPS: 67.0000 mg | ORAL_CAPSULE | Freq: Every day | ORAL | 3 refills | Status: DC

## 2021-12-31 ENCOUNTER — Other Ambulatory Visit: Payer: Self-pay | Admitting: *Deleted

## 2021-12-31 MED ORDER — METOPROLOL SUCCINATE ER 50 MG PO TB24: 50.0000 mg | ORAL_TABLET | Freq: Every day | ORAL | 3 refills | Status: DC

## 2021-12-31 MED ORDER — FENOFIBRATE MICRONIZED 67 MG PO CAPS: 67.0000 mg | ORAL_CAPSULE | Freq: Every day | ORAL | 3 refills | Status: DC

## 2022-01-27 DIAGNOSIS — Z7901 Long term (current) use of anticoagulants: Secondary | ICD-10-CM | POA: Diagnosis not present

## 2022-02-05 ENCOUNTER — Ambulatory Visit: Payer: Medicare HMO | Admitting: Urology

## 2022-02-07 ENCOUNTER — Encounter: Payer: Self-pay | Admitting: Urology

## 2022-03-03 DIAGNOSIS — Z7901 Long term (current) use of anticoagulants: Secondary | ICD-10-CM | POA: Diagnosis not present

## 2022-03-28 DIAGNOSIS — K5900 Constipation, unspecified: Secondary | ICD-10-CM | POA: Diagnosis not present

## 2022-03-28 DIAGNOSIS — Z86010 Personal history of colonic polyps: Secondary | ICD-10-CM | POA: Diagnosis not present

## 2022-03-28 DIAGNOSIS — R791 Abnormal coagulation profile: Secondary | ICD-10-CM | POA: Diagnosis not present

## 2022-03-28 DIAGNOSIS — K6289 Other specified diseases of anus and rectum: Secondary | ICD-10-CM | POA: Diagnosis not present

## 2022-03-28 DIAGNOSIS — Z7901 Long term (current) use of anticoagulants: Secondary | ICD-10-CM | POA: Diagnosis not present

## 2022-03-31 DIAGNOSIS — Z7901 Long term (current) use of anticoagulants: Secondary | ICD-10-CM | POA: Diagnosis not present

## 2022-04-10 DIAGNOSIS — Z961 Presence of intraocular lens: Secondary | ICD-10-CM | POA: Diagnosis not present

## 2022-04-10 DIAGNOSIS — E119 Type 2 diabetes mellitus without complications: Secondary | ICD-10-CM | POA: Diagnosis not present

## 2022-04-10 DIAGNOSIS — H18413 Arcus senilis, bilateral: Secondary | ICD-10-CM | POA: Diagnosis not present

## 2022-04-10 DIAGNOSIS — H02831 Dermatochalasis of right upper eyelid: Secondary | ICD-10-CM | POA: Diagnosis not present

## 2022-04-11 ENCOUNTER — Other Ambulatory Visit: Payer: Self-pay

## 2022-04-11 ENCOUNTER — Emergency Department (HOSPITAL_COMMUNITY): Payer: Worker's Compensation

## 2022-04-11 ENCOUNTER — Observation Stay (HOSPITAL_COMMUNITY)
Admission: EM | Admit: 2022-04-11 | Discharge: 2022-04-12 | Disposition: A | Discharge status: Home / Self Care | Payer: Worker's Compensation | Attending: Internal Medicine | Admitting: Internal Medicine

## 2022-04-11 ENCOUNTER — Encounter (HOSPITAL_COMMUNITY): Payer: Self-pay | Admitting: Internal Medicine

## 2022-04-11 DIAGNOSIS — S065XAA Traumatic subdural hemorrhage with loss of consciousness status unknown, initial encounter: Secondary | ICD-10-CM

## 2022-04-11 DIAGNOSIS — R519 Headache, unspecified: Secondary | ICD-10-CM | POA: Diagnosis not present

## 2022-04-11 DIAGNOSIS — Z66 Do not resuscitate: Secondary | ICD-10-CM | POA: Diagnosis not present

## 2022-04-11 DIAGNOSIS — S50311A Abrasion of right elbow, initial encounter: Secondary | ICD-10-CM

## 2022-04-11 DIAGNOSIS — Z6830 Body mass index (BMI) 30.0-30.9, adult: Secondary | ICD-10-CM | POA: Diagnosis not present

## 2022-04-11 DIAGNOSIS — I4891 Unspecified atrial fibrillation: Secondary | ICD-10-CM | POA: Insufficient documentation

## 2022-04-11 DIAGNOSIS — Z7984 Long term (current) use of oral hypoglycemic drugs: Secondary | ICD-10-CM | POA: Diagnosis not present

## 2022-04-11 DIAGNOSIS — Z86711 Personal history of pulmonary embolism: Secondary | ICD-10-CM | POA: Insufficient documentation

## 2022-04-11 DIAGNOSIS — S065X0A Traumatic subdural hemorrhage without loss of consciousness, initial encounter: Secondary | ICD-10-CM | POA: Diagnosis not present

## 2022-04-11 DIAGNOSIS — E7800 Pure hypercholesterolemia, unspecified: Secondary | ICD-10-CM | POA: Insufficient documentation

## 2022-04-11 DIAGNOSIS — I10 Essential (primary) hypertension: Secondary | ICD-10-CM | POA: Diagnosis not present

## 2022-04-11 DIAGNOSIS — I2510 Atherosclerotic heart disease of native coronary artery without angina pectoris: Secondary | ICD-10-CM | POA: Diagnosis present

## 2022-04-11 DIAGNOSIS — W19XXXA Unspecified fall, initial encounter: Secondary | ICD-10-CM | POA: Diagnosis not present

## 2022-04-11 DIAGNOSIS — Z951 Presence of aortocoronary bypass graft: Secondary | ICD-10-CM | POA: Diagnosis not present

## 2022-04-11 DIAGNOSIS — Z9889 Other specified postprocedural states: Secondary | ICD-10-CM

## 2022-04-11 DIAGNOSIS — Z79899 Other long term (current) drug therapy: Secondary | ICD-10-CM | POA: Diagnosis not present

## 2022-04-11 DIAGNOSIS — S0990XA Unspecified injury of head, initial encounter: Secondary | ICD-10-CM | POA: Diagnosis not present

## 2022-04-11 DIAGNOSIS — S50312A Abrasion of left elbow, initial encounter: Secondary | ICD-10-CM

## 2022-04-11 DIAGNOSIS — Z7901 Long term (current) use of anticoagulants: Secondary | ICD-10-CM | POA: Diagnosis not present

## 2022-04-11 DIAGNOSIS — E1169 Type 2 diabetes mellitus with other specified complication: Secondary | ICD-10-CM | POA: Insufficient documentation

## 2022-04-11 DIAGNOSIS — S0091XA Abrasion of unspecified part of head, initial encounter: Secondary | ICD-10-CM | POA: Diagnosis not present

## 2022-04-11 DIAGNOSIS — Z85828 Personal history of other malignant neoplasm of skin: Secondary | ICD-10-CM | POA: Insufficient documentation

## 2022-04-11 DIAGNOSIS — Z95818 Presence of other cardiac implants and grafts: Secondary | ICD-10-CM | POA: Insufficient documentation

## 2022-04-11 DIAGNOSIS — R58 Hemorrhage, not elsewhere classified: Secondary | ICD-10-CM | POA: Diagnosis not present

## 2022-04-11 DIAGNOSIS — Z043 Encounter for examination and observation following other accident: Secondary | ICD-10-CM | POA: Diagnosis not present

## 2022-04-11 DIAGNOSIS — W101XXA Fall (on)(from) sidewalk curb, initial encounter: Secondary | ICD-10-CM | POA: Diagnosis not present

## 2022-04-11 DIAGNOSIS — Z86718 Personal history of other venous thrombosis and embolism: Secondary | ICD-10-CM | POA: Diagnosis not present

## 2022-04-11 DIAGNOSIS — R2689 Other abnormalities of gait and mobility: Secondary | ICD-10-CM | POA: Diagnosis not present

## 2022-04-11 DIAGNOSIS — R42 Dizziness and giddiness: Secondary | ICD-10-CM | POA: Diagnosis not present

## 2022-04-11 DIAGNOSIS — E669 Obesity, unspecified: Secondary | ICD-10-CM | POA: Diagnosis present

## 2022-04-11 DIAGNOSIS — I483 Typical atrial flutter: Secondary | ICD-10-CM | POA: Diagnosis present

## 2022-04-11 DIAGNOSIS — E119 Type 2 diabetes mellitus without complications: Secondary | ICD-10-CM

## 2022-04-11 DIAGNOSIS — Z85820 Personal history of malignant melanoma of skin: Secondary | ICD-10-CM

## 2022-04-11 LAB — COMPREHENSIVE METABOLIC PANEL
ALT: 26 U/L (ref 0–44)
AST: 28 U/L (ref 15–41)
Albumin: 3.8 g/dL (ref 3.5–5.0)
Alkaline Phosphatase: 43 U/L (ref 38–126)
Anion gap: 11 (ref 5–15)
BUN: 29 mg/dL — ABNORMAL HIGH (ref 8–23)
CO2: 20 mmol/L — ABNORMAL LOW (ref 22–32)
Calcium: 9.3 mg/dL (ref 8.9–10.3)
Chloride: 107 mmol/L (ref 98–111)
Creatinine, Ser: 1.71 mg/dL — ABNORMAL HIGH (ref 0.61–1.24)
GFR, Estimated: 41 mL/min — ABNORMAL LOW (ref >60)
Glucose, Bld: 163 mg/dL — ABNORMAL HIGH (ref 70–99)
Potassium: 4.9 mmol/L (ref 3.5–5.1)
Sodium: 138 mmol/L (ref 135–145)
Total Bilirubin: 0.5 mg/dL (ref 0.3–1.2)
Total Protein: 6.7 g/dL (ref 6.5–8.1)

## 2022-04-11 LAB — LACTIC ACID, PLASMA: Lactic Acid, Venous: 1.6 mmol/L (ref 0.5–1.9)

## 2022-04-11 LAB — CBC
HCT: 44.6 % (ref 39.0–52.0)
Hemoglobin: 15 g/dL (ref 13.0–17.0)
MCH: 30.7 pg (ref 26.0–34.0)
MCHC: 33.6 g/dL (ref 30.0–36.0)
MCV: 91.4 fL (ref 80.0–100.0)
Platelets: 200 10*3/uL (ref 150–400)
RBC: 4.88 MIL/uL (ref 4.22–5.81)
RDW: 14.3 % (ref 11.5–15.5)
WBC: 7.6 10*3/uL (ref 4.0–10.5)
nRBC: 0 % (ref 0.0–0.2)

## 2022-04-11 LAB — TYPE AND SCREEN
ABO/RH(D): A POS
Antibody Screen: NEGATIVE

## 2022-04-11 LAB — GLUCOSE, CAPILLARY
Glucose-Capillary: 152 mg/dL — ABNORMAL HIGH (ref 70–99)
Glucose-Capillary: 114 mg/dL — ABNORMAL HIGH (ref 70–99)

## 2022-04-11 LAB — PROTIME-INR
INR: 2.7 — ABNORMAL HIGH (ref 0.8–1.2)
Prothrombin Time: 28.2 seconds — ABNORMAL HIGH (ref 11.4–15.2)

## 2022-04-11 LAB — HEMOGLOBIN A1C
Hgb A1c MFr Bld: 6.2 % — ABNORMAL HIGH (ref 4.8–5.6)
Mean Plasma Glucose: 131.24 mg/dL

## 2022-04-11 LAB — SAMPLE TO BLOOD BANK

## 2022-04-11 LAB — CBG MONITORING, ED
Glucose-Capillary: 162 mg/dL — ABNORMAL HIGH (ref 70–99)
Glucose-Capillary: 196 mg/dL — ABNORMAL HIGH (ref 70–99)

## 2022-04-11 LAB — SURGICAL PCR SCREEN
MRSA, PCR: NEGATIVE
Staphylococcus aureus: POSITIVE — AB

## 2022-04-11 LAB — ETHANOL: Alcohol, Ethyl (B): <10 mg/dL (ref <10)

## 2022-04-11 LAB — ABO/RH: ABO/RH(D): A POS

## 2022-04-11 MED ORDER — PANTOPRAZOLE SODIUM 40 MG PO TBEC
40.0000 mg | DELAYED_RELEASE_TABLET | Freq: Every day | ORAL | Status: DC
  Administered 2022-04-12: 40 mg via ORAL
  Filled 2022-04-11: qty 1

## 2022-04-11 MED ORDER — LACTATED RINGERS IV SOLN: INTRAVENOUS | Status: AC

## 2022-04-11 MED ORDER — BACITRACIN ZINC 500 UNIT/GM EX OINT
TOPICAL_OINTMENT | Freq: Once | CUTANEOUS | Status: AC
Start: 2022-04-11 — End: 2022-04-11
  Administered 2022-04-11: 1 via TOPICAL
  Filled 2022-04-11: qty 0.9

## 2022-04-11 MED ORDER — ONDANSETRON HCL 4 MG/2ML IJ SOLN
4.0000 mg | Freq: Once | INTRAMUSCULAR | Status: AC
  Administered 2022-04-11: 4 mg via INTRAVENOUS
  Filled 2022-04-11: qty 2

## 2022-04-11 MED ORDER — HYDRALAZINE HCL 20 MG/ML IJ SOLN: 5.0000 mg | INTRAMUSCULAR | Status: DC | PRN

## 2022-04-11 MED ORDER — ROSUVASTATIN CALCIUM 20 MG PO TABS
20.0000 mg | ORAL_TABLET | Freq: Every evening | ORAL | Status: DC
  Administered 2022-04-11: 20 mg via ORAL
  Filled 2022-04-11: qty 1

## 2022-04-11 MED ORDER — DOCUSATE SODIUM 100 MG PO CAPS
100.0000 mg | ORAL_CAPSULE | Freq: Two times a day (BID) | ORAL | Status: DC
  Administered 2022-04-11 – 2022-04-12 (×2): 100 mg via ORAL
  Filled 2022-04-11 (×2): qty 1

## 2022-04-11 MED ORDER — CITALOPRAM HYDROBROMIDE 20 MG PO TABS
40.0000 mg | ORAL_TABLET | Freq: Every morning | ORAL | Status: DC
  Administered 2022-04-12: 40 mg via ORAL
  Filled 2022-04-11: qty 2

## 2022-04-11 MED ORDER — METOPROLOL SUCCINATE ER 50 MG PO TB24
50.0000 mg | ORAL_TABLET | Freq: Every day | ORAL | Status: DC
  Administered 2022-04-12: 50 mg via ORAL
  Filled 2022-04-11: qty 1

## 2022-04-11 MED ORDER — MUPIROCIN 2 % EX OINT
1.0000 | TOPICAL_OINTMENT | Freq: Two times a day (BID) | CUTANEOUS | Status: DC
  Administered 2022-04-11 – 2022-04-12 (×3): 1 via NASAL
  Filled 2022-04-11 (×2): qty 22

## 2022-04-11 MED ORDER — IRBESARTAN 150 MG PO TABS
150.0000 mg | ORAL_TABLET | Freq: Every day | ORAL | Status: DC
  Administered 2022-04-11: 150 mg via ORAL
  Filled 2022-04-11: qty 1

## 2022-04-11 MED ORDER — FENOFIBRATE 54 MG PO TABS
54.0000 mg | ORAL_TABLET | Freq: Every day | ORAL | Status: DC
  Administered 2022-04-12: 54 mg via ORAL
  Filled 2022-04-11: qty 1

## 2022-04-11 MED ORDER — ONDANSETRON HCL 4 MG PO TABS: 4.0000 mg | ORAL_TABLET | Freq: Four times a day (QID) | ORAL | Status: DC | PRN

## 2022-04-11 MED ORDER — INSULIN ASPART 100 UNIT/ML IJ SOLN: 0.0000 [IU] | Freq: Every day | INTRAMUSCULAR | Status: DC

## 2022-04-11 MED ORDER — ONDANSETRON HCL 4 MG/2ML IJ SOLN: 4.0000 mg | Freq: Four times a day (QID) | INTRAMUSCULAR | Status: DC | PRN

## 2022-04-11 MED ORDER — OXYCODONE HCL 5 MG PO TABS
5.0000 mg | ORAL_TABLET | ORAL | Status: DC | PRN
  Administered 2022-04-11: 5 mg via ORAL
  Filled 2022-04-11: qty 1

## 2022-04-11 MED ORDER — ACETAMINOPHEN 325 MG PO TABS: 650.0000 mg | ORAL_TABLET | Freq: Four times a day (QID) | ORAL | Status: DC | PRN

## 2022-04-11 MED ORDER — ACETAMINOPHEN 650 MG RE SUPP: 650.0000 mg | Freq: Four times a day (QID) | RECTAL | Status: DC | PRN

## 2022-04-11 MED ORDER — POLYETHYLENE GLYCOL 3350 17 G PO PACK: 17.0000 g | PACK | Freq: Every day | ORAL | Status: DC | PRN

## 2022-04-11 MED ORDER — BISACODYL 5 MG PO TBEC: 5.0000 mg | DELAYED_RELEASE_TABLET | Freq: Every day | ORAL | Status: DC | PRN

## 2022-04-11 MED ORDER — SODIUM CHLORIDE 0.9% FLUSH
3.0000 mL | Freq: Two times a day (BID) | INTRAVENOUS | Status: DC
Start: 2022-04-11 — End: 2022-04-12
  Administered 2022-04-11 – 2022-04-12 (×2): 3 mL via INTRAVENOUS

## 2022-04-11 MED ORDER — INSULIN ASPART 100 UNIT/ML IJ SOLN
0.0000 [IU] | Freq: Three times a day (TID) | INTRAMUSCULAR | Status: DC
  Administered 2022-04-11: 3 [IU] via SUBCUTANEOUS
  Administered 2022-04-12: 2 [IU] via SUBCUTANEOUS
  Administered 2022-04-12: 3 [IU] via SUBCUTANEOUS

## 2022-04-11 MED ORDER — MORPHINE SULFATE (PF) 2 MG/ML IV SOLN: 2.0000 mg | INTRAVENOUS | Status: DC | PRN

## 2022-04-11 NOTE — ED Triage Notes (Signed)
Arrived via EMS: mechanical fall backwards; -LOC; + laceration and abrasion; on Warfarin d/t hx of blood clots, quad bypass and mx stents. A&O x 4; VSS; Fentanyl 100 mcg given en route for c/o headaches.

## 2022-04-11 NOTE — Consult Note (Signed)
Reason for Consult:SDH Referring Physician: EDP  Evan [name] is an 76 y.o. [name].   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 He apparently was walking backwards and tripped over a cone, hitting his head directly on the concrete. Reports headache, nausea, and dizziness. He has a history of a fib, bypass surgery, stent placement, and DVTs. He is currently on warfarin.   Past Medical History:  Diagnosis Date   Acquired dilation of ascending aorta and aortic root ([place])    a.) measurements by TTE in 04/2021 --> root 40 mm and ascending aorta 43 mm   Anginal pain (HCC)    Anxiety    Atrial flutter ([place]) 04/22/2021   a.) CHADS2VASc = 5 (age x 2, HTN, T2DM, CAD); b.) daily warfarin   Carotid disease, bilateral ([place])    a.) Doppler in 2019 --> 1-39% stenosis on RIGHT and 40-58% on LEFT   Cataracts, bilateral    Chronic anticoagulation    Warfarin   Chronic renal insufficiency    Coronary artery disease    a.) s/p 3v CABG in 1999   Depression    GERD (gastroesophageal reflux disease)    History of kidney stones    Hypercholesteremia    Hypertension    Kidney stone    Low testosterone    on TRT injections   Malignant melanoma ([place])    a.) face, nose, left leg; b.) Tx'd with chemotherapy + XRT at [facility]   Myocardial infarction [facility])    Pulmonary emboli ([place])    S/P CABG x 3 1999   a.) LIMA-LAD, SVG-RCA, SVG-diagnonal   Sleep apnea    not using nocturnal PAP therapy   T2DM (type 2 diabetes mellitus) ([place])     Past Surgical History:  Procedure Laterality Date   CARDIAC CATHETERIZATION     x3     last 1999   CARDIOVERSION N/A 08/12/2021   Procedure: CARDIOVERSION;  Surgeon: Fay Records, MD;  Location: [place];  Service: Cardiovascular;  Laterality: N/A;   CATARACT EXTRACTION Bilateral    COLONOSCOPY WITH PROPOFOL N/A 09/26/2014   Procedure: COLONOSCOPY WITH PROPOFOL;  Surgeon: [name], MD;   [facility];  Service: Endoscopy;  Laterality: N/A;   CORONARY ARTERY BYPASS GRAFT N/A 1999   3v; LIMA-LAD, SVG-RCA, SVG-diagonal   HOLEP-LASER ENUCLEATION OF THE PROSTATE WITH MORCELLATION N/A 06/07/2021   Procedure: HOLEP-LASER ENUCLEATION OF THE PROSTATE WITH MORCELLATION;  Surgeon: Billey Co, MD;  Location: [facility];  Service: Urology;  Laterality: N/A;   KNEE ARTHROSCOPY Left    LAPAROSCOPIC CHOLECYSTECTOMY     MASS EXCISION  04/15/2012   Procedure: EXCISION MASS;  Surgeon: [name], MD;  Location: [place];  Service: ENT;  Laterality: Left;  Left tonsil biopsy   melanomia     several skin ca removed-nose, left ankle, parotid gland left, right nodes-being followed annually..    Allergies  Allergen Reactions   Doxycycline Other (See Comments)    BPH can't void when taking    Penicillins Rash and Itching    Social History   Tobacco Use   Smoking status: Never   Smokeless tobacco: Never  Substance Use Topics   Alcohol use: No    No family history on file.   Review of Systems  Positive ROS: as above   All other systems have been reviewed and were otherwise negative with the exception of those  mentioned in the HPI and as above.  Objective: Vital signs in last 24 hours: Temp:  [97.7 F (36.5 C)] 97.7 F (36.5 C) (07/14 0911) Pulse Rate:  [61-70] 61 (07/14 1000) Resp:  [15-18] 16 (07/14 1000) BP: (136-158)/(Evan-103) 155/79 (07/14 1000) SpO2:  [95 %-97 %] 95 % (07/14 1000) Weight:  [95.3 kg] 95.3 kg (07/14 0913)  General Appearance: Alert, cooperative, no distress, appears stated age Head: Normocephalic, without obvious abnormality, atraumatic Eyes: PERRL, conjunctiva/corneas clear, EOM's intact, fundi benign, both eyes      Lungs: respirations unlabored Heart: Regular rate and rhythm Extremities: Extremities normal, atraumatic, no cyanosis or edema Pulses: 2+ and symmetric all extremities Skin: Skin color, texture, turgor normal, no rashes or  lesions  NEUROLOGIC:   Mental status: A&O x4, no aphasia, good attention span, Memory and fund of knowledge Motor Exam - grossly normal, normal tone and bulk Sensory Exam - grossly normal Reflexes: symmetric, no pathologic reflexes, No Hoffman's, No clonus Coordination - grossly normal Gait - not tested Balance - not tested Cranial Nerves: I: smell Not tested  II: visual acuity  OS: na    OD: na  II: visual fields Full to confrontation  II: pupils Equal, round, reactive to light  III,VII: ptosis None  III,IV,VI: extraocular muscles  Full ROM  V: mastication Normal  V: facial light touch sensation  Normal  V,VII: corneal reflex  Present  VII: facial muscle function - upper  Normal  VII: facial muscle function - lower Normal  VIII: hearing Not tested  IX: soft palate elevation  Normal  IX,X: gag reflex Present  XI: trapezius strength  5/5  XI: sternocleidomastoid strength 5/5  XI: neck flexion strength  5/5  XII: tongue strength  Normal    Data Review Lab Results  Component Value Date   WBC 7.6 04/11/2022   HGB 15.0 04/11/2022   HCT 44.6 04/11/2022   MCV 91.4 04/11/2022   PLT 200 04/11/2022   Lab Results  Component Value Date   NA 138 04/11/2022   K 4.9 04/11/2022   CL 107 04/11/2022   CO2 20 (L) 04/11/2022   BUN 29 (H) 04/11/2022   CREATININE 1.71 (H) 04/11/2022   GLUCOSE 163 (H) 04/11/2022   Lab Results  Component Value Date   INR 2.7 (H) 04/11/2022    Radiology: CT HEAD WO CONTRAST  Addendum Date: 04/11/2022   ADDENDUM REPORT: 04/11/2022 10:00 ADDENDUM: Study discussed by telephone with Dr. Aletta Edouard on 04/11/2022 at 0947 hours. Electronically Signed   By: Genevie Ann M.D.   On: 04/11/2022 10:00   Result Date: 04/11/2022 CLINICAL DATA:  76 year old [name] status post fall. EXAM: CT HEAD WITHOUT CONTRAST TECHNIQUE: Contiguous axial images were obtained from the base of the skull through the vertex without intravenous contrast. RADIATION DOSE REDUCTION: This  exam was performed according to the departmental dose-optimization program which includes automated exposure control, adjustment of the mA and/or kV according to patient size and/or use of iterative reconstruction technique. COMPARISON:  Brain MRI 09/26/2008. FINDINGS: Brain: Small volume para falcine subdural blood (series 3, image 24), up to 3-4 mm thickness. Similar small volume of subdural blood layering on the right tentorium (coronal image 47). No mass effect. Some underlying generalized cerebral volume loss. No definite intraventricular hemorrhage or other acute intracranial hemorrhage identified. No ventriculomegaly. No midline shift, mass effect, or evidence of intracranial mass lesion. No cortically based acute infarct identified. Gray-white matter differentiation is within normal limits. Vascular: Advanced calcified atherosclerosis. No suspicious  intracranial vascular hyperdensity. Skull: No fracture identified. Sinuses/Orbits: Evidence of a calcified chronic left maxillary sinus cyst (series 4, image 1) with mild new bilateral maxillary sinus fluid or mucosal thickening when compared to 2009. Other Visualized paranasal sinuses and mastoids are stable and well aerated. Tympanic cavities are clear. Other: Posterior midline scalp hematoma or contusion on series 4, image 40. Underlying calvarium appears intact. No other orbit or scalp soft tissue injury identified. IMPRESSION: 1. Small volume parafalcine and right tentorial subdural hemorrhage. No mass effect. 2. No other acute traumatic injury to the brain identified. 3. Posterior midline scalp hematoma or contusion without underlying skull fracture. Electronically Signed: By: Genevie Ann M.D. On: 04/11/2022 09:45   CT CERVICAL SPINE WO CONTRAST  Result Date: 04/11/2022 CLINICAL DATA:  76 year old [name] status post fall. EXAM: CT CERVICAL SPINE WITHOUT CONTRAST TECHNIQUE: Multidetector CT imaging of the cervical spine was performed without intravenous  contrast. Multiplanar CT image reconstructions were also generated. RADIATION DOSE REDUCTION: This exam was performed according to the departmental dose-optimization program which includes automated exposure control, adjustment of the mA and/or kV according to patient size and/or use of iterative reconstruction technique. COMPARISON:  Head CT today.  Brain MRI 09/26/2008. FINDINGS: Alignment: Straightening and mild reversal of cervical lordosis. Cervicothoracic junction alignment is within normal limits. Bilateral posterior element alignment is within normal limits. Skull base and vertebrae: Visualized skull base is intact. No atlanto-occipital dissociation. C1 and C2 appear intact and aligned. No acute osseous abnormality identified. Soft tissues and spinal canal: No prevertebral fluid or swelling. No visible canal hematoma. Evidence of bilateral parotidectomy, left neck dissection with multiple left neck surgical clips. Advanced cervical carotid calcified atherosclerosis. Disc levels: Partially calcified ligamentous hypertrophy about the odontoid. Disc and endplate degeneration maximal at C6-C7, but with possible developing interbody ankylosis there. Upper chest: Partially visible sternotomy. Grossly intact visible upper thoracic levels. Calcified aortic atherosclerosis. IMPRESSION: 1. No acute traumatic injury identified in the cervical spine. 2. Postoperative changes to the neck, evidence of prior left neck dissection. 3. Calcified atherosclerosis, Aortic Atherosclerosis (ICD10-I70.0). Electronically Signed   By: Genevie Ann M.D.   On: 04/11/2022 09:48    Assessment/Plan: 76 year old [name] presented to the ED after sustaining a fall and striking his head on concrete. CT head shows a small parafalcine SDH and right tentorial SDH with no midline shift or mass effect. He has quite a bit of brain atrophy. Would recommend medicine admission for observation and repeat head CT in the morning. Would not recommend  reversal of warfarin at this time. No need for neurosurgical intervention.    Ocie Cornfield [facility] 04/11/2022 10:58 AM

## 2022-04-11 NOTE — Evaluation (Signed)
Physical Therapy Evaluation Patient Details Name: Evan [name] MRN: [id number] DOB: [birth year] Today's Date: 04/11/2022  History of Present Illness  76 y.o. male presents to [facility] hospital on 7/14 after falling. Pt found to have small parafalcine SDH and right tentorial SDH , reporting nausea, HA, and dizziness. PMH includes anxiety, aflutter, depression, HTN, PE, CABG, DMII.  Clinical Impression  Pt presents to PT with mild deficits in gait, balance, and with intermittent reports of dizziness with lateral head movement. Pt reports chronic balance deficits although does not have a frequent history of falls. Pt is able to ambulate without physical assistance, tolerating multiple dynamic gait challenges well. Pt's reports of dizziness with head turns appear to subside with further mobility throughout session, PT provides education on need for caution initially when changing direction quickly or when distracted to avoid loss of balance. PT will continue to follow in an effort to improve stability.       Recommendations for follow up therapy are one component of a multi-disciplinary discharge planning process, led by the attending physician.  Recommendations may be updated based on patient status, additional functional criteria and insurance authorization.  Follow Up Recommendations Outpatient PT      Assistance Recommended at Discharge PRN  Patient can return home with the following  A little help with bathing/dressing/bathroom;A little help with walking and/or transfers;Help with stairs or ramp for entrance;Assist for transportation;Assistance with cooking/housework    Equipment Recommendations None recommended by PT  Recommendations for Other Services       Functional Status Assessment Patient has had a recent decline in their functional status and demonstrates the ability to make significant improvements in function in a reasonable and predictable amount of time.     Precautions /  Restrictions Precautions Precautions: Fall Restrictions Weight Bearing Restrictions: No      Mobility  Bed Mobility Overal bed mobility: Modified Independent                  Transfers Overall transfer level: Needs assistance Equipment used: None Transfers: Sit to/from Stand Sit to Stand: Supervision                Ambulation/Gait Ambulation/Gait assistance: Supervision Gait Distance (Feet): 400 Feet Assistive device: None Gait Pattern/deviations: Step-through pattern Gait velocity: functional Gait velocity interpretation: 1.31 - 2.62 ft/sec, indicative of limited community ambulator   General Gait Details: pt with mild lateral drift with head turns, also tolerates changes in gait speed, abrupt stops, backward walking, all without significant loss of balance  Stairs Stairs: Yes Stairs assistance: Supervision Stair Management: One rail Right, Step to pattern Number of Stairs: 3    Wheelchair Mobility    Modified Rankin (Stroke Patients Only) Modified Rankin (Stroke Patients Only) Pre-Morbid Rankin Score: No symptoms Modified Rankin: Moderately severe disability     Balance Overall balance assessment: Needs assistance Sitting-balance support: No upper extremity supported, Feet supported Sitting balance-Leahy Scale: Good     Standing balance support: No upper extremity supported, During functional activity Standing balance-Leahy Scale: Good                               Pertinent Vitals/Pain Pain Assessment Pain Assessment: 0-10 Pain Score: 5  Pain Location: jaw Pain Descriptors / Indicators: Aching Pain Intervention(s): Monitored during session    Home Living Family/patient expects to be discharged to:: Private residence Living Arrangements: Spouse/significant other Available Help at Discharge: Family;Available PRN/intermittently Type of Home:  House Home Access: Stairs to enter;Ramped entrance Entrance Stairs-Rails:  Right Entrance Stairs-Number of Steps: 2   Home Layout: One level Home Equipment: None      Prior Function Prior Level of Function : Independent/Modified Independent             Mobility Comments: works at [place]: Right    Extremity/Trunk Assessment   Upper Extremity Assessment Upper Extremity Assessment: Overall WFL for tasks assessed    Lower Extremity Assessment Lower Extremity Assessment: Overall WFL for tasks assessed    Cervical / Trunk Assessment Cervical / Trunk Assessment: Normal  Communication   Communication: No difficulties  Cognition Arousal/Alertness: Awake/alert Behavior During Therapy: WFL for tasks assessed/performed Overall Cognitive Status: Within Functional Limits for tasks assessed                                          General Comments General comments (skin integrity, edema, etc.): VSS on RA, pursuits WFL. pt with difficulty following commands for VOR, with corrective saccades but no report of dizziness    Exercises     Assessment/Plan    PT Assessment Patient needs continued PT services  PT Problem List Decreased balance;Decreased activity tolerance;Decreased mobility       PT Treatment Interventions DME instruction;Gait training;Stair training;Balance training;Neuromuscular re-education;Therapeutic activities;Therapeutic exercise;Patient/family education    PT Goals (Current goals can be found in the Care Plan section)  Acute Rehab PT Goals Patient Stated Goal: to go home PT Goal Formulation: With patient/family Time For Goal Achievement: 04/25/22 Potential to Achieve Goals: Good Additional Goals Additional Goal #1: Pt will score >19/24 on the DGI to indicate a reduced risk for falls Additional Goal #2: Pt will report no dizziness when ambulating for community distances and tolerating dynamic gait challenges    Frequency Min 3X/week     Co-evaluation                AM-PAC PT "6 Clicks" Mobility  Outcome Measure Help needed turning from your back to your side while in a flat bed without using bedrails?: None Help needed moving from lying on your back to sitting on the side of a flat bed without using bedrails?: None Help needed moving to and from a bed to a chair (including a wheelchair)?: A Little Help needed standing up from a chair using your arms (e.g., wheelchair or bedside chair)?: A Little Help needed to walk in hospital room?: A Little Help needed climbing 3-5 steps with a railing? : A Little 6 Click Score: 20    End of Session   Activity Tolerance: Patient tolerated treatment well Patient left: in bed;with call bell/phone within reach;with bed alarm set;with family/visitor present Nurse Communication: Mobility status PT Visit Diagnosis: Other abnormalities of gait and mobility (R26.89);Dizziness and giddiness (R42)    Time: 4656-8127 PT Time Calculation (min) (ACUTE ONLY): 23 min   Charges:   PT Evaluation $PT Eval Low Complexity: 1 Low          Zenaida Niece, PT, DPT Acute Rehabilitation Office [phone]   Zenaida Niece 04/11/2022, 5:20 PM

## 2022-04-11 NOTE — TOC Progression Note (Signed)
Transition of Care [facility]) - Progression Note    Patient Details  Name: Evan [name] MRN: [id number] Date of Birth: 10-19-1945  Transition of Care [facility]) CM/SW [place], RN Phone Number:[phone]  04/11/2022, 3:29 PM  Clinical Narrative:    Transition of Care [facility]) Screening Note   Patient Details  Name: Evan [name] Date of Birth: Mar 25, 1946   Transition of Care [facility]) CM/SW Contact:    Angelita Ingles, RN Phone Number: 04/11/2022, 3:30 PM    Transition of Care Department [facility]) has reviewed patient and no TOC needs have been identified at this time. We will continue to monitor patient advancement through interdisciplinary progression rounds.           Expected Discharge Plan and Services                                                 Social Determinants of Health (SDOH) Interventions    Readmission Risk Interventions     No data to display

## 2022-04-11 NOTE — H&P (Signed)
History and Physical    Patient: Evan [name] YNW:[id number] DOB: 1946/05/15 DOA: 04/11/2022 DOS: the patient was seen and examined on 04/11/2022 PCP: Josetta Huddle, MD  Patient coming from: Home - lives with wife; NOK: Wife, Evan [name], [phone]   Chief Complaint: Lytle Michaels  HPI: Evan [name] is a 76 y.o. male with medical history significant of CAD s/p CABG; HTN; HLD; melanoma; afib on Coumadin; PE; DM; and OSA not on CPAP presenting with a fall.  He mis-stepped around a traffic cone and fell backwards, landing on his head.  He has some balance issues but generally can catch himself.  He feels dizzy, mildly nauseated periodically, has a continuous headaches on the B frontal regions (sides, not center).  He was feeling fine this AM, wasn't wanting to go to work.      ER Course:  Afib on Coumadin.  Works at [employer], tripped and fell, hit head, no LOC.  Awake and alert, mild nausea.  Head CT with small SDH.  Neurosurgery does not recommend [facility] reversal, will consult.  Likely repeat CT in AM.     Review of Systems: As mentioned in the history of present illness. All other systems reviewed and are negative. Past Medical History:  Diagnosis Date   Acquired dilation of ascending aorta and aortic root ([place])    a.) measurements by TTE in 04/2021 --> root 40 mm and ascending aorta 43 mm   Anginal pain (HCC)    Anxiety    Atrial flutter ([place]) 04/22/2021   a.) CHADS2VASc = 5 (age x 2, HTN, T2DM, CAD); b.) daily warfarin   Carotid disease, bilateral ([place])    a.) Doppler in 2019 --> 1-39% stenosis on RIGHT and 40-58% on LEFT   Cataracts, bilateral    Chronic anticoagulation    Warfarin   Chronic renal insufficiency    Coronary artery disease    a.) s/p 3v CABG in 1999   Depression    GERD (gastroesophageal reflux disease)    History of kidney stones    Hypercholesteremia    Hypertension    Kidney stone    Low testosterone    on TRT injections   Malignant melanoma  ([place])    a.) face, nose, left leg; b.) Tx'd with chemotherapy + XRT at [facility]   Myocardial infarction [facility])    Pulmonary emboli ([place])    S/P CABG x 3 1999   a.) LIMA-LAD, SVG-RCA, SVG-diagnonal   Sleep apnea    not using nocturnal PAP therapy   T2DM (type 2 diabetes mellitus) ([place])    Past Surgical History:  Procedure Laterality Date   CARDIAC CATHETERIZATION     x3     last 1999   CARDIOVERSION N/A 08/12/2021   Procedure: CARDIOVERSION;  Surgeon: Fay Records, MD;  Location: [facility];  Service: Cardiovascular;  Laterality: N/A;   CATARACT EXTRACTION Bilateral    COLONOSCOPY WITH PROPOFOL N/A 09/26/2014   Procedure: COLONOSCOPY WITH PROPOFOL;  Surgeon: [name], MD;  Location: [facility];  Service: Endoscopy;  Laterality: N/A;   CORONARY ARTERY BYPASS GRAFT N/A 1999   3v; LIMA-LAD, SVG-RCA, SVG-diagonal   HOLEP-LASER ENUCLEATION OF THE PROSTATE WITH MORCELLATION N/A 06/07/2021   Procedure: HOLEP-LASER ENUCLEATION OF THE PROSTATE WITH MORCELLATION;  Surgeon: Billey Co, MD;  Location: [facility] ORS;  Service: Urology;  Laterality: N/A;   KNEE ARTHROSCOPY Left    LAPAROSCOPIC CHOLECYSTECTOMY     MASS EXCISION  04/15/2012   Procedure: EXCISION MASS;  Surgeon: [name], MD;  Location: [facility] OR;  Service: ENT;  Laterality: Left;  Left tonsil biopsy   melanomia     several skin ca removed-nose, left ankle, parotid gland left, right nodes-being followed annually..   Social History:  reports that he has never smoked. He has never used smokeless tobacco. He reports that he does not drink alcohol and does not use drugs.  Allergies  Allergen Reactions   Doxycycline Other (See Comments)    BPH can't void when taking    Penicillins Rash and Itching    History reviewed. No pertinent family history.  Prior to Admission medications   Medication Sig Start Date End Date Taking? Authorizing Provider  ANUCORT-HC 25 MG suppository Place 25 mg rectally daily as needed. 12/19/20    [provider]  cholecalciferol (VITAMIN D) 1000 UNITS tablet Take 2,000 Units by mouth every morning.     [provider]  citalopram (CELEXA) 40 MG tablet Take 40 mg by mouth every morning.    [provider]  co-enzyme Q-10 50 MG capsule Take 100 mg by mouth every morning.    [provider]  Cranberry 500 MG CAPS Take 500 mg by mouth daily.    [provider]  fenofibrate micronized (LOFIBRA) 67 MG capsule Take 1 capsule (67 mg total) by mouth daily. 12/31/21   Jerline Pain, MD  FLAXSEED, LINSEED, PO Take 2 tablets by mouth 2 (two) times daily.    [provider]  fluticasone (CUTIVATE) 0.05 % cream Apply topically 2 (two) times daily as needed. 10/22/20   [provider]  folic acid (FOLVITE) 720 MCG tablet Take 400 mcg by mouth daily.    [provider]  gabapentin (NEURONTIN) 300 MG capsule Take 1 capsule by mouth daily. 01/27/18   [provider]  glyBURIDE (DIABETA) 5 MG tablet Take 5 mg by mouth every evening.    [provider]  ketoconazole (NIZORAL) 2 % shampoo ketoconazole 2 % shampoo  WASH SCALP, CHEST, AND EARS 2 TO 3 TIMES A WEEK    [provider]  metFORMIN (GLUCOPHAGE-XR) 500 MG 24 hr tablet Take 1,000 mg by mouth 2 (two) times daily. 02/26/21   [provider]  metoprolol succinate (TOPROL-XL) 50 MG 24 hr tablet Take 1 tablet (50 mg total) by mouth daily. With or immediately following a meal 12/31/21   Jerline Pain, MD  Multiple Vitamin (MULITIVITAMIN WITH MINERALS) TABS Take 1 tablet by mouth every evening.    [provider]  nitroGLYCERIN (NITROSTAT) 0.4 MG SL tablet Place 0.4 mg under the tongue every 5 (five) minutes as needed for chest pain. 12/14/20   [provider]  omega-3 acid ethyl esters (LOVAZA) 1 g capsule TAKE 2 CAPSULES TWICE A DAY 03/11/21   Jerline Pain, MD  pantoprazole (PROTONIX) 40 MG tablet Take 40 mg by mouth daily.    [provider]  pioglitazone (ACTOS) 30 MG tablet Take 30 mg by mouth every morning.    [provider]  potassium gluconate 595 MG TABS tablet Take 90 mg by mouth daily.     [provider]  rosuvastatin (CRESTOR) 20 MG tablet Take 20 mg by mouth every evening.    [provider]  testosterone cypionate (DEPOTESTOTERONE CYPIONATE) 100 MG/ML injection Inject 150 mg into the muscle every 14 (fourteen) days. For IM use only    [provider]  valsartan (DIOVAN) 160 MG tablet Take 80 mg by mouth  every morning.    [provider]  vitamin C (ASCORBIC ACID) 500 MG tablet Take 1,000 mg by mouth every morning.    [provider]  warfarin (COUMADIN) 5 MG tablet Take 5 mg by mouth every evening. As directed--1/2 tab every Monday, Wednesday, and Friday; 1 tab all other days    [provider]    Physical Exam: Vitals:   04/11/22 1230 04/11/22 1302 04/11/22 1400 04/11/22 1532  BP: 114/66 109/72 (!) 165/85 (!) 143/72  Pulse: (!) 58 (!) 58 (!) 59 61  Resp: '18 18 16 17  '$ Temp:  97.8 F (36.6 C) 98.7 F (37.1 C) 97.7 F (36.5 C)  TempSrc:  Oral Oral Oral  SpO2: 95% 100%  100%  Weight:      Height:       General:  Appears calm and comfortable and is in NAD; posterior scalp abrasion without need for intervention Eyes:  PERRL, EOMI, normal lids, iris ENT:  grossly normal hearing, lips & tongue, mmm Neck:  no LAD, masses or thyromegaly Cardiovascular:  RRR, no m/r/g. No LE edema.  Respiratory:   CTA bilaterally with no wheezes/rales/rhonchi.  Normal respiratory effort. Abdomen:  soft, NT, ND Skin:  no rash or induration seen on limited exam; scattered abrasions/excoriations Musculoskeletal:  grossly normal tone BUE/BLE, good ROM, no bony abnormality Psychiatric:  grossly normal mood and affect, speech fluent and appropriate, AOx3 Neurologic:  CN 2-12 grossly intact acutely but with left lower face/neck chronic changes from prior surgery,  moves all extremities in coordinated fashion   Radiological Exams on Admission: Independently reviewed - see discussion in A/P where applicable  CT HEAD WO CONTRAST  Addendum Date: 04/11/2022   ADDENDUM REPORT: 04/11/2022 10:00 ADDENDUM: Study discussed by telephone with Dr. Aletta Edouard on 04/11/2022 at 0947 hours. Electronically Signed   By: Genevie Ann M.D.   On: 04/11/2022 10:00   Result Date: 04/11/2022 CLINICAL DATA:  76 year old male status post fall. EXAM: CT HEAD WITHOUT CONTRAST TECHNIQUE: Contiguous axial images were obtained from the base of the skull through the vertex without intravenous contrast. RADIATION DOSE REDUCTION: This exam was performed according to the departmental dose-optimization program which includes automated exposure control, adjustment of the mA and/or kV according to patient size and/or use of iterative reconstruction technique. COMPARISON:  Brain MRI 09/26/2008. FINDINGS: Brain: Small volume para falcine subdural blood (series 3, image 24), up to 3-4 mm thickness. Similar small volume of subdural blood layering on the right tentorium (coronal image 47). No mass effect. Some underlying generalized cerebral volume loss. No definite intraventricular hemorrhage or other acute intracranial hemorrhage identified. No ventriculomegaly. No midline shift, mass effect, or evidence of intracranial mass lesion. No cortically based acute infarct identified. Gray-white matter differentiation is within normal limits. Vascular: Advanced calcified atherosclerosis. No suspicious intracranial vascular hyperdensity. Skull: No fracture identified. Sinuses/Orbits: Evidence of a calcified chronic left maxillary sinus cyst (series 4, image 1) with mild new bilateral maxillary sinus fluid or mucosal thickening when compared to 2009. Other Visualized paranasal sinuses and mastoids are stable and well aerated. Tympanic cavities are clear. Other: Posterior midline scalp hematoma or contusion on series  4, image 40. Underlying calvarium appears intact. No other orbit or scalp soft tissue injury identified. IMPRESSION: 1. Small volume parafalcine and right tentorial subdural hemorrhage. No mass effect. 2. No other acute traumatic injury to the brain identified. 3. Posterior midline scalp hematoma or contusion without underlying skull fracture. Electronically Signed: By: Genevie Ann M.D. On: 04/11/2022  09:45   CT CERVICAL SPINE WO CONTRAST  Result Date: 04/11/2022 CLINICAL DATA:  76 year old male status post fall. EXAM: CT CERVICAL SPINE WITHOUT CONTRAST TECHNIQUE: Multidetector CT imaging of the cervical spine was performed without intravenous contrast. Multiplanar CT image reconstructions were also generated. RADIATION DOSE REDUCTION: This exam was performed according to the departmental dose-optimization program which includes automated exposure control, adjustment of the mA and/or kV according to patient size and/or use of iterative reconstruction technique. COMPARISON:  Head CT today.  Brain MRI 09/26/2008. FINDINGS: Alignment: Straightening and mild reversal of cervical lordosis. Cervicothoracic junction alignment is within normal limits. Bilateral posterior element alignment is within normal limits. Skull base and vertebrae: Visualized skull base is intact. No atlanto-occipital dissociation. C1 and C2 appear intact and aligned. No acute osseous abnormality identified. Soft tissues and spinal canal: No prevertebral fluid or swelling. No visible canal hematoma. Evidence of bilateral parotidectomy, left neck dissection with multiple left neck surgical clips. Advanced cervical carotid calcified atherosclerosis. Disc levels: Partially calcified ligamentous hypertrophy about the odontoid. Disc and endplate degeneration maximal at C6-C7, but with possible developing interbody ankylosis there. Upper chest: Partially visible sternotomy. Grossly intact visible upper thoracic levels. Calcified aortic atherosclerosis.  IMPRESSION: 1. No acute traumatic injury identified in the cervical spine. 2. Postoperative changes to the neck, evidence of prior left neck dissection. 3. Calcified atherosclerosis, Aortic Atherosclerosis (ICD10-I70.0). Electronically Signed   By: Genevie Ann M.D.   On: 04/11/2022 09:48    EKG: Independently reviewed.  NSR with rate 67; nonspecific ST changes with no evidence of acute ischemia; NSCSLT   Labs on Admission: I have personally reviewed the available labs and imaging studies at the time of the admission.  Pertinent labs:  CO2 20 Glucose 163 BUN 29/Creatinine 1.71/GFR 41 Lactate 1.6 Normal CBC INR 2.7 ETOH <10     Assessment and Plan: Principal Problem:   SDH (subdural hematoma) (HCC) Active Problems:   Coronary artery disease   Pure hypercholesterolemia   Essential hypertension, benign   Typical atrial flutter ([place])   Fall (on)(from) sidewalk curb, initial encounter   History of melanoma excision   Diabetes mellitus type 2 in obese ([place])   DNR (do not resuscitate)    Fall, SDH -Patient with apparent mechanical fall reported while patient was at work -CT with small parafalcine and R tentorial SDH without mass effect -neurosurgery has seen him and he does not appear to need surgical intervention at this time -For now, will observe in neuro-progressive unit -Repeat CT tomorrow  -Per neurosurgery, he does not need reversal of Coumadin at this time and it will likely need to be held for at least 3 days  Afib, on AC -Continue Toprol XL for rate control -Hold Coumadin for now given brain bleed  CAD -s/p CABG -No c/o CP at this time -He is not currently on ASA given that he is on Coumadin  HTN -Continue metoprolol, valsartan  HLD -Continue fenofibrate, Crestor -Hold Lovaza  Melanoma -s/p L facial and neck dissection -No known active surgery  DM -A1c is 6.2, indicating good control -hold glyburide, metformin, pioglitazone -Cover with moderate-scale SSI    OSA -Not on CPAP  DNR -I have discussed code status with the patient and his son and  they are in agreement that the patient would not desire resuscitation and would prefer to die a natural death should that situation arise. -He will need a gold out of facility DNR form at the time of discharge  Advance Care Planning:   Code Status: DNR   Consults: Neurosurgery; PT/OT  DVT Prophylaxis: SCDs  Family Communication: Son was present throughout evaluation  Severity of Illness: The appropriate patient status for this patient is OBSERVATION. Observation status is judged to be reasonable and necessary in order to provide the required intensity of service to ensure the patient's safety. The patient's presenting symptoms, physical exam findings, and initial radiographic and laboratory data in the context of their medical condition is felt to place them at decreased risk for further clinical deterioration. Furthermore, it is anticipated that the patient will be medically stable for discharge from the hospital within 2 midnights of admission.   Author: Karmen Bongo, MD 04/11/2022 3:59 PM  For on call review [email or web address].

## 2022-04-11 NOTE — ED Notes (Signed)
Admitting and Neurosurgery services at bedside;

## 2022-04-11 NOTE — ED Provider Notes (Signed)
[facility] EMERGENCY DEPARTMENT Provider Note   CSN: [id number] Arrival date & time: 04/11/22  0901     History  Chief Complaint  Patient presents with   Evan [name] is a 76 y.o. male.  He is presenting as a level 2 fall.  Has a history of a flutter on warfarin.  He was at a car show and was walking backwards, tripped over a cone and fell and struck his head.  No loss of consciousness.  Complaining of pain in his head and some abrasions to his elbows.  No nausea or vomiting no chest pain or shortness of breath.  EMS gave some fentanyl for pain. The history is provided by the patient and the EMS personnel.  Fall This is a new problem. The current episode started less than 1 hour ago. The problem has not changed since onset.Associated symptoms include headaches. Pertinent negatives include no chest pain, no abdominal pain and no shortness of breath. Nothing aggravates the symptoms. Nothing relieves the symptoms. He has tried nothing for the symptoms. The treatment provided no relief.       Home Medications Prior to Admission medications   Medication Sig Start Date End Date Taking? Authorizing Provider  ANUCORT-HC 25 MG suppository Place 25 mg rectally daily as needed. 12/19/20   [provider]  cholecalciferol (VITAMIN D) 1000 UNITS tablet Take 2,000 Units by mouth every morning.     [provider]  citalopram (CELEXA) 40 MG tablet Take 40 mg by mouth every morning.    [provider]  co-enzyme Q-10 50 MG capsule Take 100 mg by mouth every morning.    [provider]  Cranberry 500 MG CAPS Take 500 mg by mouth daily.    [provider]  fenofibrate micronized (LOFIBRA) 67 MG capsule Take 1 capsule (67 mg total) by mouth daily. 12/31/21   Jerline Pain, MD  FLAXSEED, LINSEED, PO Take 2 tablets by mouth 2 (two) times daily.    [provider]  fluticasone (CUTIVATE) 0.05 % cream Apply topically 2  (two) times daily as needed. 10/22/20   [provider]  folic acid (FOLVITE) 563 MCG tablet Take 400 mcg by mouth daily.    [provider]  gabapentin (NEURONTIN) 300 MG capsule Take 1 capsule by mouth daily. 01/27/18   [provider]  glyBURIDE (DIABETA) 5 MG tablet Take 5 mg by mouth every evening.    [provider]  ketoconazole (NIZORAL) 2 % shampoo ketoconazole 2 % shampoo  WASH SCALP, CHEST, AND EARS 2 TO 3 TIMES A WEEK    [provider]  metFORMIN (GLUCOPHAGE-XR) 500 MG 24 hr tablet Take 1,000 mg by mouth 2 (two) times daily. 02/26/21   [provider]  metoprolol succinate (TOPROL-XL) 50 MG 24 hr tablet Take 1 tablet (50 mg total) by mouth daily. With or immediately following a meal 12/31/21   Jerline Pain, MD  Multiple Vitamin (MULITIVITAMIN WITH MINERALS) TABS Take 1 tablet by mouth every evening.    [provider]  nitroGLYCERIN (NITROSTAT) 0.4 MG SL tablet Place 0.4 mg under the tongue every 5 (five) minutes as needed for chest pain. 12/14/20   [provider]  omega-3 acid ethyl esters (LOVAZA) 1 g capsule TAKE 2 CAPSULES TWICE A DAY 03/11/21   Jerline Pain, MD  pantoprazole (PROTONIX) 40 MG tablet Take 40 mg by mouth daily.    [provider]  pioglitazone (  ACTOS) 30 MG tablet Take 30 mg by mouth every morning.    [provider]  potassium gluconate 595 MG TABS tablet Take 90 mg by mouth daily.     [provider]  rosuvastatin (CRESTOR) 20 MG tablet Take 20 mg by mouth every evening.    [provider]  testosterone cypionate (DEPOTESTOTERONE CYPIONATE) 100 MG/ML injection Inject 150 mg into the muscle every 14 (fourteen) days. For IM use only    [provider]  valsartan (DIOVAN) 160 MG tablet Take 80 mg by mouth every morning.    [provider]  vitamin C (ASCORBIC ACID) 500 MG tablet Take 1,000 mg by mouth every morning.    [provider]   warfarin (COUMADIN) 5 MG tablet Take 5 mg by mouth every evening. As directed--1/2 tab every Monday, Wednesday, and Friday; 1 tab all other days    [provider]      Allergies    Doxycycline and Penicillins    Review of Systems   Review of Systems  Constitutional:  Negative for fever.  Eyes:  Negative for visual disturbance.  Respiratory:  Negative for shortness of breath.   Cardiovascular:  Negative for chest pain.  Gastrointestinal:  Negative for abdominal pain.  Musculoskeletal:  Negative for neck pain.  Skin:  Positive for wound.  Neurological:  Positive for headaches.    Physical Exam Updated Vital Signs BP (!) 143/72 (BP Location: Right Arm)   Pulse 61   Temp 97.7 F (36.5 C) (Oral)   Resp 17   Ht '5\' 10"'$  (1.778 m)   Wt 95.3 kg   SpO2 100%   BMI 30.13 kg/m  Physical Exam Vitals and nursing note reviewed.  Constitutional:      General: He is not in acute distress.    Appearance: Normal appearance. He is well-developed.  HENT:     Head: Normocephalic.     Comments: He has a wound to the back of his head Eyes:     Conjunctiva/sclera: Conjunctivae normal.  Cardiovascular:     Rate and Rhythm: Normal rate and regular rhythm.     Heart sounds: No murmur heard. Pulmonary:     Effort: Pulmonary effort is normal. No respiratory distress.     Breath sounds: Normal breath sounds.  Abdominal:     Palpations: Abdomen is soft.     Tenderness: There is no abdominal tenderness. There is no guarding or rebound.  Musculoskeletal:        General: No tenderness or deformity. Normal range of motion.     Cervical back: Neck supple. No tenderness.     Right lower leg: No edema.     Left lower leg: No edema.     Comments: He has a few small abrasions on his right elbow and a skin tear and abrasion to his left elbow.  Full range of motion of upper and lower extremities without any pain or limitations.  Skin:    General: Skin is warm and dry.     Capillary Refill:  Capillary refill takes less than 2 seconds.  Neurological:     General: No focal deficit present.     Mental Status: He is alert.     Cranial Nerves: No cranial nerve deficit.     Sensory: No sensory deficit.     Motor: No weakness.     ED Results / Procedures / Treatments   Labs (all labs ordered are listed, but only abnormal results are displayed)  Labs Reviewed  SURGICAL PCR SCREEN - Abnormal; Notable for the following components:      Result Value   Staphylococcus aureus POSITIVE (*)    All other components within normal limits  COMPREHENSIVE METABOLIC PANEL - Abnormal; Notable for the following components:   CO2 20 (*)    Glucose, Bld 163 (*)    BUN 29 (*)    Creatinine, Ser 1.71 (*)    GFR, Estimated 41 (*)    All other components within normal limits  PROTIME-INR - Abnormal; Notable for the following components:   Prothrombin Time 28.2 (*)    INR 2.7 (*)    All other components within normal limits  HEMOGLOBIN A1C - Abnormal; Notable for the following components:   Hgb A1c MFr Bld 6.2 (*)    All other components within normal limits  GLUCOSE, CAPILLARY - Abnormal; Notable for the following components:   Glucose-Capillary 152 (*)    All other components within normal limits  CBG MONITORING, ED - Abnormal; Notable for the following components:   Glucose-Capillary 162 (*)    All other components within normal limits  CBG MONITORING, ED - Abnormal; Notable for the following components:   Glucose-Capillary 196 (*)    All other components within normal limits  CBC  ETHANOL  LACTIC ACID, PLASMA  BASIC METABOLIC PANEL  CBC  SAMPLE TO BLOOD BANK  TYPE AND SCREEN  ABO/RH    EKG EKG Interpretation  Date/Time:  Friday April 11 2022 09:20:53 EDT Ventricular Rate:  67 PR Interval:  61 QRS Duration: 138 QT Interval:  464 QTC Calculation: 490 R Axis:   -48 Text Interpretation: Sinus rhythm Ventricular premature complex Short PR interval RBBB and LAFB Anteroseptal  infarct, old No significant change since last tracing Confirmed by Aletta Edouard [phone]) on 04/11/2022 9:28:55 AM  Radiology CT HEAD WO CONTRAST  Addendum Date: 04/11/2022   ADDENDUM REPORT: 04/11/2022 10:00 ADDENDUM: Study discussed by telephone with Dr. Aletta Edouard on 04/11/2022 at 0947 hours. Electronically Signed   By: Genevie Ann M.D.   On: 04/11/2022 10:00   Result Date: 04/11/2022 CLINICAL DATA:  76 year old male status post fall. EXAM: CT HEAD WITHOUT CONTRAST TECHNIQUE: Contiguous axial images were obtained from the base of the skull through the vertex without intravenous contrast. RADIATION DOSE REDUCTION: This exam was performed according to the departmental dose-optimization program which includes automated exposure control, adjustment of the mA and/or kV according to patient size and/or use of iterative reconstruction technique. COMPARISON:  Brain MRI 09/26/2008. FINDINGS: Brain: Small volume para falcine subdural blood (series 3, image 24), up to 3-4 mm thickness. Similar small volume of subdural blood layering on the right tentorium (coronal image 47). No mass effect. Some underlying generalized cerebral volume loss. No definite intraventricular hemorrhage or other acute intracranial hemorrhage identified. No ventriculomegaly. No midline shift, mass effect, or evidence of intracranial mass lesion. No cortically based acute infarct identified. Gray-white matter differentiation is within normal limits. Vascular: Advanced calcified atherosclerosis. No suspicious intracranial vascular hyperdensity. Skull: No fracture identified. Sinuses/Orbits: Evidence of a calcified chronic left maxillary sinus cyst (series 4, image 1) with mild new bilateral maxillary sinus fluid or mucosal thickening when compared to 2009. Other Visualized paranasal sinuses and mastoids are stable and well aerated. Tympanic cavities are clear. Other: Posterior midline scalp hematoma or contusion on series 4, image 40. Underlying  calvarium appears intact. No other orbit or scalp soft tissue injury identified. IMPRESSION: 1. Small volume parafalcine and right tentorial subdural hemorrhage.  No mass effect. 2. No other acute traumatic injury to the brain identified. 3. Posterior midline scalp hematoma or contusion without underlying skull fracture. Electronically Signed: By: Genevie Ann M.D. On: 04/11/2022 09:45   CT CERVICAL SPINE WO CONTRAST  Result Date: 04/11/2022 CLINICAL DATA:  76 year old male status post fall. EXAM: CT CERVICAL SPINE WITHOUT CONTRAST TECHNIQUE: Multidetector CT imaging of the cervical spine was performed without intravenous contrast. Multiplanar CT image reconstructions were also generated. RADIATION DOSE REDUCTION: This exam was performed according to the departmental dose-optimization program which includes automated exposure control, adjustment of the mA and/or kV according to patient size and/or use of iterative reconstruction technique. COMPARISON:  Head CT today.  Brain MRI 09/26/2008. FINDINGS: Alignment: Straightening and mild reversal of cervical lordosis. Cervicothoracic junction alignment is within normal limits. Bilateral posterior element alignment is within normal limits. Skull base and vertebrae: Visualized skull base is intact. No atlanto-occipital dissociation. C1 and C2 appear intact and aligned. No acute osseous abnormality identified. Soft tissues and spinal canal: No prevertebral fluid or swelling. No visible canal hematoma. Evidence of bilateral parotidectomy, left neck dissection with multiple left neck surgical clips. Advanced cervical carotid calcified atherosclerosis. Disc levels: Partially calcified ligamentous hypertrophy about the odontoid. Disc and endplate degeneration maximal at C6-C7, but with possible developing interbody ankylosis there. Upper chest: Partially visible sternotomy. Grossly intact visible upper thoracic levels. Calcified aortic atherosclerosis. IMPRESSION: 1. No acute  traumatic injury identified in the cervical spine. 2. Postoperative changes to the neck, evidence of prior left neck dissection. 3. Calcified atherosclerosis, Aortic Atherosclerosis (ICD10-I70.0). Electronically Signed   By: Genevie Ann M.D.   On: 04/11/2022 09:48    Procedures .Critical Care  Performed by: Hayden Rasmussen, MD Authorized by: Hayden Rasmussen, MD   Critical care provider statement:    Critical care time (minutes):  45   Critical care time was exclusive of:  Separately billable procedures and treating other patients   Critical care was necessary to treat or prevent imminent or life-threatening deterioration of the following conditions:  Trauma   Critical care was time spent personally by me on the following activities:  Development of treatment plan with patient or surrogate, discussions with consultants, evaluation of patient's response to treatment, examination of patient, obtaining history from patient or surrogate, ordering and performing treatments and interventions, ordering and review of laboratory studies, ordering and review of radiographic studies, pulse oximetry, re-evaluation of patient's condition and review of old charts   I assumed direction of critical care for this patient from another provider in my specialty: no       Medications Ordered in ED Medications  fenofibrate tablet 54 mg (has no administration in time range)  metoprolol succinate (TOPROL-XL) 24 hr tablet 50 mg (has no administration in time range)  rosuvastatin (CRESTOR) tablet 20 mg (20 mg Oral Given 04/11/22 1839)  irbesartan (AVAPRO) tablet 150 mg (has no administration in time range)  citalopram (CELEXA) tablet 40 mg (has no administration in time range)  pantoprazole (PROTONIX) EC tablet 40 mg (has no administration in time range)  insulin aspart (novoLOG) injection 0-15 Units (3 Units Subcutaneous Given 04/11/22 1839)  insulin aspart (novoLOG) injection 0-5 Units (has no administration in time  range)  sodium chloride flush (NS) 0.9 % injection 3 mL (3 mLs Intravenous Given 04/11/22 1308)  lactated ringers infusion ( Intravenous New Bag/Given 04/11/22 1307)  acetaminophen (TYLENOL) tablet 650 mg (has no administration in time range)    Or  acetaminophen (TYLENOL) suppository  650 mg (has no administration in time range)  oxyCODONE (Oxy IR/ROXICODONE) immediate release tablet 5 mg (has no administration in time range)  morphine (PF) 2 MG/ML injection 2 mg (has no administration in time range)  docusate sodium (COLACE) capsule 100 mg (100 mg Oral Patient Refused/Not Given 04/11/22 1309)  polyethylene glycol (MIRALAX / GLYCOLAX) packet 17 g (has no administration in time range)  bisacodyl (DULCOLAX) EC tablet 5 mg (has no administration in time range)  ondansetron (ZOFRAN) tablet 4 mg (has no administration in time range)    Or  ondansetron (ZOFRAN) injection 4 mg (has no administration in time range)  hydrALAZINE (APRESOLINE) injection 5 mg (has no administration in time range)  mupirocin ointment (BACTROBAN) 2 % 1 Application (1 Application Nasal Given 04/11/22 1740)  bacitracin ointment (1 Application Topical Given 04/11/22 1009)  ondansetron (ZOFRAN) injection 4 mg (4 mg Intravenous Given 04/11/22 1005)    ED Course/ Medical Decision Making/ A&P Clinical Course as of 04/11/22 1903  Fri Apr 11, 2022  0948 Received a call from radiology that the patient has an acute subdural [MB]  1007 Discussed with Maudie Mercury from neurosurgery.  She is recommending medicine admission, no reversal of anticoagulation.  Will need CT in the morning and they will consult on patient. [MB]  1027 Discussed with Dr. Lorin Mercy from [facility] hospitalist service who will evaluate the patient for admission. [MB]    Clinical Course User Index [MB] Hayden Rasmussen, MD                           Medical Decision Making Amount and/or Complexity of Data Reviewed Labs: ordered. Radiology: ordered.  Risk OTC  drugs. Prescription drug management. Decision regarding hospitalization.  This patient complains of fall head injury; this involves an extensive number of treatment Options and is a complaint that carries with it a high risk of complications and morbidity. The differential includes skull fracture, intracranial bleed, arrhythmia, dehydration  I ordered, reviewed and interpreted labs, which included CBC normal, chemistries with low bicarb elevated BUN and creatinine, INR therapeutic at 2.7 I ordered medication bacitracin and Zofran and reviewed PMP when indicated. I ordered imaging studies which included CT head and cervical spine and I independently    visualized and interpreted imaging which showed subdural along the falx Additional history obtained from EMS Previous records obtained and reviewed in epic, no recent admissions I consulted neurosurgery PA Myron and [facility] hospitalist Dr. Lorin Mercy and discussed lab and imaging findings and discussed disposition.  Cardiac monitoring reviewed, normal sinus rhythm  social determinants considered, no significant barriers Critical Interventions: Trauma evaluation and discussed with neurosurgery regarding traumatic injuries and need for anticoagulation reversal  After the interventions stated above, I reevaluated the patient and found patient is awake and alert Admission and further testing considered, he will need admission to the hospital for serial neurologic exams and repeat imaging.  Patient in agreement with plan.          Final Clinical Impression(s) / ED Diagnoses Final diagnoses:  Subdural hematoma (Marissa)  Fall, initial encounter  Injury of head, initial encounter  Abrasion of head, initial encounter  Abrasion of left elbow, initial encounter  Abrasion of right elbow, initial encounter    Rx / DC Orders ED Discharge Orders     None         Hayden Rasmussen, MD 04/11/22 1907

## 2022-04-11 NOTE — ED Notes (Signed)
CT made aware patient is ready for scans; no available room at this time

## 2022-04-12 ENCOUNTER — Observation Stay (HOSPITAL_COMMUNITY): Payer: Worker's Compensation

## 2022-04-12 DIAGNOSIS — Z85820 Personal history of malignant melanoma of skin: Secondary | ICD-10-CM | POA: Diagnosis not present

## 2022-04-12 DIAGNOSIS — S065XAA Traumatic subdural hemorrhage with loss of consciousness status unknown, initial encounter: Secondary | ICD-10-CM | POA: Diagnosis not present

## 2022-04-12 DIAGNOSIS — W101XXA Fall (on)(from) sidewalk curb, initial encounter: Secondary | ICD-10-CM | POA: Diagnosis not present

## 2022-04-12 DIAGNOSIS — Z9889 Other specified postprocedural states: Secondary | ICD-10-CM | POA: Diagnosis not present

## 2022-04-12 DIAGNOSIS — E669 Obesity, unspecified: Secondary | ICD-10-CM

## 2022-04-12 DIAGNOSIS — E1169 Type 2 diabetes mellitus with other specified complication: Secondary | ICD-10-CM

## 2022-04-12 DIAGNOSIS — E7800 Pure hypercholesterolemia, unspecified: Secondary | ICD-10-CM

## 2022-04-12 DIAGNOSIS — S065X0A Traumatic subdural hemorrhage without loss of consciousness, initial encounter: Secondary | ICD-10-CM | POA: Diagnosis not present

## 2022-04-12 DIAGNOSIS — Z7901 Long term (current) use of anticoagulants: Secondary | ICD-10-CM | POA: Diagnosis not present

## 2022-04-12 DIAGNOSIS — G9389 Other specified disorders of brain: Secondary | ICD-10-CM | POA: Diagnosis not present

## 2022-04-12 DIAGNOSIS — Z8679 Personal history of other diseases of the circulatory system: Secondary | ICD-10-CM | POA: Diagnosis not present

## 2022-04-12 LAB — BASIC METABOLIC PANEL
Anion gap: 5 (ref 5–15)
BUN: 24 mg/dL — ABNORMAL HIGH (ref 8–23)
CO2: 24 mmol/L (ref 22–32)
Calcium: 9.3 mg/dL (ref 8.9–10.3)
Chloride: 108 mmol/L (ref 98–111)
Creatinine, Ser: 1.71 mg/dL — ABNORMAL HIGH (ref 0.61–1.24)
GFR, Estimated: 41 mL/min — ABNORMAL LOW (ref >60)
Glucose, Bld: 130 mg/dL — ABNORMAL HIGH (ref 70–99)
Potassium: 4.7 mmol/L (ref 3.5–5.1)
Sodium: 137 mmol/L (ref 135–145)

## 2022-04-12 LAB — CBC
HCT: 41.2 % (ref 39.0–52.0)
Hemoglobin: 14 g/dL (ref 13.0–17.0)
MCH: 30.6 pg (ref 26.0–34.0)
MCHC: 34 g/dL (ref 30.0–36.0)
MCV: 90.2 fL (ref 80.0–100.0)
Platelets: 153 10*3/uL (ref 150–400)
RBC: 4.57 MIL/uL (ref 4.22–5.81)
RDW: 14.2 % (ref 11.5–15.5)
WBC: 6.3 10*3/uL (ref 4.0–10.5)
nRBC: 0 % (ref 0.0–0.2)

## 2022-04-12 LAB — GLUCOSE, CAPILLARY
Glucose-Capillary: 135 mg/dL — ABNORMAL HIGH (ref 70–99)
Glucose-Capillary: 164 mg/dL — ABNORMAL HIGH (ref 70–99)

## 2022-04-12 MED ORDER — ONDANSETRON HCL 4 MG PO TABS: 4.0000 mg | ORAL_TABLET | Freq: Four times a day (QID) | ORAL | 0 refills | Status: DC | PRN

## 2022-04-12 MED ORDER — WARFARIN SODIUM 5 MG PO TABS: 2.5000 mg | ORAL_TABLET | ORAL | Status: AC

## 2022-04-12 MED ORDER — POLYETHYLENE GLYCOL 3350 17 G PO PACK: 17.0000 g | PACK | Freq: Every day | ORAL | 0 refills | Status: AC | PRN

## 2022-04-12 MED ORDER — ACETAMINOPHEN 325 MG PO TABS: 650.0000 mg | ORAL_TABLET | Freq: Four times a day (QID) | ORAL | 0 refills | Status: DC | PRN

## 2022-04-12 NOTE — Progress Notes (Signed)
NEUROSURGERY PROGRESS NOTE  Doing well. Complains of some mild headaches No numbness, tingling or weakness Ambulating and voiding well Good strength and sensation  Temp:  [97.4 F (36.3 C)-98.7 F (37.1 C)] 98.2 F (36.8 C) (07/15 0729) Pulse Rate:  [54-70] 60 (07/15 0729) Resp:  [14-22] 18 (07/15 0729) BP: (109-170)/(64-116) 157/76 (07/15 0729) SpO2:  [93 %-100 %] 96 % (07/15 0729) Weight:  [95.3 kg] 95.3 kg (07/14 0913)  Plan: Awaiting follow up CT this morning.   Eleonore Chiquito, NP 04/12/2022 8:18 AM

## 2022-04-12 NOTE — TOC Transition Note (Signed)
Transition of Care [facility]) - CM/SW Discharge Note   Patient Details  Name: Evan [name] MRN: [id number] Date of Birth: Jul 04, 1946  [facility] CM/SW Contact:  Bartholomew Crews, RN Phone Number: [phone] 04/12/2022, 1:21 PM   Clinical Narrative:     Spoke with patient on hospital room phone to discuss post acute transition. Patient agreeable to outpatient rehab - referral sent. Patient stated that his wife will provide transportation home. No further TOC needs identified at this time.   Final next level of care: OP Rehab Barriers to Discharge: No Barriers Identified   Patient Goals and CMS Choice        Discharge Placement                       Discharge Plan and Services                                     Social Determinants of Health (SDOH) Interventions     Readmission Risk Interventions     No data to display

## 2022-04-12 NOTE — Evaluation (Signed)
Occupational Therapy Evaluation Patient Details Name: Evan [name] MRN: [id number] DOB: 12-Jun-1946 Today's Date: 04/12/2022   History of Present Illness 76 y.o. male presents to [facility] hospital on 7/14 after falling. Pt found to have small parafalcine SDH and right tentorial SDH , reporting nausea, HA, and dizziness. PMH includes anxiety, aflutter, depression, HTN, PE, CABG, DMII.   Clinical Impression   PT admitted with SDH s/p fall. Pt currently with functional limitiations due to the deficits listed below (see OT problem list). Pt with dizziness with head turns and requires gaze stabilization to help during transfers. Pt with LOB during session and needs min to min guard (A). Pt noted to have widen base of support and decreased gaze velocity. Pt and wife provided post concussion handout.  Pt will benefit from skilled OT to increase their independence and safety with adls and balance to allow discharge outpatient.       Recommendations for follow up therapy are one component of a multi-disciplinary discharge planning process, led by the attending physician.  Recommendations may be updated based on patient status, additional functional criteria and insurance authorization.   Follow Up Recommendations  Outpatient OT    Assistance Recommended at Discharge Intermittent Supervision/Assistance  Patient can return home with the following A little help with walking and/or transfers;A little help with bathing/dressing/bathroom;Assistance with cooking/housework;Direct supervision/assist for medications management;Assist for transportation    Functional Status Assessment  Patient has had a recent decline in their functional status and demonstrates the ability to make significant improvements in function in a reasonable and predictable amount of time.  Equipment Recommendations  None recommended by OT    Recommendations for Other Services       Precautions / Restrictions  Precautions Precautions: Fall Restrictions Weight Bearing Restrictions: No      Mobility Bed Mobility Overal bed mobility: Needs Assistance Bed Mobility: Supine to Sit     Supine to sit: Min guard     General bed mobility comments: pt initially reaching for therapist to pull up. pt educated on log rolling to exit the bed on the L side and pushing into sitting. pt with dizziness upon sitting    Transfers Overall transfer level: Needs assistance Equipment used: None Transfers: Sit to/from Stand Sit to Stand: Supervision                  Balance Overall balance assessment: Needs assistance Sitting-balance support: No upper extremity supported, Feet supported Sitting balance-Leahy Scale: Good     Standing balance support: No upper extremity supported, During functional activity Standing balance-Leahy Scale: Good                             ADL either performed or assessed with clinical judgement   ADL Overall ADL's : Needs assistance/impaired Eating/Feeding: Set up;Sitting   Grooming: Min guard;Standing Grooming Details (indicate cue type and reason): noted to have LOB going from toilet to sink level               Lower Body Dressing Details (indicate cue type and reason): noted to have wet underwear and not wanting to doff them at this time. wife leaving hospital to go buy new ones at the end of session. Toilet Transfer: Minimal assistance;Regular Toilet;Rolling walker (2 wheels) Toilet Transfer Details (indicate cue type and reason): pt asking for RW to enter the bathroom. pt with LOB x2 Toileting- Clothing Manipulation and Hygiene: Min guard;Sitting/lateral lean  Functional mobility during ADLs: Minimal assistance General ADL Comments: pt wife present for all education. educated on post concusion with handout provided. educated on gaze stabilization. pt reports the room is spinning for ~10 seconds at a time     Vision Baseline  Vision/History: 1 Wears glasses Ability to See in Adequate Light: 0 Adequate       Perception     Praxis      Pertinent Vitals/Pain Pain Assessment Pain Assessment: No/denies pain     Hand Dominance Right   Extremity/Trunk Assessment Upper Extremity Assessment Upper Extremity Assessment: Overall WFL for tasks assessed   Lower Extremity Assessment Lower Extremity Assessment: Overall WFL for tasks assessed   Cervical / Trunk Assessment Cervical / Trunk Assessment: Normal   Communication Communication Communication: No difficulties   Cognition Arousal/Alertness: Awake/alert Behavior During Therapy: WFL for tasks assessed/performed Overall Cognitive Status: Within Functional Limits for tasks assessed                                       General Comments  VSS    Exercises Exercises: Other exercises Other Exercises Other Exercises: gaze stabilization with transfers Other Exercises: demonstration on sway with static standing so recommendaiton for seating showering   Shoulder Instructions      Home Living Family/patient expects to be discharged to:: Private residence Living Arrangements: Spouse/significant other Available Help at Discharge: Family;Available PRN/intermittently Type of Home: House Home Access: Stairs to enter;Ramped entrance Entrance Stairs-Number of Steps: 2 Entrance Stairs-Rails: Right Home Layout: One level     Bathroom Shower/Tub: Occupational psychologist: Standard     Home Equipment: Conservation officer, nature (2 wheels);Shower seat;Toilet riser;Adaptive equipment Adaptive Equipment: Reacher;Sock aid;Long-handled shoe horn Additional Comments: 2 dogs ( Charlie and Buddy) 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; 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      Prior Functioning/Environment Prior Level of Function : Independent/Modified Independent             Mobility Comments: 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          OT Problem List: Decreased  strength;Decreased activity tolerance;Impaired balance (sitting and/or standing);Decreased cognition;Decreased safety awareness;Decreased knowledge of use of DME or AE;Decreased knowledge of precautions      OT Treatment/Interventions: Self-care/ADL training;Therapeutic exercise;Neuromuscular education;DME and/or AE instruction;Manual therapy;Modalities;Therapeutic activities;Cognitive remediation/compensation;Patient/family education;Balance training    OT Goals(Current goals can be found in the care plan section) Acute Rehab OT Goals Patient Stated Goal: to be able to return home OT Goal Formulation: With patient/family Time For Goal Achievement: 04/26/22 Potential to Achieve Goals: Good  OT Frequency: Min 2X/week    Co-evaluation              AM-PAC OT "6 Clicks" Daily Activity     Outcome Measure Help from another person eating meals?: None Help from another person taking care of personal grooming?: None Help from another person toileting, which includes using toliet, bedpan, or urinal?: A Little Help from another person bathing (including washing, rinsing, drying)?: A Little Help from another person to put on and taking off regular upper body clothing?: A Little Help from another person to put on and taking off regular lower body clothing?: A Little 6 Click Score: 20   End of Session Equipment Utilized During Treatment: Gait belt;Rolling walker (2 wheels) Nurse Communication: Mobility status;Precautions  Activity Tolerance: Patient tolerated treatment well Patient left: in chair;with call bell/phone within reach;with  chair alarm set  OT Visit Diagnosis: Unsteadiness on feet (R26.81);Muscle weakness (generalized) (M62.81)                Time: 4734-0370 OT Time Calculation (min): 36 min Charges:  OT General Charges $OT Visit: 1 Visit OT Evaluation $OT Eval Moderate Complexity: 1 Mod OT Treatments $Self Care/Home Management : 8-22 mins  Brynn, OTR/L  Acute  Rehabilitation Services Office: [phone] .   Jeri Modena 04/12/2022, 11:16 AM

## 2022-04-12 NOTE — Progress Notes (Signed)
He looks good this morning.  He complains of some headache that is intermittent.  No nausea and vomiting.  No dizziness.  No numbness tingling or weakness.  He is awake and alert and interactive.  Awaiting follow-up head CT

## 2022-04-12 NOTE — Progress Notes (Signed)
Physical Therapy Treatment & Discharge  Patient Details Name: Evan [name] MRN: [id number] DOB: [birth year] Today's Date: 04/12/2022   History of Present Illness 76 y.o. male presents to [facility] hospital on 7/14 after falling. Pt found to have small parafalcine SDH and right tentorial SDH , reporting nausea, HA, and dizziness. PMH includes anxiety, aflutter, depression, HTN, PE, CABG, DMII.    PT Comments    Pt demonstrating good carryover of gaze stabilization techniques during mobility. No LOB noted and no complaints of increased dizziness with ambulation. Performed all mobility with supervision including ambulation of 468f. Pt encouraged to continue to mobilizing cautiously upon discharge and to continue gaze stabilization techniques. PT will sign off due to pts level of independence, please reconsult if pt has decline in function.     Recommendations for follow up therapy are one component of a multi-disciplinary discharge planning process, led by the attending physician.  Recommendations may be updated based on patient status, additional functional criteria and insurance authorization.  Follow Up Recommendations  Outpatient PT (neuro)     Assistance Recommended at Discharge PRN  Patient can return home with the following A little help with bathing/dressing/bathroom;A little help with walking and/or transfers;Help with stairs or ramp for entrance;Assist for transportation;Assistance with cooking/housework   Equipment Recommendations  None recommended by PT    Recommendations for Other Services       Precautions / Restrictions Precautions Precautions: Fall Restrictions Weight Bearing Restrictions: No     Mobility  Bed Mobility               General bed mobility comments: pt in chair upon arrival    Transfers Overall transfer level: Needs assistance Equipment used: None Transfers: Sit to/from Stand Sit to Stand: Supervision           General transfer  comment: cues for gaze stabilization, supervision for safety    Ambulation/Gait Ambulation/Gait assistance: Supervision Gait Distance (Feet): 400 Feet Assistive device: None Gait Pattern/deviations: Step-through pattern, Decreased stride length Gait velocity: functional Gait velocity interpretation: 1.31 - 2.62 ft/sec, indicative of limited community ambulator   General Gait Details: cues for gaze stabilization during turns throughout. Able to increase gait speed when cued. Decreased ability to dual task, unable to state months backwards and decrease in gait speed during task   Stairs             Wheelchair Mobility    Modified Rankin (Stroke Patients Only)       Balance Overall balance assessment: Needs assistance Sitting-balance support: No upper extremity supported, Feet supported Sitting balance-Leahy Scale: Good     Standing balance support: No upper extremity supported, During functional activity Standing balance-Leahy Scale: Good Standing balance comment: able to stand approximately 5 min for wife to wash lower body                            Cognition Arousal/Alertness: Awake/alert Behavior During Therapy: WFL for tasks assessed/performed Overall Cognitive Status: Within Functional Limits for tasks assessed                                 General Comments: decreased ability to dual task        Exercises      General Comments General comments (skin integrity, edema, etc.): VSS      Pertinent Vitals/Pain Pain Assessment Pain Assessment: No/denies pain  Home Living Family/patient expects to be discharged to:: Private residence Living Arrangements: Spouse/significant other Available Help at Discharge: Family;Available PRN/intermittently Type of Home: House Home Access: Stairs to enter;Ramped entrance Entrance Stairs-Rails: Right Entrance Stairs-Number of Steps: 2   Home Layout: One level Home Equipment: Chartered certified accountant (2 wheels);Shower seat;Toilet riser;Adaptive equipment Additional Comments: 2 dogs Counsellor and 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; 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    Prior Function            PT Goals (current goals can now be found in the care plan section) Acute Rehab PT Goals Patient Stated Goal: to go home PT Goal Formulation: All assessment and education complete, DC therapy Progress towards PT goals: Goals met/education completed, patient discharged from PT    Frequency    Min 3X/week      PT Plan Current plan remains appropriate    Co-evaluation              AM-PAC PT "6 Clicks" Mobility   Outcome Measure  Help needed turning from your back to your side while in a flat bed without using bedrails?: None Help needed moving from lying on your back to sitting on the side of a flat bed without using bedrails?: None Help needed moving to and from a bed to a chair (including a wheelchair)?: A Little Help needed standing up from a chair using your arms (e.g., wheelchair or bedside chair)?: A Little Help needed to walk in hospital room?: A Little Help needed climbing 3-5 steps with a railing? : A Little 6 Click Score: 20    End of Session   Activity Tolerance: Patient tolerated treatment well Patient left: in chair;with call bell/phone within reach;with chair alarm set;with family/visitor present Nurse Communication: Mobility status PT Visit Diagnosis: Other abnormalities of gait and mobility (R26.89);Dizziness and giddiness (R42)     Time: 7034-0352 PT Time Calculation (min) (ACUTE ONLY): 18 min  Charges:  $Therapeutic Activity: 8-22 mins                    Mackie Pai, SPT Acute Rehabilitation Services  Office: [phone]    Mackie Pai 04/12/2022, 1:45 PM

## 2022-04-12 NOTE — Care Management Obs Status (Signed)
[place] NOTIFICATION   Patient Details  Name: EZERIAH LUTY MRN: [id number] Date of Birth: April 26, 1946   Medicare Observation Status Notification Given:  Yes    Bartholomew Crews, RN 04/12/2022, 1:21 PM

## 2022-04-12 NOTE — Progress Notes (Signed)
CT head unchanged with trace SDH, safe for D/C home from our standpoint

## 2022-04-12 NOTE — Discharge Summary (Signed)
Physician Discharge Summary   Patient: Evan [name] MRN: [id number] DOB: 1945-12-24  Admit date:     04/11/2022  Discharge date: 04/12/22  Discharge Physician: Raiford Noble, DO    PCP: Josetta Huddle, MD   Recommendations at discharge:   Follow-up with PCP within 1 to 2 weeks and repeat CBC, CMP, mag, Phos within 1 week Follow-up with Neurosurgery Dr. Ronnald Ramp within 1-2 weeks   Discharge Diagnoses: Principal Problem:   SDH (subdural hematoma) (HCC) Active Problems:   Coronary artery disease   Pure hypercholesterolemia   Essential hypertension, benign   Typical atrial flutter ([place])   Fall (on)(from) sidewalk curb, initial encounter   History of melanoma excision   Diabetes mellitus type 2 in obese ([place])   DNR (do not resuscitate)  Resolved Problems:   * No resolved hospital problems. [facility] Course: HPI per Dr. Karmen Bongo on 04/11/22 Evan [name] is a 76 y.o. male with medical history significant of CAD s/p CABG; HTN; HLD; melanoma; afib on Coumadin; PE; DM; and OSA not on CPAP presenting with a fall.  He mis-stepped around a traffic cone and fell backwards, landing on his head.  He has some balance issues but generally can catch himself.  He feels dizzy, mildly nauseated periodically, has a continuous headaches on the B frontal regions (sides, not center).  He was feeling fine this AM, wasn't wanting to go to work.     ER Course:  Afib on Coumadin.  Works at [employer], tripped and fell, hit head, no LOC.  Awake and alert, mild nausea.  Head CT with small SDH.  Neurosurgery does not recommend [facility] reversal, will consult.  Likely repeat CT in AM.  **Interim History  Neurosurgery recommended holding the Coumadin for 3 days and resuming on 04/15/2022.  Repeat head CT showed that is unchanged with trace subdural hemorrhage so neurosurgery felt that they were safe to discharge home with outpatient follow-up.  Patient is medically stable and his headache is improved.  PT  OT recommending outpatient follow-up.  Assessment and Plan:  Fall, SDH -Patient with apparent mechanical fall reported while patient was at work -CT with small parafalcine and R tentorial SDH without mass effect -neurosurgery has seen him and he does not appear to need surgical intervention at this time -For now, will observe in neuro-progressive unit -Repeat CT done and was relatively unchanged so he is stable from a neuro surgical perspective for discharge -Per neurosurgery, he does not need reversal of Coumadin at this time and it will likely need to be held for at least 3 days -PT OT recommending outpatient follow-up   Afib, on AC -Continue Toprol XL for rate control -Hold Coumadin for now given brain bleed for 3 days   CAD -s/p CABG -No c/o CP at this time -He is not currently on ASA given that he is on Coumadin and will resume Coumadin in 3 days   HTN -Continue metoprolol, valsartan -To monitor blood pressures per protocol   HLD -Continue fenofibrate, Crestor -Hold Lovaza   Melanoma -s/p L facial and neck dissection -No known active surgery   DM -A1c is 6.2, indicating good control -hold glyburide, metformin, pioglitazone -Cover with moderate-scale SSI    OSA -Not on CPAP   DNR -Dr. Lorin Mercy discussed code status with the patient and his son and  they are in agreement that the patient would not desire resuscitation and would prefer to die a natural death should that situation arise. -He will  need a gold out of facility DNR form at the time of discharge this can be done in outpatient setting  Obesity -Complicates overall prognosis and care -Estimated body mass index is 30.13 kg/m as calculated from the following:   Height as of this encounter: '5\' 10"'$  (1.778 m).   Weight as of this encounter: 95.3 kg.  -Weight Loss and Dietary Counseling given    Consultants: Neurosurgery Procedures performed: Repeat head CT found to Disposition: Home Diet recommendation:   Discharge Diet Orders (From admission, onward)     Start     Ordered   04/12/22 0000  Diet - low sodium heart healthy        04/12/22 1236   04/12/22 0000  Diet Carb Modified        04/12/22 1236           Cardiac and Carb modified diet DISCHARGE MEDICATION: Allergies as of 04/12/2022       Reactions   Doxycycline Other (See Comments)   BPH can't void when taking    Penicillins Rash, Itching        Medication List     TAKE these medications    acetaminophen 325 MG tablet Commonly known as: TYLENOL Take 2 tablets (650 mg total) by mouth every 6 (six) hours as needed for mild pain (or Fever >/= 101).   Anucort-HC 25 MG suppository Generic drug: hydrocortisone Place 25 mg rectally daily as needed for hemorrhoids or anal itching.   citalopram 40 MG tablet Commonly known as: CELEXA Take 40 mg by mouth every morning.   fenofibrate micronized 67 MG capsule Commonly known as: LOFIBRA Take 1 capsule (67 mg total) by mouth daily.   fluticasone 0.05 % cream Commonly known as: CUTIVATE Apply 1 Application topically 2 (two) times daily as needed (rash).   glyBURIDE 5 MG tablet Commonly known as: DIABETA Take 5 mg by mouth every evening.   metFORMIN 500 MG 24 hr tablet Commonly known as: GLUCOPHAGE-XR Take 1,000 mg by mouth 2 (two) times daily.   metoprolol succinate 50 MG 24 hr tablet Commonly known as: TOPROL-XL Take 1 tablet (50 mg total) by mouth daily. With or immediately following a meal   nitroGLYCERIN 0.4 MG SL tablet Commonly known as: NITROSTAT Place 0.4 mg under the tongue every 5 (five) minutes as needed for chest pain.   omega-3 acid ethyl esters 1 g capsule Commonly known as: LOVAZA TAKE 2 CAPSULES TWICE A DAY   ondansetron 4 MG tablet Commonly known as: ZOFRAN Take 1 tablet (4 mg total) by mouth every 6 (six) hours as needed for nausea.   pantoprazole 40 MG tablet Commonly known as: PROTONIX Take 40 mg by mouth daily.   pioglitazone 30  MG tablet Commonly known as: ACTOS Take 30 mg by mouth every morning.   polyethylene glycol 17 g packet Commonly known as: MIRALAX / GLYCOLAX Take 17 g by mouth daily as needed for mild constipation.   rosuvastatin 20 MG tablet Commonly known as: CRESTOR Take 20 mg by mouth every evening.   valsartan 160 MG tablet Commonly known as: DIOVAN Take 80 mg by mouth every morning.   warfarin 5 MG tablet Commonly known as: COUMADIN Take 0.5-1 tablets (2.5-5 mg total) by mouth See admin instructions. As directed--1/2 tab ( 2.5 mg)  every Monday,Tuesday, and Wednesday; 1 tab  ( 5 mg)  all other days Start taking on: April 15, 2022 What changed: These instructions start on April 15, 2022. If you are  unsure what to do until then, ask your doctor or other care provider.        Follow-up Information     Josetta Huddle, MD Follow up.   Specialty: Internal Medicine Contact information: [address] [place] [address] [phone]         Jerline Pain, MD .   Specialty: Cardiology Contact information: [phone] [address] [place] [address] [phone]         Josetta Huddle, MD Follow up.   Specialty: Internal Medicine Why: Follow up within 1-2 weeks Contact information: [address] [place] [address] [phone]         Jerline Pain, MD .   Specialty: Cardiology Contact information: [phone] [address] [place] [address] [phone]         Eustace Moore, MD. Call.   Specialty: Neurosurgery Why: Follow up within 1-2 weeks Contact information: [address] [place]  [address] [phone]                Discharge Exam: Danley Danker Weights   04/11/22 0913  Weight: 95.3 kg   Vitals:   04/12/22 0729 04/12/22 1132  BP: (!) 157/76 136/79  Pulse: 60 (!) 57  Resp: 18 15  Temp: 98.2 F (36.8 C) 97.6 F (36.4 C)  SpO2: 96% 96%   Examination: Physical  Exam:  Constitutional: WN/WD obese Caucasian male currently no acute distress appears calm Neck: Appears normal, supple, no cervical masses, normal ROM, no appreciable thyromegaly Respiratory: Diminished to auscultation bilaterally, no wheezing, rales, rhonchi or crackles. Normal respiratory effort and patient is not tachypenic. No accessory muscle use.  Cardiovascular: RRR, no murmurs / rubs / gallops. S1 and S2 auscultated. No extremity edema.  Abdomen: Soft, non-tender, distended secondary body habitus. Bowel sounds positive.  GU: Deferred. Musculoskeletal: No clubbing / cyanosis of digits/nails. No joint deformity upper and lower extremities.  Skin: No rashes, lesions, ulcers. No induration; Warm and dry.  Neurologic: CN 2-12 grossly intact with no focal deficits. Romberg sign cerebellar reflexes not assessed.  Psychiatric: Normal judgment and insight. Alert and oriented x 3. Normal mood and appropriate affect.   Condition at discharge: stable  The results of significant diagnostics from this hospitalization (including imaging, microbiology, ancillary and laboratory) are listed below for reference.   Imaging Studies: CT HEAD WO CONTRAST (5MM)  Result Date: 04/12/2022 CLINICAL DATA:  76 year old male with small volume para falcine and tentorial subdural hematoma after a fall. EXAM: CT HEAD WITHOUT CONTRAST TECHNIQUE: Contiguous axial images were obtained from the base of the skull through the vertex without intravenous contrast. RADIATION DOSE REDUCTION: This exam was performed according to the departmental dose-optimization program which includes automated exposure control, adjustment of the mA and/or kV according to patient size and/or use of iterative reconstruction technique. COMPARISON:  Head CT 04/11/2022. FINDINGS: Brain: Trace para falcine subdural blood posteriorly appears stable to slightly decreased from yesterday. Similarly, trace right tentorial blood is less conspicuous. No new  intracranial hemorrhage identified. No midline shift, mass effect, or evidence of intracranial mass lesion. No ventriculomegaly. Stable gray-white matter differentiation throughout the brain. Small dystrophic calcifications in the left parietal and occipital lobes are unchanged and might be atherosclerotic or postinflammatory. Vascular: Calcified atherosclerosis at the skull base. Skull: Stable.  No acute osseous abnormality identified. Sinuses/Orbits: Stable maxillary sinus mucosal thickening and/or trace fluid. Other sinuses, tympanic cavities and mastoids remain well aerated. Other:  Posterior scalp soft tissue injury. Underlying calvarium intact. IMPRESSION: 1. No progression of trace para falcine and right tentorial subdural blood since yesterday. 2. No new intracranial abnormality. Electronically Signed   By: Genevie Ann M.D.   On: 04/12/2022 09:39   CT HEAD WO CONTRAST  Addendum Date: 04/11/2022   ADDENDUM REPORT: 04/11/2022 10:00 ADDENDUM: Study discussed by telephone with Dr. Aletta Edouard on 04/11/2022 at 0947 hours. Electronically Signed   By: Genevie Ann M.D.   On: 04/11/2022 10:00   Result Date: 04/11/2022 CLINICAL DATA:  76 year old male status post fall. EXAM: CT HEAD WITHOUT CONTRAST TECHNIQUE: Contiguous axial images were obtained from the base of the skull through the vertex without intravenous contrast. RADIATION DOSE REDUCTION: This exam was performed according to the departmental dose-optimization program which includes automated exposure control, adjustment of the mA and/or kV according to patient size and/or use of iterative reconstruction technique. COMPARISON:  Brain MRI 09/26/2008. FINDINGS: Brain: Small volume para falcine subdural blood (series 3, image 24), up to 3-4 mm thickness. Similar small volume of subdural blood layering on the right tentorium (coronal image 47). No mass effect. Some underlying generalized cerebral volume loss. No definite intraventricular hemorrhage or other acute  intracranial hemorrhage identified. No ventriculomegaly. No midline shift, mass effect, or evidence of intracranial mass lesion. No cortically based acute infarct identified. Gray-white matter differentiation is within normal limits. Vascular: Advanced calcified atherosclerosis. No suspicious intracranial vascular hyperdensity. Skull: No fracture identified. Sinuses/Orbits: Evidence of a calcified chronic left maxillary sinus cyst (series 4, image 1) with mild new bilateral maxillary sinus fluid or mucosal thickening when compared to 2009. Other Visualized paranasal sinuses and mastoids are stable and well aerated. Tympanic cavities are clear. Other: Posterior midline scalp hematoma or contusion on series 4, image 40. Underlying calvarium appears intact. No other orbit or scalp soft tissue injury identified. IMPRESSION: 1. Small volume parafalcine and right tentorial subdural hemorrhage. No mass effect. 2. No other acute traumatic injury to the brain identified. 3. Posterior midline scalp hematoma or contusion without underlying skull fracture. Electronically Signed: By: Genevie Ann M.D. On: 04/11/2022 09:45   CT CERVICAL SPINE WO CONTRAST  Result Date: 04/11/2022 CLINICAL DATA:  76 year old male status post fall. EXAM: CT CERVICAL SPINE WITHOUT CONTRAST TECHNIQUE: Multidetector CT imaging of the cervical spine was performed without intravenous contrast. Multiplanar CT image reconstructions were also generated. RADIATION DOSE REDUCTION: This exam was performed according to the departmental dose-optimization program which includes automated exposure control, adjustment of the mA and/or kV according to patient size and/or use of iterative reconstruction technique. COMPARISON:  Head CT today.  Brain MRI 09/26/2008. FINDINGS: Alignment: Straightening and mild reversal of cervical lordosis. Cervicothoracic junction alignment is within normal limits. Bilateral posterior element alignment is within normal limits. Skull  base and vertebrae: Visualized skull base is intact. No atlanto-occipital dissociation. C1 and C2 appear intact and aligned. No acute osseous abnormality identified. Soft tissues and spinal canal: No prevertebral fluid or swelling. No visible canal hematoma. Evidence of bilateral parotidectomy, left neck dissection with multiple left neck surgical clips. Advanced cervical carotid calcified atherosclerosis. Disc levels: Partially calcified ligamentous hypertrophy about the odontoid. Disc and endplate degeneration maximal at C6-C7, but with possible developing interbody ankylosis there. Upper chest: Partially visible sternotomy. Grossly intact visible upper thoracic levels. Calcified aortic atherosclerosis. IMPRESSION: 1. No acute traumatic injury identified in the cervical spine. 2. Postoperative changes to the neck, evidence of prior left neck dissection. 3. Calcified atherosclerosis, Aortic Atherosclerosis (ICD10-I70.0). Electronically Signed  By: Genevie Ann M.D.   On: 04/11/2022 09:48    Microbiology: Results for orders placed or performed during the hospital encounter of 04/11/22  Surgical PCR screen     Status: Abnormal   Collection Time: 04/11/22  2:42 PM   Specimen: Nasal Mucosa; Nasal Swab  Result Value Ref Range Status   MRSA, PCR NEGATIVE NEGATIVE Final   Staphylococcus aureus POSITIVE (A) NEGATIVE Final    Comment: (NOTE) The Xpert SA Assay (FDA approved for NASAL specimens in patients 38 years of age and older), is one component of a comprehensive surveillance program. It is not intended to diagnose infection nor to guide or monitor treatment. Performed at [facility], [place] [address] [place], [place] [address]     Labs: CBC: Recent Labs  Lab 04/11/22 0910 04/12/22 0237  WBC 7.6 6.3  HGB 15.0 14.0  HCT 44.6 41.2  MCV 91.4 90.2  PLT 200 754   Basic Metabolic Panel: Recent Labs  Lab 04/11/22 0910 04/12/22 0237  NA 138 137  K 4.9 4.7  CL 107 108  CO2 20* 24   GLUCOSE 163* 130*  BUN 29* 24*  CREATININE 1.71* 1.71*  CALCIUM 9.3 9.3   Liver Function Tests: Recent Labs  Lab 04/11/22 0910  AST 28  ALT 26  ALKPHOS 43  BILITOT 0.5  PROT 6.7  ALBUMIN 3.8   CBG: Recent Labs  Lab 04/11/22 1303 04/11/22 1722 04/11/22 2108 04/12/22 0731 04/12/22 1152  GLUCAP 196* 152* 114* 135* 164*    Discharge time spent: greater than 30 minutes.  Signed: Raiford Noble, DO [facility] 04/12/2022

## 2022-04-14 ENCOUNTER — Other Ambulatory Visit: Payer: Self-pay | Admitting: Cardiology

## 2022-04-23 ENCOUNTER — Other Ambulatory Visit: Payer: Self-pay | Admitting: Gastroenterology

## 2022-04-23 DIAGNOSIS — Z86010 Personal history of colonic polyps: Secondary | ICD-10-CM

## 2022-05-01 DIAGNOSIS — R269 Unspecified abnormalities of gait and mobility: Secondary | ICD-10-CM | POA: Diagnosis not present

## 2022-05-01 DIAGNOSIS — I6200 Nontraumatic subdural hemorrhage, unspecified: Secondary | ICD-10-CM | POA: Diagnosis not present

## 2022-05-01 DIAGNOSIS — R413 Other amnesia: Secondary | ICD-10-CM | POA: Diagnosis not present

## 2022-05-01 DIAGNOSIS — E291 Testicular hypofunction: Secondary | ICD-10-CM | POA: Diagnosis not present

## 2022-05-01 DIAGNOSIS — H9190 Unspecified hearing loss, unspecified ear: Secondary | ICD-10-CM | POA: Diagnosis not present

## 2022-05-05 ENCOUNTER — Encounter: Payer: Self-pay | Admitting: Occupational Therapy

## 2022-05-05 ENCOUNTER — Ambulatory Visit: Payer: Worker's Compensation | Attending: Internal Medicine | Admitting: Physical Therapy

## 2022-05-05 ENCOUNTER — Ambulatory Visit: Payer: Worker's Compensation | Admitting: Occupational Therapy

## 2022-05-05 DIAGNOSIS — S065XAA Traumatic subdural hemorrhage with loss of consciousness status unknown, initial encounter: Secondary | ICD-10-CM | POA: Insufficient documentation

## 2022-05-05 DIAGNOSIS — Z9181 History of falling: Secondary | ICD-10-CM

## 2022-05-05 DIAGNOSIS — E291 Testicular hypofunction: Secondary | ICD-10-CM | POA: Diagnosis not present

## 2022-05-05 DIAGNOSIS — R41842 Visuospatial deficit: Secondary | ICD-10-CM | POA: Diagnosis not present

## 2022-05-05 DIAGNOSIS — R2681 Unsteadiness on feet: Secondary | ICD-10-CM

## 2022-05-05 DIAGNOSIS — W19XXXA Unspecified fall, initial encounter: Secondary | ICD-10-CM | POA: Insufficient documentation

## 2022-05-05 DIAGNOSIS — I69218 Other symptoms and signs involving cognitive functions following other nontraumatic intracranial hemorrhage: Secondary | ICD-10-CM

## 2022-05-05 DIAGNOSIS — H8113 Benign paroxysmal vertigo, bilateral: Secondary | ICD-10-CM | POA: Diagnosis not present

## 2022-05-05 DIAGNOSIS — X58XXXA Exposure to other specified factors, initial encounter: Secondary | ICD-10-CM | POA: Diagnosis not present

## 2022-05-05 NOTE — Addendum Note (Signed)
Addended by: Vianne Bulls D on: 05/05/2022 02:26 PM   Modules accepted: Orders

## 2022-05-05 NOTE — Therapy (Addendum)
OUTPATIENT OCCUPATIONAL THERAPY NEURO EVALUATION  Patient Name: Evan [name] MRN: [id number] DOB:Jun 19, 1946, 76 y.o., male Today's Date: 05/05/2022  PCP: Josetta Huddle, MD REFERRING PROVIDER: Kerney Elbe, DO    OT End of Session - 05/05/22 1224     Visit Number 1    Number of Visits 9    Date for OT Re-Evaluation 08/03/22    OT Start Time 1104    OT Stop Time 1148    OT Time Calculation (min) 44 min    Activity Tolerance Patient tolerated treatment well    Behavior During Therapy [facility] for tasks assessed/performed             Past Medical History:  Diagnosis Date   Acquired dilation of ascending aorta and aortic root ([place])    a.) measurements by TTE in 04/2021 --> root 40 mm and ascending aorta 43 mm   Anginal pain ([place])    Anxiety    Atrial flutter ([place]) 04/22/2021   a.) CHADS2VASc = 5 (age x 2, HTN, T2DM, CAD); b.) daily warfarin   Carotid disease, bilateral ([place])    a.) Doppler in 2019 --> 1-39% stenosis on RIGHT and 40-58% on LEFT   Cataracts, bilateral    Chronic anticoagulation    Warfarin   Chronic renal insufficiency    Coronary artery disease    a.) s/p 3v CABG in 1999   Depression    GERD (gastroesophageal reflux disease)    History of kidney stones    Hypercholesteremia    Hypertension    Kidney stone    Low testosterone    on TRT injections   Malignant melanoma ([place])    a.) face, nose, left leg; b.) Tx'd with chemotherapy + XRT at [facility]   Myocardial infarction [facility])    Pulmonary emboli ([place])    S/P CABG x 3 1999   a.) LIMA-LAD, SVG-RCA, SVG-diagnonal   Sleep apnea    not using nocturnal PAP therapy   T2DM (type 2 diabetes mellitus) ([place])    Past Surgical History:  Procedure Laterality Date   CARDIAC CATHETERIZATION     x3     last 1999   CARDIOVERSION N/A 08/12/2021   Procedure: CARDIOVERSION;  Surgeon: Fay Records, MD;  Location: [place];  Service: Cardiovascular;  Laterality: N/A;   CATARACT EXTRACTION Bilateral     COLONOSCOPY WITH PROPOFOL N/A 09/26/2014   Procedure: COLONOSCOPY WITH PROPOFOL;  Surgeon: [name], MD;  [facility];  Service: Endoscopy;  Laterality: N/A;   CORONARY ARTERY BYPASS GRAFT N/A 1999   3v; LIMA-LAD, SVG-RCA, SVG-diagonal   HOLEP-LASER ENUCLEATION OF THE PROSTATE WITH MORCELLATION N/A 06/07/2021   Procedure: HOLEP-LASER ENUCLEATION OF THE PROSTATE WITH MORCELLATION;  Surgeon: Billey Co, MD;  Location: [facility];  Service: Urology;  Laterality: N/A;   KNEE ARTHROSCOPY Left    LAPAROSCOPIC CHOLECYSTECTOMY     MASS EXCISION  04/15/2012   Procedure: EXCISION MASS;  Surgeon: [name], MD;  Location: [place];  Service: ENT;  Laterality: Left;  Left tonsil biopsy   melanomia     several skin ca removed-nose, left ankle, parotid gland left, right nodes-being followed annually..   Patient Active Problem List   Diagnosis Date Noted   SDH (subdural hematoma) ([place]) 04/11/2022   Fall (on)(from) sidewalk curb, initial encounter 04/11/2022   History of melanoma excision 04/11/2022   Diabetes mellitus type 2 in obese ([place]) 04/11/2022   DNR (do not resuscitate) 04/11/2022   Chronic anticoagulation 06/30/2021  Typical atrial flutter ([place]) 06/28/2021   Pure hypercholesterolemia 12/23/2013   Carotid artery disease ([place]) 12/23/2013   Essential hypertension, benign 12/23/2013   Angina decubitus ([place]) 12/23/2013   Coronary artery disease    Cancer ([place])    Depression    Pulmonary emboli (HCC)    Hypercholesteremia    Kidney stone    Chronic renal insufficiency     ONSET DATE:  04/11/22  REFERRING DIAG: O27.5XAA (ICD-10-CM) - Subdural hematoma (HCC) W19.Merril Abbe (ICD-10-CM) - Fall, initial encounter   THERAPY DIAG:  Visuospatial deficit  Unsteadiness on feet  Other symptoms and signs involving cognitive functions following other nontraumatic intracranial hemorrhage  Rationale for Evaluation and Treatment Rehabilitation  SUBJECTIVE:   SUBJECTIVE  STATEMENT: Fell stepping on traffic cone at work.  Doctor said if I felt like I could drive, I could.  I drove today.  Pt reports that he has had some dizziness/"spinning" when getting up out of bed or recliner, but did not happen for first time today.  Pt accompanied by: self  PERTINENT HISTORY:  76 y.o. male presents to [facility] hospital on 7/14 after falling. Pt found to have small parafalcine SDH and right tentorial SDH , reporting nausea, HA, and dizziness   PMH:  includes anxiety, aflutter, depression, HTN, PE, CABG, DMII.   PRECAUTIONS: Fall  PAIN:  Are you having pain? No  FALLS: Has patient fallen in last 6 months? Yes. Number of falls 1 with accident, "probably" others, falls 1-2x/yr  LIVING ENVIRONMENT: Lives with: lives with their family and lives with their spouse  PLOF: Independent, Vocation/Vocational requirements: driving/moving cars at [place] (generally around 28hrs/wk), retired from Teacher, music, and Leisure: play with dogs, sports (used to golf, bowl, no longer does yardwork)  [place] like to get back to doing everything that I was doing prior and do what I'd like (within reason).  OBJECTIVE:   HAND DOMINANCE: Right  ADLs:  Transfers/ambulation related to ADLs:  dizziness with getting up from recliner or bed, using cane to help with sit>stand Eating: mod I Grooming: mod I UB Dressing: mod I LB Dressing: mod I (sitting to get pants/underwear started on feet) Toileting: mod I, using cane Bathing: mod I  Tub Shower transfers: mod I with difficulty Equipment: Shower seat with back and Grab bars (can't hold onto grab bar currently due to seat).  IADLs: Light housekeeping: shared with wife, has Manufacturing engineer, does laundry Meal Prep: mod I Community mobility: independent prior, drove today for first time with no reported difficulty  MOBILITY STATUS: Independent, hx of falls, now using "pronged cane" outside and get up (did not use cane  prior to SDH).  POSTURE COMMENTS:  rounded shoulders  UPPER EXTREMITY ROM   BUEs grossly WNL   UPPER EXTREMITY MMT:   UEs not formally tested  HAND FUNCTION: Appears WNL  COORDINATION: Pt denies changes/deficits  SENSATION: WFL per pt report, denies changes  COGNITION: Overall cognitive status: Impaired and reports mild memory (but prior to fall)--misplaces things.  Pt reports that wife would say he is having more trouble since fall, but pt noted that he is also is not wearing hearing aides due to them not working well.  VISION: Baseline vision: Wears glasses for reading only Visual history: cataracts and hx of surgery  VISION ASSESSMENT: Impaired Ocular ROM: WFL Tracking/Visual pursuits: Decreased smoothness with horizontal tracking and to the L, particularly with R eye, with L eye--mild decr smoothness with tracking superiorly Saccades: additional eye shifts occurred during  testing and to occurred when looking to the L Convergence: WFL Visual Fields: pt appeared to demo decr awareness to L visual field when stimulus on both sides simultaneously Environmental scanning with ambulating in min distracting environment with 10/15 items located on first pass ( 67%) and additional 3 located on 2nd pass, cueing needed for remaining 2 items.  3/5 items missed initially were located on L side.   OBSERVATIONS: pt with decr head movements noted with ambulation.   TODAY'S TREATMENT:  Eval completed.  Also See pt education   PATIENT EDUCATION: Education details: OT eval results and POC; Recommended against returning to work at this time and discussed concerns with driving due to decr visual scanning noted.  Also recommended that wife ride with pt when driving to check performance. Person educated: Patient Education method: Explanation Education comprehension: verbalized understanding   HOME EXERCISE PROGRAM: Not yet issued   GOALS: Potential Goals reviewed with patient?  Yes  SHORT TERM GOALS: Target date: 06/04/22  Pt will verbalize understanding of visual compensation strategies. Goal status: INITIAL  2.  Pt will verbalize understanding of HEP for visual and cognitive deficits. Goal status: INITIAL  3.  Pt will perform environmental scanning/navigation in min distracting environment with at least 90% accuracy for incr safety. Baseline:  67% Goal status: INITIAL    LONG TERM GOALS: Target date: 08/03/22  Pt will perform environmental scanning/navigation in mod distracting environment with at least 90% accuracy for incr safety. Goal status: INITIAL  2.  Pt will perform simple environmental scanning/navigation with at least 1 other cognitive/physical task with at least 85% accuracy. Goal status: INITIAL  3.  Pt will verbalize understanding of memory/cognitive compensation strategies for ADLs/IADLs. Goal status: INITIAL   ASSESSMENT:  CLINICAL IMPRESSION: Patient is a 76 y.o. male who was seen today for occupational therapy evaluation for small parafalcine SDH and right tentorial SDH s/p fall that occurred 04/11/22 while at work (stepped on cone).  Pt with PMH that also includes:  CAD, hypercholesterolemia, depression,  atrial flutter, depression, HTN, PE, CABG, DMII, CA (hx of melanoma excision), L knee arthroscopy, cholecystectomy.  Pt was independent prior to fall; although pt with hx of previous falls as well.  Pt currently not working.  Pt presents today with decr balance, decr visual-perceptual skills, mild cognitive deficits.  PERFORMANCE DEFICITS in functional skills including IADLs, mobility, balance, decreased knowledge of precautions, vision, and vestibular, cognitive skills including attention, memory, and safety awareness, and psychosocial skills including environmental adaptation and habits.   IMPAIRMENTS are limiting patient from ADLs, IADLs, work, and leisure.   COMORBIDITIES may have co-morbidities  that affects occupational  performance. Patient will benefit from skilled OT to address above impairments and improve overall function.  MODIFICATION OR ASSISTANCE TO COMPLETE EVALUATION: Min-Moderate modification of tasks or assist with assess necessary to complete an evaluation.  OT OCCUPATIONAL PROFILE AND HISTORY: Detailed assessment: Review of records and additional review of physical, cognitive, psychosocial history related to current functional performance.  CLINICAL DECISION MAKING: Moderate - several treatment options, min-mod task modification necessary  REHAB POTENTIAL: Good  EVALUATION COMPLEXITY: Moderate   PLAN: OT FREQUENCY: 1x/week  OT DURATION: other: for 9 visits over 12 weeks (due to scheduling needs/concerns).  PLANNED INTERVENTIONS: self care/ADL training, therapeutic exercise, therapeutic activity, balance training, functional mobility training, aquatic therapy, patient/family education, cognitive remediation/compensation, visual/perceptual remediation/compensation, and DME and/or AE instructions  RECOMMENDED OTHER SERVICES: current with PT  CONSULTED AND AGREED WITH PLAN OF CARE: Patient  PLAN FOR NEXT  SESSION: visual compensation strategies and visual/cognitive HEP; environmental scanning with emphasis on head turns   [facility], OTR/L 05/05/2022, 2:20 PM

## 2022-05-05 NOTE — Therapy (Signed)
OUTPATIENT PHYSICAL THERAPY NEURO EVALUATION   Patient Name: Evan [name] MRN: [id number] DOB:[birth year], 76 y.o., male Today's Date: 05/05/2022   PCP: Josetta Huddle, MD  REFERRING PROVIDER: Kerney Elbe, DO    PT End of Session - 05/05/22 1022     Visit Number 1    Number of Visits 7   Plus eval   Date for PT Re-Evaluation 06/23/22    Authorization Type [employer]    PT Start Time 1020    PT Stop Time 1058    PT Time Calculation (min) 38 min    Activity Tolerance Patient tolerated treatment well    Behavior During Therapy WFL for tasks assessed/performed             Past Medical History:  Diagnosis Date   Acquired dilation of ascending aorta and aortic root ([place])    a.) measurements by TTE in 04/2021 --> root 40 mm and ascending aorta 43 mm   Anginal pain ([place])    Anxiety    Atrial flutter ([place]) 04/22/2021   a.) CHADS2VASc = 5 (age x 2, HTN, T2DM, CAD); b.) daily warfarin   Carotid disease, bilateral ([place])    a.) Doppler in 2019 --> 1-39% stenosis on RIGHT and 40-58% on LEFT   Cataracts, bilateral    Chronic anticoagulation    Warfarin   Chronic renal insufficiency    Coronary artery disease    a.) s/p 3v CABG in 1999   Depression    GERD (gastroesophageal reflux disease)    History of kidney stones    Hypercholesteremia    Hypertension    Kidney stone    Low testosterone    on TRT injections   Malignant melanoma ([place])    a.) face, nose, left leg; b.) Tx'd with chemotherapy + [facility] at [facility]   Myocardial infarction [facility])    Pulmonary emboli ([place])    S/P CABG x 3 1999   a.) LIMA-LAD, SVG-RCA, SVG-diagnonal   Sleep apnea    not using nocturnal PAP therapy   T2DM (type 2 diabetes mellitus) ([place])    Past Surgical History:  Procedure Laterality Date   CARDIAC CATHETERIZATION     x3     last 1999   CARDIOVERSION N/A 08/12/2021   Procedure: CARDIOVERSION;  Surgeon: Fay Records, MD;  Location: [place];  Service: Cardiovascular;   Laterality: N/A;   CATARACT EXTRACTION Bilateral    COLONOSCOPY WITH PROPOFOL N/A 09/26/2014   Procedure: COLONOSCOPY WITH PROPOFOL;  Surgeon: [name], MD;  [facility];  Service: Endoscopy;  Laterality: N/A;   CORONARY ARTERY BYPASS GRAFT N/A 1999   3v; LIMA-LAD, SVG-RCA, SVG-diagonal   HOLEP-LASER ENUCLEATION OF THE PROSTATE WITH MORCELLATION N/A 06/07/2021   Procedure: HOLEP-LASER ENUCLEATION OF THE PROSTATE WITH MORCELLATION;  Surgeon: Billey Co, MD;  Location: [facility];  Service: Urology;  Laterality: N/A;   KNEE ARTHROSCOPY Left    LAPAROSCOPIC CHOLECYSTECTOMY     MASS EXCISION  04/15/2012   Procedure: EXCISION MASS;  Surgeon: [name], MD;  Location: [place];  Service: ENT;  Laterality: Left;  Left tonsil biopsy   melanomia     several skin ca removed-nose, left ankle, parotid gland left, right nodes-being followed annually..   Patient Active Problem List   Diagnosis Date Noted   SDH (subdural hematoma) ([place]) 04/11/2022   Fall (on)(from) sidewalk curb, initial encounter 04/11/2022   History of melanoma excision 04/11/2022   Diabetes mellitus type 2 in obese ([place])  04/11/2022   DNR (do not resuscitate) 04/11/2022   Chronic anticoagulation 06/30/2021   Typical atrial flutter ([place]) 06/28/2021   Pure hypercholesterolemia 12/23/2013   Carotid artery disease ([place]) 12/23/2013   Essential hypertension, benign 12/23/2013   Angina decubitus ([place]) 12/23/2013   Coronary artery disease    Cancer ([place])    Depression    Pulmonary emboli (HCC)    Hypercholesteremia    Kidney stone    Chronic renal insufficiency     ONSET DATE: 04/12/2022   REFERRING DIAG: W54.5XAA (ICD-10-CM) - Subdural hematoma (HCC) W19.Merril Abbe (ICD-10-CM) - Fall, initial encounter   THERAPY DIAG:  Unsteadiness on feet  History of falling  Rationale for Evaluation and Treatment Rehabilitation  SUBJECTIVE:                                                                                                                                                                                               SUBJECTIVE STATEMENT: Pt reports he fell on pavement in mid-July and hit his head, cannot remember well but thought he lost consciousness for a few minutes. "My marbles are all out of place". Pt reports he drove to clinic for first time today, thought it went well. Pt reports today is the first day that he can remember that he has not had dizziness spell. Ambulated into clinic without AD. Pt states he typically gets dizzy when getting up out of bed in the AM or when standing up from recliner too quickly. Uses cane at home when he needs to.   Pt accompanied by: self  PERTINENT HISTORY: anxiety, aflutter, depression, HTN, PE, CABG, DMII. Presented to [facility] hospital on 7/14 after falling. Pt found to have small parafalcine SDH and right tentorial SDH , reporting nausea, HA, and dizziness   PAIN:  Are you having pain? No  PRECAUTIONS: Fall and HOH  WEIGHT BEARING RESTRICTIONS No  FALLS: Has patient fallen in last 6 months? Yes. Number of falls "I mean I stumble every now and again but that is because I am an old man". Pt reports he has fallen out of bed twice, one 2-3 weeks ago and another a few months ago, hit head both times. One fall on 7/14 when attempting to back up and fell posteriorly onto L occiput and had LOC  LIVING ENVIRONMENT: Lives with: lives with their spouse and 2 dogs Lives in: House/apartment Stairs: Yes: External: 2 steps; none Has following equipment at home: Lobbyist, Environmental consultant - 2 wheeled, Environmental consultant - 4 wheeled, shower chair, Grab bars, and Ramped entry  PLOF: Independent  PATIENT GOALS "I wanna be ambulatory  where people don't have to worry about me falling" "I wanna get back to work- it's fun"   OBJECTIVE:   DIAGNOSTIC FINDINGS: CT on 7/14  IMPRESSION: 1. Small volume parafalcine and right tentorial subdural hemorrhage. No mass effect. 2. No other acute  traumatic injury to the brain identified. 3. Posterior midline scalp hematoma or contusion without underlying skull fracture.  COGNITION: Overall cognitive status:  WFL but pt required extra time for processing due to memory deficits    SENSATION: WFL  VESTIBULAR ASSESSMENT: Smooth pursuit: saccadic intrusions on L side  Horizontal saccades: WFL on R, undershooting w/correction on L Vertical saccades: WFL on R, undershooting w/correction on L  COORDINATION: Heel to shin test: WFL bilaterally but required max visual cues to perform  Finger to nose test: WFL of RUE, mild dysmetria on LUE (slowed and undershooting)    POSTURE: rounded shoulders, forward head, increased thoracic kyphosis, and posterior pelvic tilt   LOWER EXTREMITY MMT:  tested in seated position   MMT Right Eval Left Eval  Hip flexion 4 4  Hip extension    Hip abduction 5 5  Hip adduction 5 5  Hip internal rotation    Hip external rotation    Knee flexion 5 5  Knee extension 5 5  Ankle dorsiflexion 5 4+  Ankle plantarflexion    Ankle inversion    Ankle eversion    (Blank rows = not tested)  BED MOBILITY:  Independent per pt   TRANSFERS: Assistive device utilized: None  Sit to stand: Modified independence Stand to sit: Modified independence noted poor body mechanics and bracing against mat table   GAIT: Gait pattern:  maintained BUE shoulder flexion to ~15 degrees, step through pattern, decreased arm swing- Right, decreased arm swing- Left, decreased stride length, lateral lean- Left, and decreased trunk rotation Distance walked: Various clinic distances  Assistive device utilized: None Level of assistance: Modified independence Comments: Noted mild instability w/turning due to increased speed of turn and narrow foot placement   FUNCTIONAL TESTs:   [facility] PT Assessment - 05/05/22 1050       Transfers   Five time sit to stand comments  12.96s w/BUEs braced on thighs and posterior lean on mat  table. No dizziness reported      Ambulation/Gait   Gait velocity 32.8' over 10.37s = 3.16 ft/s without AD             TODAY'S TREATMENT:  Next session    PATIENT EDUCATION: Education details: Eval findings, potential for BPPV, POC Person educated: Patient Education method: Explanation and Demonstration Education comprehension: verbalized understanding and needs further education   HOME EXERCISE PROGRAM: To be established     GOALS: Goals reviewed with patient? Yes  SHORT TERM GOALS: Target date: 05/26/2022  Further vestibular assessment to be performed and STG/LTGs to be established by vestibular therapist as appropriate Baseline: Goal status: INITIAL    LONG TERM GOALS: Target date: 06/16/2022  See STGs  Baseline:  Goal status: INITIAL  ASSESSMENT:  CLINICAL IMPRESSION: Patient is a 76 year old male referred to Neuro OPPT for subdural hematoma 2/2 fall.   Pt's PMH is significant for: anxiety, aflutter, depression, HTN, PE, CABG, DMII. The following deficits were present during the exam: dizziness w/positional changes, impaired safety awareness and abnormal smooth pursuits and saccades in horizontal and vertical directions. Based on fall history and dizziness w/position changes, pt is an incr risk for falls. Vestibular screen to be completed next session. Pt would benefit from  skilled PT to address these impairments and functional limitations to maximize functional mobility independence    OBJECTIVE IMPAIRMENTS Abnormal gait, decreased cognition, decreased coordination, decreased knowledge of condition, difficulty walking, decreased safety awareness, and dizziness.   ACTIVITY LIMITATIONS bending, squatting, stairs, bed mobility, and reach over head  PARTICIPATION LIMITATIONS: driving, community activity, and occupation  PERSONAL FACTORS Age, Fitness, Past/current experiences, Transportation, and 1 comorbidity: subdural hematoma  are also affecting patient's  functional outcome.   REHAB POTENTIAL: Good  CLINICAL DECISION MAKING: Stable/uncomplicated  EVALUATION COMPLEXITY: Low  PLAN: PT FREQUENCY: 1x/week  PT DURATION: 6 weeks  PLANNED INTERVENTIONS: Therapeutic exercises, Therapeutic activity, Neuromuscular re-education, Balance training, Gait training, Patient/Family education, Self Care, Vestibular training, Canalith repositioning, Visual/preceptual remediation/compensation, DME instructions, Aquatic Therapy, Dry Needling, Manual therapy, and Re-evaluation  PLAN FOR NEXT SESSION: Vestibular assessment    Kennley Schwandt E Jalin Alicea, PT, DPT 05/05/2022, 11:00 AM

## 2022-05-13 DIAGNOSIS — C4372 Malignant melanoma of left lower limb, including hip: Secondary | ICD-10-CM | POA: Diagnosis not present

## 2022-05-13 DIAGNOSIS — C4331 Malignant melanoma of nose: Secondary | ICD-10-CM | POA: Diagnosis not present

## 2022-05-13 DIAGNOSIS — Z85820 Personal history of malignant melanoma of skin: Secondary | ICD-10-CM | POA: Diagnosis not present

## 2022-05-13 DIAGNOSIS — Z08 Encounter for follow-up examination after completed treatment for malignant neoplasm: Secondary | ICD-10-CM | POA: Diagnosis not present

## 2022-05-19 ENCOUNTER — Ambulatory Visit: Payer: Worker's Compensation | Admitting: Physical Therapy

## 2022-05-19 ENCOUNTER — Encounter: Payer: Self-pay | Admitting: Physical Therapy

## 2022-05-19 DIAGNOSIS — H8113 Benign paroxysmal vertigo, bilateral: Secondary | ICD-10-CM

## 2022-05-19 DIAGNOSIS — E291 Testicular hypofunction: Secondary | ICD-10-CM | POA: Diagnosis not present

## 2022-05-19 DIAGNOSIS — S065XAA Traumatic subdural hemorrhage with loss of consciousness status unknown, initial encounter: Secondary | ICD-10-CM | POA: Diagnosis not present

## 2022-05-19 DIAGNOSIS — Z9181 History of falling: Secondary | ICD-10-CM | POA: Diagnosis not present

## 2022-05-19 DIAGNOSIS — R2681 Unsteadiness on feet: Secondary | ICD-10-CM | POA: Diagnosis not present

## 2022-05-19 DIAGNOSIS — R41842 Visuospatial deficit: Secondary | ICD-10-CM | POA: Diagnosis not present

## 2022-05-19 NOTE — Therapy (Signed)
OUTPATIENT PHYSICAL THERAPY NEURO TREATMENT NOTE   Patient Name: Evan [name] MRN: [id number] DOB:[birth year], 76 y.o., male Today's Date: 05/19/2022   PCP: Josetta Huddle, MD  REFERRING PROVIDER: Kerney Elbe, DO    PT End of Session - 05/19/22 2031     Visit Number 2    Number of Visits 7   Plus eval   Date for PT Re-Evaluation 06/23/22    Authorization Type [employer]    PT Start Time 0802    PT Stop Time 0843    PT Time Calculation (min) 41 min    Activity Tolerance Patient tolerated treatment well    Behavior During Therapy [facility] for tasks assessed/performed              Past Medical History:  Diagnosis Date   Acquired dilation of ascending aorta and aortic root ([place])    a.) measurements by TTE in 04/2021 --> root 40 mm and ascending aorta 43 mm   Anginal pain ([address])    Anxiety    Atrial flutter ([place]) 04/22/2021   a.) CHADS2VASc = 5 (age x 2, HTN, T2DM, CAD); b.) daily warfarin   Carotid disease, bilateral ([place])    a.) Doppler in 2019 --> 1-39% stenosis on RIGHT and 40-58% on LEFT   Cataracts, bilateral    Chronic anticoagulation    Warfarin   Chronic renal insufficiency    Coronary artery disease    a.) s/p 3v CABG in 1999   Depression    GERD (gastroesophageal reflux disease)    History of kidney stones    Hypercholesteremia    Hypertension    Kidney stone    Low testosterone    on TRT injections   Malignant melanoma ([place])    a.) face, nose, left leg; b.) Tx'd with chemotherapy + XRT at [facility]   Myocardial infarction [facility])    Pulmonary emboli ([place])    S/P CABG x 3 1999   a.) LIMA-LAD, SVG-RCA, SVG-diagnonal   Sleep apnea    not using nocturnal PAP therapy   T2DM (type 2 diabetes mellitus) ([place])    Past Surgical History:  Procedure Laterality Date   CARDIAC CATHETERIZATION     x3     last 1999   CARDIOVERSION N/A 08/12/2021   Procedure: CARDIOVERSION;  Surgeon: Fay Records, MD;  Location: [place];  Service:  Cardiovascular;  Laterality: N/A;   CATARACT EXTRACTION Bilateral    COLONOSCOPY WITH PROPOFOL N/A 09/26/2014   Procedure: COLONOSCOPY WITH PROPOFOL;  Surgeon: [name], MD;  [facility];  Service: Endoscopy;  Laterality: N/A;   CORONARY ARTERY BYPASS GRAFT N/A 1999   3v; LIMA-LAD, SVG-RCA, SVG-diagonal   HOLEP-LASER ENUCLEATION OF THE PROSTATE WITH MORCELLATION N/A 06/07/2021   Procedure: HOLEP-LASER ENUCLEATION OF THE PROSTATE WITH MORCELLATION;  Surgeon: Billey Co, MD;  Location: [facility];  Service: Urology;  Laterality: N/A;   KNEE ARTHROSCOPY Left    LAPAROSCOPIC CHOLECYSTECTOMY     MASS EXCISION  04/15/2012   Procedure: EXCISION MASS;  Surgeon: [name], MD;  Location: [place];  Service: ENT;  Laterality: Left;  Left tonsil biopsy   melanomia     several skin ca removed-nose, left ankle, parotid gland left, right nodes-being followed annually..   Patient Active Problem List   Diagnosis Date Noted   SDH (subdural hematoma) ([place]) 04/11/2022   Fall (on)(from) sidewalk curb, initial encounter 04/11/2022   History of melanoma excision 04/11/2022   Diabetes mellitus type 2 in  obese ([place]) 04/11/2022   DNR (do not resuscitate) 04/11/2022   Chronic anticoagulation 06/30/2021   Typical atrial flutter ([place]) 06/28/2021   Pure hypercholesterolemia 12/23/2013   Carotid artery disease ([place]) 12/23/2013   Essential hypertension, benign 12/23/2013   Angina decubitus ([place]) 12/23/2013   Coronary artery disease    Cancer ([place])    Depression    Pulmonary emboli (HCC)    Hypercholesteremia    Kidney stone    Chronic renal insufficiency     ONSET DATE: 04/12/2022   REFERRING DIAG: Y86.5XAA (ICD-10-CM) - Subdural hematoma (HCC) W19.Merril Abbe (ICD-10-CM) - Fall, initial encounter   THERAPY DIAG:  BPPV (benign paroxysmal positional vertigo), bilateral  Rationale for Evaluation and Treatment Rehabilitation  SUBJECTIVE:                                                                                                                                                                                               SUBJECTIVE STATEMENT: Pt reports he had a very dizzy episode yesterday - occurred when he got down on ground to work on his car and got very dizzy when he tilted his head back to look up under the vehicle   Pt accompanied by: self  PERTINENT HISTORY: anxiety, aflutter, depression, HTN, PE, CABG, DMII. Presented to [facility] hospital on 7/14 after falling. Pt found to have small parafalcine SDH and right tentorial SDH , reporting nausea, HA, and dizziness   PAIN:  Are you having pain? No  PRECAUTIONS: Fall and HOH  WEIGHT BEARING RESTRICTIONS No  FALLS: Has patient fallen in last 6 months? Yes. Number of falls "I mean I stumble every now and again but that is because I am an old man". Pt reports he has fallen out of bed twice, one 2-3 weeks ago and another a few months ago, hit head both times. One fall on 7/14 when attempting to back up and fell posteriorly onto L occiput and had LOC  LIVING ENVIRONMENT: Lives with: lives with their spouse and 2 dogs Lives in: House/apartment Stairs: Yes: External: 2 steps; none Has following equipment at home: Lobbyist, Environmental consultant - 2 wheeled, Environmental consultant - 4 wheeled, shower chair, Grab bars, and Ramped entry  PLOF: Independent  PATIENT GOALS "I wanna be ambulatory where people don't have to worry about me falling" "I wanna get back to work- it's fun"   OBJECTIVE:   Rt and Lt Dix-Hallpike tests (+) with nystagmus and c/o vertigo in both test positions:   greater intensity of nystagmus noted in Lt Dix-Hallpike test position than in Rt Dix-Hallpike test position  Lt Epley maneuver performed  3 reps for Lt BPPV posterior canalithiasis;  Rt Epley maneuver performed 2 reps   PATIENT EDUCATION: Education details: BPPV etiology Person educated: Patient Education method: Customer service manager Education comprehension:  verbalized understanding   HOME EXERCISE PROGRAM: To be established     GOALS: Goals reviewed with patient? Yes  SHORT TERM GOALS: Target date: 05/26/2022  Further vestibular assessment to be performed and STG/LTGs to be established by vestibular therapist as appropriate Baseline: Goal status: INITIAL    LONG TERM GOALS: Target date: 06/16/2022  See STGs  Baseline:  Goal status: INITIAL  ASSESSMENT:  CLINICAL IMPRESSION: Patient has multi canal BPPV posterior canalithiasis with (+) Rt and Lt Dix-Hallpike tests; pt was treated with 3 reps Epley maneuver for Lt BPPV and 2 reps for Rt BPPV; pt reported feeling nauseous after 2nd rep of Epley for Rt BPPV.  Will cont to assess and treat as appropriate.     OBJECTIVE IMPAIRMENTS Abnormal gait, decreased cognition, decreased coordination, decreased knowledge of condition, difficulty walking, decreased safety awareness, and dizziness.   ACTIVITY LIMITATIONS bending, squatting, stairs, bed mobility, and reach over head  PARTICIPATION LIMITATIONS: driving, community activity, and occupation  PERSONAL FACTORS Age, Fitness, Past/current experiences, Transportation, and 1 comorbidity: subdural hematoma  are also affecting patient's functional outcome.   REHAB POTENTIAL: Good  CLINICAL DECISION MAKING: Stable/uncomplicated  EVALUATION COMPLEXITY: Low  PLAN: PT FREQUENCY: 1x/week  PT DURATION: 6 weeks  PLANNED INTERVENTIONS: Therapeutic exercises, Therapeutic activity, Neuromuscular re-education, Balance training, Gait training, Patient/Family education, Self Care, Vestibular training, Canalith repositioning, Visual/preceptual remediation/compensation, DME instructions, Aquatic Therapy, Dry Needling, Manual therapy, and Re-evaluation  PLAN FOR NEXT SESSION: Check BPPV - bilateral - treat with Epley for Rt BPPV as not fully resolved in today's session   Khiley Lieser, [place], PT 05/19/2022, 8:36 PM

## 2022-05-26 ENCOUNTER — Ambulatory Visit: Payer: Worker's Compensation | Admitting: Physical Therapy

## 2022-05-26 DIAGNOSIS — R41842 Visuospatial deficit: Secondary | ICD-10-CM | POA: Diagnosis not present

## 2022-05-26 DIAGNOSIS — S065XAA Traumatic subdural hemorrhage with loss of consciousness status unknown, initial encounter: Secondary | ICD-10-CM | POA: Diagnosis not present

## 2022-05-26 DIAGNOSIS — R2681 Unsteadiness on feet: Secondary | ICD-10-CM | POA: Diagnosis not present

## 2022-05-26 DIAGNOSIS — H8113 Benign paroxysmal vertigo, bilateral: Secondary | ICD-10-CM

## 2022-05-26 DIAGNOSIS — Z9181 History of falling: Secondary | ICD-10-CM | POA: Diagnosis not present

## 2022-05-26 NOTE — Therapy (Signed)
OUTPATIENT PHYSICAL THERAPY NEURO TREATMENT NOTE   Patient Name: Evan [name] MRN: [id number] DOB:05/21/1946, 76 y.o., male Today's Date: 05/27/2022   PCP: Josetta Huddle, MD  REFERRING PROVIDER: Kerney Elbe, DO    PT End of Session - 05/27/22 2045     Visit Number 3    Number of Visits 7   Plus eval   Date for PT Re-Evaluation 06/23/22    Authorization Type [employer] Medicare    PT Start Time 0848    PT Stop Time 0932    PT Time Calculation (min) 44 min    Activity Tolerance Patient tolerated treatment well    Behavior During Therapy [facility] for tasks assessed/performed               Past Medical History:  Diagnosis Date   Acquired dilation of ascending aorta and aortic root ([place])    a.) measurements by TTE in 04/2021 --> root 40 mm and ascending aorta 43 mm   Anginal pain ([place])    Anxiety    Atrial flutter ([place]) 04/22/2021   a.) CHADS2VASc = 5 (age x 2, HTN, T2DM, CAD); b.) daily warfarin   Carotid disease, bilateral ([place])    a.) Doppler in 2019 --> 1-39% stenosis on RIGHT and 40-58% on LEFT   Cataracts, bilateral    Chronic anticoagulation    Warfarin   Chronic renal insufficiency    Coronary artery disease    a.) s/p 3v CABG in 1999   Depression    GERD (gastroesophageal reflux disease)    History of kidney stones    Hypercholesteremia    Hypertension    Kidney stone    Low testosterone    on TRT injections   Malignant melanoma ([place])    a.) face, nose, left leg; b.) Tx'd with chemotherapy + XRT at [facility]   Myocardial infarction [facility])    Pulmonary emboli ([place])    S/P CABG x 3 1999   a.) LIMA-LAD, SVG-RCA, SVG-diagnonal   Sleep apnea    not using nocturnal PAP therapy   T2DM (type 2 diabetes mellitus) ([place])    Past Surgical History:  Procedure Laterality Date   CARDIAC CATHETERIZATION     x3     last 1999   CARDIOVERSION N/A 08/12/2021   Procedure: CARDIOVERSION;  Surgeon: Fay Records, MD;  Location: [place];  Service:  Cardiovascular;  Laterality: N/A;   CATARACT EXTRACTION Bilateral    COLONOSCOPY WITH PROPOFOL N/A 09/26/2014   Procedure: COLONOSCOPY WITH PROPOFOL;  Surgeon: [name], MD;  [facility];  Service: Endoscopy;  Laterality: N/A;   CORONARY ARTERY BYPASS GRAFT N/A 1999   3v; LIMA-LAD, SVG-RCA, SVG-diagonal   HOLEP-LASER ENUCLEATION OF THE PROSTATE WITH MORCELLATION N/A 06/07/2021   Procedure: HOLEP-LASER ENUCLEATION OF THE PROSTATE WITH MORCELLATION;  Surgeon: Billey Co, MD;  Location: [facility];  Service: Urology;  Laterality: N/A;   KNEE ARTHROSCOPY Left    LAPAROSCOPIC CHOLECYSTECTOMY     MASS EXCISION  04/15/2012   Procedure: EXCISION MASS;  Surgeon: [name], MD;  Location: [place];  Service: ENT;  Laterality: Left;  Left tonsil biopsy   melanomia     several skin ca removed-nose, left ankle, parotid gland left, right nodes-being followed annually..   Patient Active Problem List   Diagnosis Date Noted   SDH (subdural hematoma) ([place]) 04/11/2022   Fall (on)(from) sidewalk curb, initial encounter 04/11/2022   History of melanoma excision 04/11/2022   Diabetes mellitus type 2  in obese ([place]) 04/11/2022   DNR (do not resuscitate) 04/11/2022   Chronic anticoagulation 06/30/2021   Typical atrial flutter ([place]) 06/28/2021   Pure hypercholesterolemia 12/23/2013   Carotid artery disease ([place]) 12/23/2013   Essential hypertension, benign 12/23/2013   Angina decubitus ([place]) 12/23/2013   Coronary artery disease    Cancer ([place])    Depression    Pulmonary emboli (HCC)    Hypercholesteremia    Kidney stone    Chronic renal insufficiency     ONSET DATE: 04/12/2022   REFERRING DIAG: K80.5XAA (ICD-10-CM) - Subdural hematoma (HCC) W19.Merril Abbe (ICD-10-CM) - Fall, initial encounter   THERAPY DIAG:  BPPV (benign paroxysmal positional vertigo), bilateral  Unsteadiness on feet  Rationale for Evaluation and Treatment Rehabilitation  SUBJECTIVE:                                                                                                                                                                                               SUBJECTIVE STATEMENT: Pt reports dizziness seems to be resolved - says he was able to get down on ground yesterday and work on his vehicle and did not get dizzy ; pt states he talked to [facility] case manager - pt states the therapy is supposed to be filed with Worker's Comp   Pt accompanied by: self  PERTINENT HISTORY: anxiety, aflutter, depression, HTN, PE, CABG, DMII. Presented to [facility] hospital on 7/14 after falling. Pt found to have small parafalcine SDH and right tentorial SDH , reporting nausea, HA, and dizziness   PAIN:  Are you having pain? No  PRECAUTIONS: Fall and HOH  WEIGHT BEARING RESTRICTIONS No  FALLS: Has patient fallen in last 6 months? Yes. Number of falls "I mean I stumble every now and again but that is because I am an old man". Pt reports he has fallen out of bed twice, one 2-3 weeks ago and another a few months ago, hit head both times. One fall on 7/14 when attempting to back up and fell posteriorly onto L occiput and had LOC  LIVING ENVIRONMENT: Lives with: lives with their spouse and 2 dogs Lives in: House/apartment Stairs: Yes: External: 2 steps; none Has following equipment at home: Lobbyist, Environmental consultant - 2 wheeled, Environmental consultant - 4 wheeled, shower chair, Grab bars, and Ramped entry  PLOF: Independent  PATIENT GOALS "I wanna be ambulatory where people don't have to worry about me falling" "I wanna get back to work- it's fun"    OBJECTIVE:   Rt and Lt Dix-Hallpike tests (-) with no nystagmus and no c/o vertigo in either test position;  Rt and  Lt sidelying tests (-) with no nystagmus and no c/o vertigo in either test position  TherEx: 5x sit to stand score 16.97 secs without UE support from standard chair  Gait:  6" walk test - 1120' without device with SBA; gait velocity 12.28 secs = 2.67 ft/sec without  device  Neuro Re-ed:  TUG score 10.78 secs without device  PATIENT EDUCATION: Education details: BPPV etiology Person educated: Patient Education method: Customer service manager Education comprehension: verbalized understanding   HOME EXERCISE PROGRAM: To be established     GOALS: Goals reviewed with patient? Yes  SHORT TERM GOALS: Target date: 05/26/2022  Further vestibular assessment to be performed and STG/LTGs to be established by vestibular therapist as appropriate Baseline: Goal status: Goal met 05-27-22    LONG TERM GOALS: Target date: 06/16/2022  See STGs  Baseline:  Goal status: INITIAL  ASSESSMENT:  CLINICAL IMPRESSION: Patient has no c/o vertigo with any positional testing.  Both Rt and Lt Dix-Hallpike tests (-) with no nystagmus and no c/o vertigo, indicative of resolution of BPPV.  TUG score 10.78 secs without use of device is not indicative of fall risk.  Pt able to amb. 1120' in 6" walk test with no dyspnea noted upon completion of test.  Pt reports he feels his legs are weaker than they were prior to accident.   Will cont to assess and treat as appropriate.     OBJECTIVE IMPAIRMENTS Abnormal gait, decreased cognition, decreased coordination, decreased knowledge of condition, difficulty walking, decreased safety awareness, and dizziness.   ACTIVITY LIMITATIONS bending, squatting, stairs, bed mobility, and reach over head  PARTICIPATION LIMITATIONS: driving, community activity, and occupation  PERSONAL FACTORS Age, Fitness, Past/current experiences, Transportation, and 1 comorbidity: subdural hematoma  are also affecting patient's functional outcome.   REHAB POTENTIAL: Good  CLINICAL DECISION MAKING: Stable/uncomplicated  EVALUATION COMPLEXITY: Low  PLAN: PT FREQUENCY: 1x/week  PT DURATION: 6 weeks  PLANNED INTERVENTIONS: Therapeutic exercises, Therapeutic activity, Neuromuscular re-education, Balance training, Gait training, Patient/Family  education, Self Care, Vestibular training, Canalith repositioning, Visual/preceptual remediation/compensation, DME instructions, Aquatic Therapy, Dry Needling, Manual therapy, and Re-evaluation  PLAN FOR NEXT SESSION: bil. LE strengthening and endurance training   Crecencio Kwiatek, [place], PT 05/27/2022, 8:50 PM

## 2022-05-27 ENCOUNTER — Encounter: Payer: Self-pay | Admitting: Physical Therapy

## 2022-05-27 ENCOUNTER — Other Ambulatory Visit: Payer: Self-pay | Admitting: Cardiology

## 2022-05-28 ENCOUNTER — Other Ambulatory Visit: Payer: Self-pay

## 2022-05-28 MED ORDER — OMEGA-3-ACID ETHYL ESTERS 1 G PO CAPS: 2.0000 | ORAL_CAPSULE | Freq: Two times a day (BID) | ORAL | 1 refills | Status: AC

## 2022-06-03 ENCOUNTER — Ambulatory Visit: Payer: Medicare HMO

## 2022-06-03 ENCOUNTER — Ambulatory Visit: Payer: Self-pay | Admitting: Physical Therapy

## 2022-06-08 NOTE — Therapy (Signed)
OUTPATIENT OCCUPATIONAL THERAPY NEURO EVALUATION  Patient Name: CLARANCE BOLLARD MRN: [id number] DOB:1945/11/26, 76 y.o., male Today's Date: 06/09/2022  PCP: Josetta Huddle, MD REFERRING PROVIDER: Kerney Elbe, DO    OT End of Session - 06/09/22 [phone]     Visit Number 2    Number of Visits 9    Date for OT Re-Evaluation 08/03/22    Authorization Type [employer] 2023  covered @ 100%  $800 ded Met  $6500 OPM Met  VL: 60 (combined)  Auth Not Reqd.   [employer]  $20 copay  No Ded  $3400 OPM $0 Met  VL:MN    Authorization - Visit Number 1    Authorization - Number of Visits 10    Progress Note Due on Visit 10    OT Start Time 0806    OT Stop Time 0845    OT Time Calculation (min) 39 min    Activity Tolerance Patient tolerated treatment well    Behavior During Therapy [facility] for tasks assessed/performed              Past Medical History:  Diagnosis Date   Acquired dilation of ascending aorta and aortic root ([place])    a.) measurements by TTE in 04/2021 --> root 40 mm and ascending aorta 43 mm   Anginal pain (HCC)    Anxiety    Atrial flutter ([place]) 04/22/2021   a.) CHADS2VASc = 5 (age x 2, HTN, T2DM, CAD); b.) daily warfarin   Carotid disease, bilateral ([place])    a.) Doppler in 2019 --> 1-39% stenosis on RIGHT and 40-58% on LEFT   Cataracts, bilateral    Chronic anticoagulation    Warfarin   Chronic renal insufficiency    Coronary artery disease    a.) s/p 3v CABG in 1999   Depression    GERD (gastroesophageal reflux disease)    History of kidney stones    Hypercholesteremia    Hypertension    Kidney stone    Low testosterone    on TRT injections   Malignant melanoma ([place])    a.) face, nose, left leg; b.) Tx'd with chemotherapy + XRT at [facility]   Myocardial infarction [facility])    Pulmonary emboli ([place])    S/P CABG x 3 1999   a.) LIMA-LAD, SVG-RCA, SVG-diagnonal   Sleep apnea    not using nocturnal PAP therapy   T2DM (type 2 diabetes mellitus) ([place])    Past Surgical  History:  Procedure Laterality Date   CARDIAC CATHETERIZATION     x3     last 1999   CARDIOVERSION N/A 08/12/2021   Procedure: CARDIOVERSION;  Surgeon: Fay Records, MD;  Location: [place];  Service: Cardiovascular;  Laterality: N/A;   CATARACT EXTRACTION Bilateral    COLONOSCOPY WITH PROPOFOL N/A 09/26/2014   Procedure: COLONOSCOPY WITH PROPOFOL;  Surgeon: [name], MD;  [facility];  Service: Endoscopy;  Laterality: N/A;   CORONARY ARTERY BYPASS GRAFT N/A 1999   3v; LIMA-LAD, SVG-RCA, SVG-diagonal   HOLEP-LASER ENUCLEATION OF THE PROSTATE WITH MORCELLATION N/A 06/07/2021   Procedure: HOLEP-LASER ENUCLEATION OF THE PROSTATE WITH MORCELLATION;  Surgeon: Billey Co, MD;  Location: [facility];  Service: Urology;  Laterality: N/A;   KNEE ARTHROSCOPY Left    LAPAROSCOPIC CHOLECYSTECTOMY     MASS EXCISION  04/15/2012   Procedure: EXCISION MASS;  Surgeon: [name], MD;  Location: [place];  Service: ENT;  Laterality: Left;  Left tonsil biopsy   melanomia  several skin ca removed-nose, left ankle, parotid gland left, right nodes-being followed annually..   Patient Active Problem List   Diagnosis Date Noted   SDH (subdural hematoma) ([place]) 04/11/2022   Fall (on)(from) sidewalk curb, initial encounter 04/11/2022   History of melanoma excision 04/11/2022   Diabetes mellitus type 2 in obese ([place]) 04/11/2022   DNR (do not resuscitate) 04/11/2022   Chronic anticoagulation 06/30/2021   Typical atrial flutter ([place]) 06/28/2021   Pure hypercholesterolemia 12/23/2013   Carotid artery disease ([place]) 12/23/2013   Essential hypertension, benign 12/23/2013   Angina decubitus ([place]) 12/23/2013   Coronary artery disease    Cancer ([place])    Depression    Pulmonary emboli ([place])    Hypercholesteremia    Kidney stone    Chronic renal insufficiency     ONSET DATE:  04/11/22  REFERRING DIAG: R15.5XAA (ICD-10-CM) - Subdural hematoma (HCC) W19.Merril Abbe (ICD-10-CM) - Fall, initial  encounter   THERAPY DIAG:  Visuospatial deficit  Other symptoms and signs involving cognitive functions following other nontraumatic intracranial hemorrhage  Unsteadiness on feet  Rationale for Evaluation and Treatment Rehabilitation  SUBJECTIVE:   SUBJECTIVE STATEMENT: "I forgot my cell phone this morning"   Pt accompanied by: self  PERTINENT HISTORY:  76 y.o. male presents to [facility] hospital on 7/14 after falling. Pt found to have small parafalcine SDH and right tentorial SDH , reporting nausea, HA, and dizziness   Fell stepping on traffic cone at work.  Doctor said if I felt like I could drive, I could.  I drove today.  Pt reports that he has had some dizziness/"spinning" but not currently.    PMH:  includes anxiety, aflutter, depression, HTN, PE, CABG, DMII.   PRECAUTIONS: Fall  PAIN:  Are you having pain? No  FALLS: Has patient fallen in last 6 months? Yes. 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  OBJECTIVE:   VISION ASSESSMENT (from eval): Impaired Ocular ROM: WFL Tracking/Visual pursuits: Decreased smoothness with horizontal tracking and to the L, particularly with R eye, with L eye--mild decr smoothness with tracking superiorly Saccades: additional eye shifts occurred during testing and to occurred when looking to the L Convergence: WFL Visual Fields: pt appeared to demo decr awareness to L visual field when stimulus on both sides simultaneously Environmental scanning with ambulating in min distracting  environment with 10/15 items located on first pass ( 67%) and additional 3 located on 2nd pass, cueing needed for remaining 2 items.  3/5 items missed initially were located on L side.     TODAY'S TREATMENT:  Completing 24 piece puzzle with min-mod cueing for problem solving and visual scanning.    Environmental Scanning/navigation (ambulating) in mod distracting environment with 11/15 accuracy = 73% on first pass. Pt able to locate 2 remaining items on second pass.  But needed min-mod cueing for remaining 2.  Discussed errors (all missed items on L side) and need to perform head turns to compensate for visual deficits.     PATIENT EDUCATION: Education details: Visual/Cognitive HEP; Audiological scientist Person educated: Patient Education method: Explanation and Handouts  Education comprehension: verbalized understanding   HOME EXERCISE PROGRAM: 06/09/22:  Visual/Cognitive HEP; Visual Compensation strategies    GOALS: Potential Goals reviewed with patient? Yes  SHORT TERM GOALS: Target date: 06/04/22  Pt will verbalize understanding of visual compensation strategies. Goal status: INITIAL  2.  Pt will verbalize understanding of HEP for visual and cognitive deficits. Goal status: INITIAL  3.  Pt will perform environmental scanning/navigation in min distracting environment with at least 90% accuracy for incr safety. Baseline:  67% Goal status: INITIAL    LONG TERM GOALS: Target date: 08/03/22  Pt will perform environmental scanning/navigation in mod distracting environment with at least 90% accuracy for incr safety. Goal status: INITIAL  2.  Pt will perform simple environmental scanning/navigation with at least 1 other cognitive/physical task with at least 85% accuracy. Goal status: INITIAL  3.  Pt will verbalize understanding of memory/cognitive compensation strategies for ADLs/IADLs. Goal status: INITIAL   ASSESSMENT:  CLINICAL IMPRESSION: Pt is progressing  towards goals, but continues to demo difficulty with visual scanning and cognition.  Pt with decr environmental scanning/missed items on L side.  PERFORMANCE DEFICITS in functional skills including IADLs, mobility, balance, decreased knowledge of precautions, vision, and vestibular, cognitive skills including attention, memory, and safety awareness, and psychosocial skills including environmental adaptation and habits.   IMPAIRMENTS are limiting patient from ADLs, IADLs, work, and leisure.   COMORBIDITIES may have co-morbidities  that affects occupational performance. Patient will benefit from skilled OT to address above impairments and improve overall function.  MODIFICATION OR ASSISTANCE TO COMPLETE EVALUATION: Min-Moderate modification of tasks or assist with assess necessary to complete an evaluation.  OT OCCUPATIONAL PROFILE AND HISTORY: Detailed assessment: Review of records and additional review of physical, cognitive, psychosocial history related to current functional performance.  CLINICAL DECISION MAKING: Moderate - several treatment options, min-mod task modification necessary  REHAB POTENTIAL: Good  EVALUATION COMPLEXITY: Moderate   PLAN: OT FREQUENCY: 1x/week  OT DURATION: other: for 9 visits over 12 weeks (due to scheduling needs/concerns).  PLANNED INTERVENTIONS: self care/ADL training, therapeutic exercise, therapeutic activity, balance training, functional mobility training, aquatic therapy, patient/family education, cognitive remediation/compensation, visual/perceptual remediation/compensation, and DME and/or AE instructions  RECOMMENDED OTHER SERVICES: current with PT  CONSULTED AND AGREED WITH PLAN OF CARE: Patient  PLAN FOR NEXT SESSION: small peg design/constant therapy, memory compensation strategies, environmental scanning with emphasis on head turns   [place], OTR/L 06/09/2022, 9:08 AM

## 2022-06-09 ENCOUNTER — Ambulatory Visit: Payer: Worker's Compensation | Attending: Internal Medicine | Admitting: Occupational Therapy

## 2022-06-09 ENCOUNTER — Ambulatory Visit: Payer: Worker's Compensation | Admitting: Physical Therapy

## 2022-06-09 ENCOUNTER — Encounter: Payer: Self-pay | Admitting: Occupational Therapy

## 2022-06-09 ENCOUNTER — Encounter: Payer: Self-pay | Admitting: Physical Therapy

## 2022-06-09 DIAGNOSIS — I69218 Other symptoms and signs involving cognitive functions following other nontraumatic intracranial hemorrhage: Secondary | ICD-10-CM | POA: Insufficient documentation

## 2022-06-09 DIAGNOSIS — R2681 Unsteadiness on feet: Secondary | ICD-10-CM

## 2022-06-09 DIAGNOSIS — R41842 Visuospatial deficit: Secondary | ICD-10-CM | POA: Insufficient documentation

## 2022-06-09 NOTE — Therapy (Signed)
OUTPATIENT PHYSICAL THERAPY NEURO TREATMENT NOTE   Patient Name: Evan [name] MRN: [id number] DOB:Aug 06, 1946, 76 y.o., male Today's Date: 06/09/2022   PCP: Josetta Huddle, MD  REFERRING PROVIDER: Kerney Elbe, DO    PT End of Session - 06/09/22 1907     Visit Number 4    Number of Visits 7   Plus eval   Date for PT Re-Evaluation 06/23/22    Authorization Type [employer]    Authorization Time Period 05-05-22 - 06-23-22    Authorization - Visit Number 4    Authorization - Number of Visits 7    PT Start Time 0850    PT Stop Time 0930    PT Time Calculation (min) 40 min    Activity Tolerance Patient tolerated treatment well    Behavior During Therapy [facility] for tasks assessed/performed                Past Medical History:  Diagnosis Date   Acquired dilation of ascending aorta and aortic root ([place])    a.) measurements by TTE in 04/2021 --> root 40 mm and ascending aorta 43 mm   Anginal pain (HCC)    Anxiety    Atrial flutter ([place]) 04/22/2021   a.) CHADS2VASc = 5 (age x 2, HTN, T2DM, CAD); b.) daily warfarin   Carotid disease, bilateral ([place])    a.) Doppler in 2019 --> 1-39% stenosis on RIGHT and 40-58% on LEFT   Cataracts, bilateral    Chronic anticoagulation    Warfarin   Chronic renal insufficiency    Coronary artery disease    a.) s/p 3v CABG in 1999   Depression    GERD (gastroesophageal reflux disease)    History of kidney stones    Hypercholesteremia    Hypertension    Kidney stone    Low testosterone    on TRT injections   Malignant melanoma ([place])    a.) face, nose, left leg; b.) Tx'd with chemotherapy + XRT at [facility]   Myocardial infarction [facility])    Pulmonary emboli ([place])    S/P CABG x 3 1999   a.) LIMA-LAD, SVG-RCA, SVG-diagnonal   Sleep apnea    not using nocturnal PAP therapy   T2DM (type 2 diabetes mellitus) ([place])    Past Surgical History:  Procedure Laterality Date   CARDIAC CATHETERIZATION     x3     last 1999    CARDIOVERSION N/A 08/12/2021   Procedure: CARDIOVERSION;  Surgeon: Fay Records, MD;  Location: [place];  Service: Cardiovascular;  Laterality: N/A;   CATARACT EXTRACTION Bilateral    COLONOSCOPY WITH PROPOFOL N/A 09/26/2014   Procedure: COLONOSCOPY WITH PROPOFOL;  Surgeon: [name], MD;  [facility];  Service: Endoscopy;  Laterality: N/A;   CORONARY ARTERY BYPASS GRAFT N/A 1999   3v; LIMA-LAD, SVG-RCA, SVG-diagonal   HOLEP-LASER ENUCLEATION OF THE PROSTATE WITH MORCELLATION N/A 06/07/2021   Procedure: HOLEP-LASER ENUCLEATION OF THE PROSTATE WITH MORCELLATION;  Surgeon: Billey Co, MD;  Location: [facility];  Service: Urology;  Laterality: N/A;   KNEE ARTHROSCOPY Left    LAPAROSCOPIC CHOLECYSTECTOMY     MASS EXCISION  04/15/2012   Procedure: EXCISION MASS;  Surgeon: [name], MD;  Location: [place];  Service: ENT;  Laterality: Left;  Left tonsil biopsy   melanomia     several skin ca removed-nose, left ankle, parotid gland left, right nodes-being followed annually..   Patient Active Problem List   Diagnosis Date Noted  SDH (subdural hematoma) ([place]) 04/11/2022   Fall (on)(from) sidewalk curb, initial encounter 04/11/2022   History of melanoma excision 04/11/2022   Diabetes mellitus type 2 in obese ([place]) 04/11/2022   DNR (do not resuscitate) 04/11/2022   Chronic anticoagulation 06/30/2021   Typical atrial flutter ([place]) 06/28/2021   Pure hypercholesterolemia 12/23/2013   Carotid artery disease ([place]) 12/23/2013   Essential hypertension, benign 12/23/2013   Angina decubitus ([place]) 12/23/2013   Coronary artery disease    Cancer ([place])    Depression    Pulmonary emboli ([place])    Hypercholesteremia    Kidney stone    Chronic renal insufficiency     ONSET DATE: 04/12/2022   REFERRING DIAG: H84.5XAA (ICD-10-CM) - Subdural hematoma (HCC) W19.Merril Abbe (ICD-10-CM) - Fall, initial encounter   THERAPY DIAG:  Unsteadiness on feet  Rationale for Evaluation and  Treatment Rehabilitation  SUBJECTIVE:                                                                                                                                                                                              SUBJECTIVE STATEMENT: Pt reports the lights in church yesterday seemed to bother him - made him feel very light-headed, so he just sat down and it resolved after about 2"-3" - states it may have been related to his a-fib.; pt states it was not the feeling of vertigo   Pt accompanied by: self  PERTINENT HISTORY: anxiety, aflutter, depression, HTN, PE, CABG, DMII. Presented to [facility] hospital on 7/14 after falling. Pt found to have small parafalcine SDH and right tentorial SDH , reporting nausea, HA, and dizziness   PAIN:  Are you having pain? No  PRECAUTIONS: Fall and HOH  WEIGHT BEARING RESTRICTIONS No  FALLS: Has patient fallen in last 6 months? Yes. Number of falls "I mean I stumble every now and again but that is because I am an old man". Pt reports he has fallen out of bed twice, one 2-3 weeks ago and another a few months ago, hit head both times. One fall on 7/14 when attempting to back up and fell posteriorly onto L occiput and had LOC  LIVING ENVIRONMENT: Lives with: lives with their spouse and 2 dogs Lives in: House/apartment Stairs: Yes: External: 2 steps; none Has following equipment at home: Lobbyist, Environmental consultant - 2 wheeled, Environmental consultant - 4 wheeled, shower chair, Grab bars, and Ramped entry  PLOF: Independent  PATIENT GOALS "I wanna be ambulatory where people don't have to worry about me falling" "I wanna get back to work- it's fun"    OBJECTIVE:   TherEx: 5x sit  to stand from mat table without UE support with SBA - feet on Airex for challenge with balance   Leg press 60# 3 sets 10 reps bil. LE's:  70# 1 set 10 reps  Standing hip flexion, extension, abduction with 4# weight on each leg - 10 reps each Step ups onto 6" step 10 reps each leg with  UE support for balance  Sidestepping with squats inside // bars 10' x 2 reps  NeuroRe-ed: Pt performed marching on Airex 5 reps each leg with CGA Tap ups onto 6" step 10 reps each leg alternating Pt amb. 50' x 1 rep with horizontal head turns; 50' x 1 rep with vertical head turns with SBA - pt reported no dizziness with amb. With either direction  Gait:  - pt amb. Approx. 650' on flat, even surface without assistive device with supervision Negotiated steps (4 steps x 3 reps) for 12 steps total with Rt hand rail using step over step sequence     PATIENT EDUCATION: Education details:  recommended pt start walking program at home for strengthening and endurance - instructed him to use walking stick for safety and assist with balance prn Person educated: Patient Education method: Customer service manager Education comprehension: verbalized understanding   HOME EXERCISE PROGRAM: To be established     GOALS: Goals reviewed with patient? Yes  SHORT TERM GOALS: Target date: 05/26/2022  Further vestibular assessment to be performed and STG/LTGs to be established by vestibular therapist as appropriate Baseline: Goal status: Goal met 05-27-22    LONG TERM GOALS: Target date: 06/16/2022  See STGs  Baseline:  Goal status: INITIAL  ASSESSMENT:  CLINICAL IMPRESSION: PT session focused on strengthening and endurance training; pt able to amb. With horizontal & vertical head turns without LOB and without c/o dizziness with either direction.  Pt reported minimal fatigue at end of session.  Cont with POC.     OBJECTIVE IMPAIRMENTS Abnormal gait, decreased cognition, decreased coordination, decreased knowledge of condition, difficulty walking, decreased safety awareness, and dizziness.   ACTIVITY LIMITATIONS bending, squatting, stairs, bed mobility, and reach over head  PARTICIPATION LIMITATIONS: driving, community activity, and occupation  PERSONAL FACTORS Age, Fitness,  Past/current experiences, Transportation, and 1 comorbidity: subdural hematoma  are also affecting patient's functional outcome.   REHAB POTENTIAL: Good  CLINICAL DECISION MAKING: Stable/uncomplicated  EVALUATION COMPLEXITY: Low  PLAN: PT FREQUENCY: 1x/week  PT DURATION: 6 weeks  PLANNED INTERVENTIONS: Therapeutic exercises, Therapeutic activity, Neuromuscular re-education, Balance training, Gait training, Patient/Family education, Self Care, Vestibular training, Canalith repositioning, Visual/preceptual remediation/compensation, DME instructions, Aquatic Therapy, Dry Needling, Manual therapy, and Re-evaluation  PLAN FOR NEXT SESSION: bil. LE strengthening and endurance training   Khristie Sak, [place], PT 06/09/2022, 7:29 PM

## 2022-06-09 NOTE — Patient Instructions (Addendum)
    Visual & Cognitive Activities:  1.  Simple word search 2.  Read simple paragraphs/bible verses out loud 3.  With someone with you, look for items in grocery store 4.  Work simple jigsaw puzzles 5.  Play simple matching games online/tablet that make you search visually or spread out cards on table to match. 6.  Play board/card games (memory/matching, solitaire, connect 4, etc.) 7.  Write grocery list with help 8.  Connect the dot activities 9.  Mazes 10.  Sorting laundry, coins, silverware  Activities for environmental (larger) scanning:   1. With supervision, scan for items in grocery store or drugstore.  Begin with a familiar store, then progress to a new store you've never been in before. Make sure you have supervision with this.  2. With supervision, tell a family member or caregiver when it is safe to cross a street after looking all directions and any side streets. However, do NOT cross street unless family member or caregiver is with you and says it is OK    Compensation strategies: 1. Look for the edge of objects (to the left and/or right) so that you make sure you are seeing all of an object 2. Turn your head when walking, scan from side to side, particularly in busy environments and have someone with you for safety. 3. Use an organized scanning pattern. It's usually easier to scan from top to bottom, and left to right (like you are reading) 4. Double check yourself 5. Use a line guide (like a blank piece of paper) or your finger when reading 6. If necessary, place brightly colored tape at end of table or work area as a reminder to always look until you see the tape.   7. Large print will be easier to see. 8. Organize items in baskets and/or with labels so they are easier to see/find 9. Remove clutter 10.  Make sure you have good lighting. 11.  Consider using contrast/brightly colored strips on steps to make it easier to see the edge.

## 2022-06-15 NOTE — Therapy (Signed)
OUTPATIENT OCCUPATIONAL THERAPY NEURO EVALUATION  Patient Name: Evan [name] MRN: [id number] DOB:08-10-1946, 76 y.o., male Today's Date: 06/16/2022  PCP: Josetta Huddle, MD REFERRING PROVIDER: Kerney Elbe, DO    OT End of Session - 06/16/22 0853     Visit Number 3    Number of Visits 9    Date for OT Re-Evaluation 08/03/22    Authorization Type [employer] 2023  covered @ 100%  $800 ded Met  $6500 OPM Met  VL: 60 (combined)  Auth Not Reqd.   [employer]  $20 copay  No Ded  $3400 OPM $0 Met  VL:MN    Authorization Time Period Approved 9 OT visits 05/05/22 - 08/03/22    Authorization - Visit Number 3    Authorization - Number of Visits 9    Progress Note Due on Visit 9    OT Start Time 0851    OT Stop Time 0933    OT Time Calculation (min) 42 min    Activity Tolerance Patient tolerated treatment well    Behavior During Therapy [facility] for tasks assessed/performed               Past Medical History:  Diagnosis Date   Acquired dilation of ascending aorta and aortic root ([place])    a.) measurements by TTE in 04/2021 --> root 40 mm and ascending aorta 43 mm   Anginal pain (HCC)    Anxiety    Atrial flutter ([place]) 04/22/2021   a.) CHADS2VASc = 5 (age x 2, HTN, T2DM, CAD); b.) daily warfarin   Carotid disease, bilateral ([place])    a.) Doppler in 2019 --> 1-39% stenosis on RIGHT and 40-58% on LEFT   Cataracts, bilateral    Chronic anticoagulation    Warfarin   Chronic renal insufficiency    Coronary artery disease    a.) s/p 3v CABG in 1999   Depression    GERD (gastroesophageal reflux disease)    History of kidney stones    Hypercholesteremia    Hypertension    Kidney stone    Low testosterone    on TRT injections   Malignant melanoma ([place])    a.) face, nose, left leg; b.) Tx'd with chemotherapy + XRT at [facility]   Myocardial infarction [facility])    Pulmonary emboli ([place])    S/P CABG x 3 1999   a.) LIMA-LAD, SVG-RCA, SVG-diagnonal   Sleep apnea    not using nocturnal PAP  therapy   T2DM (type 2 diabetes mellitus) ([place])    Past Surgical History:  Procedure Laterality Date   CARDIAC CATHETERIZATION     x3     last 1999   CARDIOVERSION N/A 08/12/2021   Procedure: CARDIOVERSION;  Surgeon: Fay Records, MD;  Location: [place];  Service: Cardiovascular;  Laterality: N/A;   CATARACT EXTRACTION Bilateral    COLONOSCOPY WITH PROPOFOL N/A 09/26/2014   Procedure: COLONOSCOPY WITH PROPOFOL;  Surgeon: [name], MD;  [facility];  Service: Endoscopy;  Laterality: N/A;   CORONARY ARTERY BYPASS GRAFT N/A 1999   3v; LIMA-LAD, SVG-RCA, SVG-diagonal   HOLEP-LASER ENUCLEATION OF THE PROSTATE WITH MORCELLATION N/A 06/07/2021   Procedure: HOLEP-LASER ENUCLEATION OF THE PROSTATE WITH MORCELLATION;  Surgeon: Billey Co, MD;  Location: [facility];  Service: Urology;  Laterality: N/A;   KNEE ARTHROSCOPY Left    LAPAROSCOPIC CHOLECYSTECTOMY     MASS EXCISION  04/15/2012   Procedure: EXCISION MASS;  Surgeon: [name], MD;  Location: [place];  Service: ENT;  Laterality: Left;  Left tonsil biopsy   melanomia     several skin ca removed-nose, left ankle, parotid gland left, right nodes-being followed annually..   Patient Active Problem List   Diagnosis Date Noted   SDH (subdural hematoma) ([place]) 04/11/2022   Fall (on)(from) sidewalk curb, initial encounter 04/11/2022   History of melanoma excision 04/11/2022   Diabetes mellitus type 2 in obese ([place]) 04/11/2022   DNR (do not resuscitate) 04/11/2022   Chronic anticoagulation 06/30/2021   Typical atrial flutter ([place]) 06/28/2021   Pure hypercholesterolemia 12/23/2013   Carotid artery disease ([place]) 12/23/2013   Essential hypertension, benign 12/23/2013   Angina decubitus ([place]) 12/23/2013   Coronary artery disease    Cancer ([place])    Depression    Pulmonary emboli ([place])    Hypercholesteremia    Kidney stone    Chronic renal insufficiency     ONSET DATE:  04/11/22  REFERRING DIAG: U88.5XAA (ICD-10-CM)  - Subdural hematoma (HCC) W19.Merril Abbe (ICD-10-CM) - Fall, initial encounter   THERAPY DIAG:  Visuospatial deficit  Unsteadiness on feet  Other symptoms and signs involving cognitive functions following other nontraumatic intracranial hemorrhage  Rationale for Evaluation and Treatment Rehabilitation  SUBJECTIVE:   SUBJECTIVE STATEMENT: "I forgot my reading glasses this morning"   Pt accompanied by: self  PERTINENT HISTORY:  76 y.o. male presents to [facility] hospital on 7/14 after falling. Pt found to have small parafalcine SDH and right tentorial SDH , reporting nausea, HA, and dizziness   Fell stepping on traffic cone at work.  Doctor said if I felt like I could drive, I could.  I drove today.  Pt reports that he has had some dizziness/"spinning" but not currently.    PMH:  includes anxiety, aflutter, depression, HTN, PE, CABG, DMII.   PRECAUTIONS: Fall  PAIN:  Are you having pain? No  FALLS: Has patient fallen in last 6 months? Yes. 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  OBJECTIVE:   VISION ASSESSMENT (from eval): Impaired Ocular ROM: WFL Tracking/Visual pursuits: Decreased smoothness with horizontal tracking and to the L, particularly with R eye, with L eye--mild decr smoothness with tracking superiorly Saccades: additional eye shifts occurred during testing and to occurred when looking to the L Convergence: WFL Visual Fields: pt appeared to demo decr awareness to L visual field when stimulus on both sides  simultaneously Environmental scanning with ambulating in min distracting environment with 10/15 items located on first pass ( 67%) and additional 3 located on 2nd pass, cueing needed for remaining 2 items.  3/5 items missed initially were located on L side.     TODAY'S TREATMENT:   Placing small pegs in pegboard to copy design for visual perceptual with incr time, but good accuracy.    Environmental Scanning/navigation (ambulating) in mod distracting environment with 14/15 accuracy = 93% on first pass. Pt needed mod cues and 3rd attempt to locate remaining item on L side.  Discussed errors and need to perform head turns to compensate for visual deficits.  Also discussed/noted that pt tends to get "wobbly" more  when looking to the L.  Constant therapy: Symbol Matching:  Level 6, 73% accuracy, with 101.85sec average response/completion time.     PATIENT EDUCATION: Education details: Memory compensation strategies, lumosity/constant therapy info Person educated: Patient Education method: Explanation and Handouts Education comprehension: verbalized understanding   HOME EXERCISE PROGRAM: 06/09/22:  Visual/Cognitive HEP; Visual Compensation strategies 06/16/22:  Memory Compensation Strategies, lumosity/constant therapy info    GOALS: Potential Goals reviewed with patient? Yes  SHORT TERM GOALS: Target date: 06/04/22  Pt will verbalize understanding of visual compensation strategies. Goal status: IN PROGRESS  2.  Pt will verbalize understanding of HEP for visual and cognitive deficits. Goal status: IN PROGRESS  3.  Pt will perform environmental scanning/navigation in min distracting environment with at least 90% accuracy for incr safety. Baseline:  67% Goal status: Ongoing, 06/16/22:  93% in mod distracting environment     LONG TERM GOALS: Target date: 08/03/22  Pt will perform environmental scanning/navigation in mod distracting environment with at least 90% accuracy for incr  safety. Goal status: INITIAL  2.  Pt will perform simple environmental scanning/navigation with at least 1 other cognitive/physical task with at least 85% accuracy. Goal status: INITIAL  3.  Pt will verbalize understanding of memory/cognitive compensation strategies for ADLs/IADLs. Goal status: INITIAL   ASSESSMENT:  CLINICAL IMPRESSION: Pt is progressing towards goals.  Pt with decr environmental scanning/missed items on L side but improved from last week.  PERFORMANCE DEFICITS in functional skills including IADLs, mobility, balance, decreased knowledge of precautions, vision, and vestibular, cognitive skills including attention, memory, and safety awareness, and psychosocial skills including environmental adaptation and habits.   IMPAIRMENTS are limiting patient from ADLs, IADLs, work, and leisure.   COMORBIDITIES may have co-morbidities  that affects occupational performance. Patient will benefit from skilled OT to address above impairments and improve overall function.  MODIFICATION OR ASSISTANCE TO COMPLETE EVALUATION: Min-Moderate modification of tasks or assist with assess necessary to complete an evaluation.  OT OCCUPATIONAL PROFILE AND HISTORY: Detailed assessment: Review of records and additional review of physical, cognitive, psychosocial history related to current functional performance.  CLINICAL DECISION MAKING: Moderate - several treatment options, min-mod task modification necessary  REHAB POTENTIAL: Good  EVALUATION COMPLEXITY: Moderate   PLAN: OT FREQUENCY: 1x/week  OT DURATION: other: for 9 visits over 12 weeks (due to scheduling needs/concerns).  PLANNED INTERVENTIONS: self care/ADL training, therapeutic exercise, therapeutic activity, balance training, functional mobility training, aquatic therapy, patient/family education, cognitive remediation/compensation, visual/perceptual remediation/compensation, and DME and/or AE instructions  RECOMMENDED OTHER  SERVICES: current with PT  CONSULTED AND AGREED WITH PLAN OF CARE: Patient  PLAN FOR NEXT SESSION:  constant therapy, review visual/memory compensation strategies prn, continue with environmental scanning with emphasis on head turns   [facility], OTR/L 06/16/2022, 9:47 AM

## 2022-06-16 ENCOUNTER — Encounter: Payer: Self-pay | Admitting: Occupational Therapy

## 2022-06-16 ENCOUNTER — Ambulatory Visit: Payer: Worker's Compensation | Admitting: Occupational Therapy

## 2022-06-16 ENCOUNTER — Ambulatory Visit: Payer: Worker's Compensation | Admitting: Physical Therapy

## 2022-06-16 ENCOUNTER — Encounter: Payer: Self-pay | Admitting: Physical Therapy

## 2022-06-16 DIAGNOSIS — R41842 Visuospatial deficit: Secondary | ICD-10-CM

## 2022-06-16 DIAGNOSIS — I69218 Other symptoms and signs involving cognitive functions following other nontraumatic intracranial hemorrhage: Secondary | ICD-10-CM

## 2022-06-16 DIAGNOSIS — R2681 Unsteadiness on feet: Secondary | ICD-10-CM

## 2022-06-16 NOTE — Patient Instructions (Signed)
Memory Compensation Strategies  Use "WARM" strategy. W= write it down A=  associate it R=  repeat it M=  make a mental picture  You can keep a Memory Notebook. Use a 3-ring notebook with sections for the following:  calendar, important names and phone numbers, medications, doctors' names/phone numbers, "to do list"/reminders, and a section to journal what you did each day  Use a calendar to write appointments down.  Write yourself a schedule for the day/make a routine This can be placed on the calendar or in a separate section of the Memory Notebook.  Keeping a regular schedule can help memory.  Use medication organizer with sections for each day or morning/evening pills  You may need help loading it  Keep a basket, or pegboard by the door.   Place items that you need to take out with you in the basket or on the pegboard.  You may also want to include a message board for reminders.  Use sticky notes. Place sticky notes with reminders in a place where the task is performed.  For example:  "turn off the stove" placed by the stove, "lock the door" placed on the door at eye level, "take your medications" on the bathroom mirror or by the place where you normally take your medications  Use alarms/timers.  Use while cooking to remind yourself to check on food or as a reminder to take your medicine, or as a reminder to make a call, or as a reminder to perform another task, etc.  Use a voice memo to record important information and notes for yourself.  10.  Organize and reduce clutter

## 2022-06-16 NOTE — Therapy (Signed)
OUTPATIENT PHYSICAL THERAPY NEURO TREATMENT NOTE   Patient Name: Evan [name] MRN: [id number] DOB:19-Jan-1946, 76 y.o., male Today's Date: 06/16/2022   PCP: Josetta Huddle, MD  REFERRING PROVIDER: Kerney Elbe, DO    PT End of Session - 06/16/22 0857     Visit Number 5    Number of Visits 7   Plus eval   Date for PT Re-Evaluation 06/23/22    Authorization Type [employer]    Authorization Time Period 05-05-22 - 06-23-22    Authorization - Visit Number 5    Authorization - Number of Visits 7    PT Start Time 0758    PT Stop Time 0845    PT Time Calculation (min) 47 min    Equipment Utilized During Treatment Gait belt    Activity Tolerance Patient tolerated treatment well    Behavior During Therapy [facility] for tasks assessed/performed                 Past Medical History:  Diagnosis Date   Acquired dilation of ascending aorta and aortic root ([place])    a.) measurements by TTE in 04/2021 --> root 40 mm and ascending aorta 43 mm   Anginal pain ([place])    Anxiety    Atrial flutter ([place]) 04/22/2021   a.) CHADS2VASc = 5 (age x 2, HTN, T2DM, CAD); b.) daily warfarin   Carotid disease, bilateral ([place])    a.) Doppler in 2019 --> 1-39% stenosis on RIGHT and 40-58% on LEFT   Cataracts, bilateral    Chronic anticoagulation    Warfarin   Chronic renal insufficiency    Coronary artery disease    a.) s/p 3v CABG in 1999   Depression    GERD (gastroesophageal reflux disease)    History of kidney stones    Hypercholesteremia    Hypertension    Kidney stone    Low testosterone    on TRT injections   Malignant melanoma ([place])    a.) face, nose, left leg; b.) Tx'd with chemotherapy + XRT at [facility]   Myocardial infarction [facility])    Pulmonary emboli ([place])    S/P CABG x 3 1999   a.) LIMA-LAD, SVG-RCA, SVG-diagnonal   Sleep apnea    not using nocturnal PAP therapy   T2DM (type 2 diabetes mellitus) ([place])    Past Surgical History:  Procedure Laterality Date   CARDIAC  CATHETERIZATION     x3     last 1999   CARDIOVERSION N/A 08/12/2021   Procedure: CARDIOVERSION;  Surgeon: Fay Records, MD;  Location: [place];  Service: Cardiovascular;  Laterality: N/A;   CATARACT EXTRACTION Bilateral    COLONOSCOPY WITH PROPOFOL N/A 09/26/2014   Procedure: COLONOSCOPY WITH PROPOFOL;  Surgeon: [name], MD;  [facility];  Service: Endoscopy;  Laterality: N/A;   CORONARY ARTERY BYPASS GRAFT N/A 1999   3v; LIMA-LAD, SVG-RCA, SVG-diagonal   HOLEP-LASER ENUCLEATION OF THE PROSTATE WITH MORCELLATION N/A 06/07/2021   Procedure: HOLEP-LASER ENUCLEATION OF THE PROSTATE WITH MORCELLATION;  Surgeon: Billey Co, MD;  Location: [facility];  Service: Urology;  Laterality: N/A;   KNEE ARTHROSCOPY Left    LAPAROSCOPIC CHOLECYSTECTOMY     MASS EXCISION  04/15/2012   Procedure: EXCISION MASS;  Surgeon: [name], MD;  Location: [place];  Service: ENT;  Laterality: Left;  Left tonsil biopsy   melanomia     several skin ca removed-nose, left ankle, parotid gland left, right nodes-being followed annually..   Patient  Active Problem List   Diagnosis Date Noted   SDH (subdural hematoma) ([place]) 04/11/2022   Fall (on)(from) sidewalk curb, initial encounter 04/11/2022   History of melanoma excision 04/11/2022   Diabetes mellitus type 2 in obese ([place]) 04/11/2022   DNR (do not resuscitate) 04/11/2022   Chronic anticoagulation 06/30/2021   Typical atrial flutter ([place]) 06/28/2021   Pure hypercholesterolemia 12/23/2013   Carotid artery disease ([place]) 12/23/2013   Essential hypertension, benign 12/23/2013   Angina decubitus ([place]) 12/23/2013   Coronary artery disease    Cancer ([place])    Depression    Pulmonary emboli ([place])    Hypercholesteremia    Kidney stone    Chronic renal insufficiency     ONSET DATE: 04/12/2022   REFERRING DIAG: Z16.5XAA (ICD-10-CM) - Subdural hematoma (HCC) W19.Merril Abbe (ICD-10-CM) - Fall, initial encounter   THERAPY DIAG:  Unsteadiness on  feet  Rationale for Evaluation and Treatment Rehabilitation  SUBJECTIVE:                                                                                                                                                                                              SUBJECTIVE STATEMENT: Pt reports he has had a good week - didn't feel too well yesterday but feels better today; states vertigo is still resolved - has not been dizzy in past several weeks   Pt accompanied by: self  PERTINENT HISTORY: anxiety, aflutter, depression, HTN, PE, CABG, DMII. Presented to [facility] hospital on 7/14 after falling. Pt found to have small parafalcine SDH and right tentorial SDH , reporting nausea, HA, and dizziness   PAIN:  Are you having pain? No  PRECAUTIONS: Fall and HOH  WEIGHT BEARING RESTRICTIONS No  FALLS: Has patient fallen in last 6 months? Yes. Number of falls "I mean I stumble every now and again but that is because I am an old man". Pt reports he has fallen out of bed twice, one 2-3 weeks ago and another a few months ago, hit head both times. One fall on 7/14 when attempting to back up and fell posteriorly onto L occiput and had LOC  LIVING ENVIRONMENT: Lives with: lives with their spouse and 2 dogs Lives in: House/apartment Stairs: Yes: External: 2 steps; none Has following equipment at home: Lobbyist, Environmental consultant - 2 wheeled, Environmental consultant - 4 wheeled, shower chair, Grab bars, and Ramped entry  PLOF: Independent  PATIENT GOALS "I wanna be ambulatory where people don't have to worry about me falling" "I wanna get back to work- it's fun"    OBJECTIVE:     TherEx:  5x sit to stand  from mat table without UE support with SBA - feet on Airex for challenge with balance   Leg press 70#  4 sets 10 reps bil. LE's for bil. LE strengthening  Standing hip flexion, extension, abduction with red theraband on each leg - 10 reps each  Heel raises bil. LE's 10 reps with UE support  Runner's stretch  each leg at end of session - 20 sec hold x 1 rep each   NeuroRe-ed:  Pt performed marching on Airex 5 reps each leg with CGA   Rockerboard EO 10 reps with bil. UE support: 1 UE support with EO 10 reps:  10 reps without UE support with CGA to min assist for recovery of LOB; standing on rockerboard - attempting to keep board steady - head turns with EO horizontal 5 reps, vertical 5 reps with CGA - no UE support used   Stepping over/back of balance beam 5 reps each leg standing on floor; then stepping sideways each leg 5 reps without UE support  Pt amb. 40' x 1 rep with horizontal head turns; 40' x 1 rep with vertical head turns with SBA - pt reported no dizziness with amb. With either direction  Pt performed standing alternating forward, back and side kicks on blue mat for improved SLS on compliant surface - 10 reps each leg each direction   Obstacle course - stepping over black balance beam, walking forward on blue mat, stepping down to floor and then stepping up with both feet onto 6" step with CGA - pt performed this activity 4 reps - to improve SLS on each leg  Pt performed amb. With EC with SBA 25' forward x 2 reps to increase vestibular input in maintaining balance   PATIENT EDUCATION: Education details:  recommended pt start walking program at home for strengthening and endurance - instructed him to use walking stick for safety and assist with balance prn Person educated: Patient Education method: Explanation and Demonstration Education comprehension: verbalized understanding   HOME EXERCISE PROGRAM: To be established     GOALS: Goals reviewed with patient? Yes  SHORT TERM GOALS: Target date: 05/26/2022  Further vestibular assessment to be performed and STG/LTGs to be established by vestibular therapist as appropriate Baseline: Goal status: Goal met 05-27-22    LONG TERM GOALS: Target date: 06/16/2022  See STGs  Baseline:  Goal status:  INITIAL  ASSESSMENT:  CLINICAL IMPRESSION:   PT session focused on balance exercises with focus on increased vestibular input and on SLS; pt had mild LOB with SLS balance activities on noncompliant surfaces and moderate LOB with SLS activities on compliant surfaces.  Pt tolerated PRE's well with minimal c/o fatigue at end of session.  Balance is improving with high level balance skills, specifically SLS, being most challenging for patient.  BPPV remains resolved.  Cont with POC - plan D/C next session.     OBJECTIVE IMPAIRMENTS Abnormal gait, decreased cognition, decreased coordination, decreased knowledge of condition, difficulty walking, decreased safety awareness, and dizziness.   ACTIVITY LIMITATIONS bending, squatting, stairs, bed mobility, and reach over head  PARTICIPATION LIMITATIONS: driving, community activity, and occupation  PERSONAL FACTORS Age, Fitness, Past/current experiences, Transportation, and 1 comorbidity: subdural hematoma  are also affecting patient's functional outcome.   REHAB POTENTIAL: Good  CLINICAL DECISION MAKING: Stable/uncomplicated  EVALUATION COMPLEXITY: Low  PLAN: PT FREQUENCY: 1x/week  PT DURATION: 6 weeks  PLANNED INTERVENTIONS: Therapeutic exercises, Therapeutic activity, Neuromuscular re-education, Balance training, Gait training, Patient/Family education, Self Care, Vestibular training, Canalith repositioning,  Visual/preceptual remediation/compensation, DME instructions, Aquatic Therapy, Dry Needling, Manual therapy, and Re-evaluation  PLAN FOR NEXT SESSION: bil. LE strengthening and endurance training   Sehar Sedano, [place], PT 06/16/2022, 9:01 AM

## 2022-06-22 NOTE — Therapy (Signed)
OUTPATIENT OCCUPATIONAL THERAPY NEURO EVALUATION  Patient Name: Evan [name] MRN: [id number] DOB:[birth year], 76 y.o., male Today's Date: 06/23/2022  PCP: Josetta Huddle, MD REFERRING PROVIDER: Kerney Elbe, DO    OT End of Session - 06/23/22 0757     Visit Number 4    Number of Visits 9    Date for OT Re-Evaluation 08/03/22    Authorization Type [employer] 2023  covered @ 100%  $800 ded Met  $6500 OPM Met  VL: 60 (combined)  Auth Not Reqd.   [employer]  $20 copay  No Ded  $3400 OPM $0 Met  VL:MN    Authorization Time Period Approved 9 OT visits 05/05/22 - 08/03/22    Authorization - Visit Number 4    Authorization - Number of Visits 9    Progress Note Due on Visit 9    OT Start Time 0803    OT Stop Time 0845    OT Time Calculation (min) 42 min    Activity Tolerance Patient tolerated treatment well    Behavior During Therapy [facility] for tasks assessed/performed                Past Medical History:  Diagnosis Date   Acquired dilation of ascending aorta and aortic root ([place])    a.) measurements by TTE in 04/2021 --> root 40 mm and ascending aorta 43 mm   Anginal pain (HCC)    Anxiety    Atrial flutter ([place]) 04/22/2021   a.) CHADS2VASc = 5 (age x 2, HTN, T2DM, CAD); b.) daily warfarin   Carotid disease, bilateral ([place])    a.) Doppler in 2019 --> 1-39% stenosis on RIGHT and 40-58% on LEFT   Cataracts, bilateral    Chronic anticoagulation    Warfarin   Chronic renal insufficiency    Coronary artery disease    a.) s/p 3v CABG in 1999   Depression    GERD (gastroesophageal reflux disease)    History of kidney stones    Hypercholesteremia    Hypertension    Kidney stone    Low testosterone    on TRT injections   Malignant melanoma ([place])    a.) face, nose, left leg; b.) Tx'd with chemotherapy + XRT at [facility]   Myocardial infarction [facility])    Pulmonary emboli ([place])    S/P CABG x 3 1999   a.) LIMA-LAD, SVG-RCA, SVG-diagnonal   Sleep apnea    not using nocturnal  PAP therapy   T2DM (type 2 diabetes mellitus) ([place])    Past Surgical History:  Procedure Laterality Date   CARDIAC CATHETERIZATION     x3     last 1999   CARDIOVERSION N/A 08/12/2021   Procedure: CARDIOVERSION;  Surgeon: Fay Records, MD;  Location: [place];  Service: Cardiovascular;  Laterality: N/A;   CATARACT EXTRACTION Bilateral    COLONOSCOPY WITH PROPOFOL N/A 09/26/2014   Procedure: COLONOSCOPY WITH PROPOFOL;  Surgeon: [name], MD;  [facility];  Service: Endoscopy;  Laterality: N/A;   CORONARY ARTERY BYPASS GRAFT N/A 1999   3v; LIMA-LAD, SVG-RCA, SVG-diagonal   HOLEP-LASER ENUCLEATION OF THE PROSTATE WITH MORCELLATION N/A 06/07/2021   Procedure: HOLEP-LASER ENUCLEATION OF THE PROSTATE WITH MORCELLATION;  Surgeon: Billey Co, MD;  Location: [facility];  Service: Urology;  Laterality: N/A;   KNEE ARTHROSCOPY Left    LAPAROSCOPIC CHOLECYSTECTOMY     MASS EXCISION  04/15/2012   Procedure: EXCISION MASS;  Surgeon: [name], MD;  Location: [place];  Service: ENT;  Laterality: Left;  Left tonsil biopsy   melanomia     several skin ca removed-nose, left ankle, parotid gland left, right nodes-being followed annually..   Patient Active Problem List   Diagnosis Date Noted   SDH (subdural hematoma) ([place]) 04/11/2022   Fall (on)(from) sidewalk curb, initial encounter 04/11/2022   History of melanoma excision 04/11/2022   Diabetes mellitus type 2 in obese ([place]) 04/11/2022   DNR (do not resuscitate) 04/11/2022   Chronic anticoagulation 06/30/2021   Typical atrial flutter ([place]) 06/28/2021   Pure hypercholesterolemia 12/23/2013   Carotid artery disease ([place]) 12/23/2013   Essential hypertension, benign 12/23/2013   Angina decubitus ([place]) 12/23/2013   Coronary artery disease    Cancer ([place])    Depression    Pulmonary emboli ([place])    Hypercholesteremia    Kidney stone    Chronic renal insufficiency     ONSET DATE:  04/11/22  REFERRING DIAG: H03.5XAA  (ICD-10-CM) - Subdural hematoma (HCC) W19.Merril Abbe (ICD-10-CM) - Fall, initial encounter   THERAPY DIAG:  Visuospatial deficit  Other symptoms and signs involving cognitive functions following other nontraumatic intracranial hemorrhage  Unsteadiness on feet  Rationale for Evaluation and Treatment Rehabilitation  SUBJECTIVE:   SUBJECTIVE STATEMENT: "I forgot my reading glasses again this morning"   Pt accompanied by: self  PERTINENT HISTORY:  76 y.o. male [facility] to [facility] hospital on 7/14 after falling. Pt found to have small parafalcine SDH and right tentorial SDH , reporting nausea, HA, and dizziness   Fell stepping on traffic cone at work.  Doctor said if I felt like I could drive, I could.  I drove today.  Pt reports that he has had some dizziness/"spinning" but not currently.    PMH:  includes anxiety, aflutter, depression, HTN, PE, CABG, DMII.   PRECAUTIONS: Fall  PAIN:  Are you having pain? No  FALLS: Has patient fallen in last 6 months? Yes. 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  OBJECTIVE:   VISION ASSESSMENT (from eval): Impaired Ocular ROM: WFL Tracking/Visual pursuits: Decreased smoothness with horizontal tracking and to the L, particularly with R eye, with L eye--mild decr smoothness with tracking superiorly Saccades: additional eye shifts occurred during testing and to occurred when looking to the L Convergence: WFL Visual Fields: pt appeared to demo decr awareness to L visual field when stimulus on both  sides simultaneously Environmental scanning with ambulating in min distracting environment with 10/15 items located on first pass ( 67%) and additional 3 located on 2nd pass, cueing needed for remaining 2 items.  3/5 items missed initially were located on L side.     TODAY'S TREATMENT:   Copying PVC design for visual scanning and problem-solving with mod cueing/difficulty for copying design and scanning and orientation of pieces.  Environmental Scanning/navigation (ambulating) in mod distracting environment with 12/15 accuracy = 80% on first pass. Pt able to find 2 additional on 2nd pass and needed min cueing for remaining item (missed items on both sides).   Matching digital clock times with clock faces for visual scanning and incr cognitive  skills with mod cueing initially, then min cueing/incr time.  Unable to complete due to time constraints    HOME EXERCISE PROGRAM: 06/09/22:  Visual/Cognitive HEP; Visual Compensation strategies 06/16/22:  Memory Compensation Strategies, lumosity/constant therapy info    GOALS: Potential Goals reviewed with patient? Yes  SHORT TERM GOALS: Target date: 06/04/22  Pt will verbalize understanding of visual compensation strategies. Goal status: IN PROGRESS  2.  Pt will verbalize understanding of HEP for visual and cognitive deficits. Goal status: IN PROGRESS  3.  Pt will perform environmental scanning/navigation in min distracting environment with at least 90% accuracy for incr safety. Baseline:  67% Goal status: MET, 06/16/22:  93% in mod distracting environment.  06/23/22:  80%    LONG TERM GOALS: Target date: 08/03/22  Pt will perform environmental scanning/navigation in mod distracting environment with at least 90% accuracy for incr safety. Goal status: IN PROGRESS  2.  Pt will perform simple environmental scanning/navigation with at least 1 other cognitive/physical task with at least 85% accuracy. Goal status: INITIAL  3.  Pt will  verbalize understanding of memory/cognitive compensation strategies for ADLs/IADLs. Goal status: INITIAL   ASSESSMENT:  CLINICAL IMPRESSION: Pt is progressing towards goals with improving balance and visual scanning, but cognitive deficits remain a barrier.  PERFORMANCE DEFICITS in functional skills including IADLs, mobility, balance, decreased knowledge of precautions, vision, and vestibular, cognitive skills including attention, memory, and safety awareness, and psychosocial skills including environmental adaptation and habits.   IMPAIRMENTS are limiting patient from ADLs, IADLs, work, and leisure.   COMORBIDITIES may have co-morbidities  that affects occupational performance. Patient will benefit from skilled OT to address above impairments and improve overall function.  MODIFICATION OR ASSISTANCE TO COMPLETE EVALUATION: Min-Moderate modification of tasks or assist with assess necessary to complete an evaluation.  OT OCCUPATIONAL PROFILE AND HISTORY: Detailed assessment: Review of records and additional review of physical, cognitive, psychosocial history related to current functional performance.  CLINICAL DECISION MAKING: Moderate - several treatment options, min-mod task modification necessary  REHAB POTENTIAL: Good  EVALUATION COMPLEXITY: Moderate   PLAN: OT FREQUENCY: 1x/week  OT DURATION: other: for 9 visits over 12 weeks (due to scheduling needs/concerns).  PLANNED INTERVENTIONS: self care/ADL training, therapeutic exercise, therapeutic activity, balance training, functional mobility training, aquatic therapy, patient/family education, cognitive remediation/compensation, visual/perceptual remediation/compensation, and DME and/or AE instructions  RECOMMENDED OTHER SERVICES: current with PT  CONSULTED AND AGREED WITH PLAN OF CARE: Patient  PLAN FOR NEXT SESSION:  constant therapy, review visual/memory compensation strategies prn, continue with environmental scanning with  emphasis on head turns (scheduled through 10/16)   Evan [name], OTR/L 06/23/2022, 8:22 AM

## 2022-06-23 ENCOUNTER — Encounter: Payer: Self-pay | Admitting: Occupational Therapy

## 2022-06-23 ENCOUNTER — Ambulatory Visit: Payer: Worker's Compensation | Admitting: Physical Therapy

## 2022-06-23 ENCOUNTER — Ambulatory Visit: Payer: Worker's Compensation | Admitting: Occupational Therapy

## 2022-06-23 ENCOUNTER — Encounter: Payer: Self-pay | Admitting: Physical Therapy

## 2022-06-23 DIAGNOSIS — R41842 Visuospatial deficit: Secondary | ICD-10-CM

## 2022-06-23 DIAGNOSIS — R2681 Unsteadiness on feet: Secondary | ICD-10-CM

## 2022-06-23 DIAGNOSIS — I69218 Other symptoms and signs involving cognitive functions following other nontraumatic intracranial hemorrhage: Secondary | ICD-10-CM

## 2022-06-23 NOTE — Therapy (Signed)
OUTPATIENT PHYSICAL THERAPY NEURO TREATMENT NOTE/DISCHARGE SUMMARY   Patient Name: Evan [name] MRN: [id number] DOB:Oct 03, 1945, 76 y.o., male Today's Date: 06/23/2022   PCP: Josetta Huddle, MD  REFERRING PROVIDER: Kerney Elbe, DO    PT End of Session - 06/23/22 2045     Visit Number 6    Number of Visits 7   Plus eval   Date for PT Re-Evaluation 06/23/22    Authorization Type [employer]    Authorization Time Period 05-05-22 - 06-23-22    Authorization - Visit Number 6    Authorization - Number of Visits 7    PT Start Time 0850    PT Stop Time 0930    PT Time Calculation (min) 40 min    Activity Tolerance Patient tolerated treatment well    Behavior During Therapy [facility] for tasks assessed/performed                  Past Medical History:  Diagnosis Date   Acquired dilation of ascending aorta and aortic root ([place])    a.) measurements by TTE in 04/2021 --> root 40 mm and ascending aorta 43 mm   Anginal pain ([place])    Anxiety    Atrial flutter ([place]) 04/22/2021   a.) CHADS2VASc = 5 (age x 2, HTN, T2DM, CAD); b.) daily warfarin   Carotid disease, bilateral ([place])    a.) Doppler in 2019 --> 1-39% stenosis on RIGHT and 40-58% on LEFT   Cataracts, bilateral    Chronic anticoagulation    Warfarin   Chronic renal insufficiency    Coronary artery disease    a.) s/p 3v CABG in 1999   Depression    GERD (gastroesophageal reflux disease)    History of kidney stones    Hypercholesteremia    Hypertension    Kidney stone    Low testosterone    on TRT injections   Malignant melanoma ([place])    a.) face, nose, left leg; b.) Tx'd with [facility] + [facility] at [facility]   Myocardial infarction [facility])    Pulmonary emboli ([place])    S/P CABG x 3 1999   a.) LIMA-LAD, SVG-RCA, SVG-diagnonal   Sleep apnea    not using nocturnal PAP therapy   T2DM (type 2 diabetes mellitus) ([place])    Past Surgical History:  Procedure Laterality Date   CARDIAC CATHETERIZATION     x3      last 1999   CARDIOVERSION N/A 08/12/2021   Procedure: CARDIOVERSION;  Surgeon: Fay Records, MD;  Location: [place];  Service: Cardiovascular;  Laterality: N/A;   CATARACT EXTRACTION Bilateral    COLONOSCOPY WITH PROPOFOL N/A 09/26/2014   Procedure: COLONOSCOPY WITH PROPOFOL;  Surgeon: [name], MD;  [facility];  Service: Endoscopy;  Laterality: N/A;   CORONARY ARTERY BYPASS GRAFT N/A 1999   3v; LIMA-LAD, SVG-RCA, SVG-diagonal   HOLEP-LASER ENUCLEATION OF THE PROSTATE WITH MORCELLATION N/A 06/07/2021   Procedure: HOLEP-LASER ENUCLEATION OF THE PROSTATE WITH MORCELLATION;  Surgeon: Billey Co, MD;  Location: [facility];  Service: Urology;  Laterality: N/A;   KNEE ARTHROSCOPY Left    LAPAROSCOPIC CHOLECYSTECTOMY     MASS EXCISION  04/15/2012   Procedure: EXCISION MASS;  Surgeon: [name], MD;  Location: [place];  Service: ENT;  Laterality: Left;  Left tonsil biopsy   melanomia     several skin ca removed-nose, left ankle, parotid gland left, right nodes-being followed annually..   Patient Active Problem List   Diagnosis Date  Noted   SDH (subdural hematoma) ([place]) 04/11/2022   Fall (on)(from) sidewalk curb, initial encounter 04/11/2022   History of melanoma excision 04/11/2022   Diabetes mellitus type 2 in obese ([place]) 04/11/2022   DNR (do not resuscitate) 04/11/2022   Chronic anticoagulation 06/30/2021   Typical atrial flutter ([place]) 06/28/2021   Pure hypercholesterolemia 12/23/2013   Carotid artery disease ([place]) 12/23/2013   Essential hypertension, benign 12/23/2013   Angina decubitus ([place]) 12/23/2013   Coronary artery disease    Cancer ([place])    Depression    Pulmonary emboli ([place])    Hypercholesteremia    Kidney stone    Chronic renal insufficiency     ONSET DATE: 04/12/2022   REFERRING DIAG: H47.5XAA (ICD-10-CM) - Subdural hematoma (HCC) W19.Merril Abbe (ICD-10-CM) - Fall, initial encounter   THERAPY DIAG:  Unsteadiness on feet  Rationale for  Evaluation and Treatment Rehabilitation  SUBJECTIVE:                                                                                                                                                                                              SUBJECTIVE STATEMENT: Pt reports he is doing well with balance and walking; states he worked in the yard yesterday and did not have any problems.  States vertigo is still resolved. Feels ready for discharge from PT   Pt accompanied by: self  PERTINENT HISTORY: anxiety, aflutter, depression, HTN, PE, CABG, DMII. Presented to [facility] hospital on 7/14 after falling. Pt found to have small parafalcine SDH and right tentorial SDH , reporting nausea, HA, and dizziness   PAIN:  Are you having pain? No  PRECAUTIONS: Fall and HOH  WEIGHT BEARING RESTRICTIONS No  FALLS: Has patient fallen in last 6 months? Yes. Number of falls "I mean I stumble every now and again but that is because I am an old man". Pt reports he has fallen out of bed twice, one 2-3 weeks ago and another a few months ago, hit head both times. One fall on 7/14 when attempting to back up and fell posteriorly onto L occiput and had LOC  LIVING ENVIRONMENT: Lives with: lives with their spouse and 2 dogs Lives in: House/apartment Stairs: Yes: External: 2 steps; none Has following equipment at home: Lobbyist, Environmental consultant - 2 wheeled, Environmental consultant - 4 wheeled, shower chair, Grab bars, and Ramped entry  PLOF: Independent  PATIENT GOALS "I wanna be ambulatory where people don't have to worry about me falling" "I wanna get back to work- it's fun"    OBJECTIVE:     TherEx:  Leg press 70#  3 sets 10 reps  bil. LE's for bil. LE strengthening  Standing hip flexion, extension, abduction with red theraband on each leg - 10 reps each  Heel raises bil. LE's 10 reps with UE support  Step ups RLE and LLE 10 reps each leg onto 6" step with minimal UE support on hand rails   NeuroRe-ed:  TUG 14.81  secs   Tap ups to 1st step 5 reps each without UE support; tap ups to 2nd step without UE support 5 reps each alternating legs  Pt performed marching on Airex 5 reps each leg with CGA   Rockerboard EO 10 reps with bil. UE support: 1 UE support with EO 10 reps:  10 reps without UE support with CGA to min assist for recovery of LOB; standing on rockerboard - attempting to keep board steady - head turns with EO horizontal 5 reps, vertical 5 reps with CGA - no UE support used with CGA to min assist prn for balance recovery  Cone taps to 2 cones while standing on rockerboard  with 2 finger UE support on each // bar with CGA  Pt amb. 30' x 1 rep with horizontal head turns; 30' x 1 rep with vertical head turns with SBA - pt reported no dizziness with amb. With either direction  Pt performed standing alternating forward, back and side kicks on blue mat for improved SLS on compliant surface - 10 reps each leg each direction    PATIENT EDUCATION: Education details:  recommended pt start walking program at home for strengthening and endurance - instructed him to use walking stick for safety and assist with balance prn Person educated: Patient Education method: Customer service manager Education comprehension: verbalized understanding   HOME EXERCISE PROGRAM: To be established     GOALS: Goals reviewed with patient? Yes  SHORT TERM GOALS: Target date: 05/26/2022  Further vestibular assessment to be performed and STG/LTGs to be established by vestibular therapist as appropriate Baseline: Goal status: Goal met 05-27-22    LONG TERM GOALS: Target date: 06/16/2022  See STGs  Baseline:  Goal status: Goal met 06-23-22  ASSESSMENT:  CLINICAL IMPRESSION:   PT session focused on balance exercises on compliant surfaces for increased vestibular input and also on SLS activities.  Pt continues to report no dizziness (BPPV remains resolved); gait and balance are WNL's.  All LTG's met - pt is  discharged due to completion of program.      OBJECTIVE IMPAIRMENTS Abnormal gait, decreased cognition, decreased coordination, decreased knowledge of condition, difficulty walking, decreased safety awareness, and dizziness.   ACTIVITY LIMITATIONS bending, squatting, stairs, bed mobility, and reach over head  PARTICIPATION LIMITATIONS: driving, community activity, and occupation  PERSONAL FACTORS Age, Fitness, Past/current experiences, Transportation, and 1 comorbidity: subdural hematoma  are also affecting patient's functional outcome.   REHAB POTENTIAL: Good  CLINICAL DECISION MAKING: Stable/uncomplicated  EVALUATION COMPLEXITY: Low  PLAN: PT FREQUENCY: 1x/week  PT DURATION: 6 weeks  PLANNED INTERVENTIONS: Therapeutic exercises, Therapeutic activity, Neuromuscular re-education, Balance training, Gait training, Patient/Family education, Self Care, Vestibular training, Canalith repositioning, Visual/preceptual remediation/compensation, DME instructions, Aquatic Therapy, Dry Needling, Manual therapy, and Re-evaluation  PLAN FOR NEXT SESSION: D/C on 06-23-22    PHYSICAL THERAPY DISCHARGE SUMMARY  Visits from Start of Care: 6  Current functional level related to goals / functional outcomes: See above for progress towards goals - pt reports no vertigo at this time due to resolution of BPPV   Remaining deficits: Decreased high level balance deficits (SLS) but overall gait and balance  are WFL's   Education / Equipment: Pt has been instructed in HEP for balance and LE strengthening   Patient agrees to discharge. Patient goals were met. Patient is being discharged due to meeting the stated rehab goals.    Alda Lea, PT 06/23/2022, 8:49 PM

## 2022-06-30 ENCOUNTER — Encounter: Payer: Self-pay | Admitting: Occupational Therapy

## 2022-06-30 ENCOUNTER — Ambulatory Visit: Payer: Worker's Compensation | Attending: Internal Medicine | Admitting: Occupational Therapy

## 2022-06-30 ENCOUNTER — Ambulatory Visit: Payer: Worker's Compensation | Admitting: Physical Therapy

## 2022-06-30 DIAGNOSIS — R2681 Unsteadiness on feet: Secondary | ICD-10-CM | POA: Insufficient documentation

## 2022-06-30 DIAGNOSIS — I69218 Other symptoms and signs involving cognitive functions following other nontraumatic intracranial hemorrhage: Secondary | ICD-10-CM

## 2022-06-30 DIAGNOSIS — R41842 Visuospatial deficit: Secondary | ICD-10-CM

## 2022-06-30 NOTE — Therapy (Signed)
OUTPATIENT OCCUPATIONAL THERAPY NEURO TREATMENT  Patient Name: MAXXIMUS GOTAY MRN: [id number] DOB:03-Jun-1946, 76 y.o., male Today's Date: 06/30/2022  PCP: Josetta Huddle, MD REFERRING PROVIDER: Kerney Elbe, DO    OT End of Session - 06/30/22 [phone]     Visit Number 5    Number of Visits 9    Date for OT Re-Evaluation 08/03/22    Authorization Type Cigna 2023  covered @ 100%  $800 ded Met  $6500 OPM Met  VL: 60 (combined)  Auth Not Reqd.   [employer]  $20 copay  No Ded  $3400 OPM $0 Met  VL:MN    Authorization Time Period Approved 9 OT visits 05/05/22 - 08/03/22    Authorization - Visit Number 5    Authorization - Number of Visits 9    Progress Note Due on Visit 9    OT Start Time 0927    OT Stop Time 1012    OT Time Calculation (min) 45 min    Activity Tolerance Patient tolerated treatment well    Behavior During Therapy [facility] for tasks assessed/performed                Past Medical History:  Diagnosis Date   Acquired dilation of ascending aorta and aortic root ([place])    a.) measurements by TTE in 04/2021 --> root 40 mm and ascending aorta 43 mm   Anginal pain (HCC)    Anxiety    Atrial flutter ([place]) 04/22/2021   a.) CHADS2VASc = 5 (age x 2, HTN, T2DM, CAD); b.) daily warfarin   Carotid disease, bilateral ([place])    a.) Doppler in 2019 --> 1-39% stenosis on RIGHT and 40-58% on LEFT   Cataracts, bilateral    Chronic anticoagulation    Warfarin   Chronic renal insufficiency    Coronary artery disease    a.) s/p 3v CABG in 1999   Depression    GERD (gastroesophageal reflux disease)    History of kidney stones    Hypercholesteremia    Hypertension    Kidney stone    Low testosterone    on TRT injections   Malignant melanoma ([place])    a.) face, nose, left leg; b.) Tx'd with chemotherapy + XRT at [facility]   Myocardial infarction [facility])    Pulmonary emboli ([place])    S/P CABG x 3 1999   a.) LIMA-LAD, SVG-RCA, SVG-diagnonal   Sleep apnea    not using nocturnal PAP  therapy   T2DM (type 2 diabetes mellitus) ([place])    Past Surgical History:  Procedure Laterality Date   CARDIAC CATHETERIZATION     x3     last 1999   CARDIOVERSION N/A 08/12/2021   Procedure: CARDIOVERSION;  Surgeon: Fay Records, MD;  Location: [place];  Service: Cardiovascular;  Laterality: N/A;   CATARACT EXTRACTION Bilateral    COLONOSCOPY WITH PROPOFOL N/A 09/26/2014   Procedure: COLONOSCOPY WITH PROPOFOL;  Surgeon: [name], MD;  [facility];  Service: Endoscopy;  Laterality: N/A;   CORONARY ARTERY BYPASS GRAFT N/A 1999   3v; LIMA-LAD, SVG-RCA, SVG-diagonal   HOLEP-LASER ENUCLEATION OF THE PROSTATE WITH MORCELLATION N/A 06/07/2021   Procedure: HOLEP-LASER ENUCLEATION OF THE PROSTATE WITH MORCELLATION;  Surgeon: Billey Co, MD;  Location: [facility];  Service: Urology;  Laterality: N/A;   KNEE ARTHROSCOPY Left    LAPAROSCOPIC CHOLECYSTECTOMY     MASS EXCISION  04/15/2012   Procedure: EXCISION MASS;  Surgeon: [name], MD;  Location: [place];  Service: ENT;  Laterality: Left;  Left tonsil biopsy   melanomia     several skin ca removed-nose, left ankle, parotid gland left, right nodes-being followed annually..   Patient Active Problem List   Diagnosis Date Noted   SDH (subdural hematoma) ([place]) 04/11/2022   Fall (on)(from) sidewalk curb, initial encounter 04/11/2022   History of melanoma excision 04/11/2022   Diabetes mellitus type 2 in obese ([place]) 04/11/2022   DNR (do not resuscitate) 04/11/2022   Chronic anticoagulation 06/30/2021   Typical atrial flutter ([place]) 06/28/2021   Pure hypercholesterolemia 12/23/2013   Carotid artery disease ([place]) 12/23/2013   Essential hypertension, benign 12/23/2013   Angina decubitus 12/23/2013   Coronary artery disease    Cancer ([place])    Depression    Pulmonary emboli ([place])    Hypercholesteremia    Kidney stone    Chronic renal insufficiency     ONSET DATE:  04/11/22  REFERRING DIAG: V42.5XAA (ICD-10-CM) -  Subdural hematoma (HCC) W19.Merril Abbe (ICD-10-CM) - Fall, initial encounter   THERAPY DIAG:  Visuospatial deficit  Other symptoms and signs involving cognitive functions following other nontraumatic intracranial hemorrhage  Rationale for Evaluation and Treatment Rehabilitation  SUBJECTIVE:   SUBJECTIVE STATEMENT: "No pain, just an upset stomach"   Pt accompanied by: self  PERTINENT HISTORY:  76 y.o. male presents to [facility] hospital on 7[facility]14 after falling. Pt found to have small parafalcine SDH and right tentorial SDH , reporting nausea, HA, and dizziness   Fell stepping on traffic cone at work.  Doctor said if I felt like I could drive, I could.  I drove today.  Pt reports that he has had some dizziness/"spinning" but not currently.    PMH:  includes anxiety, aflutter, depression, HTN, PE, CABG, DMII.   PRECAUTIONS: Fall  PAIN:  Are you having pain? No  FALLS: Has patient fallen in last 6 months? Yes. 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  OBJECTIVE:   VISION ASSESSMENT (from eval): Impaired Ocular ROM: WFL Tracking/Visual pursuits: Decreased smoothness with horizontal tracking and to the L, particularly with R eye, with L eye--mild decr smoothness with tracking superiorly Saccades: additional eye shifts occurred during testing and to occurred when looking to the L Convergence: WFL Visual Fields: pt appeared to demo decr awareness to L visual field when stimulus on both sides simultaneously Environmental scanning with  ambulating in min distracting environment with 10/15 items located on first pass ( 67%) and additional 3 located on 2nd pass, cueing needed for remaining 2 items.  3/5 items missed initially were located on L side.     TODAY'S TREATMENT:   Constant Therapy: symbol matching level 6:  92% accuracy, 48.65 sec avg response time. Constant Therapy: symbol matching (alternating) level 1: with max difficulty remembering to alternate, difficulty switching task and max cues initially needed. Pt also w/ longer response time. 71% accuracy, 98.33 sec response time  Environmental Scanning/navigation (ambulating) down quiet hallway with 12/14 accuracy = 86% on first pass. Pt missed 2 in lower right quadrant, and able to find remaining 2 on 2nd pass and  with min cueing.      HOME EXERCISE PROGRAM: 06/09/22:  Visual/Cognitive HEP; Visual Compensation strategies 06/16/22:  Memory Compensation Strategies, lumosity/constant therapy info    GOALS: Potential Goals reviewed with patient? Yes  SHORT TERM GOALS: Target date: 06/04/22  Pt will verbalize understanding of visual compensation strategies. Goal status: IN PROGRESS  2.  Pt will verbalize understanding of HEP for visual and cognitive deficits. Goal status: IN PROGRESS  3.  Pt will perform environmental scanning/navigation in min distracting environment with at least 90% accuracy for incr safety. Baseline:  67% Goal status: MET, 06/16/22:  93% in mod distracting environment.  06/23/22:  80%    LONG TERM GOALS: Target date: 08/03/22  Pt will perform environmental scanning/navigation in mod distracting environment with at least 90% accuracy for incr safety. Goal status: IN PROGRESS  2.  Pt will perform simple environmental scanning/navigation with at least 1 other cognitive/physical task with at least 85% accuracy. Goal status: INITIAL  3.  Pt will verbalize understanding of memory/cognitive compensation strategies for ADLs/IADLs. Goal status:  INITIAL   ASSESSMENT:  CLINICAL IMPRESSION: Pt is progressing towards goals with improving balance and visual scanning, but cognitive deficits remain a barrier. Pt with difficulty alternating attention  PERFORMANCE DEFICITS in functional skills including IADLs, mobility, balance, decreased knowledge of precautions, vision, and vestibular, cognitive skills including attention, memory, and safety awareness, and psychosocial skills including environmental adaptation and habits.   IMPAIRMENTS are limiting patient from ADLs, IADLs, work, and leisure.   COMORBIDITIES may have co-morbidities  that affects occupational performance. Patient will benefit from skilled OT to address above impairments and improve overall function.  MODIFICATION OR ASSISTANCE TO COMPLETE EVALUATION: Min-Moderate modification of tasks or assist with assess necessary to complete an evaluation.  OT OCCUPATIONAL PROFILE AND HISTORY: Detailed assessment: Review of records and additional review of physical, cognitive, psychosocial history related to current functional performance.  CLINICAL DECISION MAKING: Moderate - several treatment options, min-mod task modification necessary  REHAB POTENTIAL: Good  EVALUATION COMPLEXITY: Moderate   PLAN: OT FREQUENCY: 1x/week  OT DURATION: other: for 9 visits over 12 weeks (due to scheduling needs/concerns).  PLANNED INTERVENTIONS: self care/ADL training, therapeutic exercise, therapeutic activity, balance training, functional mobility training, aquatic therapy, patient/family education, cognitive remediation/compensation, visual/perceptual remediation/compensation, and DME and/or AE instructions  RECOMMENDED OTHER SERVICES: current with PT  CONSULTED AND AGREED WITH PLAN OF CARE: Patient  PLAN FOR NEXT SESSION:  constant therapy, review cognitive strategies prn, continue with environmental scanning with emphasis on head turns (scheduled through 10/16)   Hans Eden,  OTR/L 06/30/2022, 9:29 AM

## 2022-07-07 ENCOUNTER — Encounter: Payer: Self-pay | Admitting: Occupational Therapy

## 2022-07-07 ENCOUNTER — Ambulatory Visit: Payer: Worker's Compensation | Admitting: Occupational Therapy

## 2022-07-07 DIAGNOSIS — R41842 Visuospatial deficit: Secondary | ICD-10-CM

## 2022-07-07 DIAGNOSIS — I69218 Other symptoms and signs involving cognitive functions following other nontraumatic intracranial hemorrhage: Secondary | ICD-10-CM | POA: Diagnosis not present

## 2022-07-07 DIAGNOSIS — R2681 Unsteadiness on feet: Secondary | ICD-10-CM | POA: Diagnosis not present

## 2022-07-07 NOTE — Therapy (Signed)
OUTPATIENT OCCUPATIONAL THERAPY NEURO TREATMENT  Patient Name: Evan [name] MRN: [id number] DOB:Jun 02, 1946, 76 y.o., male Today's Date: 07/07/2022  PCP: Josetta Huddle, MD REFERRING PROVIDER: Kerney Elbe, DO    OT End of Session - 07/07/22 0936     Visit Number 6    Number of Visits 9    Date for OT Re-Evaluation 08/03/22    Authorization Type Cigna 2023  covered @ 100%  $800 ded Met  $6500 OPM Met  VL: 60 (combined)  Auth Not Reqd.   [employer]  $20 copay  No Ded  $3400 OPM $0 Met  VL:MN    Authorization Time Period Approved 9 OT visits 05/05/22 - 08/03/22    Authorization - Visit Number 6    Authorization - Number of Visits 9    Progress Note Due on Visit 9    OT Start Time 0936    OT Stop Time 1015    OT Time Calculation (min) 39 min    Activity Tolerance Patient tolerated treatment well    Behavior During Therapy [facility] for tasks assessed/performed                 Past Medical History:  Diagnosis Date   Acquired dilation of ascending aorta and aortic root ([place])    a.) measurements by TTE in 04/2021 --> root 40 mm and ascending aorta 43 mm   Anginal pain (HCC)    Anxiety    Atrial flutter ([place]) 04/22/2021   a.) CHADS2VASc = 5 (age x 2, HTN, T2DM, CAD); b.) daily warfarin   Carotid disease, bilateral ([place])    a.) Doppler in 2019 --> 1-39% stenosis on RIGHT and 40-58% on LEFT   Cataracts, bilateral    Chronic anticoagulation    Warfarin   Chronic renal insufficiency    Coronary artery disease    a.) s/p 3v CABG in 1999   Depression    GERD (gastroesophageal reflux disease)    History of kidney stones    Hypercholesteremia    Hypertension    Kidney stone    Low testosterone    on TRT injections   Malignant melanoma ([place])    a.) face, nose, left leg; b.) Tx'd with chemotherapy + XRT at [facility]   Myocardial infarction [facility])    Pulmonary emboli ([place])    S/P CABG x 3 1999   a.) LIMA-LAD, SVG-RCA, SVG-diagnonal   Sleep apnea    not using nocturnal  PAP therapy   T2DM (type 2 diabetes mellitus) ([place])    Past Surgical History:  Procedure Laterality Date   CARDIAC CATHETERIZATION     x3     last 1999   CARDIOVERSION N/A 08/12/2021   Procedure: CARDIOVERSION;  Surgeon: Fay Records, MD;  Location: [place];  Service: Cardiovascular;  Laterality: N/A;   CATARACT EXTRACTION Bilateral    COLONOSCOPY WITH PROPOFOL N/A 09/26/2014   Procedure: COLONOSCOPY WITH PROPOFOL;  Surgeon: [name], MD;  [facility];  Service: Endoscopy;  Laterality: N/A;   CORONARY ARTERY BYPASS GRAFT N/A 1999   3v; LIMA-LAD, SVG-RCA, SVG-diagonal   HOLEP-LASER ENUCLEATION OF THE PROSTATE WITH MORCELLATION N/A 06/07/2021   Procedure: HOLEP-LASER ENUCLEATION OF THE PROSTATE WITH MORCELLATION;  Surgeon: Billey Co, MD;  Location: [facility];  Service: Urology;  Laterality: N/A;   KNEE ARTHROSCOPY Left    LAPAROSCOPIC CHOLECYSTECTOMY     MASS EXCISION  04/15/2012   Procedure: EXCISION MASS;  Surgeon: [name], MD;  Location: [facility];  Service: ENT;  Laterality: Left;  Left tonsil biopsy   melanomia     several skin ca removed-nose, left ankle, parotid gland left, right nodes-being followed annually..   Patient Active Problem List   Diagnosis Date Noted   SDH (subdural hematoma) ([place]) 04/11/2022   Fall (on)(from) sidewalk curb, initial encounter 04/11/2022   History of melanoma excision 04/11/2022   Diabetes mellitus type 2 in obese ([place]) 04/11/2022   DNR (do not resuscitate) 04/11/2022   Chronic anticoagulation 06/30/2021   Typical atrial flutter ([place]) 06/28/2021   Pure hypercholesterolemia 12/23/2013   Carotid artery disease ([place]) 12/23/2013   Essential hypertension, benign 12/23/2013   Angina decubitus 12/23/2013   Coronary artery disease    Cancer ([place])    Depression    Pulmonary emboli ([place])    Hypercholesteremia    Kidney stone    Chronic renal insufficiency     ONSET DATE:  04/11/22  REFERRING DIAG: S50.5XAA (ICD-10-CM) -  Subdural hematoma (HCC) W19.Merril Abbe (ICD-10-CM) - Fall, initial encounter   THERAPY DIAG:  Visuospatial deficit  Other symptoms and signs involving cognitive functions following other nontraumatic intracranial hemorrhage  Unsteadiness on feet  Rationale for Evaluation and Treatment Rehabilitation  SUBJECTIVE:   SUBJECTIVE STATEMENT: Reports that he has coloscopy tomorrow.  Pt did remember to bring glasses today, but arrived at 9:30 for 8:45 appt time due to mistakenly thinking that his appt was at 9:45 (however, due to OT cancellation, pt was able to be seen).   Pt accompanied by: self  PERTINENT HISTORY:  76 y.o. male presents to [facility] hospital on 7/14 after falling. Pt found to have small parafalcine SDH and right tentorial SDH , reporting nausea, HA, and dizziness   Fell stepping on traffic cone at work.  Doctor said if I felt like I could drive, I could.  I drove today.  Pt reports that he has had some dizziness/"spinning" but not currently.    PMH:  includes anxiety, aflutter, depression, HTN, PE, CABG, DMII.   PRECAUTIONS: Fall  PAIN:  Are you having pain? No  FALLS: Has patient fallen in last 6 months? Yes. 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  OBJECTIVE:   VISION ASSESSMENT (from eval): Impaired Ocular ROM: WFL Tracking/Visual pursuits: Decreased smoothness with horizontal tracking and to the L, particularly with R eye, with L eye--mild decr smoothness with tracking superiorly Saccades: additional eye shifts  occurred during testing and to occurred when looking to the L Convergence: WFL Visual Fields: pt appeared to demo decr awareness to L visual field when stimulus on both sides simultaneously Environmental scanning with ambulating in min distracting environment with 10/15 items located on first pass ( 67%) and additional 3 located on 2nd pass, cueing needed for remaining 2 items.  3/5 items missed initially were located on L side.     TODAY'S TREATMENT:   Placing small pegs in pegboard to copy design with incr time and 1 error for visual scanning/cognition (started design on wrong row).  73M visual scanning/number cancellation sheet with incr time and 2 errors.  Constant  Therapy: symbol matching (alternating) level 1: with mod difficulty remembering to alternate, difficulty switching task/alternating attention and mod cues initially needed.  65% accuracy, 55.10sec response time  Environmental Scanning/navigation (ambulating) in mod distracting environment (gym) with  10/15 accuracy = 67% on first pass. Pt able to find missed items on 2nd pass without cueing, but missed items on both sides.  No LOB with head turns.  Pt with max difficulty keeping up with items found on first attempt, min difficulty on second pass (for cognitive component).   Discussed progress and continued difficulties and concerns for driving.    HOME EXERCISE PROGRAM: 06/09/22:  Visual/Cognitive HEP; Visual Compensation strategies 06/16/22:  Memory Compensation Strategies, lumosity/constant therapy info    GOALS: Potential Goals reviewed with patient? Yes  SHORT TERM GOALS: Target date: 06/04/22  Pt will verbalize understanding of visual compensation strategies. Goal status: IN PROGRESS  2.  Pt will verbalize understanding of HEP for visual and cognitive deficits. Goal status: IN PROGRESS  3.  Pt will perform environmental scanning/navigation in min distracting environment with at least 90% accuracy for incr  safety. Baseline:  67% Goal status: MET, 06/16/22:  93% in mod distracting environment.  06/23/22:  80%    LONG TERM GOALS: Target date: 08/03/22  Pt will perform environmental scanning/navigation in mod distracting environment with at least 90% accuracy for incr safety. Goal status: IN PROGRESS  2.  Pt will perform simple environmental scanning/navigation with at least 1 other cognitive/physical task with at least 85% accuracy. Goal status: INITIAL  3.  Pt will verbalize understanding of memory/cognitive compensation strategies for ADLs/IADLs. Goal status: In progress   ASSESSMENT:  CLINICAL IMPRESSION: Pt is progressing towards goals with improving balance and visual scanning, but cognitive deficits remain a barrier. Pt with difficulty alternating attention and environmental scanning   PERFORMANCE DEFICITS in functional skills including IADLs, mobility, balance, decreased knowledge of precautions, vision, and vestibular, cognitive skills including attention, memory, and safety awareness, and psychosocial skills including environmental adaptation and habits.   IMPAIRMENTS are limiting patient from ADLs, IADLs, work, and leisure.   COMORBIDITIES may have co-morbidities  that affects occupational performance. Patient will benefit from skilled OT to address above impairments and improve overall function.  MODIFICATION OR ASSISTANCE TO COMPLETE EVALUATION: Min-Moderate modification of tasks or assist with assess necessary to complete an evaluation.  OT OCCUPATIONAL PROFILE AND HISTORY: Detailed assessment: Review of records and additional review of physical, cognitive, psychosocial history related to current functional performance.  CLINICAL DECISION MAKING: Moderate - several treatment options, min-mod task modification necessary  REHAB POTENTIAL: Good  EVALUATION COMPLEXITY: Moderate   PLAN: OT FREQUENCY: 1x/week  OT DURATION: other: for 9 visits over 12 weeks (due to  scheduling needs/concerns).  PLANNED INTERVENTIONS: self care/ADL training, therapeutic exercise, therapeutic activity, balance training, functional mobility training, aquatic therapy, patient/family education, cognitive remediation/compensation, visual/perceptual remediation/compensation, and DME and/or AE instructions  RECOMMENDED OTHER SERVICES: current with PT  CONSULTED AND AGREED WITH PLAN OF CARE: Patient  PLAN FOR NEXT SESSION:  check remaining goals/review education and strategies; constant therapy for alternating symbol match, continue with environmental scanning with emphasis on head turns; issue driving evaluation info (scheduled through 10/16)   [place], OTR/L 07/07/2022, 9:37 AM

## 2022-07-08 ENCOUNTER — Inpatient Hospital Stay: Admission: RE | Admit: 2022-07-08 | Payer: Medicare HMO | Source: Ambulatory Visit

## 2022-07-14 ENCOUNTER — Encounter: Payer: Self-pay | Admitting: Occupational Therapy

## 2022-07-14 ENCOUNTER — Ambulatory Visit: Payer: Worker's Compensation | Admitting: Occupational Therapy

## 2022-07-14 DIAGNOSIS — I69218 Other symptoms and signs involving cognitive functions following other nontraumatic intracranial hemorrhage: Secondary | ICD-10-CM | POA: Diagnosis not present

## 2022-07-14 DIAGNOSIS — R41842 Visuospatial deficit: Secondary | ICD-10-CM | POA: Diagnosis not present

## 2022-07-14 DIAGNOSIS — R2681 Unsteadiness on feet: Secondary | ICD-10-CM | POA: Diagnosis not present

## 2022-07-14 NOTE — Patient Instructions (Signed)
Local Driver Evaluation Programs: ° °Comprehensive Evaluation: includes clinical and in vehicle behind the wheel testing by OCCUPATIONAL THERAPIST. Programs have varying levels of adaptive controls available for trial.  ° °[facility] °[address] °[place], [place]  [address] °[phone] or [phone] °http://www.driver-rehab.com °Evaluator:  Cyndee Crompton, OT/CDRS/CDI/SCDCM/Low Vision Certification ° °[facility] °[address] °[place], [place] [address] °[phone] °https://www.[facility].org/home/services/rehabilitation.aspx °Evaluators:  Shannon Sheek, OT and Jill Tucker, OT ° °[facility] [place] [facility] (ONLY SERVES VETERANS!!) °[facility] °[address] °[place], [place]  [address] °[phone] °http://www.[place].va.gov/services/Physical_Medicine_Rehabilitation_Services.asp °Evaluators:  Eric Andrews, KT; Heidi Harris, KT;  Gary Whitaker, KT (KT=kiniesotherapist) ° ° °Clinical evaluations only:  Includes clinical testing, refers to other programs or local certified driving instructor for behind the wheel testing. ° °[facility] (outpatient Rehab) °[facility] °[address] °[place], [place] [address] °[phone] for scheduling °http://[email or web address]/Outpatient-Rehabilitation/Neurorehabilitation-Therapy.htm °Evaluators:  Kelly Lambeth, OT; Kate Phillips, OT ° °Other area clinical evaluators available upon request including [facility], [facility] and [facility]. ° ° °    Resource List °What is a Driver Evaluation: °Your Road Ahead - A Guide to Comprehensive Driving Evaluations °http://www.thehartford.com/resources/mature-market-excellence/publications-on-aging ° °Association for [employer] Rehabilitation Services - Disability and Driving Fact Sheets °http://www.aded.net/?page=510 ° °Driving after a Brain  Injury: °[employer] °http://www.biausa.org/tbims-abstracts/if-there-is-an-effective-way-to-determine-if-someone-is-ready-to-drive-after-tbi?A=SearchResult&SearchID=9495675&ObjectID=2758842&ObjectType=35 ° °Driving with Adaptive Equipment: °[facility] Process °http://www.driver-rehab.com/adaptive-equipment ° °[employer] °http://www.nmeda.com/ ° ° ° ° ° ° °  °

## 2022-07-14 NOTE — Therapy (Signed)
OUTPATIENT OCCUPATIONAL THERAPY NEURO TREATMENT  Patient Name: Evan [name] MRN: [id number] DOB:Dec 08, 1945, 76 y.o., male Today's Date: 07/14/2022  PCP: Josetta Huddle, MD REFERRING PROVIDER: Kerney Elbe, DO    OT End of Session - 07/14/22 [phone]     Visit Number 7    Number of Visits 9    Date for OT Re-Evaluation 08/03/22    Authorization Type [employer] 2023  covered @ 100%  $800 ded Met  $6500 OPM Met  VL: 60 (combined)  Auth Not Reqd.   [employer] [facility]  $20 copay  No Ded  $3400 OPM $0 Met  VL:MN    Authorization Time Period Approved 9 OT visits 05/05/22 - 08/03/22    Authorization - Visit Number 7    Authorization - Number of Visits 9    Progress Note Due on Visit 9    OT Start Time 0907   arrived late   OT Stop Time 0930    OT Time Calculation (min) 23 min    Activity Tolerance Patient tolerated treatment well    Behavior During Therapy [facility] for tasks assessed/performed                 Past Medical History:  Diagnosis Date   Acquired dilation of ascending aorta and aortic root ([place])    a.) measurements by TTE in 04/2021 --> root 40 mm and ascending aorta 43 mm   Anginal pain (HCC)    Anxiety    Atrial flutter ([place]) 04/22/2021   a.) CHADS2VASc = 5 (age x 2, HTN, T2DM, CAD); b.) daily warfarin   Carotid disease, bilateral ([place])    a.) Doppler in 2019 --> 1-39% stenosis on RIGHT and 40-58% on LEFT   Cataracts, bilateral    Chronic anticoagulation    Warfarin   Chronic renal insufficiency    Coronary artery disease    a.) s/p 3v CABG in 1999   Depression    GERD (gastroesophageal reflux disease)    History of kidney stones    Hypercholesteremia    Hypertension    Kidney stone    Low testosterone    on TRT injections   Malignant melanoma ([place])    a.) face, nose, left leg; b.) Tx'd with chemotherapy + XRT at [facility]   Myocardial infarction [facility])    Pulmonary emboli ([place])    S/P CABG x 3 1999   a.) LIMA-LAD, SVG-RCA, SVG-diagnonal   Sleep apnea    not  using nocturnal PAP therapy   T2DM (type 2 diabetes mellitus) ([place])    Past Surgical History:  Procedure Laterality Date   CARDIAC CATHETERIZATION     x3     last 1999   CARDIOVERSION N/A 08/12/2021   Procedure: CARDIOVERSION;  Surgeon: Fay Records, MD;  Location: [place];  Service: Cardiovascular;  Laterality: N/A;   CATARACT EXTRACTION Bilateral    COLONOSCOPY WITH PROPOFOL N/A 09/26/2014   Procedure: COLONOSCOPY WITH PROPOFOL;  Surgeon: [name], MD;  [facility];  Service: Endoscopy;  Laterality: N/A;   CORONARY ARTERY BYPASS GRAFT N/A 1999   3v; LIMA-LAD, SVG-RCA, SVG-diagonal   HOLEP-LASER ENUCLEATION OF THE PROSTATE WITH MORCELLATION N/A 06/07/2021   Procedure: HOLEP-LASER ENUCLEATION OF THE PROSTATE WITH MORCELLATION;  Surgeon: Billey Co, MD;  Location: [facility];  Service: Urology;  Laterality: N/A;   KNEE ARTHROSCOPY Left    LAPAROSCOPIC CHOLECYSTECTOMY     MASS EXCISION  04/15/2012   Procedure: EXCISION MASS;  Surgeon: [name], MD;  [facility];  Service: ENT;  Laterality: Left;  Left tonsil biopsy   melanomia     several skin ca removed-nose, left ankle, parotid gland left, right nodes-being followed annually..   Patient Active Problem List   Diagnosis Date Noted   SDH (subdural hematoma) ([place]) 04/11/2022   Fall (on)(from) sidewalk curb, initial encounter 04/11/2022   History of melanoma excision 04/11/2022   Diabetes mellitus type 2 in obese ([place]) 04/11/2022   DNR (do not resuscitate) 04/11/2022   Chronic anticoagulation 06/30/2021   Typical atrial flutter ([place]) 06/28/2021   Pure hypercholesterolemia 12/23/2013   Carotid artery disease ([place]) 12/23/2013   Essential hypertension, benign 12/23/2013   Angina decubitus 12/23/2013   Coronary artery disease    Cancer ([place])    Depression    Pulmonary emboli ([place])    Hypercholesteremia    Kidney stone    Chronic renal insufficiency     ONSET DATE:  04/11/22  REFERRING DIAG:  K74.5XAA (ICD-10-CM) - Subdural hematoma (HCC) W19.Merril Abbe (ICD-10-CM) - Fall, initial encounter   THERAPY DIAG:  Visuospatial deficit  Other symptoms and signs involving cognitive functions following other nontraumatic intracranial hemorrhage  Unsteadiness on feet  Rationale for Evaluation and Treatment Rehabilitation  SUBJECTIVE:   SUBJECTIVE STATEMENT: Pt arrived late due to traffic today.  Pt reports that he is having a lot of financial concerns/home repairs that have needed his attention lately.    Pt accompanied by: self  PERTINENT HISTORY:  76 y.o. male presents [facility] hospital on 7/14 after falling. Pt found to have small parafalcine SDH and right tentorial SDH , reporting nausea, HA, and dizziness   Fell stepping on traffic cone at work.  Doctor said if I felt like I could drive, I could.  I drove today.  Pt reports that he has had some dizziness/"spinning" but not currently.    PMH:  includes anxiety, aflutter, depression, HTN, PE, CABG, DMII.   PRECAUTIONS: Fall  PAIN:  Are you having pain? No  FALLS: Has patient fallen in last 6 months? Yes. Number of falls 1 with accident, "probably" others, falls 1-2x/yr  LIVING ENVIRONMENT: Lives with: lives with their family and lives with their spouse  PLOF: Independent, Vocation/Vocational requirements: driving/moving cars at [place] (generally around 28hrs/wk), retired from Teacher, music, and Leisure: play with dogs, sports (used to golf, bowl, no longer does yardwork)  [place] like to get back to doing everything that I was doing prior and do what I'd like (within reason).  OBJECTIVE:   VISION ASSESSMENT (from eval): Impaired Ocular ROM: WFL Tracking/Visual pursuits: Decreased smoothness with horizontal tracking and to the L, particularly with R eye, with L eye--mild decr smoothness with tracking superiorly Saccades: additional eye shifts occurred during testing and to occurred when looking to the  L Convergence: WFL Visual Fields: pt appeared to demo decr awareness to L visual field when stimulus on both sides simultaneously Environmental scanning with ambulating in min distracting environment with 10/15 items located on first pass ( 67%) and additional 3 located on 2nd pass, cueing needed for remaining 2 items.  3/5 items missed initially were located on L side.     TODAY'S TREATMENT:   Constant Therapy: symbol matching (alternating) level 1:  92% accuracy, 72.65 sec response time.   Environmental Scanning/navigation (ambulating) in mod distracting environment (gym) with  13/15 accuracy = 87% on first pass. Pt able to find missed items on 2nd pass without cueing.  No LOB with head turns.  Pt with mod difficulty keeping up with items found  (for alternating/divided attention)  Discussed progress and continued difficulties and concerns for driving.   PATIENT EDUCATION: Education details: Reviewed Memory Strategies, Reviewed Visual Compensation Strategies, Driving Evaluation Info/Handout  Person educated: Patient Education method: Explanation Education comprehension: verbalized understanding   HOME EXERCISE PROGRAM: 06/09/22:  Visual/Cognitive HEP; Visual Compensation strategies 06/16/22:  Memory Compensation Strategies, lumosity/constant therapy info 07/14/22:  Driving evaluation Info Issued   GOALS: Potential Goals reviewed with patient? Yes  SHORT TERM GOALS: Target date: 06/04/22  Pt will verbalize understanding of visual compensation strategies. Goal status: MET  2.  Pt will verbalize understanding of HEP for visual and cognitive deficits. Goal status: MET  3.  Pt will perform environmental scanning/navigation in min distracting environment with at least 90% accuracy for incr safety. Baseline:  67% Goal status: MET, 06/16/22:  93% in mod distracting environment.  06/23/22:  80%    LONG TERM GOALS: Target date: 08/03/22  Pt will perform environmental  scanning/navigation in mod distracting environment with at least 90% accuracy for incr safety. Goal status: NOT MET  87% at best 07/14/22  2.  Pt will perform simple environmental scanning/navigation with at least 1 other cognitive/physical task with at least 85% accuracy. Goal status: NOT MET  07/14/22.  Not met, pt demo difficulty with simple divided attention and environmental scanning  3.  Pt will verbalize understanding of memory/cognitive compensation strategies for ADLs/IADLs. Goal status: Met    ASSESSMENT:  CLINICAL IMPRESSION: Pt with improved balance and visual scanning with repetition, but cognitive deficits remain a barrier, particularly with novel tasks. Pt with continued difficulty alternating attention and environmental scanning.  Recommended driving evaluation prior to return to driving/work.  PERFORMANCE DEFICITS in functional skills including IADLs, mobility, balance, decreased knowledge of precautions, vision, and vestibular, cognitive skills including attention, memory, and safety awareness, and psychosocial skills including environmental adaptation and habits.   IMPAIRMENTS are limiting patient from ADLs, IADLs, work, and leisure.   COMORBIDITIES may have co-morbidities  that affects occupational performance. Patient will benefit from skilled OT to address above impairments and improve overall function.  MODIFICATION OR ASSISTANCE TO COMPLETE EVALUATION: Min-Moderate modification of tasks or assist with assess necessary to complete an evaluation.  OT OCCUPATIONAL PROFILE AND HISTORY: Detailed assessment: Review of records and additional review of physical, cognitive, psychosocial history related to current functional performance.  CLINICAL DECISION MAKING: Moderate - several treatment options, min-mod task modification necessary  REHAB POTENTIAL: Good  EVALUATION COMPLEXITY: Moderate   PLAN: OT FREQUENCY: 1x/week  OT DURATION: other: for 9 visits over 12 weeks  (due to scheduling needs/concerns).  PLANNED INTERVENTIONS: self care/ADL training, therapeutic exercise, therapeutic activity, balance training, functional mobility training, aquatic therapy, patient/family education, cognitive remediation/compensation, visual/perceptual remediation/compensation, and DME and/or AE instructions  RECOMMENDED OTHER SERVICES: current with PT  CONSULTED AND AGREED WITH PLAN OF CARE: Patient  PLAN FOR NEXT SESSION:  d/c OT, recommend comprehensive driving evaluation prior to return to driving.   OCCUPATIONAL THERAPY DISCHARGE SUMMARY  Visits from Start of Care: 7  Current functional level related to goals / functional outcomes: See above    Remaining deficits: Continued Visual perceptual deficits (decr visual scanning and visual attention), cognitive deficits including decr alternating/divided attention, memory deficits.  Balance has improved.   Education / Equipment: Pt instructed in Memory compensation strategies, Visual compensation strategies, driving evaluation info, Visual/Cognitive HEP.  Pt verbalized understanding of education provided.   Patient agrees to discharge. Patient goals were partially  met. Patient is being discharged due to maximized rehab potential. At this time  Pt would benefit from comprehensive driving evaluation prior to returning to driving/work.        Brier Reid, OTR/L 07/14/2022, 9:26 AM

## 2022-07-21 DIAGNOSIS — E291 Testicular hypofunction: Secondary | ICD-10-CM | POA: Diagnosis not present

## 2022-07-28 DIAGNOSIS — Z23 Encounter for immunization: Secondary | ICD-10-CM | POA: Diagnosis not present

## 2022-07-28 DIAGNOSIS — R5383 Other fatigue: Secondary | ICD-10-CM | POA: Diagnosis not present

## 2022-07-28 DIAGNOSIS — E291 Testicular hypofunction: Secondary | ICD-10-CM | POA: Diagnosis not present

## 2022-08-18 DIAGNOSIS — E291 Testicular hypofunction: Secondary | ICD-10-CM | POA: Diagnosis not present

## 2022-08-29 ENCOUNTER — Other Ambulatory Visit: Payer: Self-pay | Admitting: Cardiology

## 2022-09-08 ENCOUNTER — Ambulatory Visit
Admission: RE | Admit: 2022-09-08 | Discharge: 2022-09-08 | Disposition: A | Discharge status: Home / Self Care | Payer: Medicare HMO | Source: Ambulatory Visit | Attending: Gastroenterology | Admitting: Gastroenterology

## 2022-09-08 DIAGNOSIS — Z86010 Personal history of colonic polyps: Secondary | ICD-10-CM

## 2022-09-08 DIAGNOSIS — K5730 Diverticulosis of large intestine without perforation or abscess without bleeding: Secondary | ICD-10-CM | POA: Diagnosis not present

## 2022-09-08 DIAGNOSIS — Z85820 Personal history of malignant melanoma of skin: Secondary | ICD-10-CM | POA: Diagnosis not present

## 2022-09-08 DIAGNOSIS — K6389 Other specified diseases of intestine: Secondary | ICD-10-CM | POA: Diagnosis not present

## 2022-09-26 ENCOUNTER — Other Ambulatory Visit: Payer: Self-pay | Admitting: *Deleted

## 2022-09-26 MED ORDER — METFORMIN HCL ER 500 MG PO TB24: 1000.0000 mg | ORAL_TABLET | Freq: Two times a day (BID) | ORAL | 2 refills | Status: DC

## 2022-10-06 ENCOUNTER — Other Ambulatory Visit: Payer: Self-pay

## 2022-10-15 DIAGNOSIS — N281 Cyst of kidney, acquired: Secondary | ICD-10-CM | POA: Diagnosis not present

## 2022-10-15 DIAGNOSIS — Q6100 Congenital renal cyst, unspecified: Secondary | ICD-10-CM | POA: Diagnosis not present

## 2022-10-27 DIAGNOSIS — E291 Testicular hypofunction: Secondary | ICD-10-CM | POA: Diagnosis not present

## 2022-10-27 DIAGNOSIS — E1122 Type 2 diabetes mellitus with diabetic chronic kidney disease: Secondary | ICD-10-CM | POA: Diagnosis not present

## 2022-10-27 DIAGNOSIS — Z7901 Long term (current) use of anticoagulants: Secondary | ICD-10-CM | POA: Diagnosis not present

## 2022-10-31 DIAGNOSIS — Z7901 Long term (current) use of anticoagulants: Secondary | ICD-10-CM | POA: Diagnosis not present

## 2022-10-31 DIAGNOSIS — E291 Testicular hypofunction: Secondary | ICD-10-CM | POA: Diagnosis not present

## 2022-11-06 ENCOUNTER — Encounter (HOSPITAL_COMMUNITY): Payer: Self-pay | Admitting: *Deleted

## 2022-11-06 NOTE — Therapy (Addendum)
OUTPATIENT SPEECH LANGUAGE PATHOLOGY EVALUATION   Patient Name: Evan [name] MRN: [id number] DOB:10/25/1945, 77 y.o., male Today's Date: 11/07/2022  PCP: Josetta Huddle MD REFERRING PROVIDER: Eustace Moore MD  END OF SESSION:  End of Session - 11/07/22 0935     Visit Number 1    Number of Visits 25    Date for SLP Re-Evaluation 01/30/23    Authorization Type [employer] [place] [employer]    SLP Start Time [phone]    SLP Stop Time  0935    SLP Time Calculation (min) 49 min    Activity Tolerance Patient tolerated treatment well             Past Medical History:  Diagnosis Date   Acquired dilation of ascending aorta and aortic root ([place])    a.) measurements by TTE in 04/2021 --> root 40 mm and ascending aorta 43 mm   Anginal pain ([place])    Anxiety    Atrial flutter ([place]) 04/22/2021   a.) CHADS2VASc = 5 (age x 2, HTN, T2DM, CAD); b.) daily warfarin   Carotid disease, bilateral ([place])    a.) Doppler in 2019 --> 1-39% stenosis on RIGHT and 40-58% on LEFT   Cataracts, bilateral    Chronic anticoagulation    Warfarin   Chronic renal insufficiency    Coronary artery disease    a.) s/p 3v CABG in 1999   Depression    GERD (gastroesophageal reflux disease)    History of kidney stones    Hypercholesteremia    Hypertension    Kidney stone    Low testosterone    on TRT injections   Malignant melanoma ([place])    a.) face, nose, left leg; b.) Tx'd with chemotherapy + XRT at [facility]   Myocardial infarction [facility])    Pulmonary emboli ([place])    S/P CABG x 3 1999   a.) LIMA-LAD, SVG-RCA, SVG-diagnonal   Sleep apnea    not using nocturnal PAP therapy   T2DM (type 2 diabetes mellitus) ([place])    Past Surgical History:  Procedure Laterality Date   CARDIAC CATHETERIZATION     x3     last 1999   CARDIOVERSION N/A 08/12/2021   Procedure: CARDIOVERSION;  Surgeon: Fay Records, MD;  Location: [place];  Service: Cardiovascular;  Laterality: N/A;   CATARACT EXTRACTION Bilateral     COLONOSCOPY WITH PROPOFOL N/A 09/26/2014   Procedure: COLONOSCOPY WITH PROPOFOL;  Surgeon: [name], MD;  [facility];  Service: Endoscopy;  Laterality: N/A;   CORONARY ARTERY BYPASS GRAFT N/A 1999   3v; LIMA-LAD, SVG-RCA, SVG-diagonal   HOLEP-LASER ENUCLEATION OF THE PROSTATE WITH MORCELLATION N/A 06/07/2021   Procedure: HOLEP-LASER ENUCLEATION OF THE PROSTATE WITH MORCELLATION;  Surgeon: Billey Co, MD;  Location: [facility];  Service: Urology;  Laterality: N/A;   KNEE ARTHROSCOPY Left    LAPAROSCOPIC CHOLECYSTECTOMY     MASS EXCISION  04/15/2012   Procedure: EXCISION MASS;  Surgeon: [name], MD;  Location: [place];  Service: ENT;  Laterality: Left;  Left tonsil biopsy   melanomia     several skin ca removed-nose, left ankle, parotid gland left, right nodes-being followed annually..   Patient Active Problem List   Diagnosis Date Noted   SDH (subdural hematoma) ([place]) 04/11/2022   Fall (on)(from) sidewalk curb, initial encounter 04/11/2022   History of melanoma excision 04/11/2022   Diabetes mellitus type 2 in obese ([place]) 04/11/2022   DNR (do not resuscitate) 04/11/2022  Chronic anticoagulation 06/30/2021   Typical atrial flutter ([place]) 06/28/2021   Pure hypercholesterolemia 12/23/2013   Carotid artery disease ([place]) 12/23/2013   Essential hypertension, benign 12/23/2013   Angina decubitus 12/23/2013   Coronary artery disease    Cancer ([place])    Depression    Pulmonary emboli (HCC)    Hypercholesteremia    Kidney stone    Chronic renal insufficiency     ONSET DATE: 04/11/2022 (referral=11/06/22)  REFERRING DIAG: S06.5XAA- Traumatic subdural hemorrhage with loss of consciousness   THERAPY DIAG: Cognitive communication deficit - Plan: SLP plan of care cert/re-cert  Rationale for Evaluation and Treatment: Rehabilitation  SUBJECTIVE:   SUBJECTIVE STATEMENT: "I tripped over a traffic cone and fell head first" Pt accompanied by: self  PERTINENT HISTORY:  77 y.o. male presents to [facility] hospital on 7/14 after falling. Pt found to have small parafalcine SDH and right tentorial SDH , reporting nausea, HA, and dizziness. PMH:  includes anxiety, aflutter, depression, HTN, PE, CABG, DMII.   PAIN: Are you having pain? No  FALLS: Has patient fallen in last 6 months?  Yes, Number of falls: 3-4 (tripping)  LIVING ENVIRONMENT: Lives with: lives with their spouse Lives in: House/apartment  PLOF:  Level of assistance: Independent with ADLs, Independent with IADLs Employment: Retired  PATIENT GOALS: "I want to be able to figure things out on my own. I want to confidently talk with people and not feel like I have to run and hide "   OBJECTIVE:   COGNITION: Overall cognitive status: Impaired Areas of impairment:  Attention: Impaired: Focused, Sustained, Alternating, Divided Memory: Impaired: Immediate, Working, Short term, Prospective Awareness: Impaired: Emergent and Anticipatory Executive function: Impaired: Problem solving, Organization, Planning, Error awareness, and Slow processing Behavior: Within functional limits Functional deficits: Endorsed slower processing, reduced safety awareness, difficulty with organizing and planning for bill pay, and reduced recall/attention in conversation. Aware of difficulty problem solving and becoming more easily distracted. Success using calendar reported, but has difficulty recalling information to put on calendar if away from calendar.   AUDITORY COMPREHENSION: Overall auditory comprehension: Impaired: moderately complex YES/NO questions: Appears intact Following directions: Impaired: moderately complex Conversation: Moderately Complex Interfering components: attention, awareness, hearing, processing speed, and working memory Effective technique: extra processing time and slowed speech  READING COMPREHENSION: reportedly Intact; not tested today  EXPRESSION: verbal  VERBAL EXPRESSION: Level of  generative/spontaneous verbalization: conversation Naming: Responsive: 76-100% Interfering components: attention Comments: Intermittent anomia x3 and possible dysnomia x2 exhibited in conversation. Pt denied difficulty speaking; will continue to monitor as this could be related to attention.    WRITTEN EXPRESSION: Dominant hand: right Written expression: Appears intact  ORAL MOTOR EXAMINATION: Overall status: WFL  STANDARDIZED ASSESSMENTS: CLQT: Memory: Mild (borderline moderate), Language: WNL (borderline mild), and Clock Drawing: Severe  PATIENT REPORTED OUTCOME MEASURES (PROM): Motivational interviewing completed. PROM not completed d/t time constraints    TODAY'S TREATMENT:  11-07-22: N/A   PATIENT EDUCATION: Education details: see above Person educated: Patient Education method: Customer service manager Education comprehension: verbalized understanding, returned demonstration, and needs further education   GOALS: Goals reviewed with patient? Yes  SHORT TERM GOALS: Target date: 12/05/2022  Complete CLQT and PROM first 1-2 sessions with thorough discussion of results to heighten pt awareness  Baseline: Goal status: INITIAL  2.  Pt will follow multi-step instructions to complete functional task with ~90% accuracy given occasional min A for 2/2 opportunities  Baseline:  Goal status: INITIAL  3.  Pt will demonstrate increased attention to detail on structured iADL tasks with 3 or less errors exhibited given occasional min A  Baseline:  Goal status: INITIAL  4.  Pt will demonstrate increased safety awareness by verbalizing problem and solution prior to initiating task for 2/2 opportunities  Baseline:  Goal status: INITIAL  5.   Pt will utilize processing/attention strategies in short structured conversations to complete entirety of  thought for 80% of messages given occasional min A  Baseline:  Goal status: INITIAL  LONG TERM GOALS: Target date: 01/30/2023  Pt will successfully manage iADLs (medications, finances, appointments) with use of memory aids and compensations given rare min A form wife Baseline:  Goal status: INITIAL  2.  Pt will demonstrate improved safety awareness and attention to detail with less frequent A from wife to ID safety concerns and complete functional tasks given rare min A  Baseline:  Goal status: INITIAL  3.  Pt will utilize processing/attention strategies in extended unstructured conversations to complete entirety of thoughts for ~90% of messages given rare min A Baseline:  Goal status: INITIAL  4.  Pt will demonstrate [facility] verbal expression with no evidence of anomia/dysnomia (compensate as needed) in 30+ minute conversations x2 Baseline:  Goal status: INITIAL   ASSESSMENT:  CLINICAL IMPRESSION: Patient is a 77 y.o. male who was seen today for cognitive rehabilitation following SDH from fall in July 2023. Reported slower processing, reduced safety awareness ("slow to recognize dangerous situations"), impaired problem solving/organization, and decreased attention impacting his daily life, completion of usual tasks, and return to prior hobbies. Pt endorsed having some difficulty managing his medications, finances, and participating in conversations due to cognitive deficits. Pt would like to return to prior hobbies, including participating in car auction. Given significant functional impact and increased frustration for patient and wife, pt would benefit from skilled ST intervention to optimize cognitive functioning to maximize return to prior baseline.   OBJECTIVE IMPAIRMENTS: include attention, memory, awareness, and executive functioning. These impairments are limiting patient from managing medications, managing finances, and household responsibilities. Factors affecting potential to  achieve goals and functional outcome are ability to learn/carryover information. Patient will benefit from skilled SLP services to address above impairments and improve overall function.  REHAB POTENTIAL: Good  PLAN:  SLP FREQUENCY: 2x/week  SLP DURATION: 12 weeks  PLANNED INTERVENTIONS: Language facilitation, Environmental controls, Cueing hierachy, Cognitive reorganization, Internal/external aids, Functional tasks, Multimodal communication approach, SLP instruction and feedback, Compensatory strategies, and Patient/family education    Marzetta Board, CCC-SLP 11/07/2022, 12:31 PM

## 2022-11-07 ENCOUNTER — Ambulatory Visit: Payer: Worker's Compensation | Attending: Neurological Surgery

## 2022-11-07 DIAGNOSIS — R41841 Cognitive communication deficit: Secondary | ICD-10-CM | POA: Diagnosis present

## 2022-11-12 ENCOUNTER — Ambulatory Visit: Payer: Worker's Compensation

## 2022-11-12 DIAGNOSIS — R41841 Cognitive communication deficit: Secondary | ICD-10-CM | POA: Diagnosis not present

## 2022-11-12 NOTE — Therapy (Signed)
OUTPATIENT SPEECH LANGUAGE PATHOLOGY TREATMENT   Patient Name: Evan [name] MRN: [id number] DOB:1946/04/28, 77 y.o., male Today's Date: 11/12/2022  PCP: Josetta Huddle MD REFERRING PROVIDER: Eustace Moore MD  END OF SESSION:  End of Session - 11/12/22 0838     Visit Number 2    Number of Visits 25    Date for SLP Re-Evaluation 01/30/23    Authorization Type [employer]    SLP Start Time [phone]    SLP Stop Time  0925    SLP Time Calculation (min) 44 min    Activity Tolerance Patient tolerated treatment well             Past Medical History:  Diagnosis Date   Acquired dilation of ascending aorta and aortic root ([place])    a.) measurements by TTE in 04/2021 --> root 40 mm and ascending aorta 43 mm   Anginal pain (H[facility])    Anxiety    Atrial flutter ([place]) 04/22/2021   a.) CHADS2VASc = 5 (age x 2, HTN, T2DM, CAD); b.) daily warfarin   Carotid disease, bilateral ([place])    a.) Doppler in 2019 --> 1-39% stenosis on RIGHT and 40-58% on LEFT   Cataracts, bilateral    Chronic anticoagulation    Warfarin   Chronic renal insufficiency    Coronary artery disease    a.) s/p 3v CABG in 1999   Depression    GERD (gastroesophageal reflux disease)    History of kidney stones    Hypercholesteremia    Hypertension    Kidney stone    Low testosterone    on TRT injections   Malignant melanoma ([place])    a.) face, nose, left leg; b.) Tx'd with chemotherapy + XRT at [facility]   Myocardial infarction [facility])    Pulmonary emboli ([place])    S/P CABG x 3 1999   a.) LIMA-LAD, SVG-RCA, SVG-diagnonal   Sleep apnea    not using nocturnal PAP therapy   T2DM (type 2 diabetes mellitus) ([place])    Past Surgical History:  Procedure Laterality Date   CARDIAC CATHETERIZATION     x3     last 1999   CARDIOVERSION N/A 08/12/2021   Procedure: CARDIOVERSION;  Surgeon: Fay Records, MD;  Location: [place];  Service: Cardiovascular;  Laterality: N/A;   CATARACT EXTRACTION Bilateral     COLONOSCOPY WITH PROPOFOL N/A 09/26/2014   Procedure: COLONOSCOPY WITH PROPOFOL;  Surgeon: [name], MD;  Location: [facility];  Service: Endoscopy;  Laterality: N/A;   CORONARY ARTERY BYPASS GRAFT N/A 1999   3v; LIMA-LAD, SVG-RCA, SVG-diagonal   HOLEP-LASER ENUCLEATION OF THE PROSTATE WITH MORCELLATION N/A 06/07/2021   Procedure: HOLEP-LASER ENUCLEATION OF THE PROSTATE WITH MORCELLATION;  Surgeon: Billey Co, MD;  Location: [facility];  Service: Urology;  Laterality: N/A;   KNEE ARTHROSCOPY Left    LAPAROSCOPIC CHOLECYSTECTOMY     MASS EXCISION  04/15/2012   Procedure: EXCISION MASS;  Surgeon: [name], MD;  Location: [place];  Service: ENT;  Laterality: Left;  Left tonsil biopsy   melanomia     several skin ca removed-nose, left ankle, parotid gland left, right nodes-being followed annually..   Patient Active Problem List   Diagnosis Date Noted   SDH (subdural hematoma) ([place]) 04/11/2022   Fall (on)(from) sidewalk curb, initial encounter 04/11/2022   History of melanoma excision 04/11/2022   Diabetes mellitus type 2 in obese ([place]) 04/11/2022   DNR (do not resuscitate) 04/11/2022  Chronic anticoagulation 06/30/2021   Typical atrial flutter ([place]) 06/28/2021   Pure hypercholesterolemia 12/23/2013   Carotid artery disease ([place]) 12/23/2013   Essential hypertension, benign 12/23/2013   Angina decubitus 12/23/2013   Coronary artery disease    Cancer ([place])    Depression    Pulmonary emboli (HCC)    Hypercholesteremia    Kidney stone    Chronic renal insufficiency     ONSET DATE: 04/11/2022 (referral=11/06/22)  REFERRING DIAG: S06.5XAA- Traumatic subdural hemorrhage with loss of consciousness   THERAPY DIAG: Cognitive communication deficit  Rationale for Evaluation and Treatment: Rehabilitation  SUBJECTIVE:   SUBJECTIVE STATEMENT: "I just have a lot going on" Pt accompanied by: self  PERTINENT HISTORY: 77 y.o. male presents to [facility] hospital on 7/14 after falling.  Pt found to have small parafalcine SDH and right tentorial SDH , reporting nausea, HA, and dizziness. PMH:  includes anxiety, aflutter, depression, HTN, PE, CABG, DMII.   PAIN: Are you having pain? No  FALLS: Has patient fallen in last 6 months?  Yes, Number of falls: 3-4 (tripping)  PATIENT GOALS: "I want to be able to figure things out on my own. I want to confidently talk with people and not feel like I have to run and hide "   OBJECTIVE:   STANDARDIZED ASSESSMENTS: CLQT: Memory: Mild (borderline moderate), Language: WNL (borderline mild), and Clock Drawing: Severe Attention: Severe, Executive Functions: Moderate, and Visuospatial skills: Severe  PATIENT REPORTED OUTCOME MEASURES (PROM): Cognitive function: Short Form: 14 Very often/can't do=thinking was slow, working really hard to pay attention or would make a mistake, managing time  Often/a lot=reading something several times to understand, trouble concentrating, reading/following complex instructions, learning new tasks/instructions   TODAY'S TREATMENT:                                                                                                                                         2-914-24: Completed CLQT and PROM this session (see results above). Discussed results and clinical observations, including difficulty following/recalling multi-step instructions, reduced emergent error awareness, and impaired problem solving (I.e., getting stuck during maze, perseveration of designs). Pt reported feeling distracted and worried about finances and his wife managing everything since accident. Re-iterated ST POC established to address personally relevant goals to optimize cognitive functioning at home to reduce caregiver burden and optimize QOL. Today, targeted functional problem solving of a current problem (paying for lawn care). Mod prompting provided to ID functional problem, generate possible solutions, and evaluate and select best  solution based on physical and cognitive restrictions. Suggested this framework be utilized at home to aid problem solving, planning, and organization. Provided attention/memory strategy handout to review at home with further education and training required to implement and carryover. Pt reported he easily becomes distracted at home and has difficulty with sustained attention. Pt able to self-correct deviation in attention x1 in conversation today given min extra  processing time.   PATIENT EDUCATION: Education details: see above Person educated: Patient Education method: Consulting civil engineer, Demonstration, Verbal cues, and Handouts Education comprehension: verbalized understanding, returned demonstration, and needs further education   GOALS: Goals reviewed with patient? Yes  SHORT TERM GOALS: Target date: 12/05/2022  Complete CLQT and PROM first 1-2 sessions with thorough discussion of results to heighten pt awareness  Baseline:  Goal status: MET  2.  Pt will follow multi-step instructions to complete functional task with ~90% accuracy given occasional min A for 2/2 opportunities  Baseline:  Goal status: IN PROGRESS  3.  Pt will demonstrate increased attention to detail on structured iADL tasks with 3 or less errors exhibited given occasional min A  Baseline:  Goal status: IN PROGRESS  4.  Pt will demonstrate increased safety awareness by verbalizing problem and solution prior to initiating task for 2/2 opportunities  Baseline:  Goal status: IN PROGRESS  5.   Pt will utilize processing/attention strategies in short structured conversations to complete entirety of thought for 80% of messages given occasional min A  Baseline:  Goal status: IN PROGRESS  LONG TERM GOALS: Target date: 01/30/2023  Pt will successfully manage iADLs (medications, finances, appointments) with use of memory aids and compensations given rare min A form wife Baseline:  Goal status: IN PROGRESS  2.  Pt will  demonstrate improved safety awareness and attention to detail with less frequent A from wife to ID safety concerns and complete functional tasks given rare min A  Baseline:  Goal status: IN PROGRESS  3.  Pt will utilize processing/attention strategies in extended unstructured conversations to complete entirety of thoughts for ~90% of messages given rare min A Baseline:  Goal status: IN PROGRESS  4.  Pt will demonstrate [facility] verbal expression with no evidence of anomia/dysnomia (compensate as needed) in 30+ minute conversations x2 Baseline:  Goal status: IN PROGRESS   ASSESSMENT:  CLINICAL IMPRESSION: Patient is a 77 y.o. male who was seen today for cognitive rehabilitation following SDH from fall in July 2023. Reported slower processing, reduced safety awareness ("slow to recognize dangerous situations"), impaired problem solving/organization, and decreased attention impacting his daily life, completion of usual tasks, and return to prior hobbies. Pt endorsed having some difficulty managing his medications, finances, and participating in conversations due to cognitive deficits. Pt would like to return to prior hobbies, including participating in car auction. Given significant functional impact and increased frustration for patient and wife, pt would benefit from skilled ST intervention to optimize cognitive functioning to maximize return to prior baseline.   OBJECTIVE IMPAIRMENTS: include attention, memory, awareness, and executive functioning. These impairments are limiting patient from managing medications, managing finances, and household responsibilities. Factors affecting potential to achieve goals and functional outcome are ability to learn/carryover information. Patient will benefit from skilled SLP services to address above impairments and improve overall function.  REHAB POTENTIAL: Good  PLAN:  SLP FREQUENCY: 2x/week  SLP DURATION: 12 weeks  PLANNED INTERVENTIONS: Language  facilitation, Environmental controls, Cueing hierachy, Cognitive reorganization, Internal/external aids, Functional tasks, Multimodal communication approach, SLP instruction and feedback, Compensatory strategies, and Patient/family education    Marzetta Board, CCC-SLP 11/12/2022, 9:58 AM

## 2022-11-12 NOTE — Patient Instructions (Addendum)
To aid your problem solving, think "what is the solution(s)?" Consider your physical and cognitive state when you identify a solution. If it's not the best solution, figure out another solution    Remember, other factors can impact your thinking skills (worry, fatigue, stress, emotions, etc). I suggest writing down the things that are bothering you. You and your wife can go through your concerns and decide what you can tackle first. This is good for your executive functioning (planning, organizing, problem solving)  Strategies for Improving Your Attention and Memory  Use good eye-contact Give the speaker your undivided attention Look directly at the speaker  Complete one task at a time Avoid multitasking Complete one task before starting a new one Write a note to yourself if you think of something else that needs to be done Let others know when you need quiet time and can't be interrupted Don't answer the phone, texts, or emails while you are working on another task  Put aside distracting thoughts If you find your mind wandering, refocus your attention on the speaker Avoid off-topic comments or responses that may divert your attention  If something important comes to mind, let the speaker know and pause to write yourself a note: "Do you mind holding on a minute, I have to write something down." Put thoughts on hold and focus on salient information  Limit distractions in your environment Think about the environment around you Limit background noise by turning off the TV or music, putting your phone away Close the door and work in quiet  Use active listening Actively participate in the conversation to stay focused Paraphrase what you have heard to include the most important details Adding some associations may help you remember Ask questions to clarify certain points Summarize the speaker's comments periodically Avoid nodding your head and using "mhm" responses as these are more  passive and don't help your attention  Alert the other person/people It may be helpful to alert your listener to the fact that you may need reminders to keep on track Tell the speaker in advance that you may need to stop them and have them repeat salient information If you lose focus, interject and let the person know, "I'm sorry, I lost you, can you tell me again?"  Write down information Write down pertinent information as it comes up, such as telephone numbers, names of people, addresses, details from appointments and conversations, etc.

## 2022-11-14 ENCOUNTER — Ambulatory Visit: Payer: Worker's Compensation | Admitting: Speech Pathology

## 2022-11-14 DIAGNOSIS — E291 Testicular hypofunction: Secondary | ICD-10-CM | POA: Diagnosis not present

## 2022-11-14 DIAGNOSIS — Z5181 Encounter for therapeutic drug level monitoring: Secondary | ICD-10-CM | POA: Diagnosis not present

## 2022-11-14 DIAGNOSIS — R41841 Cognitive communication deficit: Secondary | ICD-10-CM | POA: Diagnosis not present

## 2022-11-14 NOTE — Therapy (Signed)
OUTPATIENT SPEECH LANGUAGE PATHOLOGY TREATMENT   Patient Name: Evan [name] MRN: [id number] DOB:1946-07-07, 77 y.o., male Today's Date: 11/14/2022  PCP: Josetta Huddle MD REFERRING PROVIDER: Eustace Moore MD  END OF SESSION:  End of Session - 11/14/22 0756     Visit Number 3    Number of Visits 25    Date for SLP Re-Evaluation 01/30/23    Authorization Type [employer]    SLP Start Time 0756    SLP Stop Time  [phone]    SLP Time Calculation (min) 42 min    Activity Tolerance Patient tolerated treatment well             Past Medical History:  Diagnosis Date   Acquired dilation of ascending aorta and aortic root ([place])    a.) measurements by TTE in 04/2021 --> root 40 mm and ascending aorta 43 mm   Anginal pain ([facility])    Anxiety    Atrial flutter ([place]) 04/22/2021   a.) CHADS2VASc = 5 (age x 2, HTN, T2DM, CAD); b.) daily warfarin   Carotid disease, bilateral ([place])    a.) Doppler in 2019 --> 1-39% stenosis on RIGHT and 40-58% on LEFT   Cataracts, bilateral    Chronic anticoagulation    Warfarin   Chronic renal insufficiency    Coronary artery disease    a.) s/p 3v CABG in 1999   Depression    GERD (gastroesophageal reflux disease)    History of kidney stones    Hypercholesteremia    Hypertension    Kidney stone    Low testosterone    on TRT injections   Malignant melanoma ([place])    a.) face, nose, left leg; b.) Tx'd with chemotherapy + XRT at [facility]   Myocardial infarction [facility])    Pulmonary emboli ([place])    S/P CABG x 3 1999   a.) LIMA-LAD, SVG-RCA, SVG-diagnonal   Sleep apnea    not using nocturnal PAP therapy   T2DM (type 2 diabetes mellitus) ([place])    Past Surgical History:  Procedure Laterality Date   CARDIAC CATHETERIZATION     x3     last 1999   CARDIOVERSION N/A 08/12/2021   Procedure: CARDIOVERSION;  Surgeon: Fay Records, MD;  Location: [place];  Service: Cardiovascular;  Laterality: N/A;   CATARACT EXTRACTION Bilateral     COLONOSCOPY WITH PROPOFOL N/A 09/26/2014   Procedure: COLONOSCOPY WITH PROPOFOL;  Surgeon: [name], MD;  Location: [facility];  Service: Endoscopy;  Laterality: N/A;   CORONARY ARTERY BYPASS GRAFT N/A 1999   3v; LIMA-LAD, SVG-RCA, SVG-diagonal   HOLEP-LASER ENUCLEATION OF THE PROSTATE WITH MORCELLATION N/A 06/07/2021   Procedure: HOLEP-LASER ENUCLEATION OF THE PROSTATE WITH MORCELLATION;  Surgeon: Billey Co, MD;  Location: [facility];  Service: Urology;  Laterality: N/A;   KNEE ARTHROSCOPY Left    LAPAROSCOPIC CHOLECYSTECTOMY     MASS EXCISION  04/15/2012   Procedure: EXCISION MASS;  Surgeon: [name], MD;  Location: [place];  Service: ENT;  Laterality: Left;  Left tonsil biopsy   melanomia     several skin ca removed-nose, left ankle, parotid gland left, right nodes-being followed annually..   Patient Active Problem List   Diagnosis Date Noted   SDH (subdural hematoma) ([place]) 04/11/2022   Fall (on)(from) sidewalk curb, initial encounter 04/11/2022   History of melanoma excision 04/11/2022   Diabetes mellitus type 2 in obese ([place]) 04/11/2022   DNR (do not resuscitate) 04/11/2022  Chronic anticoagulation 06/30/2021   Typical atrial flutter ([place]) 06/28/2021   Pure hypercholesterolemia 12/23/2013   Carotid artery disease ([place]) 12/23/2013   Essential hypertension, benign 12/23/2013   Angina decubitus 12/23/2013   Coronary artery disease    Cancer ([place])    Depression    Pulmonary emboli (HCC)    Hypercholesteremia    Kidney stone    Chronic renal insufficiency     ONSET DATE: 04/11/2022 (referral=11/06/22)  REFERRING DIAG: S06.5XAA- Traumatic subdural hemorrhage with loss of consciousness   THERAPY DIAG: Cognitive communication deficit  Rationale for Evaluation and Treatment: Rehabilitation  SUBJECTIVE:   SUBJECTIVE STATEMENT: "I frequently lose my train of thought." "I used to be able to dictate to other people what I wanted to happen, but I don't have  confidence in myself anymore." Pt accompanied by: self  PERTINENT HISTORY: 77 y.o. male presents to [facility] hospital on 7/14 after falling. Pt found to have small parafalcine SDH and right tentorial SDH , reporting nausea, HA, and dizziness. PMH:  includes anxiety, aflutter, depression, HTN, PE, CABG, DMII.   PAIN: Are you having pain? No  FALLS: Has patient fallen in last 6 months?  Yes, Number of falls: 3-4 (tripping)  PATIENT GOALS: "I want to be able to figure things out on my own. I want to confidently talk with people and not feel like I have to run and hide "   OBJECTIVE:   STANDARDIZED ASSESSMENTS: CLQT: Memory: Mild (borderline moderate), Language: WNL (borderline mild), and Clock Drawing: Severe Attention: Severe, Executive Functions: Moderate, and Visuospatial skills: Severe  PATIENT REPORTED OUTCOME MEASURES (PROM): Cognitive function: Short Form: 14 Very often/can't do=thinking was slow, working really hard to pay attention or would make a mistake, managing time  Often/a lot=reading something several times to understand, trouble concentrating, reading/following complex instructions, learning new tasks/instructions  TODAY'S TREATMENT:                                                                                                                                          11-14-22: Pt goes by [place]. Pt reported "I have to think thing through, and that's the hard part right now." SLP encouraged pt to externalize his thought process using visual supports, such as explicitly planning his decisions in writing. SLP facilitated discussion on metacognitive strategy instruction and introduced a goal planning framework for pt to follow. With A from SLP pt able to generate personally relevant goal (cleaning out closet), determine plan for completion, generate strategies to aid in task completion. All written down for pt as reference. SLP provided external aids (daily/weekly planner) to support pt  decision-making, planning, task execution and completion, orientation to daily goals, reduction in reported internal distractions. Pt endorsed that he uses a calendar to keep track of appointments and stated he sees the value in implementing a checklist to plan his day. Pt receptive to education and requested copy  of external aid to print at home. Provided visual, pt able to reteach strategy back to clinician with occasional min verbal cues.   Difficulty evidenced in determining functional goals but is able to ID barriers in response to questioning cues. E.g. He enjoys doing yard work, but does not [employer] any longer. Expressed desire to trim trees but acknowledges this may not be appropriate per physical limitation.  11-12-22: Completed CLQT and PROM this session (see results above). Discussed results and clinical observations, including difficulty following/recalling multi-step instructions, reduced emergent error awareness, and impaired problem solving (I.e., getting stuck during maze, perseveration of designs). Pt reported feeling distracted and worried about finances and his wife managing everything since accident. Re-iterated ST POC established to address personally relevant goals to optimize cognitive functioning at home to reduce caregiver burden and optimize QOL. Today, targeted functional problem solving of a current problem (paying for lawn care). Mod prompting provided to ID functional problem, generate possible solutions, and evaluate and select best solution based on physical and cognitive restrictions. Suggested this framework be utilized at home to aid problem solving, planning, and organization. Provided attention/memory strategy handout to review at home with further education and training required to implement and carryover. Pt reported he easily becomes distracted at home and has difficulty with sustained attention. Pt able to self-correct deviation in attention x1 in conversation  today given min extra processing time.   PATIENT EDUCATION: Education details: see above Person educated: Patient Education method: Consulting civil engineer, Demonstration, Verbal cues, and Handouts Education comprehension: verbalized understanding, returned demonstration, and needs further education   GOALS: Goals reviewed with patient? Yes  SHORT TERM GOALS: Target date: 12/05/2022  Complete CLQT and PROM first 1-2 sessions with thorough discussion of results to heighten pt awareness  Baseline:  Goal status: MET  2.  Pt will follow multi-step instructions to complete functional task with ~90% accuracy given occasional min A for 2/2 opportunities  Baseline:  Goal status: IN PROGRESS  3.  Pt will demonstrate increased attention to detail on structured iADL tasks with 3 or less errors exhibited given occasional min A  Baseline:  Goal status: IN PROGRESS  4.  Pt will demonstrate increased safety awareness by verbalizing problem and solution prior to initiating task for 2/2 opportunities  Baseline:  Goal status: IN PROGRESS  5.   Pt will utilize processing/attention strategies in short structured conversations to complete entirety of thought for 80% of messages given occasional min A  Baseline:  Goal status: IN PROGRESS  LONG TERM GOALS: Target date: 01/30/2023  Pt will successfully manage iADLs (medications, finances, appointments) with use of memory aids and compensations given rare min A form wife Baseline:  Goal status: IN PROGRESS  2.  Pt will demonstrate improved safety awareness and attention to detail with less frequent A from wife to ID safety concerns and complete functional tasks given rare min A  Baseline:  Goal status: IN PROGRESS  3.  Pt will utilize processing/attention strategies in extended unstructured conversations to complete entirety of thoughts for ~90% of messages given rare min A Baseline:  Goal status: IN PROGRESS  4.  Pt will demonstrate [facility] verbal expression  with no evidence of anomia/dysnomia (compensate as needed) in 30+ minute conversations x2 Baseline:  Goal status: IN PROGRESS   ASSESSMENT:  CLINICAL IMPRESSION: Patient is a 77 y.o. male who was seen today for cognitive rehabilitation following SDH from fall in July 2023. Reported slower processing, reduced safety awareness ("slow to recognize dangerous situations"),  impaired problem solving/organization, and decreased attention impacting his daily life, completion of usual tasks, and return to prior hobbies. Pt endorsed having some difficulty managing his medications, finances, and participating in conversations due to cognitive deficits. Pt would like to return to prior hobbies, including participating in car auction. Given significant functional impact and increased frustration for patient and wife, pt would benefit from skilled ST intervention to optimize cognitive functioning to maximize return to prior baseline.   OBJECTIVE IMPAIRMENTS: include attention, memory, awareness, and executive functioning. These impairments are limiting patient from managing medications, managing finances, and household responsibilities. Factors affecting potential to achieve goals and functional outcome are ability to learn/carryover information. Patient will benefit from skilled SLP services to address above impairments and improve overall function.  REHAB POTENTIAL: Good  PLAN:  SLP FREQUENCY: 2x/week  SLP DURATION: 12 weeks  PLANNED INTERVENTIONS: Language facilitation, Environmental controls, Cueing hierachy, Cognitive reorganization, Internal/external aids, Functional tasks, Multimodal communication approach, SLP instruction and feedback, Compensatory strategies, and Patient/family education    Leroy Libman, Student-SLP 11/14/2022, 8:38 AM

## 2022-11-14 NOTE — Patient Instructions (Signed)
For more complex problems, decision making, or multistep tasks   Plan ahead  Recognize that planning ahead will help with decision making, processing.    GOAL   Set a goal for what we hope to accomplish   PLAN   Create you plan, consider what strategies will help you be successful   DO  Execute your plan  REVIEW   How did it go?  What would you do differently?

## 2022-11-19 ENCOUNTER — Ambulatory Visit: Payer: Worker's Compensation | Attending: Neurological Surgery

## 2022-11-19 DIAGNOSIS — R41841 Cognitive communication deficit: Secondary | ICD-10-CM | POA: Insufficient documentation

## 2022-11-19 NOTE — Patient Instructions (Addendum)
Daily To-Do List:  Write 2-3 goals per day (prioritize) Be specific - think about what you can actually accomplish at once  Always consider safety! Check off when you have completed a task   Recommendations to avoid multi-tasking:  Identify if you are getting distracted - do something about it! Move to another room, turn off TV  FOCUS on one thing at a time - make a conscious effort to focus Remind yourself of your task while you are doing it   Staying engaged in conversation: Aim to say another one thing of "worth" in your conversation - talking will keep you engaged, don't just sit back and listen  If you notice you are saying "mmhmm" a lot, you are probably not actively listening  Make sure you are looking at each other with good eye contact and facing each other

## 2022-11-19 NOTE — Therapy (Addendum)
OUTPATIENT SPEECH LANGUAGE PATHOLOGY TREATMENT   Patient Name: Evan [name] MRN: [id number] DOB:02-03-1946, 77 y.o., male Today's Date: 11/19/2022  PCP: Josetta Huddle MD REFERRING PROVIDER: Eustace Moore MD  END OF SESSION:  End of Session - 11/19/22 0846     Visit Number 4    Number of Visits 25    Date for SLP Re-Evaluation 01/30/23    Authorization Type [employer]    Authorization Time Period 12 ST visits apprd    SLP Start Time [phone]    SLP Stop Time  0933    SLP Time Calculation (min) 47 min    Activity Tolerance Patient tolerated treatment well             Past Medical History:  Diagnosis Date   Acquired dilation of ascending aorta and aortic root ([place])    a.) measurements by TTE in 04/2021 --> root 40 mm and ascending aorta 43 mm   Anginal pain ([place])    Anxiety    Atrial flutter ([place]) 04/22/2021   a.) CHADS2VASc = 5 (age x 2, HTN, T2DM, CAD); b.) daily warfarin   Carotid disease, bilateral ([place])    a.) Doppler in 2019 --> 1-39% stenosis on RIGHT and 40-58% on LEFT   Cataracts, bilateral    Chronic anticoagulation    Warfarin   Chronic renal insufficiency    Coronary artery disease    a.) s/p 3v CABG in 1999   Depression    GERD (gastroesophageal reflux disease)    History of kidney stones    Hypercholesteremia    Hypertension    Kidney stone    Low testosterone    on TRT injections   Malignant melanoma ([place])    a.) face, nose, left leg; b.) Tx'd with chemotherapy + XRT at [facility]   Myocardial infarction [facility])    Pulmonary emboli ([place])    S/P CABG x 3 1999   a.) LIMA-LAD, SVG-RCA, SVG-diagnonal   Sleep apnea    not using nocturnal PAP therapy   T2DM (type 2 diabetes mellitus) ([place])    Past Surgical History:  Procedure Laterality Date   CARDIAC CATHETERIZATION     x3     last 1999   CARDIOVERSION N/A 08/12/2021   Procedure: CARDIOVERSION;  Surgeon: Fay Records, MD;  Location: [place];  Service: Cardiovascular;   Laterality: N/A;   CATARACT EXTRACTION Bilateral    COLONOSCOPY WITH PROPOFOL N/A 09/26/2014   Procedure: COLONOSCOPY WITH PROPOFOL;  Surgeon: [name], MD;  Location: [facility];  Service: Endoscopy;  Laterality: N/A;   CORONARY ARTERY BYPASS GRAFT N/A 1999   3v; LIMA-LAD, SVG-RCA, SVG-diagonal   HOLEP-LASER ENUCLEATION OF THE PROSTATE WITH MORCELLATION N/A 06/07/2021   Procedure: HOLEP-LASER ENUCLEATION OF THE PROSTATE WITH MORCELLATION;  Surgeon: Billey Co, MD;  Location: [facility];  Service: Urology;  Laterality: N/A;   KNEE ARTHROSCOPY Left    LAPAROSCOPIC CHOLECYSTECTOMY     MASS EXCISION  04/15/2012   Procedure: EXCISION MASS;  Surgeon: [name], MD;  Location: [place];  Service: ENT;  Laterality: Left;  Left tonsil biopsy   melanomia     several skin ca removed-nose, left ankle, parotid gland left, right nodes-being followed annually..   Patient Active Problem List   Diagnosis Date Noted   SDH (subdural hematoma) ([place]) 04/11/2022   Fall (on)(from) sidewalk curb, initial encounter 04/11/2022   History of melanoma excision 04/11/2022   Diabetes mellitus type 2 in obese ([place])  04/11/2022   DNR (do not resuscitate) 04/11/2022   Chronic anticoagulation 06/30/2021   Typical atrial flutter ([place]) 06/28/2021   Pure hypercholesterolemia 12/23/2013   Carotid artery disease ([place]) 12/23/2013   Essential hypertension, benign 12/23/2013   Angina decubitus 12/23/2013   Coronary artery disease    Cancer ([place])    Depression    Pulmonary emboli ([facility])    Hypercholesteremia    Kidney stone    Chronic renal insufficiency     ONSET DATE: 04/11/2022 (referral=11/06/22)  REFERRING DIAG: S06.5XAA- Traumatic subdural hemorrhage with loss of consciousness   THERAPY DIAG: Cognitive communication deficit  Rationale for Evaluation and Treatment: Rehabilitation  SUBJECTIVE:   SUBJECTIVE STATEMENT: "My wife had another idea for me - clean out the barn"  Pt accompanied by:  self  PERTINENT HISTORY: 77 y.o. male presents to [facility] hospital on 7/14 after falling. Pt found to have small parafalcine SDH and right tentorial SDH , reporting nausea, HA, and dizziness. PMH:  includes anxiety, aflutter, depression, HTN, PE, CABG, DMII.   PAIN: Are you having pain? No  FALLS: Has patient fallen in last 6 months?  Yes, Number of falls: 3-4 (tripping)  PATIENT GOALS: "I want to be able to figure things out on my own. I want to confidently talk with people and not feel like I have to run and hide "   OBJECTIVE:   TODAY'S TREATMENT:                                                                                                                                          11-19-22: Reported success using Goal-Plan-Execute-Review framework with wife modifying his goal to clean out barn. Denied any overt concerns with execution in his review today. Returned with to-do list with both appointments written down. SLP modified visual aid for clarity and ease of use to write daily goals with dates/DOW highlighted. Recommended prioritizing 2-3 goals per day, writing specific goals to aid recall, and checking off when completed. Educated patient on techniques to reduce multi-tasking to optimize task completion and engage patient in active listening as these are identified as concerns with wife at home. Modeling, verbal instruction, and handout provided to aid patient recall and carryover of targeted techniques at home. See patient instructions for detail.   11-14-22: Pt goes by [name]. Pt reported "I have to think thing through, and that's the hard part right now." SLP encouraged pt to externalize his thought process using visual supports, such as explicitly planning his decisions in writing. SLP facilitated discussion on metacognitive strategy instruction and introduced a goal planning framework for pt to follow. With A from SLP pt able to generate personally relevant goal (cleaning out closet), determine  plan for completion, generate strategies to aid in task completion. All written down for pt as reference. SLP provided external aids (daily/weekly planner) to support pt decision-making, planning, task execution and completion,  orientation to daily goals, reduction in reported internal distractions. Pt endorsed that he uses a calendar to keep track of appointments and stated he sees the value in implementing a checklist to plan his day. Pt receptive to education and requested copy of external aid to print at home. Provided visual, pt able to reteach strategy back to clinician with occasional min verbal cues.   Difficulty evidenced in determining functional goals but is able to ID barriers in response to questioning cues. E.g. He enjoys doing yard work, but does not [employer] any longer. Expressed desire to trim trees but acknowledges this may not be appropriate per physical limitation.  11-12-22: Completed CLQT and PROM this session (see results above). Discussed results and clinical observations, including difficulty following/recalling multi-step instructions, reduced emergent error awareness, and impaired problem solving (I.e., getting stuck during maze, perseveration of designs). Pt reported feeling distracted and worried about finances and his wife managing everything since accident. Re-iterated ST POC established to address personally relevant goals to optimize cognitive functioning at home to reduce caregiver burden and optimize QOL. Today, targeted functional problem solving of a current problem (paying for lawn care). Mod prompting provided to ID functional problem, generate possible solutions, and evaluate and select best solution based on physical and cognitive restrictions. Suggested this framework be utilized at home to aid problem solving, planning, and organization. Provided attention/memory strategy handout to review at home with further education and training required to implement and  carryover. Pt reported he easily becomes distracted at home and has difficulty with sustained attention. Pt able to self-correct deviation in attention x1 in conversation today given min extra processing time.   PATIENT EDUCATION: Education details: see above Person educated: Patient Education method: Consulting civil engineer, Demonstration, Verbal cues, and Handouts Education comprehension: verbalized understanding, returned demonstration, and needs further education   GOALS: Goals reviewed with patient? Yes  SHORT TERM GOALS: Target date: 12/05/2022  Complete CLQT and PROM first 1-2 sessions with thorough discussion of results to heighten pt awareness  Baseline:  Goal status: MET  2.  Pt will follow multi-step instructions to complete functional task with ~90% accuracy given occasional min A for 2/2 opportunities  Baseline:  Goal status: IN PROGRESS  3.  Pt will demonstrate increased attention to detail on structured iADL tasks with 3 or less errors exhibited given occasional min A  Baseline:  Goal status: IN PROGRESS  4.  Pt will demonstrate increased safety awareness by verbalizing problem and solution prior to initiating task for 2/2 opportunities  Baseline: 11-19-22 Goal status: IN PROGRESS  5.   Pt will utilize processing/attention strategies in short structured conversations to complete entirety of thought for 80% of messages given occasional min A  Baseline: 11-19-22 Goal status: IN PROGRESS  LONG TERM GOALS: Target date: 01/30/2023  Pt will successfully manage iADLs (medications, finances, appointments) with use of memory aids and compensations given rare min A form wife Baseline:  Goal status: IN PROGRESS  2.  Pt will demonstrate improved safety awareness and attention to detail with less frequent A from wife to ID safety concerns and complete functional tasks given rare min A  Baseline:  Goal status: IN PROGRESS  3.  Pt will utilize processing/attention strategies in extended  unstructured conversations to complete entirety of thoughts for ~90% of messages given rare min A Baseline:  Goal status: IN PROGRESS  4.  Pt will demonstrate [facility] verbal expression with no evidence of anomia/dysnomia (compensate as needed) in 30+ minute conversations x2 Baseline:  Goal status: IN PROGRESS   ASSESSMENT:  CLINICAL IMPRESSION: Patient is a 77 y.o. male who was seen today for cognitive rehabilitation following SDH from fall in July 2023. Initially reported slower processing, reduced safety awareness ("slow to recognize dangerous situations"), impaired problem solving/organization, and decreased attention impacting his daily life, completion of usual tasks, and return to prior hobbies. Pt endorsed having some difficulty managing his medications, finances, and participating in conversations due to cognitive deficits. Conducted ongoing education and instruction of memory, attention, and problem solving strategies to optimize cognitive functioning with pertinent tasks. Given significant functional impact and increased frustration for patient and wife, pt would benefit from skilled ST intervention to optimize cognitive functioning to maximize return to prior baseline.   OBJECTIVE IMPAIRMENTS: include attention, memory, awareness, and executive functioning. These impairments are limiting patient from managing medications, managing finances, and household responsibilities. Factors affecting potential to achieve goals and functional outcome are ability to learn/carryover information. Patient will benefit from skilled SLP services to address above impairments and improve overall function.  REHAB POTENTIAL: Good  PLAN:  SLP FREQUENCY: 2x/week  SLP DURATION: 12 weeks  PLANNED INTERVENTIONS: Language facilitation, Environmental controls, Cueing hierachy, Cognitive reorganization, Internal/external aids, Functional tasks, Multimodal communication approach, SLP instruction and feedback,  Compensatory strategies, and Patient/family education    Marzetta Board, CCC-SLP 11/19/2022, 9:46 AM

## 2022-11-21 ENCOUNTER — Ambulatory Visit: Payer: Worker's Compensation | Attending: Neurological Surgery

## 2022-11-21 DIAGNOSIS — R41841 Cognitive communication deficit: Secondary | ICD-10-CM | POA: Insufficient documentation

## 2022-11-21 NOTE — Therapy (Signed)
OUTPATIENT SPEECH LANGUAGE PATHOLOGY TREATMENT   Patient Name: Evan [name] MRN: [id number] DOB:27-Jan-1946, 77 y.o., male Today's Date: 11/21/2022  PCP: Josetta Huddle MD REFERRING PROVIDER: Eustace Moore MD  END OF SESSION:  End of Session - 11/21/22 0835     Visit Number 5    Number of Visits 25    Date for SLP Re-Evaluation 01/30/23    Authorization Type [employer]    Authorization Time Period 12 ST visits apprd    SLP Start Time 0845    SLP Stop Time  0930    SLP Time Calculation (min) 45 min    Activity Tolerance Patient tolerated treatment well             Past Medical History:  Diagnosis Date   Acquired dilation of ascending aorta and aortic root ([place])    a.) measurements by TTE in 04/2021 --> root 40 mm and ascending aorta 43 mm   Anginal pain (H[facility])    Anxiety    Atrial flutter ([place]) 04/22/2021   a.) CHADS2VASc = 5 (age x 2, HTN, T2DM, CAD); b.) daily warfarin   Carotid disease, bilateral ([place])    a.) Doppler in 2019 --> 1-39% stenosis on RIGHT and 40-58% on LEFT   Cataracts, bilateral    Chronic anticoagulation    Warfarin   Chronic renal insufficiency    Coronary artery disease    a.) s/p 3v CABG in 1999   Depression    GERD (gastroesophageal reflux disease)    History of kidney stones    Hypercholesteremia    Hypertension    Kidney stone    Low testosterone    on TRT injections   Malignant melanoma ([place])    a.) face, nose, left leg; b.) Tx'd with chemotherapy + XRT at [facility]   Myocardial infarction [facility])    Pulmonary emboli ([place])    S/P CABG x 3 1999   a.) LIMA-LAD, SVG-RCA, SVG-diagnonal   Sleep apnea    not using nocturnal PAP therapy   T2DM (type 2 diabetes mellitus) ([place])    Past Surgical History:  Procedure Laterality Date   CARDIAC CATHETERIZATION     x3     last 1999   CARDIOVERSION N/A 08/12/2021   Procedure: CARDIOVERSION;  Surgeon: Fay Records, MD;  Location: [place];  Service: Cardiovascular;   Laterality: N/A;   CATARACT EXTRACTION Bilateral    COLONOSCOPY WITH PROPOFOL N/A 09/26/2014   Procedure: COLONOSCOPY WITH PROPOFOL;  Surgeon: [name], MD;  Location: [facility];  Service: Endoscopy;  Laterality: N/A;   CORONARY ARTERY BYPASS GRAFT N/A 1999   3v; LIMA-LAD, SVG-RCA, SVG-diagonal   HOLEP-LASER ENUCLEATION OF THE PROSTATE WITH MORCELLATION N/A 06/07/2021   Procedure: HOLEP-LASER ENUCLEATION OF THE PROSTATE WITH MORCELLATION;  Surgeon: Billey Co, MD;  Location: [facility];  Service: Urology;  Laterality: N/A;   KNEE ARTHROSCOPY Left    LAPAROSCOPIC CHOLECYSTECTOMY     MASS EXCISION  04/15/2012   Procedure: EXCISION MASS;  Surgeon: [name], MD;  Location: [place];  Service: ENT;  Laterality: Left;  Left tonsil biopsy   melanomia     several skin ca removed-nose, left ankle, parotid gland left, right nodes-being followed annually..   Patient Active Problem List   Diagnosis Date Noted   SDH (subdural hematoma) ([place]) 04/11/2022   Fall (on)(from) sidewalk curb, initial encounter 04/11/2022   History of melanoma excision 04/11/2022   Diabetes mellitus type 2 in obese ([place])  04/11/2022   DNR (do not resuscitate) 04/11/2022   Chronic anticoagulation 06/30/2021   Typical atrial flutter ([place]) 06/28/2021   Pure hypercholesterolemia 12/23/2013   Carotid artery disease ([place]) 12/23/2013   Essential hypertension, benign 12/23/2013   Angina decubitus 12/23/2013   Coronary artery disease    Cancer ([place])    Depression    Pulmonary emboli (HCC)    Hypercholesteremia    Kidney stone    Chronic renal insufficiency     ONSET DATE: 04/11/2022 (referral=11/06/22)  REFERRING DIAG: S06.5XAA- Traumatic subdural hemorrhage with loss of consciousness   THERAPY DIAG: Cognitive communication deficit  Rationale for Evaluation and Treatment: Rehabilitation  SUBJECTIVE:   SUBJECTIVE STATEMENT: "I can tell you this from memory - maybe I can't"  Pt accompanied by:  self  PERTINENT HISTORY: 77 y.o. male [facility] hospital on [facility] after falling. Pt found to have small parafalcine SDH and right tentorial SDH , reporting nausea, HA, and dizziness. PMH:  includes anxiety, aflutter, depression, HTN, PE, CABG, DMII.   PAIN: Are you having pain? No  FALLS: Has patient fallen in last 6 months?  Yes, Number of falls: 3-4 (tripping)  PATIENT GOALS: "I want to be able to figure things out on my own. I want to confidently talk with people and not feel like I have to run and hide "   OBJECTIVE:   TODAY'S TREATMENT:                                                                                                                                          11-21-22: Successfully utilized to-do lists to aid recall and task completion, with pt completing 4/4 items on to-do list. Some mild improvements in safety awareness noted with pt recognizing safety concern x1 without A. For additional situations, max A required to ID safety concerns (ex: jacking up loft alone) and max A for functional problem solving (ex: only 15 mins between appointments). Intermittent mod A required to use the calendar this date to ID month and date. Occasional deviations in attention noted in conversation, with pt able to self-correct with mild additional processing time and rare cues. Success managing medications and finances reported with visual aids provided by wife.   11-19-22: Reported success using Goal-Plan-Execute-Review framework with wife modifying his goal to clean out barn. Denied any overt concerns with execution in his review today. Returned with to-do list with both appointments written down. SLP modified visual aid for clarity and ease of use to write daily goals with dates/DOW highlighted. Recommended prioritizing 2-3 goals per day, writing specific goals to aid recall, and checking off when completed. Educated patient on techniques to reduce multi-tasking to optimize task completion and  engage patient in active listening as these are identified as concerns with wife at home. Modeling, verbal instruction, and handout provided to aid patient recall and carryover of targeted techniques at home.  See patient instructions for detail.   11-14-22: Pt goes by [facility]. Pt reported "I have to think thing through, and that's the hard part right now." SLP encouraged pt to externalize his thought process using visual supports, such as explicitly planning his decisions in writing. SLP facilitated discussion on metacognitive strategy instruction and introduced a goal planning framework for pt to follow. With A from SLP pt able to generate personally relevant goal (cleaning out closet), determine plan for completion, generate strategies to aid in task completion. All written down for pt as reference. SLP provided external aids (daily/weekly planner) to support pt decision-making, planning, task execution and completion, orientation to daily goals, reduction in reported internal distractions. Pt endorsed that he uses a calendar to keep track of appointments and stated he sees the value in implementing a checklist to plan his day. Pt receptive to education and requested copy of external aid to print at home. Provided visual, pt able to reteach strategy back to clinician with occasional min verbal cues.   Difficulty evidenced in determining functional goals but is able to ID barriers in response to questioning cues. E.g. He enjoys doing yard work, but does not [employer] any longer. Expressed desire to trim trees but acknowledges this may not be appropriate per physical limitation.  11-12-22: Completed CLQT and PROM this session (see results above). Discussed results and clinical observations, including difficulty following/recalling multi-step instructions, reduced emergent error awareness, and impaired problem solving (I.e., getting stuck during maze, perseveration of designs). Pt reported feeling distracted  and worried about finances and his wife managing everything since accident. Re-iterated ST POC established to address personally relevant goals to optimize cognitive functioning at home to reduce caregiver burden and optimize QOL. Today, targeted functional problem solving of a current problem (paying for lawn care). Mod prompting provided to ID functional problem, generate possible solutions, and evaluate and select best solution based on physical and cognitive restrictions. Suggested this framework be utilized at home to aid problem solving, planning, and organization. Provided attention/memory strategy handout to review at home with further education and training required to implement and carryover. Pt reported he easily becomes distracted at home and has difficulty with sustained attention. Pt able to self-correct deviation in attention x1 in conversation today given min extra processing time.   PATIENT EDUCATION: Education details: see above Person educated: Patient Education method: Consulting civil engineer, Demonstration, Verbal cues, and Handouts Education comprehension: verbalized understanding, returned demonstration, and needs further education   GOALS: Goals reviewed with patient? Yes  SHORT TERM GOALS: Target date: 12/05/2022  Complete CLQT and PROM first 1-2 sessions with thorough discussion of results to heighten pt awareness  Baseline:  Goal status: MET  2.  Pt will follow multi-step instructions to complete functional task with ~90% accuracy given occasional min A for 2/2 opportunities  Baseline:  Goal status: IN PROGRESS  3.  Pt will demonstrate increased attention to detail on structured iADL tasks with 3 or less errors exhibited given occasional min A  Baseline:  Goal status: IN PROGRESS  4.  Pt will demonstrate increased safety awareness by verbalizing problem and solution prior to initiating task for 2/2 opportunities  Baseline: 11-19-22 Goal status: IN PROGRESS  5.   Pt will  utilize processing/attention strategies in short structured conversations to complete entirety of thought for 80% of messages given occasional min A  Baseline: 11-19-22, 11-21-22 Goal status: MET  LONG TERM GOALS: Target date: 01/30/2023  Pt will successfully manage iADLs (medications, finances, appointments) with  use of memory aids and compensations given rare min A form wife Baseline:  Goal status: IN PROGRESS  2.  Pt will demonstrate improved safety awareness and attention to detail with less frequent A from wife to ID safety concerns and complete functional tasks given rare min A  Baseline:  Goal status: IN PROGRESS  3.  Pt will utilize processing/attention strategies in extended unstructured conversations to complete entirety of thoughts for ~90% of messages given rare min A Baseline:  Goal status: IN PROGRESS  4.  Pt will demonstrate [facility] verbal expression with no evidence of anomia/dysnomia (compensate as needed) in 30+ minute conversations x2 Baseline:  Goal status: IN PROGRESS   ASSESSMENT:  CLINICAL IMPRESSION: Patient is a 77 y.o. male who was seen today for cognitive rehabilitation following SDH from fall in July 2023. Initially reported slower processing, reduced safety awareness ("slow to recognize dangerous situations"), impaired problem solving/organization, and decreased attention impacting his daily life, completion of usual tasks, and return to prior hobbies. Pt endorsed having some difficulty managing his medications, finances, and participating in conversations due to cognitive deficits. Conducted ongoing education and instruction of memory, attention, and problem solving strategies to optimize cognitive functioning with pertinent tasks. Given significant functional impact and increased frustration for patient and wife, pt would benefit from skilled ST intervention to optimize cognitive functioning to maximize return to prior baseline.   OBJECTIVE IMPAIRMENTS: include  attention, memory, awareness, and executive functioning. These impairments are limiting patient from managing medications, managing finances, and household responsibilities. Factors affecting potential to achieve goals and functional outcome are ability to learn/carryover information. Patient will benefit from skilled SLP services to address above impairments and improve overall function.  REHAB POTENTIAL: Good  PLAN:  SLP FREQUENCY: 2x/week  SLP DURATION: 12 weeks  PLANNED INTERVENTIONS: Language facilitation, Environmental controls, Cueing hierachy, Cognitive reorganization, Internal/external aids, Functional tasks, Multimodal communication approach, SLP instruction and feedback, Compensatory strategies, and Patient/family education    Marzetta Board, CCC-SLP 11/21/2022, 8:45 AM

## 2022-11-21 NOTE — Patient Instructions (Addendum)
Safety Awareness:  Stop and THINK before you act  Should I be doing this? Do I need help to do this task safely? What are the consequences if something went wrong? Could I have gotten hurt? Could something have broken?  Routine and repetition are helpful for your memory. Establishing consistent routines are essential.  Ex: taking medications before checking your email   For bill pay, ask Vaughan Basta if she can add some extra room for unexpected bills since you identified this would be helpful   Schedule Update: We changed your next Friday appointment (March 1st) to 9:30 AM because you told me you have a doctor appointment at 8:30. That is only 15 minutes between our meeting times. We decided that would not be enough time between so I moved that appointment back

## 2022-11-24 ENCOUNTER — Other Ambulatory Visit: Payer: Self-pay | Admitting: Cardiology

## 2022-11-25 NOTE — Therapy (Unsigned)
OUTPATIENT SPEECH LANGUAGE PATHOLOGY TREATMENT   Patient Name: Evan [name] MRN: [id number] DOB:02-Apr-1946, 77 y.o., male Today's Date: 11/26/2022  PCP: Josetta Huddle MD REFERRING PROVIDER: Eustace Moore MD  END OF SESSION:  End of Session - 11/26/22 0840     Visit Number 6    Number of Visits 25    Date for SLP Re-Evaluation 01/30/23    Authorization Type [employer]    Authorization Time Period 12 ST visits apprd    SLP Start Time 0845    SLP Stop Time  0932    SLP Time Calculation (min) 47 min    Activity Tolerance Patient tolerated treatment well              Past Medical History:  Diagnosis Date   Acquired dilation of ascending aorta and aortic root ([place])    a.) measurements by TTE in 04/2021 --> root 40 mm and ascending aorta 43 mm   Anginal pain ([place])    Anxiety    Atrial flutter ([place]) 04/22/2021   a.) CHADS2VASc = 5 (age x 2, HTN, T2DM, CAD); b.) daily warfarin   Carotid disease, bilateral ([place])    a.) Doppler in 2019 --> 1-39% stenosis on RIGHT and 40-58% on LEFT   Cataracts, bilateral    Chronic anticoagulation    Warfarin   Chronic renal insufficiency    Coronary artery disease    a.) s/p 3v CABG in 1999   Depression    GERD (gastroesophageal reflux disease)    History of kidney stones    Hypercholesteremia    Hypertension    Kidney stone    Low testosterone    on TRT injections   Malignant melanoma ([place])    a.) face, nose, left leg; b.) Tx'd with chemotherapy + XRT at [facility]   Myocardial infarction [facility])    Pulmonary emboli ([place])    S/P CABG x 3 1999   a.) LIMA-LAD, SVG-RCA, SVG-diagnonal   Sleep apnea    not using nocturnal PAP therapy   T2DM (type 2 diabetes mellitus) ([place])    Past Surgical History:  Procedure Laterality Date   CARDIAC CATHETERIZATION     x3     last 1999   CARDIOVERSION N/A 08/12/2021   Procedure: CARDIOVERSION;  Surgeon: Fay Records, MD;  Location: [facility];  Service: Cardiovascular;   Laterality: N/A;   CATARACT EXTRACTION Bilateral    COLONOSCOPY WITH PROPOFOL N/A 09/26/2014   Procedure: COLONOSCOPY WITH PROPOFOL;  Surgeon: [name], MD;  Location: [facility];  Service: Endoscopy;  Laterality: N/A;   CORONARY ARTERY BYPASS GRAFT N/A 1999   3v; LIMA-LAD, SVG-RCA, SVG-diagonal   HOLEP-LASER ENUCLEATION OF THE PROSTATE WITH MORCELLATION N/A 06/07/2021   Procedure: HOLEP-LASER ENUCLEATION OF THE PROSTATE WITH MORCELLATION;  Surgeon: Billey Co, MD;  Location: [facility];  Service: Urology;  Laterality: N/A;   KNEE ARTHROSCOPY Left    LAPAROSCOPIC CHOLECYSTECTOMY     MASS EXCISION  04/15/2012   Procedure: EXCISION MASS;  Surgeon: [name], MD;  Location: [facility];  Service: ENT;  Laterality: Left;  Left tonsil biopsy   melanomia     several skin ca removed-nose, left ankle, parotid gland left, right nodes-being followed annually..   Patient Active Problem List   Diagnosis Date Noted   SDH (subdural hematoma) ([place]) 04/11/2022   Fall (on)(from) sidewalk curb, initial encounter 04/11/2022   History of melanoma excision 04/11/2022   Diabetes mellitus type 2 in obese (  [place]) 04/11/2022   DNR (do not resuscitate) 04/11/2022   Chronic anticoagulation 06/30/2021   Typical atrial flutter ([place]) 06/28/2021   Pure hypercholesterolemia 12/23/2013   Carotid artery disease ([place]) 12/23/2013   Essential hypertension, benign 12/23/2013   Angina decubitus 12/23/2013   Coronary artery disease    Cancer ([place])    Depression    Pulmonary emboli ([facility])    Hypercholesteremia    Kidney stone    Chronic renal insufficiency     ONSET DATE: 04/11/2022 (referral=11/06/22)  REFERRING DIAG: S06.5XAA- Traumatic subdural hemorrhage with loss of consciousness   THERAPY DIAG: Cognitive communication deficit  Rationale for Evaluation and Treatment: Rehabilitation  SUBJECTIVE:   SUBJECTIVE STATEMENT: "I'm going through a rough time" Pt accompanied by: self  PERTINENT HISTORY: 77  y.o. male presents to [facility] on 7/14 after falling. Pt found to have small parafalcine SDH and right tentorial SDH , reporting nausea, HA, and dizziness. PMH:  includes anxiety, aflutter, depression, HTN, PE, CABG, DMII.   PAIN: Are you having pain? No  FALLS: Has patient fallen in last 6 months?  Yes, Number of falls: 3-4 (tripping)  PATIENT GOALS: "I want to be able to figure things out on my own. I want to confidently talk with people and not feel like I have to run and hide "   OBJECTIVE:   TODAY'S TREATMENT:                                                                                                                                          11-25-22: Reported recent personal event which was distressing patient. SLP re-educated impact of internal/external distractions on attention and how to manage to reduce impact on focused attention. Today, pt required occasional min-mod A and extra processing time to re-direct attention back to conversation x3 today. Pt exhibiting some slow improvements in safety awareness with use of trained techniques, with pt reporting locking barn d/t safety concern. Some discrepancies in appointments and disorientation exhibited. SLP cued patient to locate calendar, in which pt flipped past calendar x2 indicating reduced awareness and seemingly decreased attention/recall of target. Max A required to locate calendar. Min A required to ID relevant information on calendar. Pt reportedly managing his medications independently. Told SLP he bought extra pill box to fill out two weeks at a time. Based on patient reduced attention to detail and impaired error awareness, facilitated task to verbally sort novel medications x3. Usual re-reading of pill bottle required to aid recall of instructions, with usual mod to max A required to ID placement given unfamiliar pill box. Intermittent mod cues required to place previous bottle out of the way to reduce repetition. Requested pt  bring his typed list of current medications and new empty pill box to practice next session given functional implications.   11-21-22: Successfully utilized to-do lists to aid recall and task completion, with pt completing 4/4  items on to-do list. Some mild improvements in safety awareness noted with pt recognizing safety concern x1 without A. For additional situations, max A required to ID safety concerns (ex: jacking up loft alone) and max A for functional problem solving (ex: only 15 mins between appointments). Intermittent mod A required to use the calendar this date to ID month and date. Occasional deviations in attention noted in conversation, with pt able to self-correct with mild additional processing time and rare cues. Success managing medications and finances reported with visual aids provided by wife.   11-19-22: Reported success using Goal-Plan-Execute-Review framework with wife modifying his goal to clean out barn. Denied any overt concerns with execution in his review today. Returned with to-do list with both appointments written down. SLP modified visual aid for clarity and ease of use to write daily goals with dates/DOW highlighted. Recommended prioritizing 2-3 goals per day, writing specific goals to aid recall, and checking off when completed. Educated patient on techniques to reduce multi-tasking to optimize task completion and engage patient in active listening as these are identified as concerns with wife at home. Modeling, verbal instruction, and handout provided to aid patient recall and carryover of targeted techniques at home. See patient instructions for detail.   11-14-22: Pt goes by [facility]. Pt reported "I have to think thing through, and that's the hard part right now." SLP encouraged pt to externalize his thought process using visual supports, such as explicitly planning his decisions in writing. SLP facilitated discussion on metacognitive strategy instruction and introduced a goal  planning framework for pt to follow. With A from SLP pt able to generate personally relevant goal (cleaning out closet), determine plan for completion, generate strategies to aid in task completion. All written down for pt as reference. SLP provided external aids (daily/weekly planner) to support pt decision-making, planning, task execution and completion, orientation to daily goals, reduction in reported internal distractions. Pt endorsed that he uses a calendar to keep track of appointments and stated he sees the value in implementing a checklist to plan his day. Pt receptive to education and requested copy of external aid to print at home. Provided visual, pt able to reteach strategy back to clinician with occasional min verbal cues.   Difficulty evidenced in determining functional goals but is able to ID barriers in response to questioning cues. E.g. He enjoys doing yard work, but does not [employer] any longer. Expressed desire to trim trees but acknowledges this may not be appropriate per physical limitation.  11-12-22: Completed CLQT and PROM this session (see results above). Discussed results and clinical observations, including difficulty following/recalling multi-step instructions, reduced emergent error awareness, and impaired problem solving (I.e., getting stuck during maze, perseveration of designs). Pt reported feeling distracted and worried about finances and his wife managing everything since accident. Re-iterated ST POC established to address personally relevant goals to optimize cognitive functioning at home to reduce caregiver burden and optimize QOL. Today, targeted functional problem solving of a current problem (paying for lawn care). Mod prompting provided to ID functional problem, generate possible solutions, and evaluate and select best solution based on physical and cognitive restrictions. Suggested this framework be utilized at home to aid problem solving, planning, and  organization. Provided attention/memory strategy handout to review at home with further education and training required to implement and carryover. Pt reported he easily becomes distracted at home and has difficulty with sustained attention. Pt able to self-correct deviation in attention x1 in conversation today given min  extra processing time.   PATIENT EDUCATION: Education details: see above Person educated: Patient Education method: Consulting civil engineer, Demonstration, Verbal cues, and Handouts Education comprehension: verbalized understanding, returned demonstration, and needs further education   GOALS: Goals reviewed with patient? Yes  SHORT TERM GOALS: Target date: 12/05/2022  Complete CLQT and PROM first 1-2 sessions with thorough discussion of results to heighten pt awareness  Baseline:  Goal status: MET  2.  Pt will follow multi-step instructions to complete functional task with ~90% accuracy given occasional min A for 2/2 opportunities  Baseline:  Goal status: IN PROGRESS  3.  Pt will demonstrate increased attention to detail on structured iADL tasks with 3 or less errors exhibited given occasional min A  Baseline:  Goal status: IN PROGRESS  4.  Pt will demonstrate increased safety awareness by verbalizing problem and solution prior to initiating task for 2/2 opportunities  Baseline: 11-19-22 Goal status: IN PROGRESS  5.   Pt will utilize processing/attention strategies in short structured conversations to complete entirety of thought for 80% of messages given occasional min A  Baseline: 11-19-22, 11-21-22 Goal status: MET  LONG TERM GOALS: Target date: 01/30/2023  Pt will successfully manage iADLs (medications, finances, appointments) with use of memory aids and compensations given rare min A form wife Baseline:  Goal status: IN PROGRESS  2.  Pt will demonstrate improved safety awareness and attention to detail with less frequent A from wife to ID safety concerns and complete  functional tasks given rare min A  Baseline:  Goal status: IN PROGRESS  3.  Pt will utilize processing/attention strategies in extended unstructured conversations to complete entirety of thoughts for ~90% of messages given rare min A Baseline:  Goal status: IN PROGRESS  4.  Pt will demonstrate [facility] verbal expression with no evidence of anomia/dysnomia (compensate as needed) in 30+ minute conversations x2 Baseline:  Goal status: IN PROGRESS   ASSESSMENT:  CLINICAL IMPRESSION: Patient is a 77 y.o. male who was seen today for cognitive rehabilitation following SDH from fall in July 2023. Initially reported slower processing, reduced safety awareness ("slow to recognize dangerous situations"), impaired problem solving/organization, and decreased attention impacting his daily life, completion of usual tasks, and return to prior hobbies. Pt endorsed having some difficulty managing his medications, finances, and participating in conversations due to cognitive deficits. Conducted ongoing education and instruction of memory, attention, and problem solving strategies to optimize cognitive functioning with pertinent tasks. Given significant functional impact and increased frustration for patient and wife, pt would benefit from skilled ST intervention to optimize cognitive functioning to maximize return to prior baseline.   OBJECTIVE IMPAIRMENTS: include attention, memory, awareness, and executive functioning. These impairments are limiting patient from managing medications, managing finances, and household responsibilities. Factors affecting potential to achieve goals and functional outcome are ability to learn/carryover information. Patient will benefit from skilled SLP services to address above impairments and improve overall function.  REHAB POTENTIAL: Good  PLAN:  SLP FREQUENCY: 2x/week  SLP DURATION: 12 weeks  PLANNED INTERVENTIONS: Language facilitation, Environmental controls, Cueing  hierachy, Cognitive reorganization, Internal/external aids, Functional tasks, Multimodal communication approach, SLP instruction and feedback, Compensatory strategies, and Patient/family education    Marzetta Board, CCC-SLP 11/26/2022, 9:39 AM

## 2022-11-26 ENCOUNTER — Ambulatory Visit: Payer: Worker's Compensation

## 2022-11-26 DIAGNOSIS — R41841 Cognitive communication deficit: Secondary | ICD-10-CM | POA: Diagnosis not present

## 2022-11-26 DIAGNOSIS — Z7901 Long term (current) use of anticoagulants: Secondary | ICD-10-CM | POA: Diagnosis not present

## 2022-11-26 NOTE — Patient Instructions (Signed)
For Friday (9:30 AM), please bring your medication list and 1 empty pill box. We want to practice sorting the medications to ensure you aren't making any errors   Please fill out your weekly to-do list for this week

## 2022-11-28 ENCOUNTER — Ambulatory Visit: Payer: Worker's Compensation | Attending: Neurological Surgery

## 2022-11-28 DIAGNOSIS — R41841 Cognitive communication deficit: Secondary | ICD-10-CM | POA: Insufficient documentation

## 2022-11-28 NOTE — Therapy (Signed)
OUTPATIENT SPEECH LANGUAGE PATHOLOGY TREATMENT   Patient Name: Evan [name] MRN: [id number] DOB:June 14, 1946, 77 y.o., male Today's Date: 11/28/2022  PCP: Josetta Huddle MD REFERRING PROVIDER: Eustace Moore MD  END OF SESSION:  End of Session - 11/28/22 0937     Visit Number 7    Number of Visits 25    Date for SLP Re-Evaluation 01/30/23    Authorization Type [employer]    Authorization Time Period 12 ST visits apprd    SLP Start Time 0933    SLP Stop Time  1015    SLP Time Calculation (min) 42 min    Activity Tolerance Patient tolerated treatment well              Past Medical History:  Diagnosis Date   Acquired dilation of ascending aorta and aortic root ([place])    a.) measurements by TTE in 04/2021 --> root 40 mm and ascending aorta 43 mm   Anginal pain ([place])    Anxiety    Atrial flutter ([place]) 04/22/2021   a.) CHADS2VASc = 5 (age x 2, HTN, T2DM, CAD); b.) daily warfarin   Carotid disease, bilateral ([place])    a.) Doppler in 2019 --> 1-39% stenosis on RIGHT and 40-58% on LEFT   Cataracts, bilateral    Chronic anticoagulation    Warfarin   Chronic renal insufficiency    Coronary artery disease    a.) s/p 3v CABG in 1999   Depression    GERD (gastroesophageal reflux disease)    History of kidney stones    Hypercholesteremia    Hypertension    Kidney stone    Low testosterone    on TRT injections   Malignant melanoma ([place])    a.) face, nose, left leg; b.) Tx'd with chemotherapy + XRT at [facility]   Myocardial infarction [facility])    Pulmonary emboli ([place])    S/P CABG x 3 1999   a.) LIMA-LAD, SVG-RCA, SVG-diagnonal   Sleep apnea    not using nocturnal PAP therapy   T2DM (type 2 diabetes mellitus) ([place])    Past Surgical History:  Procedure Laterality Date   CARDIAC CATHETERIZATION     x3     last 1999   CARDIOVERSION N/A 08/12/2021   Procedure: CARDIOVERSION;  Surgeon: Fay Records, MD;  Location: [facility];  Service: Cardiovascular;   Laterality: N/A;   CATARACT EXTRACTION Bilateral    COLONOSCOPY WITH PROPOFOL N/A 09/26/2014   Procedure: COLONOSCOPY WITH PROPOFOL;  Surgeon: [name], MD;  Location: [facility];  Service: Endoscopy;  Laterality: N/A;   CORONARY ARTERY BYPASS GRAFT N/A 1999   3v; LIMA-LAD, SVG-RCA, SVG-diagonal   HOLEP-LASER ENUCLEATION OF THE PROSTATE WITH MORCELLATION N/A 06/07/2021   Procedure: HOLEP-LASER ENUCLEATION OF THE PROSTATE WITH MORCELLATION;  Surgeon: Billey Co, MD;  Location: [facility];  Service: Urology;  Laterality: N/A;   KNEE ARTHROSCOPY Left    LAPAROSCOPIC CHOLECYSTECTOMY     MASS EXCISION  04/15/2012   Procedure: EXCISION MASS;  Surgeon: [name], MD;  Location: [facility];  Service: ENT;  Laterality: Left;  Left tonsil biopsy   melanomia     several skin ca removed-nose, left ankle, parotid gland left, right nodes-being followed annually..   Patient Active Problem List   Diagnosis Date Noted   SDH (subdural hematoma) ([place]) 04/11/2022   Fall (on)(from) sidewalk curb, initial encounter 04/11/2022   History of melanoma excision 04/11/2022   Diabetes mellitus type 2 in obese (  [place]) 04/11/2022   DNR (do not resuscitate) 04/11/2022   Chronic anticoagulation 06/30/2021   Typical atrial flutter ([place]) 06/28/2021   Pure hypercholesterolemia 12/23/2013   Carotid artery disease ([place]) 12/23/2013   Essential hypertension, benign 12/23/2013   Angina decubitus 12/23/2013   Coronary artery disease    Cancer ([place])    Depression    Pulmonary emboli ([facility])    Hypercholesteremia    Kidney stone    Chronic renal insufficiency     ONSET DATE: 04/11/2022 (referral=11/06/22)  REFERRING DIAG: S06.5XAA- Traumatic subdural hemorrhage with loss of consciousness   THERAPY DIAG: Cognitive communication deficit  Rationale for Evaluation and Treatment: Rehabilitation  SUBJECTIVE:   SUBJECTIVE STATEMENT: "I got here at 8:10 and the parking lot was empty" Pt accompanied by:  self  PERTINENT HISTORY: 77 y.o. male presents to [facility] hospital on 7/14 after falling. Pt found to have small parafalcine SDH and right tentorial SDH , reporting nausea, HA, and dizziness. PMH:  includes anxiety, aflutter, depression, HTN, PE, CABG, DMII.   PAIN: Are you having pain? No  FALLS: Has patient fallen in last 6 months?  Yes, Number of falls: 3-4 (tripping)  PATIENT GOALS: "I want to be able to figure things out on my own. I want to confidently talk with people and not feel like I have to run and hide "   OBJECTIVE:   TODAY'S TREATMENT:                                                                                                                                          11-28-22: Pt reported initial early arrival nearly 1.5 hours ahead for 9:30 appointment, in which pt was cued of error by empty parking lot. Re-directed patient to calendar and visual aids indicating appointment time. Of note, pt had check marked the appointment time and needed items on his handout, indicating initial awareness but reduced delayed recall. Verbal cues provided to slow rate to optimize attention for locating calendar in midst of paperwork, with second attempt required. Usual mod A required to use calendar this date. SLP utilized visual cues to optimize attention and recall for orientation (crossing out past dates, highlighting information). Recommended 3-ring binder for improved organization of paperwork for improved attention and recall.  Targeted medication management task with his medication list and his empty bill box. Pt able to verbalize appropriate placement and amounts with 90% accuracy. Intermittent fading to rare cues required to read entirety or instructions. Cued visual markers were beneficial to aid pt attention of med list (check marks and paper guide).  11-25-22: Reported recent personal event which was distressing patient. SLP re-educated impact of internal/external distractions on attention and  how to manage to reduce impact on focused attention. Today, pt required occasional min-mod A and extra processing time to re-direct attention back to conversation x3 today. Pt exhibiting some slow improvements in safety awareness with  use of trained techniques, with pt reporting locking barn d/t safety concern. Some discrepancies in appointments and disorientation exhibited. SLP cued patient to locate calendar, in which pt flipped past calendar x2 indicating reduced awareness and seemingly decreased attention/recall of target. Max A required to locate calendar. Min A required to ID relevant information on calendar. Pt reportedly managing his medications independently. Told SLP he bought extra pill box to fill out two weeks at a time. Based on patient reduced attention to detail and impaired error awareness, facilitated task to verbally sort novel medications x3. Usual re-reading of pill bottle required to aid recall of instructions, with usual mod to max A required to ID placement given unfamiliar pill box. Intermittent mod cues required to place previous bottle out of the way to reduce repetition. Requested pt bring his typed list of current medications and new empty pill box to practice next session given functional implications.   11-21-22: Successfully utilized to-do lists to aid recall and task completion, with pt completing 4/4 items on to-do list. Some mild improvements in safety awareness noted with pt recognizing safety concern x1 without A. For additional situations, max A required to ID safety concerns (ex: jacking up loft alone) and max A for functional problem solving (ex: only 15 mins between appointments). Intermittent mod A required to use the calendar this date to ID month and date. Occasional deviations in attention noted in conversation, with pt able to self-correct with mild additional processing time and rare cues. Success managing medications and finances reported with visual aids provided by  wife.   11-19-22: Reported success using Goal-Plan-Execute-Review framework with wife modifying his goal to clean out barn. Denied any overt concerns with execution in his review today. Returned with to-do list with both appointments written down. SLP modified visual aid for clarity and ease of use to write daily goals with dates/DOW highlighted. Recommended prioritizing 2-3 goals per day, writing specific goals to aid recall, and checking off when completed. Educated patient on techniques to reduce multi-tasking to optimize task completion and engage patient in active listening as these are identified as concerns with wife at home. Modeling, verbal instruction, and handout provided to aid patient recall and carryover of targeted techniques at home. See patient instructions for detail.   11-14-22: Pt goes by [facility]. Pt reported "I have to think thing through, and that's the hard part right now." SLP encouraged pt to externalize his thought process using visual supports, such as explicitly planning his decisions in writing. SLP facilitated discussion on metacognitive strategy instruction and introduced a goal planning framework for pt to follow. With A from SLP pt able to generate personally relevant goal (cleaning out closet), determine plan for completion, generate strategies to aid in task completion. All written down for pt as reference. SLP provided external aids (daily/weekly planner) to support pt decision-making, planning, task execution and completion, orientation to daily goals, reduction in reported internal distractions. Pt endorsed that he uses a calendar to keep track of appointments and stated he sees the value in implementing a checklist to plan his day. Pt receptive to education and requested copy of external aid to print at home. Provided visual, pt able to reteach strategy back to clinician with occasional min verbal cues.   Difficulty evidenced in determining functional goals but is able to ID  barriers in response to questioning cues. E.g. He enjoys doing yard work, but does not [employer] any longer. Expressed desire to trim trees but acknowledges this may  not be appropriate per physical limitation.  11-12-22: Completed CLQT and PROM this session (see results above). Discussed results and clinical observations, including difficulty following/recalling multi-step instructions, reduced emergent error awareness, and impaired problem solving (I.e., getting stuck during maze, perseveration of designs). Pt reported feeling distracted and worried about finances and his wife managing everything since accident. Re-iterated ST POC established to address personally relevant goals to optimize cognitive functioning at home to reduce caregiver burden and optimize QOL. Today, targeted functional problem solving of a current problem (paying for lawn care). Mod prompting provided to ID functional problem, generate possible solutions, and evaluate and select best solution based on physical and cognitive restrictions. Suggested this framework be utilized at home to aid problem solving, planning, and organization. Provided attention/memory strategy handout to review at home with further education and training required to implement and carryover. Pt reported he easily becomes distracted at home and has difficulty with sustained attention. Pt able to self-correct deviation in attention x1 in conversation today given min extra processing time.   PATIENT EDUCATION: Education details: see above Person educated: Patient Education method: Consulting civil engineer, Demonstration, Verbal cues, and Handouts Education comprehension: verbalized understanding, returned demonstration, and needs further education   GOALS: Goals reviewed with patient? Yes  SHORT TERM GOALS: Target date: 12/05/2022  Complete CLQT and PROM first 1-2 sessions with thorough discussion of results to heighten pt awareness  Baseline:  Goal status:  MET  2.  Pt will follow multi-step instructions to complete functional task with ~90% accuracy given occasional min A for 2/2 opportunities  Baseline: 11-28-22 Goal status: IN PROGRESS  3.  Pt will demonstrate increased attention to detail on structured iADL tasks with 3 or less errors exhibited given occasional min A  Baseline:  Goal status: IN PROGRESS  4.  Pt will demonstrate increased safety awareness by verbalizing problem and solution prior to initiating task for 2/2 opportunities  Baseline: 11-19-22 Goal status: IN PROGRESS  5.   Pt will utilize processing/attention strategies in short structured conversations to complete entirety of thought for 80% of messages given occasional min A  Baseline: 11-19-22, 11-21-22 Goal status: MET  LONG TERM GOALS: Target date: 01/30/2023  Pt will successfully manage iADLs (medications, finances, appointments) with use of memory aids and compensations given rare min A form wife Baseline:  Goal status: IN PROGRESS  2.  Pt will demonstrate improved safety awareness and attention to detail with less frequent A from wife to ID safety concerns and complete functional tasks given rare min A  Baseline:  Goal status: IN PROGRESS  3.  Pt will utilize processing/attention strategies in extended unstructured conversations to complete entirety of thoughts for ~90% of messages given rare min A Baseline:  Goal status: IN PROGRESS  4.  Pt will demonstrate [facility] verbal expression with no evidence of anomia/dysnomia (compensate as needed) in 30+ minute conversations x2 Baseline:  Goal status: IN PROGRESS   ASSESSMENT:  CLINICAL IMPRESSION: Patient is a 77 y.o. male who was seen today for cognitive rehabilitation following SDH from fall in July 2023. Initially reported slower processing, reduced safety awareness ("slow to recognize dangerous situations"), impaired problem solving/organization, and decreased attention impacting his daily life, completion of usual  tasks, and return to prior hobbies. Pt endorsed having some difficulty managing his medications, finances, and participating in conversations due to cognitive deficits. Conducted ongoing education and instruction of memory, attention, and problem solving strategies to optimize cognitive functioning with pertinent tasks. Given significant functional impact and increased frustration for  patient and wife, pt would benefit from skilled ST intervention to optimize cognitive functioning to maximize return to prior baseline.   OBJECTIVE IMPAIRMENTS: include attention, memory, awareness, and executive functioning. These impairments are limiting patient from managing medications, managing finances, and household responsibilities. Factors affecting potential to achieve goals and functional outcome are ability to learn/carryover information. Patient will benefit from skilled SLP services to address above impairments and improve overall function.  REHAB POTENTIAL: Good  PLAN:  SLP FREQUENCY: 2x/week  SLP DURATION: 12 weeks  PLANNED INTERVENTIONS: Language facilitation, Environmental controls, Cueing hierachy, Cognitive reorganization, Internal/external aids, Functional tasks, Multimodal communication approach, SLP instruction and feedback, Compensatory strategies, and Patient/family education    Marzetta Board, CCC-SLP 11/28/2022, 9:37 AM

## 2022-11-28 NOTE — Patient Instructions (Addendum)
Homework:  Get a binder for your speech therapy materials, so we can organize your papers for ease of access. We can hole punch and organize next session.   Print off your medication list from your personal computer. We can compare your list to the list from your doctor's portal  Recommendations:  [place] off the days that have gone by on your calendar (that night or the following morning). Do this on all your calendars to help you find the date easier

## 2022-12-03 ENCOUNTER — Ambulatory Visit: Payer: Worker's Compensation | Attending: Neurological Surgery

## 2022-12-03 DIAGNOSIS — Z85828 Personal history of other malignant neoplasm of skin: Secondary | ICD-10-CM | POA: Diagnosis not present

## 2022-12-03 DIAGNOSIS — L578 Other skin changes due to chronic exposure to nonionizing radiation: Secondary | ICD-10-CM | POA: Diagnosis not present

## 2022-12-03 DIAGNOSIS — Z08 Encounter for follow-up examination after completed treatment for malignant neoplasm: Secondary | ICD-10-CM | POA: Diagnosis not present

## 2022-12-03 DIAGNOSIS — L814 Other melanin hyperpigmentation: Secondary | ICD-10-CM | POA: Diagnosis not present

## 2022-12-03 DIAGNOSIS — Z85820 Personal history of malignant melanoma of skin: Secondary | ICD-10-CM | POA: Diagnosis not present

## 2022-12-03 DIAGNOSIS — L821 Other seborrheic keratosis: Secondary | ICD-10-CM | POA: Diagnosis not present

## 2022-12-03 DIAGNOSIS — R41841 Cognitive communication deficit: Secondary | ICD-10-CM | POA: Insufficient documentation

## 2022-12-03 DIAGNOSIS — Z872 Personal history of diseases of the skin and subcutaneous tissue: Secondary | ICD-10-CM | POA: Diagnosis not present

## 2022-12-03 DIAGNOSIS — L570 Actinic keratosis: Secondary | ICD-10-CM | POA: Diagnosis not present

## 2022-12-03 NOTE — Therapy (Signed)
OUTPATIENT SPEECH LANGUAGE PATHOLOGY TREATMENT   Patient Name: Evan [name] MRN: [id number] DOB:1945-10-11, 77 y.o., male Today's Date: 12/03/2022  PCP: Josetta Huddle MD REFERRING PROVIDER: Eustace Moore MD  END OF SESSION:  End of Session - 12/03/22 0838     Visit Number 8    Number of Visits 25    Date for SLP Re-Evaluation 01/30/23    Authorization Type [employer]    Authorization Time Period 12 ST visits apprd    SLP Start Time 0845    SLP Stop Time  0935    SLP Time Calculation (min) 50 min    Activity Tolerance Patient tolerated treatment well              Past Medical History:  Diagnosis Date   Acquired dilation of ascending aorta and aortic root ([place])    a.) measurements by TTE in 04/2021 --> root 40 mm and ascending aorta 43 mm   Anginal pain ([place])    Anxiety    Atrial flutter ([place]) 04/22/2021   a.) CHADS2VASc = 5 (age x 2, HTN, T2DM, CAD); b.) daily warfarin   Carotid disease, bilateral ([place])    a.) Doppler in 2019 --> 1-39% stenosis on RIGHT and 40-58% on LEFT   Cataracts, bilateral    Chronic anticoagulation    Warfarin   Chronic renal insufficiency    Coronary artery disease    a.) s/p 3v CABG in 1999   Depression    GERD (gastroesophageal reflux disease)    History of kidney stones    Hypercholesteremia    Hypertension    Kidney stone    Low testosterone    on TRT injections   Malignant melanoma ([place])    a.) face, nose, left leg; b.) Tx'd with chemotherapy + X[facility] at [facility]   Myocardial infarction [facility])    Pulmonary emboli ([place])    S/P CABG x 3 1999   a.) LIMA-LAD, SVG-RCA, SVG-diagnonal   Sleep apnea    not using nocturnal PAP therapy   T2DM (type 2 diabetes mellitus) ([place])    Past Surgical History:  Procedure Laterality Date   CARDIAC CATHETERIZATION     x3     last 1999   CARDIOVERSION N/A 08/12/2021   Procedure: CARDIOVERSION;  Surgeon: Fay Records, MD;  Location: [place];  Service: Cardiovascular;   Laterality: N/A;   CATARACT EXTRACTION Bilateral    COLONOSCOPY WITH PROPOFOL N/A 09/26/2014   Procedure: COLONOSCOPY WITH PROPOFOL;  Surgeon: [name], MD;  Location: [facility];  Service: Endoscopy;  Laterality: N/A;   CORONARY ARTERY BYPASS GRAFT N/A 1999   3v; LIMA-LAD, SVG-RCA, SVG-diagonal   HOLEP-LASER ENUCLEATION OF THE PROSTATE WITH MORCELLATION N/A 06/07/2021   Procedure: HOLEP-LASER ENUCLEATION OF THE PROSTATE WITH MORCELLATION;  Surgeon: Billey Co, MD;  Location: [facility];  Service: Urology;  Laterality: N/A;   KNEE ARTHROSCOPY Left    LAPAROSCOPIC CHOLECYSTECTOMY     MASS EXCISION  04/15/2012   Procedure: EXCISION MASS;  Surgeon: [name], MD;  Location: [place];  Service: ENT;  Laterality: Left;  Left tonsil biopsy   melanomia     several skin ca removed-nose, left ankle, parotid gland left, right nodes-being followed annually..   Patient Active Problem List   Diagnosis Date Noted   SDH (subdural hematoma) ([place]) 04/11/2022   Fall (on)(from) sidewalk curb, initial encounter 04/11/2022   History of melanoma excision 04/11/2022   Diabetes mellitus type 2 in obese (  [place]) 04/11/2022   DNR (do not resuscitate) 04/11/2022   Chronic anticoagulation 06/30/2021   Typical atrial flutter ([place]) 06/28/2021   Pure hypercholesterolemia 12/23/2013   Carotid artery disease ([place]) 12/23/2013   Essential hypertension, benign 12/23/2013   Angina decubitus 12/23/2013   Coronary artery disease    Cancer ([place])    Depression    Pulmonary emboli ([facility])    Hypercholesteremia    Kidney stone    Chronic renal insufficiency     ONSET DATE: 04/11/2022 (referral=11/06/22)  REFERRING DIAG: S06.5XAA- Traumatic subdural hemorrhage with loss of consciousness   THERAPY DIAG: Cognitive communication deficit  Rationale for Evaluation and Treatment: Rehabilitation  SUBJECTIVE:   SUBJECTIVE STATEMENT: "I have a skin doctor appointment at 5. I need you to let me out early. I need to get  out 5 minutes before 12" (current time=8:45) Pt accompanied by: self  PERTINENT HISTORY: 77 y.o. male presents to [facility] hospital on 7/14 after falling. Pt found to have small parafalcine SDH and right tentorial SDH , reporting nausea, HA, and dizziness. PMH:  includes anxiety, aflutter, depression, HTN, PE, CABG, DMII.   PAIN: Are you having pain? No  FALLS: Has patient fallen in last 6 months?  Yes, Number of falls: 3-4 (tripping)  PATIENT GOALS: "I want to be able to figure things out on my own. I want to confidently talk with people and not feel like I have to run and hide "   OBJECTIVE:   TODAY'S TREATMENT:                                                                                                                                         12-03-22: Indicated safety concern with pt reportedly rushing to arrive on time to today's appointment. Of note, pt arrived nearly 15 minutes early according to check-in time. Educated patient on safety concerns and more appropriate solutions (ex: call to alert of late arrival versus rush to get here). Overt difficulty with time management and time manipulation demonstrated, as pt reported needing to leave therapy early for appointment over 2 hours in future. Targeted functional time calculations between appointments, with usual max A required to add/subtract time between appointments and to determine appropriate departure times. Visual aids (written time and clock) were mildly beneficial to improve pt understanding and accuracy. As recommended, pt provided binder to organize therapy materials to aid organization, recall, and attention. Errors on calendar noted, in which pt marked out 3 future dates. Pt recognized error with mod verbal prompt. Usual max A required to re-orient to today's date and correct errors. Intermittent mod fading to min A required to locate targeted items within binder during structured practice. Provided time calculations as HEP.    11-28-22:  Pt reported initial early arrival nearly 1.5 hours ahead for 9:30 appointment, in which pt was cued of error by empty parking lot. Re-directed patient to calendar and  visual aids indicating appointment time. Of note, pt had check marked the appointment time and needed items on his handout, indicating initial awareness but reduced delayed recall. Verbal cues provided to slow rate to optimize attention for locating calendar in midst of paperwork, with second attempt required. Usual mod A required to use calendar this date. SLP utilized visual cues to optimize attention and recall for orientation (crossing out past dates, highlighting information). Recommended 3-ring binder for improved organization of paperwork for improved attention and recall.  Targeted medication management task with his medication list and his empty bill box. Pt able to verbalize appropriate placement and amounts with 90% accuracy. Intermittent fading to rare cues required to read entirety or instructions. Cued visual markers were beneficial to aid pt attention of med list (check marks and paper guide).  11-25-22: Reported recent personal event which was distressing patient. SLP re-educated impact of internal/external distractions on attention and how to manage to reduce impact on focused attention. Today, pt required occasional min-mod A and extra processing time to re-direct attention back to conversation x3 today. Pt exhibiting some slow improvements in safety awareness with use of trained techniques, with pt reporting locking barn d/t safety concern. Some discrepancies in appointments and disorientation exhibited. SLP cued patient to locate calendar, in which pt flipped past calendar x2 indicating reduced awareness and seemingly decreased attention/recall of target. Max A required to locate calendar. Min A required to ID relevant information on calendar. Pt reportedly managing his medications independently. Told SLP he bought extra pill box  to fill out two weeks at a time. Based on patient reduced attention to detail and impaired error awareness, facilitated task to verbally sort novel medications x3. Usual re-reading of pill bottle required to aid recall of instructions, with usual mod to max A required to ID placement given unfamiliar pill box. Intermittent mod cues required to place previous bottle out of the way to reduce repetition. Requested pt bring his typed list of current medications and new empty pill box to practice next session given functional implications.   11-21-22: Successfully utilized to-do lists to aid recall and task completion, with pt completing 4/4 items on to-do list. Some mild improvements in safety awareness noted with pt recognizing safety concern x1 without A. For additional situations, max A required to ID safety concerns (ex: jacking up loft alone) and max A for functional problem solving (ex: only 15 mins between appointments). Intermittent mod A required to use the calendar this date to ID month and date. Occasional deviations in attention noted in conversation, with pt able to self-correct with mild additional processing time and rare cues. Success managing medications and finances reported with visual aids provided by wife.   11-19-22: Reported success using Goal-Plan-Execute-Review framework with wife modifying his goal to clean out barn. Denied any overt concerns with execution in his review today. Returned with to-do list with both appointments written down. SLP modified visual aid for clarity and ease of use to write daily goals with dates/DOW highlighted. Recommended prioritizing 2-3 goals per day, writing specific goals to aid recall, and checking off when completed. Educated patient on techniques to reduce multi-tasking to optimize task completion and engage patient in active listening as these are identified as concerns with wife at home. Modeling, verbal instruction, and handout provided to aid patient  recall and carryover of targeted techniques at home. See patient instructions for detail.   11-14-22: Pt goes by [facility]. Pt reported "I have to think thing through, and that's  the hard part right now." SLP encouraged pt to externalize his thought process using visual supports, such as explicitly planning his decisions in writing. SLP facilitated discussion on metacognitive strategy instruction and introduced a goal planning framework for pt to follow. With A from SLP pt able to generate personally relevant goal (cleaning out closet), determine plan for completion, generate strategies to aid in task completion. All written down for pt as reference. SLP provided external aids (daily/weekly planner) to support pt decision-making, planning, task execution and completion, orientation to daily goals, reduction in reported internal distractions. Pt endorsed that he uses a calendar to keep track of appointments and stated he sees the value in implementing a checklist to plan his day. Pt receptive to education and requested copy of external aid to print at home. Provided visual, pt able to reteach strategy back to clinician with occasional min verbal cues.   Difficulty evidenced in determining functional goals but is able to ID barriers in response to questioning cues. E.g. He enjoys doing yard work, but does not [employer] any longer. Expressed desire to trim trees but acknowledges this may not be appropriate per physical limitation.  11-12-22: Completed CLQT and PROM this session (see results above). Discussed results and clinical observations, including difficulty following/recalling multi-step instructions, reduced emergent error awareness, and impaired problem solving (I.e., getting stuck during maze, perseveration of designs). Pt reported feeling distracted and worried about finances and his wife managing everything since accident. Re-iterated ST POC established to address personally relevant goals to optimize  cognitive functioning at home to reduce caregiver burden and optimize QOL. Today, targeted functional problem solving of a current problem (paying for lawn care). Mod prompting provided to ID functional problem, generate possible solutions, and evaluate and select best solution based on physical and cognitive restrictions. Suggested this framework be utilized at home to aid problem solving, planning, and organization. Provided attention/memory strategy handout to review at home with further education and training required to implement and carryover. Pt reported he easily becomes distracted at home and has difficulty with sustained attention. Pt able to self-correct deviation in attention x1 in conversation today given min extra processing time.   PATIENT EDUCATION: Education details: see above Person educated: Patient Education method: Consulting civil engineer, Demonstration, Verbal cues, and Handouts Education comprehension: verbalized understanding, returned demonstration, and needs further education   GOALS: Goals reviewed with patient? Yes  SHORT TERM GOALS: Target date: 12/05/2022  Complete CLQT and PROM first 1-2 sessions with thorough discussion of results to heighten pt awareness  Baseline:  Goal status: MET  2.  Pt will follow multi-step instructions to complete functional task with ~90% accuracy given occasional min A for 2/2 opportunities  Baseline: 11-28-22 Goal status: IN PROGRESS  3.  Pt will demonstrate increased attention to detail on structured iADL tasks with 3 or less errors exhibited given occasional min A  Baseline:  Goal status: IN PROGRESS  4.  Pt will demonstrate increased safety awareness by verbalizing problem and solution prior to initiating task for 2/2 opportunities  Baseline: 11-19-22 Goal status: IN PROGRESS  5.   Pt will utilize processing/attention strategies in short structured conversations to complete entirety of thought for 80% of messages given occasional min A   Baseline: 11-19-22, 11-21-22 Goal status: MET  LONG TERM GOALS: Target date: 01/30/2023  Pt will successfully manage iADLs (medications, finances, appointments) with use of memory aids and compensations given rare min A form wife Baseline:  Goal status: IN PROGRESS  2.  Pt  will demonstrate improved safety awareness and attention to detail with less frequent A from wife to ID safety concerns and complete functional tasks given rare min A  Baseline:  Goal status: IN PROGRESS  3.  Pt will utilize processing/attention strategies in extended unstructured conversations to complete entirety of thoughts for ~90% of messages given rare min A Baseline:  Goal status: IN PROGRESS  4.  Pt will demonstrate [facility] verbal expression with no evidence of anomia/dysnomia (compensate as needed) in 30+ minute conversations x2 Baseline:  Goal status: IN PROGRESS   ASSESSMENT:  CLINICAL IMPRESSION: Patient is a 77 y.o. male who was seen today for cognitive rehabilitation following SDH from fall in July 2023. Conducted ongoing education and instruction of memory, attention, and problem solving strategies to optimize cognitive functioning given inconsistent and often significant difficulty demonstrated. Given significant functional impact and increased frustration for patient and wife, pt would benefit from skilled ST intervention to optimize cognitive functioning to maximize return to prior baseline.   OBJECTIVE IMPAIRMENTS: include attention, memory, awareness, and executive functioning. These impairments are limiting patient from managing medications, managing finances, and household responsibilities. Factors affecting potential to achieve goals and functional outcome are ability to learn/carryover information. Patient will benefit from skilled SLP services to address above impairments and improve overall function.  REHAB POTENTIAL: Good  PLAN:  SLP FREQUENCY: 2x/week  SLP DURATION: 12 weeks  PLANNED  INTERVENTIONS: Language facilitation, Environmental controls, Cueing hierachy, Cognitive reorganization, Internal/external aids, Functional tasks, Multimodal communication approach, SLP instruction and feedback, Compensatory strategies, and Patient/family education    Marzetta Board, CCC-SLP 12/03/2022, 9:44 AM

## 2022-12-03 NOTE — Patient Instructions (Signed)
Safety always comes first!   I notice you are having a hard time calculating time. Writing down to help your calculations is helpful. Check your "Homework" section for the two pages of homework to work on for Friday. You don't have to finish it all by then

## 2022-12-04 NOTE — Therapy (Signed)
OUTPATIENT SPEECH LANGUAGE PATHOLOGY TREATMENT   Patient Name: Evan [name] MRN: [id number] DOB:[birth year], 77 y.o., male Today's Date: 12/05/2022  PCP: Josetta Huddle MD REFERRING PROVIDER: Eustace Moore MD  END OF SESSION:  End of Session - 12/05/22 0846     Visit Number 9    Number of Visits 25    Date for SLP Re-Evaluation 01/30/23    Authorization Type [employer]    Authorization Time Period 12 ST visits apprd    SLP Start Time 0845    SLP Stop Time  0932    SLP Time Calculation (min) 47 min    Activity Tolerance Patient tolerated treatment well               Past Medical History:  Diagnosis Date   Acquired dilation of ascending aorta and aortic root ([place])    a.) measurements by TTE in 04/2021 --> root 40 mm and ascending aorta 43 mm   Anginal pain (HCC)    Anxiety    Atrial flutter ([place]) 04/22/2021   a.) CHADS2VASc = 5 (age x 2, HTN, T2DM, CAD); b.) daily warfarin   Carotid disease, bilateral ([place])    a.) Doppler in 2019 --> 1-39% stenosis on RIGHT and 40-58% on LEFT   Cataracts, bilateral    Chronic anticoagulation    Warfarin   Chronic renal insufficiency    Coronary artery disease    a.) s/p 3v CABG in 1999   Depression    GERD (gastroesophageal reflux disease)    History of kidney stones    Hypercholesteremia    Hypertension    Kidney stone    Low testosterone    on TRT injections   Malignant melanoma ([place])    a.) face, nose, left leg; b.) Tx'd with chemotherapy + XRT at [facility]   Myocardial infarction [facility])    Pulmonary emboli ([place])    S/P CABG x 3 1999   a.) LIMA-LAD, SVG-RCA, SVG-diagnonal   Sleep apnea    not using nocturnal PAP therapy   T2DM (type 2 diabetes mellitus) ([place])    Past Surgical History:  Procedure Laterality Date   CARDIAC CATHETERIZATION     x3     last 1999   CARDIOVERSION N/A 08/12/2021   Procedure: CARDIOVERSION;  Surgeon: Fay Records, MD;  Location: [place];  Service:  Cardiovascular;  Laterality: N/A;   CATARACT EXTRACTION Bilateral    COLONOSCOPY WITH PROPOFOL N/A 09/26/2014   Procedure: COLONOSCOPY WITH PROPOFOL;  Surgeon: [name], MD;  Location: [facility];  Service: Endoscopy;  Laterality: N/A;   CORONARY ARTERY BYPASS GRAFT N/A 1999   3v; LIMA-LAD, SVG-RCA, SVG-diagonal   HOLEP-LASER ENUCLEATION OF THE PROSTATE WITH MORCELLATION N/A 06/07/2021   Procedure: HOLEP-LASER ENUCLEATION OF THE PROSTATE WITH MORCELLATION;  Surgeon: Billey Co, MD;  Location: [facility];  Service: Urology;  Laterality: N/A;   KNEE ARTHROSCOPY Left    LAPAROSCOPIC CHOLECYSTECTOMY     MASS EXCISION  04/15/2012   Procedure: EXCISION MASS;  Surgeon: [name], MD;  Location: [place];  Service: ENT;  Laterality: Left;  Left tonsil biopsy   melanomia     several skin ca removed-nose, left ankle, parotid gland left, right nodes-being followed annually..   Patient Active Problem List   Diagnosis Date Noted   SDH (subdural hematoma) ([place]) 04/11/2022   Fall (on)(from) sidewalk curb, initial encounter 04/11/2022   History of melanoma excision 04/11/2022   Diabetes mellitus type 2 in  obese ([place]) 04/11/2022   DNR (do not resuscitate) 04/11/2022   Chronic anticoagulation 06/30/2021   Typical atrial flutter ([place]) 06/28/2021   Pure hypercholesterolemia 12/23/2013   Carotid artery disease ([place]) 12/23/2013   Essential hypertension, benign 12/23/2013   Angina decubitus 12/23/2013   Coronary artery disease    Cancer ([place])    Depression    Pulmonary emboli (HCC)    Hypercholesteremia    Kidney stone    Chronic renal insufficiency     ONSET DATE: 04/11/2022 (referral=11/06/22)  REFERRING DIAG: S06.5XAA- Traumatic subdural hemorrhage with loss of consciousness   THERAPY DIAG: Cognitive communication deficit  Rationale for Evaluation and Treatment: Rehabilitation  SUBJECTIVE:   SUBJECTIVE STATEMENT: "The binder has been helpful. Very much"  Pt accompanied by:  self  PERTINENT HISTORY: 77 y.o. male presents to [facility] hospital on 7/14 after falling. Pt found to have small parafalcine SDH and right tentorial SDH , reporting nausea, HA, and dizziness. PMH:  includes anxiety, aflutter, depression, HTN, PE, CABG, DMII.   PAIN: Are you having pain? No  FALLS: Has patient fallen in last 6 months?  Yes, Number of falls: 3-4 (tripping)  PATIENT GOALS: "I want to be able to figure things out on my own. I want to confidently talk with people and not feel like I have to run and hide "   OBJECTIVE:   TODAY'S TREATMENT:                                                                                                                                         12-05-22: Improved physical and mental organization exhibited with use of binder. Error noted on to-do list, in which pt able to ID error and correct with min A. Occasional mod A required to appropriately use written aids (to-do lists versus calendar). Completed functional time calculation homework. SLP assisted with double checking hwk. Independently, pt answered time questions with 59% accuracy. With max fading to min verbal/visual cues, accuracy improved to 100%. Visual aids were most effective to optimize understanding, recall, and calculations. Recommended pt double check additional page as part of hwk to heighten error awareness. Double checked/cross referenced personal med list with list provided by MD. Pt able to ID 2 discrepancies with min A. Recommended pt determine discrepancy at home and correct errors as needed on documentation.   12-03-22: Indicated safety concern with pt reportedly rushing to arrive on time to today's appointment. Of note, pt arrived nearly 15 minutes early according to check-in time. Educated patient on safety concerns and more appropriate solutions (ex: call to alert of late arrival versus rush to get here). Overt difficulty with time management and time manipulation demonstrated, as pt reported  needing to leave therapy early for appointment over 2 hours in future. Targeted functional time calculations between appointments, with usual max A required to add/subtract time between appointments and to determine appropriate departure times. Visual  aids (written time and clock) were mildly beneficial to improve pt understanding and accuracy. As recommended, pt provided binder to organize therapy materials to aid organization, recall, and attention. Errors on calendar noted, in which pt marked out 3 future dates. Pt recognized error with mod verbal prompt. Usual max A required to re-orient to today's date and correct errors. Intermittent mod fading to min A required to locate targeted items within binder during structured practice. Provided time calculations as HEP.    11-28-22: Pt reported initial early arrival nearly 1.5 hours ahead for 9:30 appointment, in which pt was cued of error by empty parking lot. Re-directed patient to calendar and visual aids indicating appointment time. Of note, pt had check marked the appointment time and needed items on his handout, indicating initial awareness but reduced delayed recall. Verbal cues provided to slow rate to optimize attention for locating calendar in midst of paperwork, with second attempt required. Usual mod A required to use calendar this date. SLP utilized visual cues to optimize attention and recall for orientation (crossing out past dates, highlighting information). Recommended 3-ring binder for improved organization of paperwork for improved attention and recall.  Targeted medication management task with his medication list and his empty bill box. Pt able to verbalize appropriate placement and amounts with 90% accuracy. Intermittent fading to rare cues required to read entirety or instructions. Cued visual markers were beneficial to aid pt attention of med list (check marks and paper guide).  11-25-22: Reported recent personal event which was distressing  patient. SLP re-educated impact of internal/external distractions on attention and how to manage to reduce impact on focused attention. Today, pt required occasional min-mod A and extra processing time to re-direct attention back to conversation x3 today. Pt exhibiting some slow improvements in safety awareness with use of trained techniques, with pt reporting locking barn d/t safety concern. Some discrepancies in appointments and disorientation exhibited. SLP cued patient to locate calendar, in which pt flipped past calendar x2 indicating reduced awareness and seemingly decreased attention/recall of target. Max A required to locate calendar. Min A required to ID relevant information on calendar. Pt reportedly managing his medications independently. Told SLP he bought extra pill box to fill out two weeks at a time. Based on patient reduced attention to detail and impaired error awareness, facilitated task to verbally sort novel medications x3. Usual re-reading of pill bottle required to aid recall of instructions, with usual mod to max A required to ID placement given unfamiliar pill box. Intermittent mod cues required to place previous bottle out of the way to reduce repetition. Requested pt bring his typed list of current medications and new empty pill box to practice next session given functional implications.   11-21-22: Successfully utilized to-do lists to aid recall and task completion, with pt completing 4/4 items on to-do list. Some mild improvements in safety awareness noted with pt recognizing safety concern x1 without A. For additional situations, max A required to ID safety concerns (ex: jacking up loft alone) and max A for functional problem solving (ex: only 15 mins between appointments). Intermittent mod A required to use the calendar this date to ID month and date. Occasional deviations in attention noted in conversation, with pt able to self-correct with mild additional processing time and rare  cues. Success managing medications and finances reported with visual aids provided by wife.   11-19-22: Reported success using Goal-Plan-Execute-Review framework with wife modifying his goal to clean out barn. Denied any overt concerns with execution  in his review today. Returned with to-do list with both appointments written down. SLP modified visual aid for clarity and ease of use to write daily goals with dates/DOW highlighted. Recommended prioritizing 2-3 goals per day, writing specific goals to aid recall, and checking off when completed. Educated patient on techniques to reduce multi-tasking to optimize task completion and engage patient in active listening as these are identified as concerns with wife at home. Modeling, verbal instruction, and handout provided to aid patient recall and carryover of targeted techniques at home. See patient instructions for detail.   11-14-22: Pt goes by [facility]. Pt reported "I have to think thing through, and that's the hard part right now." SLP encouraged pt to externalize his thought process using visual supports, such as explicitly planning his decisions in writing. SLP facilitated discussion on metacognitive strategy instruction and introduced a goal planning framework for pt to follow. With A from SLP pt able to generate personally relevant goal (cleaning out closet), determine plan for completion, generate strategies to aid in task completion. All written down for pt as reference. SLP provided external aids (daily/weekly planner) to support pt decision-making, planning, task execution and completion, orientation to daily goals, reduction in reported internal distractions. Pt endorsed that he uses a calendar to keep track of appointments and stated he sees the value in implementing a checklist to plan his day. Pt receptive to education and requested copy of external aid to print at home. Provided visual, pt able to reteach strategy back to clinician with occasional min  verbal cues.   Difficulty evidenced in determining functional goals but is able to ID barriers in response to questioning cues. E.g. He enjoys doing yard work, but does not [employer] any longer. Expressed desire to trim trees but acknowledges this may not be appropriate per physical limitation.  11-12-22: Completed CLQT and PROM this session (see results above). Discussed results and clinical observations, including difficulty following/recalling multi-step instructions, reduced emergent error awareness, and impaired problem solving (I.e., getting stuck during maze, perseveration of designs). Pt reported feeling distracted and worried about finances and his wife managing everything since accident. Re-iterated ST POC established to address personally relevant goals to optimize cognitive functioning at home to reduce caregiver burden and optimize QOL. Today, targeted functional problem solving of a current problem (paying for lawn care). Mod prompting provided to ID functional problem, generate possible solutions, and evaluate and select best solution based on physical and cognitive restrictions. Suggested this framework be utilized at home to aid problem solving, planning, and organization. Provided attention/memory strategy handout to review at home with further education and training required to implement and carryover. Pt reported he easily becomes distracted at home and has difficulty with sustained attention. Pt able to self-correct deviation in attention x1 in conversation today given min extra processing time.   PATIENT EDUCATION: Education details: see above Person educated: Patient Education method: Consulting civil engineer, Demonstration, Verbal cues, and Handouts Education comprehension: verbalized understanding, returned demonstration, and needs further education   GOALS: Goals reviewed with patient? Yes  SHORT TERM GOALS: Target date: 12/05/2022  Complete CLQT and PROM first 1-2 sessions with  thorough discussion of results to heighten pt awareness  Baseline:  Goal status: MET  2.  Pt will follow multi-step instructions to complete functional task with ~90% accuracy given occasional min A for 2/2 opportunities  Baseline: 11-28-22 Goal status: PARTIALLY MET  3.  Pt will demonstrate increased attention to detail on structured iADL tasks with 3 or less  errors exhibited given occasional min A  Baseline: 12/05/22 Goal status: PARTIALLY MET  4.  Pt will demonstrate increased safety awareness by verbalizing problem and solution prior to initiating task for 2/2 opportunities  Baseline: 11-19-22 Goal status: PARTIALLY MET  5.   Pt will utilize processing/attention strategies in short structured conversations to complete entirety of thought for 80% of messages given occasional min A  Baseline: 11-19-22, 11-21-22 Goal status: MET  LONG TERM GOALS: Target date: 01/30/2023  Pt will successfully manage iADLs (medications, finances, appointments) with use of memory aids and compensations given rare min A form wife Baseline:  Goal status: IN PROGRESS  2.  Pt will demonstrate improved safety awareness and attention to detail with less frequent A from wife to ID safety concerns and complete functional tasks given rare min A  Baseline:  Goal status: IN PROGRESS  3.  Pt will utilize processing/attention strategies in extended unstructured conversations to complete entirety of thoughts for ~90% of messages given rare min A Baseline:  Goal status: IN PROGRESS  4.  Pt will demonstrate [facility] verbal expression with no evidence of anomia/dysnomia (compensate as needed) in 30+ minute conversations x2 Baseline:  Goal status: IN PROGRESS   ASSESSMENT:  CLINICAL IMPRESSION: Patient is a 77 y.o. male who was seen today for cognitive rehabilitation following SDH from fall in July 2023. Conducted ongoing education and instruction of memory, attention, and problem solving strategies to optimize cognitive  functioning given inconsistent and often significant difficulty demonstrated. Given significant functional impact and increased frustration for patient and wife, pt would benefit from skilled ST intervention to optimize cognitive functioning to maximize return to prior baseline.   OBJECTIVE IMPAIRMENTS: include attention, memory, awareness, and executive functioning. These impairments are limiting patient from managing medications, managing finances, and household responsibilities. Factors affecting potential to achieve goals and functional outcome are ability to learn/carryover information. Patient will benefit from skilled SLP services to address above impairments and improve overall function.  REHAB POTENTIAL: Good  PLAN:  SLP FREQUENCY: 2x/week  SLP DURATION: 12 weeks  PLANNED INTERVENTIONS: Language facilitation, Environmental controls, Cueing hierachy, Cognitive reorganization, Internal/external aids, Functional tasks, Multimodal communication approach, SLP instruction and feedback, Compensatory strategies, and Patient/family education    Marzetta Board, CCC-SLP 12/05/2022, 9:37 AM

## 2022-12-05 ENCOUNTER — Ambulatory Visit: Payer: Worker's Compensation

## 2022-12-05 DIAGNOSIS — Z7901 Long term (current) use of anticoagulants: Secondary | ICD-10-CM | POA: Diagnosis not present

## 2022-12-05 DIAGNOSIS — R41841 Cognitive communication deficit: Secondary | ICD-10-CM

## 2022-12-05 DIAGNOSIS — E291 Testicular hypofunction: Secondary | ICD-10-CM | POA: Diagnosis not present

## 2022-12-05 NOTE — Patient Instructions (Signed)
Homework:  Double check time calculations. Correct any errors. Write down the numbers or use your fingers to help you calculate.  Check the doses of Omega 3 and Warfarin. Correct on your personal med list if wrong    DOUBLE CHECK everything you do (pills, bills, appointments, etc). This is very important!

## 2022-12-10 ENCOUNTER — Ambulatory Visit: Payer: Worker's Compensation | Attending: Neurological Surgery

## 2022-12-10 DIAGNOSIS — R41841 Cognitive communication deficit: Secondary | ICD-10-CM | POA: Diagnosis present

## 2022-12-10 NOTE — Therapy (Signed)
OUTPATIENT SPEECH LANGUAGE PATHOLOGY TREATMENT (PROGRESS NOTE)   Patient Name: Evan [name] MRN: [id number] DOB:06-May-1946, 77 y.o., male Today's Date: 12/10/2022  PCP: Josetta Huddle MD REFERRING PROVIDER: Eustace Moore MD  END OF SESSION:  End of Session - 12/10/22 0845     Visit Number 10    Number of Visits 25    Date for SLP Re-Evaluation 01/30/23    Authorization Type [employer]    Authorization Time Period 12 ST visits apprd    SLP Start Time [phone]    SLP Stop Time  0930    SLP Time Calculation (min) 44 min    Activity Tolerance Patient tolerated treatment well             Speech Therapy Progress Note  Dates of Reporting Period: 11-07-22 to current  Objective Reports of Subjective Statement: Pt has been seen for 10 ST visits targeting cognitive linguistic skills given cognitive changes s/p fall. Pt reporting some improvements in cognitive functioning given ST intervention.   Objective Measurements: Pt presents with slow but increasing improvements in recall, attention, and problem solving. Benefits from explicit instructions, visual aids, and repetition to aid cognitive functioning.   Goal Update: see goals below  Plan: Continue per POC  Reason Skilled Services are Required: Pt would continue to benefit from skilled ST intervention to maximize cognitive linguistic function.    Past Medical History:  Diagnosis Date   Acquired dilation of ascending aorta and aortic root ([place])    a.) measurements by TTE in 04/2021 --> root 40 mm and ascending aorta 43 mm   Anginal pain ([facility])    Anxiety    Atrial flutter ([place]) 04/22/2021   a.) CHADS2VASc = 5 (age x 2, HTN, T2DM, CAD); b.) daily warfarin   Carotid disease, bilateral ([place])    a.) Doppler in 2019 --> 1-39% stenosis on RIGHT and 40-58% on LEFT   Cataracts, bilateral    Chronic anticoagulation    Warfarin   Chronic renal insufficiency    Coronary artery disease    a.) s/p 3v CABG in  1999   Depression    GERD (gastroesophageal reflux disease)    History of kidney stones    Hypercholesteremia    Hypertension    Kidney stone    Low testosterone    on TRT injections   Malignant melanoma ([place])    a.) face, nose, left leg; b.) Tx'd with chemotherapy + X[facility] at [facility]   Myocardial infarction [facility])    Pulmonary emboli ([place])    S/P CABG x 3 1999   a.) LIMA-LAD, SVG-RCA, SVG-diagnonal   Sleep apnea    not using nocturnal PAP therapy   T2DM (type 2 diabetes mellitus) ([place])    Past Surgical History:  Procedure Laterality Date   CARDIAC CATHETERIZATION     x3     last 1999   CARDIOVERSION N/A 08/12/2021   Procedure: CARDIOVERSION;  Surgeon: Fay Records, MD;  Location: [place];  Service: Cardiovascular;  Laterality: N/A;   CATARACT EXTRACTION Bilateral    COLONOSCOPY WITH PROPOFOL N/A 09/26/2014   Procedure: COLONOSCOPY WITH PROPOFOL;  Surgeon: [name], MD;  Location: [facility];  Service: Endoscopy;  Laterality: N/A;   CORONARY ARTERY BYPASS GRAFT N/A 1999   3v; LIMA-LAD, SVG-RCA, SVG-diagonal   HOLEP-LASER ENUCLEATION OF THE PROSTATE WITH MORCELLATION N/A 06/07/2021   Procedure: HOLEP-LASER ENUCLEATION OF THE PROSTATE WITH MORCELLATION;  Surgeon: Billey Co, MD;  Location: [facility];  Service: Urology;  Laterality: N/A;   KNEE ARTHROSCOPY Left    LAPAROSCOPIC CHOLECYSTECTOMY     MASS EXCISION  04/15/2012   Procedure: EXCISION MASS;  Surgeon: [name], MD;  Location: [place];  Service: ENT;  Laterality: Left;  Left tonsil biopsy   melanomia     several skin ca removed-nose, left ankle, parotid gland left, right nodes-being followed annually..   Patient Active Problem List   Diagnosis Date Noted   SDH (subdural hematoma) ([place]) 04/11/2022   Fall (on)(from) sidewalk curb, initial encounter 04/11/2022   History of melanoma excision 04/11/2022   Diabetes mellitus type 2 in obese ([place]) 04/11/2022   DNR (do not resuscitate) 04/11/2022   Chronic  anticoagulation 06/30/2021   Typical atrial flutter ([place]) 06/28/2021   Pure hypercholesterolemia 12/23/2013   Carotid artery disease ([place]) 12/23/2013   Essential hypertension, benign 12/23/2013   Angina decubitus 12/23/2013   Coronary artery disease    Cancer ([place])    Depression    Pulmonary emboli ([place])    Hypercholesteremia    Kidney stone    Chronic renal insufficiency     ONSET DATE: 04/11/2022 (referral=11/06/22)  REFERRING DIAG: S06.5XAA- Traumatic subdural hemorrhage with loss of consciousness   THERAPY DIAG: Cognitive communication deficit  Rationale for Evaluation and Treatment: Rehabilitation  SUBJECTIVE:   SUBJECTIVE STATEMENT: "I completely flunked it" re: hwk Pt accompanied by: self  PERTINENT HISTORY: 77 y.o. male presents to [facility] hospital on 7/14 after falling. Pt found to have small parafalcine SDH and right tentorial SDH , reporting nausea, HA, and dizziness. PMH:  includes anxiety, aflutter, depression, HTN, PE, CABG, DMII.   PAIN: Are you having pain? No  FALLS: Has patient fallen in last 6 months?  Yes, Number of falls: 3-4 (tripping)  PATIENT GOALS: "I want to be able to figure things out on my own. I want to confidently talk with people and not feel like I have to run and hide "   OBJECTIVE:   TODAY'S TREATMENT:                                                                                                                                         12-10-22: Double checked time calculation worksheet for hwk, with 66% accuracy achieved independently. Frequent mod A required to correct errors. Benefited from visual aids (clock and written numbers) and verbal cues to break down calculations. In discussion, some prior difficulty with calculations reported which appears in part to impact current performance. Discussed functional problem solving at home, in which usual prompting required to ID functional solutions. Began targeting error awareness for medication task, in  which usual extended processing time required for comprehension and reduced attention noted. Plan to continue next session.   12-05-22: Improved physical and mental organization exhibited with use of binder. Error noted on to-do list, in which pt able to ID error and correct with min  A. Occasional mod A required to appropriately use written aids (to-do lists versus calendar). Completed functional time calculation homework. SLP assisted with double checking hwk. Independently, pt answered time questions with 59% accuracy. With max fading to min verbal/visual cues, accuracy improved to 100%. Visual aids were most effective to optimize understanding, recall, and calculations. Recommended pt double check additional page as part of hwk to heighten error awareness. Double checked/cross referenced personal med list with list provided by MD. Pt able to ID 2 discrepancies with min A. Recommended pt determine discrepancy at home and correct errors as needed on documentation.   12-03-22: Indicated safety concern with pt reportedly rushing to arrive on time to today's appointment. Of note, pt arrived nearly 15 minutes early according to check-in time. Educated patient on safety concerns and more appropriate solutions (ex: call to alert of late arrival versus rush to get here). Overt difficulty with time management and time manipulation demonstrated, as pt reported needing to leave therapy early for appointment over 2 hours in future. Targeted functional time calculations between appointments, with usual max A required to add/subtract time between appointments and to determine appropriate departure times. Visual aids (written time and clock) were mildly beneficial to improve pt understanding and accuracy. As recommended, pt provided binder to organize therapy materials to aid organization, recall, and attention. Errors on calendar noted, in which pt marked out 3 future dates. Pt recognized error with mod verbal prompt. Usual max  A required to re-orient to today's date and correct errors. Intermittent mod fading to min A required to locate targeted items within binder during structured practice. Provided time calculations as HEP.    11-28-22: Pt reported initial early arrival nearly 1.5 hours ahead for 9:30 appointment, in which pt was cued of error by empty parking lot. Re-directed patient to calendar and visual aids indicating appointment time. Of note, pt had check marked the appointment time and needed items on his handout, indicating initial awareness but reduced delayed recall. Verbal cues provided to slow rate to optimize attention for locating calendar in midst of paperwork, with second attempt required. Usual mod A required to use calendar this date. SLP utilized visual cues to optimize attention and recall for orientation (crossing out past dates, highlighting information). Recommended 3-ring binder for improved organization of paperwork for improved attention and recall.  Targeted medication management task with his medication list and his empty bill box. Pt able to verbalize appropriate placement and amounts with 90% accuracy. Intermittent fading to rare cues required to read entirety or instructions. Cued visual markers were beneficial to aid pt attention of med list (check marks and paper guide).  11-25-22: Reported recent personal event which was distressing patient. SLP re-educated impact of internal/external distractions on attention and how to manage to reduce impact on focused attention. Today, pt required occasional min-mod A and extra processing time to re-direct attention back to conversation x3 today. Pt exhibiting some slow improvements in safety awareness with use of trained techniques, with pt reporting locking barn d/t safety concern. Some discrepancies in appointments and disorientation exhibited. SLP cued patient to locate calendar, in which pt flipped past calendar x2 indicating reduced awareness and seemingly  decreased attention/recall of target. Max A required to locate calendar. Min A required to ID relevant information on calendar. Pt reportedly managing his medications independently. Told SLP he bought extra pill box to fill out two weeks at a time. Based on patient reduced attention to detail and impaired error awareness, facilitated task to verbally  sort novel medications x3. Usual re-reading of pill bottle required to aid recall of instructions, with usual mod to max A required to ID placement given unfamiliar pill box. Intermittent mod cues required to place previous bottle out of the way to reduce repetition. Requested pt bring his typed list of current medications and new empty pill box to practice next session given functional implications.   11-21-22: Successfully utilized to-do lists to aid recall and task completion, with pt completing 4/4 items on to-do list. Some mild improvements in safety awareness noted with pt recognizing safety concern x1 without A. For additional situations, max A required to ID safety concerns (ex: jacking up loft alone) and max A for functional problem solving (ex: only 15 mins between appointments). Intermittent mod A required to use the calendar this date to ID month and date. Occasional deviations in attention noted in conversation, with pt able to self-correct with mild additional processing time and rare cues. Success managing medications and finances reported with visual aids provided by wife.   11-19-22: Reported success using Goal-Plan-Execute-Review framework with wife modifying his goal to clean out barn. Denied any overt concerns with execution in his review today. Returned with to-do list with both appointments written down. SLP modified visual aid for clarity and ease of use to write daily goals with dates/DOW highlighted. Recommended prioritizing 2-3 goals per day, writing specific goals to aid recall, and checking off when completed. Educated patient on techniques  to reduce multi-tasking to optimize task completion and engage patient in active listening as these are identified as concerns with wife at home. Modeling, verbal instruction, and handout provided to aid patient recall and carryover of targeted techniques at home. See patient instructions for detail.   PATIENT EDUCATION: Education details: see above Person educated: Patient Education method: Consulting civil engineer, Demonstration, Verbal cues, and Handouts Education comprehension: verbalized understanding, returned demonstration, and needs further education   GOALS: Goals reviewed with patient? Yes  SHORT TERM GOALS: Target date: 12/05/2022  Complete CLQT and PROM first 1-2 sessions with thorough discussion of results to heighten pt awareness  Baseline:  Goal status: MET  2.  Pt will follow multi-step instructions to complete functional task with ~90% accuracy given occasional min A for 2/2 opportunities  Baseline: 11-28-22 Goal status: PARTIALLY MET  3.  Pt will demonstrate increased attention to detail on structured iADL tasks with 3 or less errors exhibited given occasional min A  Baseline: 12/05/22 Goal status: PARTIALLY MET  4.  Pt will demonstrate increased safety awareness by verbalizing problem and solution prior to initiating task for 2/2 opportunities  Baseline: 11-19-22 Goal status: PARTIALLY MET  5.   Pt will utilize processing/attention strategies in short structured conversations to complete entirety of thought for 80% of messages given occasional min A  Baseline: 11-19-22, 11-21-22 Goal status: MET  LONG TERM GOALS: Target date: 01/30/2023  Pt will successfully manage iADLs (medications, finances, appointments) with use of memory aids and compensations given rare min A form wife Baseline:  Goal status: IN PROGRESS  2.  Pt will demonstrate improved safety awareness and attention to detail with less frequent A from wife to ID safety concerns and complete functional tasks given rare min  A  Baseline:  Goal status: IN PROGRESS  3.  Pt will utilize processing/attention strategies in extended unstructured conversations to complete entirety of thoughts for ~90% of messages given rare min A Baseline:  Goal status: IN PROGRESS  4.  Pt will demonstrate [facility] verbal expression with no  evidence of anomia/dysnomia (compensate as needed) in 30+ minute conversations x2 Baseline:  Goal status: IN PROGRESS   ASSESSMENT:  CLINICAL IMPRESSION: Patient is a 77 y.o. male who was seen today for cognitive rehabilitation following SDH from fall in July 2023. Some mild improvements exhibited thus far. Conducted ongoing education and instruction of memory, attention, and problem solving strategies to optimize cognitive functioning given inconsistent and often significant difficulty demonstrated. Given significant functional impact and increased frustration for patient and wife, pt would benefit from skilled ST intervention to optimize cognitive functioning to maximize return to prior baseline.   OBJECTIVE IMPAIRMENTS: include attention, memory, awareness, and executive functioning. These impairments are limiting patient from managing medications, managing finances, and household responsibilities. Factors affecting potential to achieve goals and functional outcome are ability to learn/carryover information. Patient will benefit from skilled SLP services to address above impairments and improve overall function.  REHAB POTENTIAL: Good  PLAN:  SLP FREQUENCY: 2x/week  SLP DURATION: 12 weeks  PLANNED INTERVENTIONS: Language facilitation, Environmental controls, Cueing hierachy, Cognitive reorganization, Internal/external aids, Functional tasks, Multimodal communication approach, SLP instruction and feedback, Compensatory strategies, and Patient/family education    Marzetta Board, CCC-SLP 12/10/2022, 8:46 AM

## 2022-12-10 NOTE — Therapy (Signed)
OUTPATIENT SPEECH LANGUAGE PATHOLOGY TREATMENT   Patient Name: Evan [name] MRN: [id number] DOB:1946-06-26, 77 y.o., male Today's Date: 12/12/2022  PCP: Josetta Huddle MD REFERRING PROVIDER: Eustace Moore MD  END OF SESSION:  End of Session - 12/12/22 0834     Visit Number 11    Number of Visits 25    Date for SLP Re-Evaluation 01/30/23    Authorization Type [employer]    Authorization Time Period 12 [place] visits apprd    SLP Start Time 0845    SLP Stop Time  0930    SLP Time Calculation (min) 45 min    Activity Tolerance Patient tolerated treatment well                Past Medical History:  Diagnosis Date   Acquired dilation of ascending aorta and aortic root ([place])    a.) measurements by TTE in 04/2021 --> root 40 mm and ascending aorta 43 mm   Anginal pain ([place])    Anxiety    Atrial flutter ([place]) 04/22/2021   a.) CHADS2VASc = 5 (age x 2, HTN, T2DM, CAD); b.) daily warfarin   Carotid disease, bilateral ([place])    a.) Doppler in 2019 --> 1-39% stenosis on RIGHT and 40-58% on LEFT   Cataracts, bilateral    Chronic anticoagulation    Warfarin   Chronic renal insufficiency    Coronary artery disease    a.) s/p 3v CABG in 1999   Depression    GERD (gastroesophageal reflux disease)    History of kidney stones    Hypercholesteremia    Hypertension    Kidney stone    Low testosterone    on TRT injections   Malignant melanoma ([place])    a.) face, nose, left leg; b.) Tx'd with chemotherapy + XRT at [facility]   Myocardial infarction [facility])    Pulmonary emboli ([place])    S/P CABG x 3 1999   a.) LIMA-LAD, SVG-RCA, SVG-diagnonal   Sleep apnea    not using nocturnal PAP therapy   T2DM (type 2 diabetes mellitus) ([place])    Past Surgical History:  Procedure Laterality Date   CARDIAC CATHETERIZATION     x3     last 1999   CARDIOVERSION N/A 08/12/2021   Procedure: CARDIOVERSION;  Surgeon: Fay Records, MD;  Location: [place];  Service:  Cardiovascular;  Laterality: N/A;   CATARACT EXTRACTION Bilateral    COLONOSCOPY WITH PROPOFOL N/A 09/26/2014   Procedure: COLONOSCOPY WITH PROPOFOL;  Surgeon: [name], MD;  Location: [facility];  Service: Endoscopy;  Laterality: N/A;   CORONARY ARTERY BYPASS GRAFT N/A 1999   3v; LIMA-LAD, SVG-RCA, SVG-diagonal   HOLEP-LASER ENUCLEATION OF THE PROSTATE WITH MORCELLATION N/A 06/07/2021   Procedure: HOLEP-LASER ENUCLEATION OF THE PROSTATE WITH MORCELLATION;  Surgeon: Billey Co, MD;  Location: [facility];  Service: Urology;  Laterality: N/A;   KNEE ARTHROSCOPY Left    LAPAROSCOPIC CHOLECYSTECTOMY     MASS EXCISION  04/15/2012   Procedure: EXCISION MASS;  Surgeon: [name], MD;  Location: [place];  Service: ENT;  Laterality: Left;  Left tonsil biopsy   melanomia     several skin ca removed-nose, left ankle, parotid gland left, right nodes-being followed annually..   Patient Active Problem List   Diagnosis Date Noted   SDH (subdural hematoma) ([place]) 04/11/2022   Fall (on)(from) sidewalk curb, initial encounter 04/11/2022   History of melanoma excision 04/11/2022   Diabetes mellitus type 2  in obese ([place]) 04/11/2022   DNR (do not resuscitate) 04/11/2022   Chronic anticoagulation 06/30/2021   Typical atrial flutter ([place]) 06/28/2021   Pure hypercholesterolemia 12/23/2013   Carotid artery disease ([place]) 12/23/2013   Essential hypertension, benign 12/23/2013   Angina decubitus 12/23/2013   Coronary artery disease    Cancer ([place])    Depression    Pulmonary emboli ([facility])    Hypercholesteremia    Kidney stone    Chronic renal insufficiency     ONSET DATE: 04/11/2022 (referral=11/06/22)  REFERRING DIAG: S06.5XAA- Traumatic subdural hemorrhage with loss of consciousness   THERAPY DIAG: Cognitive communication deficit  Rationale for Evaluation and Treatment: Rehabilitation  SUBJECTIVE:   SUBJECTIVE STATEMENT: "What is today's date? Is it March 30th?" Pt accompanied by:  self  PERTINENT HISTORY: 77 y.o. male presents [facility] on 7/14 after falling. Pt found to have small parafalcine SDH and right tentorial SDH , reporting nausea, HA, and dizziness. PMH:  includes anxiety, aflutter, depression, HTN, PE, CABG, DMII.   PAIN: Are you having pain? No  FALLS: Has patient fallen in last 6 months?  Yes, Number of falls: 3-4 (tripping)  PATIENT GOALS: "I want to be able to figure things out on my own. I want to confidently talk with people and not feel like I have to run and hide "   OBJECTIVE:   TODAY'S TREATMENT:                                                                                                                                          12-12-22: Disorientation exhibited upon entrance. SLP provided mod prompting and cues to ID locate correct date using established visual aids in binder. Errors noted on calendar (not marking off days & marking off days in future). Re-education, verbal/visual cues, and written prompt provided to aid recall and carryover for future use. Identified appropriate solution to look up time difference for personal matter. Max A required to calculate time difference between now and in [place]. Re-attempted medication task for error awareness, attention, recall, and problem solving. Pt completed with 50% accuracy independently. Intermittent mod A required to ID errors and min A required to verbally correct errors. Pt would continue to benefit from skilled ST intervention given ongoing cognitive deficits.   12-10-22: Double checked time calculation worksheet for hwk, with 66% accuracy achieved independently. Frequent mod A required to correct errors. Benefited from visual aids (clock and written numbers) and verbal cues to break down calculations. In discussion, some prior difficulty with calculations reported which appears in part to impact current performance. Discussed functional problem solving at home, in which usual prompting required  to ID functional solutions. Began targeting error awareness for medication task, in which usual extended processing time required for comprehension and reduced attention noted. Plan to continue next session.   12-05-22: Improved physical and mental organization exhibited with use of  binder. Error noted on to-do list, in which pt able to ID error and correct with min A. Occasional mod A required to appropriately use written aids (to-do lists versus calendar). Completed functional time calculation homework. SLP assisted with double checking hwk. Independently, pt answered time questions with 59% accuracy. With max fading to min verbal/visual cues, accuracy improved to 100%. Visual aids were most effective to optimize understanding, recall, and calculations. Recommended pt double check additional page as part of hwk to heighten error awareness. Double checked/cross referenced personal med list with list provided by MD. Pt able to ID 2 discrepancies with min A. Recommended pt determine discrepancy at home and correct errors as needed on documentation.   12-03-22: Indicated safety concern with pt reportedly rushing to arrive on time to today's appointment. Of note, pt arrived nearly 15 minutes early according to check-in time. Educated patient on safety concerns and more appropriate solutions (ex: call to alert of late arrival versus rush to get here). Overt difficulty with time management and time manipulation demonstrated, as pt reported needing to leave therapy early for appointment over 2 hours in future. Targeted functional time calculations between appointments, with usual max A required to add/subtract time between appointments and to determine appropriate departure times. Visual aids (written time and clock) were mildly beneficial to improve pt understanding and accuracy. As recommended, pt provided binder to organize therapy materials to aid organization, recall, and attention. Errors on calendar noted, in which  pt marked out 3 future dates. Pt recognized error with mod verbal prompt. Usual max A required to re-orient to today's date and correct errors. Intermittent mod fading to min A required to locate targeted items within binder during structured practice. Provided time calculations as HEP.    11-28-22: Pt reported initial early arrival nearly 1.5 hours ahead for 9:30 appointment, in which pt was cued of error by empty parking lot. Re-directed patient to calendar and visual aids indicating appointment time. Of note, pt had check marked the appointment time and needed items on his handout, indicating initial awareness but reduced delayed recall. Verbal cues provided to slow rate to optimize attention for locating calendar in midst of paperwork, with second attempt required. Usual mod A required to use calendar this date. SLP utilized visual cues to optimize attention and recall for orientation (crossing out past dates, highlighting information). Recommended 3-ring binder for improved organization of paperwork for improved attention and recall.  Targeted medication management task with his medication list and his empty bill box. Pt able to verbalize appropriate placement and amounts with 90% accuracy. Intermittent fading to rare cues required to read entirety or instructions. Cued visual markers were beneficial to aid pt attention of med list (check marks and paper guide).  11-25-22: Reported recent personal event which was distressing patient. SLP re-educated impact of internal/external distractions on attention and how to manage to reduce impact on focused attention. Today, pt required occasional min-mod A and extra processing time to re-direct attention back to conversation x3 today. Pt exhibiting some slow improvements in safety awareness with use of trained techniques, with pt reporting locking barn d/t safety concern. Some discrepancies in appointments and disorientation exhibited. SLP cued patient to locate  calendar, in which pt flipped past calendar x2 indicating reduced awareness and seemingly decreased attention/recall of target. Max A required to locate calendar. Min A required to ID relevant information on calendar. Pt reportedly managing his medications independently. Told SLP he bought extra pill box to fill out two weeks at  a time. Based on patient reduced attention to detail and impaired error awareness, facilitated task to verbally sort novel medications x3. Usual re-reading of pill bottle required to aid recall of instructions, with usual mod to max A required to ID placement given unfamiliar pill box. Intermittent mod cues required to place previous bottle out of the way to reduce repetition. Requested pt bring his typed list of current medications and new empty pill box to practice next session given functional implications.   11-21-22: Successfully utilized to-do lists to aid recall and task completion, with pt completing 4/4 items on to-do list. Some mild improvements in safety awareness noted with pt recognizing safety concern x1 without A. For additional situations, max A required to ID safety concerns (ex: jacking up loft alone) and max A for functional problem solving (ex: only 15 mins between appointments). Intermittent mod A required to use the calendar this date to ID month and date. Occasional deviations in attention noted in conversation, with pt able to self-correct with mild additional processing time and rare cues. Success managing medications and finances reported with visual aids provided by wife.   11-19-22: Reported success using Goal-Plan-Execute-Review framework with wife modifying his goal to clean out barn. Denied any overt concerns with execution in his review today. Returned with to-do list with both appointments written down. SLP modified visual aid for clarity and ease of use to write daily goals with dates/DOW highlighted. Recommended prioritizing 2-3 goals per day, writing  specific goals to aid recall, and checking off when completed. Educated patient on techniques to reduce multi-tasking to optimize task completion and engage patient in active listening as these are identified as concerns with wife at home. Modeling, verbal instruction, and handout provided to aid patient recall and carryover of targeted techniques at home. See patient instructions for detail.   PATIENT EDUCATION: Education details: see above Person educated: Patient Education method: Consulting civil engineer, Demonstration, Verbal cues, and Handouts Education comprehension: verbalized understanding, returned demonstration, and needs further education   GOALS: Goals reviewed with patient? Yes  SHORT TERM GOALS: Target date: 12/05/2022  Complete CLQT and PROM first 1-2 sessions with thorough discussion of results to heighten pt awareness  Baseline:  Goal status: MET  2.  Pt will follow multi-step instructions to complete functional task with ~90% accuracy given occasional min A for 2/2 opportunities  Baseline: 11-28-22 Goal status: PARTIALLY MET  3.  Pt will demonstrate increased attention to detail on structured iADL tasks with 3 or less errors exhibited given occasional min A  Baseline: 12/05/22 Goal status: PARTIALLY MET  4.  Pt will demonstrate increased safety awareness by verbalizing problem and solution prior to initiating task for 2/2 opportunities  Baseline: 11-19-22 Goal status: PARTIALLY MET  5.   Pt will utilize processing/attention strategies in short structured conversations to complete entirety of thought for 80% of messages given occasional min A  Baseline: 11-19-22, 11-21-22 Goal status: MET  LONG TERM GOALS: Target date: 01/30/2023  Pt will successfully manage iADLs (medications, finances, appointments) with use of memory aids and compensations given rare min A form wife Baseline:  Goal status: IN PROGRESS  2.  Pt will demonstrate improved safety awareness and attention to detail with  less frequent A from wife to ID safety concerns and complete functional tasks given rare min A  Baseline:  Goal status: IN PROGRESS  3.  Pt will utilize processing/attention strategies in extended unstructured conversations to complete entirety of thoughts for ~90% of messages given rare min A  Baseline:  Goal status: IN PROGRESS  4.  Pt will demonstrate [facility] verbal expression with no evidence of anomia/dysnomia (compensate as needed) in 30+ minute conversations x2 Baseline:  Goal status: IN PROGRESS   ASSESSMENT:  CLINICAL IMPRESSION: Patient is a 77 y.o. male who was seen today for cognitive rehabilitation following SDH from fall in July 2023. Some mild improvements exhibited thus far. Conducted ongoing education and instruction of memory, attention, and problem solving strategies to optimize cognitive functioning given inconsistent and often significant difficulty demonstrated. Given significant functional impact and increased frustration for patient and wife, pt would benefit from skilled ST intervention to optimize cognitive functioning to maximize return to prior baseline.   OBJECTIVE IMPAIRMENTS: include attention, memory, awareness, and executive functioning. These impairments are limiting patient from managing medications, managing finances, and household responsibilities. Factors affecting potential to achieve goals and functional outcome are ability to learn/carryover information. Patient will benefit from skilled SLP services to address above impairments and improve overall function.  REHAB POTENTIAL: Good  PLAN:  SLP FREQUENCY: 2x/week  SLP DURATION: 12 weeks  PLANNED INTERVENTIONS: Language facilitation, Environmental controls, Cueing hierachy, Cognitive reorganization, Internal/external aids, Functional tasks, Multimodal communication approach, SLP instruction and feedback, Compensatory strategies, and Patient/family education    Marzetta Board, CCC-SLP 12/12/2022,  8:50 AM

## 2022-12-10 NOTE — Patient Instructions (Addendum)
Try calculating times at home. Ex: "I need the computer in 45 minutes. What time will I need the computer?" Use a clock or pen/paper to figure out these calculations if needed   If you encounter a problem, try to find a solution! You might think of a few solutions; figure out the BEST solution.

## 2022-12-11 DIAGNOSIS — R41841 Cognitive communication deficit: Secondary | ICD-10-CM | POA: Diagnosis present

## 2022-12-11 DIAGNOSIS — Z7901 Long term (current) use of anticoagulants: Secondary | ICD-10-CM | POA: Diagnosis not present

## 2022-12-12 ENCOUNTER — Ambulatory Visit: Payer: Worker's Compensation

## 2022-12-12 DIAGNOSIS — R41841 Cognitive communication deficit: Secondary | ICD-10-CM

## 2022-12-12 NOTE — Patient Instructions (Addendum)
We figured out [place] is 5 hours ahead of [place]  Check your "clock" app on your phone and you can see what time it is in [place]   I am happy you asked for written instructions from your doctor to help you remember!   Double check to check for errors, not just to affirm you are right

## 2022-12-15 NOTE — Therapy (Unsigned)
OUTPATIENT SPEECH LANGUAGE PATHOLOGY TREATMENT   Patient Name: Evan [name] MRN: [id number] DOB:02-20-1946, 77 y.o., male Today's Date: 12/17/2022  PCP: Josetta Huddle MD REFERRING PROVIDER: Eustace Moore MD  END OF SESSION:  End of Session - 12/17/22 0840     Visit Number 12    Number of Visits 25    Date for SLP Re-Evaluation 01/30/23    Authorization Type [employer]    Authorization Time Period 12 Hand visits apprd    Authorization - Visit Number 11    Authorization - Number of Visits 12    SLP Start Time [phone]    SLP Stop Time  0932    SLP Time Calculation (min) 48 min    Activity Tolerance Patient tolerated treatment well                 Past Medical History:  Diagnosis Date   Acquired dilation of ascending aorta and aortic root ([place])    a.) measurements by TTE in 04/2021 --> root 40 mm and ascending aorta 43 mm   Anginal pain (H[facility])    Anxiety    Atrial flutter ([place]) 04/22/2021   a.) CHADS2VASc = 5 (age x 2, HTN, T2DM, CAD); b.) daily warfarin   Carotid disease, bilateral ([place])    a.) Doppler in 2019 --> 1-39% stenosis on RIGHT and 40-58% on LEFT   Cataracts, bilateral    Chronic anticoagulation    Warfarin   Chronic renal insufficiency    Coronary artery disease    a.) s/p 3v CABG in 1999   Depression    GERD (gastroesophageal reflux disease)    History of kidney stones    Hypercholesteremia    Hypertension    Kidney stone    Low testosterone    on TRT injections   Malignant melanoma ([place])    a.) face, nose, left leg; b.) Tx'd with chemotherapy + XRT at [facility]   Myocardial infarction [facility])    Pulmonary emboli ([place])    S/P CABG x 3 1999   a.) LIMA-LAD, SVG-RCA, SVG-diagnonal   Sleep apnea    not using nocturnal PAP therapy   T2DM (type 2 diabetes mellitus) ([place])    Past Surgical History:  Procedure Laterality Date   CARDIAC CATHETERIZATION     x3     last 1999   CARDIOVERSION N/A 08/12/2021   Procedure:  CARDIOVERSION;  Surgeon: Fay Records, MD;  Location: [place];  Service: Cardiovascular;  Laterality: N/A;   CATARACT EXTRACTION Bilateral    COLONOSCOPY WITH PROPOFOL N/A 09/26/2014   Procedure: COLONOSCOPY WITH PROPOFOL;  Surgeon: [name], MD;  Location: [facility];  Service: Endoscopy;  Laterality: N/A;   CORONARY ARTERY BYPASS GRAFT N/A 1999   3v; LIMA-LAD, SVG-RCA, SVG-diagonal   HOLEP-LASER ENUCLEATION OF THE PROSTATE WITH MORCELLATION N/A 06/07/2021   Procedure: HOLEP-LASER ENUCLEATION OF THE PROSTATE WITH MORCELLATION;  Surgeon: Billey Co, MD;  Location: [facility];  Service: Urology;  Laterality: N/A;   KNEE ARTHROSCOPY Left    LAPAROSCOPIC CHOLECYSTECTOMY     MASS EXCISION  04/15/2012   Procedure: EXCISION MASS;  Surgeon: [name], MD;  Location: [place];  Service: ENT;  Laterality: Left;  Left tonsil biopsy   melanomia     several skin ca removed-nose, left ankle, parotid gland left, right nodes-being followed annually..   Patient Active Problem List   Diagnosis Date Noted   SDH (subdural hematoma) ([place]) 04/11/2022   Fall (on)(from)  sidewalk curb, initial encounter 04/11/2022   History of melanoma excision 04/11/2022   Diabetes mellitus type 2 in obese ([place]) 04/11/2022   DNR (do not resuscitate) 04/11/2022   Chronic anticoagulation 06/30/2021   Typical atrial flutter ([place]) 06/28/2021   Pure hypercholesterolemia 12/23/2013   Carotid artery disease ([place]) 12/23/2013   Essential hypertension, benign 12/23/2013   Angina decubitus 12/23/2013   Coronary artery disease    Cancer ([place])    Depression    Pulmonary emboli (HCC)    Hypercholesteremia    Kidney stone    Chronic renal insufficiency     ONSET DATE: 04/11/2022 (referral=11/06/22)  REFERRING DIAG: S06.5XAA- Traumatic subdural hemorrhage with loss of consciousness   THERAPY DIAG: Cognitive communication deficit  Rationale for Evaluation and Treatment: Rehabilitation  SUBJECTIVE:   SUBJECTIVE  STATEMENT: "I'm sort of jumbled up this morning" Pt accompanied by: self  PERTINENT HISTORY: 77 y.o. male presents to [facility] hospital on 7/14 after falling. Pt found to have small parafalcine SDH and right tentorial SDH , reporting nausea, HA, and dizziness. PMH:  includes anxiety, aflutter, depression, HTN, PE, CABG, DMII.   PAIN: Are you having pain? No  FALLS: Has patient fallen in last 6 months?  Yes, Number of falls: 3-4 (tripping)  PATIENT GOALS: "I want to be able to figure things out on my own. I want to confidently talk with people and not feel like I have to run and hide "   OBJECTIVE:   TODAY'S TREATMENT:                                                                                                                                          12-17-22: Pt has demonstrated increased ability to manage medications more effectively with revision of updated personal medication list and double checking per SLP recommendations. Reported progress with memory but endorsed difficulty in conversation, particularly over the phone. Deduced difficulty stems from hearing impairment and auditory processing/comprehension. Targeted functional problem solving to address hearing impairment with use of visual aid. With usual prompting to deduce possible solutions, pt able to ID best solution (see audiologist). Pt able to sequence appropriate next steps with usual min A. SLP initiated education and instruction of attention/auditory comprehension strategies to aid attention, recall, and understanding in conversation. Pt would benefit from additional training to aid receptive and expressive language as pt noted with intermittent difficulty with word retrieval and answering SLP questions in conversation today.   12-12-22: Disorientation exhibited upon entrance. SLP provided mod prompting and cues to ID locate correct date using established visual aids in binder. Errors noted on calendar (not marking off days & marking off  days in future). Re-education, verbal/visual cues, and written prompt provided to aid recall and carryover for future use. Identified appropriate solution to look up time difference for personal matter. Max A required to calculate time difference between now and in [place]. Re-attempted  medication task for error awareness, attention, recall, and problem solving. Pt completed with 50% accuracy independently. Intermittent mod A required to ID errors and min A required to verbally correct errors. Pt would continue to benefit from skilled ST intervention given ongoing cognitive deficits.   12-10-22: Double checked time calculation worksheet for hwk, with 66% accuracy achieved independently. Frequent mod A required to correct errors. Benefited from visual aids (clock and written numbers) and verbal cues to break down calculations. In discussion, some prior difficulty with calculations reported which appears in part to impact current performance. Discussed functional problem solving at home, in which usual prompting required to ID functional solutions. Began targeting error awareness for medication task, in which usual extended processing time required for comprehension and reduced attention noted. Plan to continue next session.   12-05-22: Improved physical and mental organization exhibited with use of binder. Error noted on to-do list, in which pt able to ID error and correct with min A. Occasional mod A required to appropriately use written aids (to-do lists versus calendar). Completed functional time calculation homework. SLP assisted with double checking hwk. Independently, pt answered time questions with 59% accuracy. With max fading to min verbal/visual cues, accuracy improved to 100%. Visual aids were most effective to optimize understanding, recall, and calculations. Recommended pt double check additional page as part of hwk to heighten error awareness. Double checked/cross referenced personal med list with list  provided by MD. Pt able to ID 2 discrepancies with min A. Recommended pt determine discrepancy at home and correct errors as needed on documentation.   12-03-22: Indicated safety concern with pt reportedly rushing to arrive on time to today's appointment. Of note, pt arrived nearly 15 minutes early according to check-in time. Educated patient on safety concerns and more appropriate solutions (ex: call to alert of late arrival versus rush to get here). Overt difficulty with time management and time manipulation demonstrated, as pt reported needing to leave therapy early for appointment over 2 hours in future. Targeted functional time calculations between appointments, with usual max A required to add/subtract time between appointments and to determine appropriate departure times. Visual aids (written time and clock) were mildly beneficial to improve pt understanding and accuracy. As recommended, pt provided binder to organize therapy materials to aid organization, recall, and attention. Errors on calendar noted, in which pt marked out 3 future dates. Pt recognized error with mod verbal prompt. Usual max A required to re-orient to today's date and correct errors. Intermittent mod fading to min A required to locate targeted items within binder during structured practice. Provided time calculations as HEP.    11-28-22: Pt reported initial early arrival nearly 1.5 hours ahead for 9:30 appointment, in which pt was cued of error by empty parking lot. Re-directed patient to calendar and visual aids indicating appointment time. Of note, pt had check marked the appointment time and needed items on his handout, indicating initial awareness but reduced delayed recall. Verbal cues provided to slow rate to optimize attention for locating calendar in midst of paperwork, with second attempt required. Usual mod A required to use calendar this date. SLP utilized visual cues to optimize attention and recall for orientation (crossing  out past dates, highlighting information). Recommended 3-ring binder for improved organization of paperwork for improved attention and recall.  Targeted medication management task with his medication list and his empty bill box. Pt able to verbalize appropriate placement and amounts with 90% accuracy. Intermittent fading to rare cues required to read entirety  or instructions. Cued visual markers were beneficial to aid pt attention of med list (check marks and paper guide).  11-25-22: Reported recent personal event which was distressing patient. SLP re-educated impact of internal/external distractions on attention and how to manage to reduce impact on focused attention. Today, pt required occasional min-mod A and extra processing time to re-direct attention back to conversation x3 today. Pt exhibiting some slow improvements in safety awareness with use of trained techniques, with pt reporting locking barn d/t safety concern. Some discrepancies in appointments and disorientation exhibited. SLP cued patient to locate calendar, in which pt flipped past calendar x2 indicating reduced awareness and seemingly decreased attention/recall of target. Max A required to locate calendar. Min A required to ID relevant information on calendar. Pt reportedly managing his medications independently. Told SLP he bought extra pill box to fill out two weeks at a time. Based on patient reduced attention to detail and impaired error awareness, facilitated task to verbally sort novel medications x3. Usual re-reading of pill bottle required to aid recall of instructions, with usual mod to max A required to ID placement given unfamiliar pill box. Intermittent mod cues required to place previous bottle out of the way to reduce repetition. Requested pt bring his typed list of current medications and new empty pill box to practice next session given functional implications.    PATIENT EDUCATION: Education details: see above Person  educated: Patient Education method: Consulting civil engineer, Demonstration, Verbal cues, and Handouts Education comprehension: verbalized understanding, returned demonstration, and needs further education   GOALS: Goals reviewed with patient? Yes  SHORT TERM GOALS: Target date: 12/05/2022  Complete CLQT and PROM first 1-2 sessions with thorough discussion of results to heighten pt awareness  Baseline:  Goal status: MET  2.  Pt will follow multi-step instructions to complete functional task with ~90% accuracy given occasional min A for 2/2 opportunities  Baseline: 11-28-22 Goal status: PARTIALLY MET  3.  Pt will demonstrate increased attention to detail on structured iADL tasks with 3 or less errors exhibited given occasional min A  Baseline: 12/05/22 Goal status: PARTIALLY MET  4.  Pt will demonstrate increased safety awareness by verbalizing problem and solution prior to initiating task for 2/2 opportunities  Baseline: 11-19-22 Goal status: PARTIALLY MET  5.   Pt will utilize processing/attention strategies in short structured conversations to complete entirety of thought for 80% of messages given occasional min A  Baseline: 11-19-22, 11-21-22 Goal status: MET  LONG TERM GOALS: Target date: 01/30/2023  Pt will successfully manage iADLs (medications, finances, appointments) with use of memory aids and compensations given rare min A form wife Baseline:  Goal status: IN PROGRESS  2.  Pt will demonstrate improved safety awareness and attention to detail with less frequent A from wife to ID safety concerns and complete functional tasks given rare min A  Baseline:  Goal status: IN PROGRESS  3.  Pt will utilize processing/attention strategies in extended unstructured conversations to complete entirety of thoughts for ~90% of messages given rare min A Baseline:  Goal status: IN PROGRESS  4.  Pt will demonstrate [facility] verbal expression with no evidence of anomia/dysnomia (compensate as needed) in 30+  minute conversations x2 Baseline:  Goal status: IN PROGRESS   ASSESSMENT:  CLINICAL IMPRESSION: Patient is a 77 y.o. male who was seen today for cognitive rehabilitation following SDH from fall in July 2023. Conducted ongoing education and instruction of memory, attention, and problem solving strategies to optimize cognitive functioning. Initiated training to aid  pt communication, as pt exhibiting difficulty with expressive and receptive language in conversation. Given significant functional impact and increased frustration for patient and wife, pt would benefit from skilled ST intervention to optimize cognitive functioning to maximize return to prior baseline.   OBJECTIVE IMPAIRMENTS: include attention, memory, awareness, and executive functioning. These impairments are limiting patient from managing medications, managing finances, and household responsibilities. Factors affecting potential to achieve goals and functional outcome are ability to learn/carryover information. Patient will benefit from skilled SLP services to address above impairments and improve overall function.  REHAB POTENTIAL: Good  PLAN:  SLP FREQUENCY: 2x/week  SLP DURATION: 12 weeks  PLANNED INTERVENTIONS: Language facilitation, Environmental controls, Cueing hierachy, Cognitive reorganization, Internal/external aids, Functional tasks, Multimodal communication approach, SLP instruction and feedback, Compensatory strategies, and Patient/family education    Marzetta Board, CCC-SLP 12/17/2022, 9:33 AM

## 2022-12-17 ENCOUNTER — Ambulatory Visit: Payer: Worker's Compensation

## 2022-12-17 DIAGNOSIS — R41841 Cognitive communication deficit: Secondary | ICD-10-CM | POA: Diagnosis not present

## 2022-12-17 NOTE — Patient Instructions (Signed)
Today, we worked on problem solving. We determined that you have a hard time understanding on the telephone because of HEARING and COMPREHENSION. First, we need you to address you hearing. I will teach you how to optimize your comprehension in upcoming sessions.  We wrote out a chart to help understand the next steps to find a solution (see below)          Homework:  Call [facility] and ask about your hearing aid coverage  Find an audiologist that accepts your insurance so that they can check your hearing and help fit you for hearing aids  Schedule an audiology appointment

## 2022-12-18 NOTE — Therapy (Signed)
OUTPATIENT SPEECH LANGUAGE PATHOLOGY TREATMENT   Patient Name: Evan [name] MRN: [id number] DOB:06-05-1946, 77 y.o., male Today's Date: 12/19/2022  PCP: Josetta Huddle MD REFERRING PROVIDER: Eustace Moore MD  END OF SESSION:  End of Session - 12/19/22 0845     Visit Number 13    Number of Visits 25    Date for SLP Re-Evaluation 01/30/23    Authorization Type [employer]    Authorization Time Period 12 [place] visits apprd    Authorization - Number of Visits 12    SLP Start Time 0845    SLP Stop Time  0930    SLP Time Calculation (min) 45 min    Activity Tolerance Patient tolerated treatment well                  Past Medical History:  Diagnosis Date   Acquired dilation of ascending aorta and aortic root ([place])    a.) measurements by TTE in 04/2021 --> root 40 mm and ascending aorta 43 mm   Anginal pain (HCC)    Anxiety    Atrial flutter ([place]) 04/22/2021   a.) CHADS2VASc = 5 (age x 2, HTN, T2DM, CAD); b.) daily warfarin   Carotid disease, bilateral ([place])    a.) Doppler in 2019 --> 1-39% stenosis on RIGHT and 40-58% on LEFT   Cataracts, bilateral    Chronic anticoagulation    Warfarin   Chronic renal insufficiency    Coronary artery disease    a.) s/p 3v CABG in 1999   Depression    GERD (gastroesophageal reflux disease)    History of kidney stones    Hypercholesteremia    Hypertension    Kidney stone    Low testosterone    on TRT injections   Malignant melanoma ([place])    a.) face, nose, left leg; b.) Tx'd with chemotherapy + XRT at [facility]   Myocardial infarction [facility])    Pulmonary emboli ([place])    S/P CABG x 3 1999   a.) LIMA-LAD, SVG-RCA, SVG-diagnonal   Sleep apnea    not using nocturnal PAP therapy   T2DM (type 2 diabetes mellitus) ([place])    Past Surgical History:  Procedure Laterality Date   CARDIAC CATHETERIZATION     x3     last 1999   CARDIOVERSION N/A 08/12/2021   Procedure: CARDIOVERSION;  Surgeon: Fay Records, MD;   Location: [place];  Service: Cardiovascular;  Laterality: N/A;   CATARACT EXTRACTION Bilateral    COLONOSCOPY WITH PROPOFOL N/A 09/26/2014   Procedure: COLONOSCOPY WITH PROPOFOL;  Surgeon: [name], MD;  Location: [facility];  Service: Endoscopy;  Laterality: N/A;   CORONARY ARTERY BYPASS GRAFT N/A 1999   3v; LIMA-LAD, SVG-RCA, SVG-diagonal   HOLEP-LASER ENUCLEATION OF THE PROSTATE WITH MORCELLATION N/A 06/07/2021   Procedure: HOLEP-LASER ENUCLEATION OF THE PROSTATE WITH MORCELLATION;  Surgeon: Billey Co, MD;  Location: [facility];  Service: Urology;  Laterality: N/A;   KNEE ARTHROSCOPY Left    LAPAROSCOPIC CHOLECYSTECTOMY     MASS EXCISION  04/15/2012   Procedure: EXCISION MASS;  Surgeon: [name], MD;  Location: [place];  Service: ENT;  Laterality: Left;  Left tonsil biopsy   melanomia     several skin ca removed-nose, left ankle, parotid gland left, right nodes-being followed annually..   Patient Active Problem List   Diagnosis Date Noted   SDH (subdural hematoma) ([place]) 04/11/2022   Fall (on)(from) sidewalk curb, initial encounter 04/11/2022  History of melanoma excision 04/11/2022   Diabetes mellitus type 2 in obese ([place]) 04/11/2022   DNR (do not resuscitate) 04/11/2022   Chronic anticoagulation 06/30/2021   Typical atrial flutter ([place]) 06/28/2021   Pure hypercholesterolemia 12/23/2013   Carotid artery disease ([place]) 12/23/2013   Essential hypertension, benign 12/23/2013   Angina decubitus 12/23/2013   Coronary artery disease    Cancer ([place])    Depression    Pulmonary emboli (HCC)    Hypercholesteremia    Kidney stone    Chronic renal insufficiency     ONSET DATE: 04/11/2022 (referral=11/06/22)  REFERRING DIAG: S06.5XAA- Traumatic subdural hemorrhage with loss of consciousness   THERAPY DIAG: No diagnosis found.  Rationale for Evaluation and Treatment: Rehabilitation  SUBJECTIVE:   SUBJECTIVE STATEMENT: "I got a hearing appointment"  Pt accompanied  by: self  PERTINENT HISTORY: 77 y.o. male presents to [facility] hospital on 7/14 after falling. Pt found to have small parafalcine SDH and right tentorial SDH , reporting nausea, HA, and dizziness. PMH:  includes anxiety, aflutter, depression, HTN, PE, CABG, DMII.   PAIN: Are you having pain? No  FALLS: Has patient fallen in last 6 months?  Yes, Number of falls: 3-4 (tripping)  PATIENT GOALS: "I want to be able to figure things out on my own. I want to confidently talk with people and not feel like I have to run and hide "   OBJECTIVE:   TODAY'S TREATMENT:                                                                                                                                         12-19-22: Able to demo improved good problem solving, in which pt identified appropriate hearing clinic with appointment scheduled. Cued patient to write appointment on calendar. Min A required to ID correct date (19th vs 29th). Targeted problem solving to determine if this clinic accepts his insurance. Prompted using computer to investigate. Intermittent min cues required to maintain attention to targeted subject. Educated and instructed attention/processing/comprehension strategies to aid patient participation in conversation. Identified strategies x4 to implement at home with wife to repair communication breakdown. Due to time constraints, word finding strategies to be educated and trained next session.    12-17-22: Pt has demonstrated increased ability to manage medications more effectively with revision of updated personal medication list and double checking per SLP recommendations. Reported progress with memory but endorsed difficulty in conversation, particularly over the phone. Deduced difficulty stems from hearing impairment and auditory processing/comprehension. Targeted functional problem solving to address hearing impairment with use of visual aid. With usual prompting to deduce possible solutions, pt able to ID  best solution (see audiologist). Pt able to sequence appropriate next steps with usual min A. SLP initiated education and instruction of attention/auditory comprehension strategies to aid attention, recall, and understanding in conversation. Pt would benefit from additional training to aid receptive and expressive language  as pt noted with intermittent difficulty with word retrieval and answering SLP questions in conversation today.   12-12-22: Disorientation exhibited upon entrance. SLP provided mod prompting and cues to ID locate correct date using established visual aids in binder. Errors noted on calendar (not marking off days & marking off days in future). Re-education, verbal/visual cues, and written prompt provided to aid recall and carryover for future use. Identified appropriate solution to look up time difference for personal matter. Max A required to calculate time difference between now and in [place]. Re-attempted medication task for error awareness, attention, recall, and problem solving. Pt completed with 50% accuracy independently. Intermittent mod A required to ID errors and min A required to verbally correct errors. Pt would continue to benefit from skilled ST intervention given ongoing cognitive deficits.   12-10-22: Double checked time calculation worksheet for hwk, with 66% accuracy achieved independently. Frequent mod A required to correct errors. Benefited from visual aids (clock and written numbers) and verbal cues to break down calculations. In discussion, some prior difficulty with calculations reported which appears in part to impact current performance. Discussed functional problem solving at home, in which usual prompting required to ID functional solutions. Began targeting error awareness for medication task, in which usual extended processing time required for comprehension and reduced attention noted. Plan to continue next session.   12-05-22: Improved physical and mental organization  exhibited with use of binder. Error noted on to-do list, in which pt able to ID error and correct with min A. Occasional mod A required to appropriately use written aids (to-do lists versus calendar). Completed functional time calculation homework. SLP assisted with double checking hwk. Independently, pt answered time questions with 59% accuracy. With max fading to min verbal/visual cues, accuracy improved to 100%. Visual aids were most effective to optimize understanding, recall, and calculations. Recommended pt double check additional page as part of hwk to heighten error awareness. Double checked/cross referenced personal med list with list provided by MD. Pt able to ID 2 discrepancies with min A. Recommended pt determine discrepancy at home and correct errors as needed on documentation.   12-03-22: Indicated safety concern with pt reportedly rushing to arrive on time to today's appointment. Of note, pt arrived nearly 15 minutes early according to check-in time. Educated patient on safety concerns and more appropriate solutions (ex: call to alert of late arrival versus rush to get here). Overt difficulty with time management and time manipulation demonstrated, as pt reported needing to leave therapy early for appointment over 2 hours in future. Targeted functional time calculations between appointments, with usual max A required to add/subtract time between appointments and to determine appropriate departure times. Visual aids (written time and clock) were mildly beneficial to improve pt understanding and accuracy. As recommended, pt provided binder to organize therapy materials to aid organization, recall, and attention. Errors on calendar noted, in which pt marked out 3 future dates. Pt recognized error with mod verbal prompt. Usual max A required to re-orient to today's date and correct errors. Intermittent mod fading to min A required to locate targeted items within binder during structured practice. Provided  time calculations as HEP.    11-28-22: Pt reported initial early arrival nearly 1.5 hours ahead for 9:30 appointment, in which pt was cued of error by empty parking lot. Re-directed patient to calendar and visual aids indicating appointment time. Of note, pt had check marked the appointment time and needed items on his handout, indicating initial awareness but reduced delayed recall.  Verbal cues provided to slow rate to optimize attention for locating calendar in midst of paperwork, with second attempt required. Usual mod A required to use calendar this date. SLP utilized visual cues to optimize attention and recall for orientation (crossing out past dates, highlighting information). Recommended 3-ring binder for improved organization of paperwork for improved attention and recall.  Targeted medication management task with his medication list and his empty bill box. Pt able to verbalize appropriate placement and amounts with 90% accuracy. Intermittent fading to rare cues required to read entirety or instructions. Cued visual markers were beneficial to aid pt attention of med list (check marks and paper guide).  11-25-22: Reported recent personal event which was distressing patient. SLP re-educated impact of internal/external distractions on attention and how to manage to reduce impact on focused attention. Today, pt required occasional min-mod A and extra processing time to re-direct attention back to conversation x3 today. Pt exhibiting some slow improvements in safety awareness with use of trained techniques, with pt reporting locking barn d/t safety concern. Some discrepancies in appointments and disorientation exhibited. SLP cued patient to locate calendar, in which pt flipped past calendar x2 indicating reduced awareness and seemingly decreased attention/recall of target. Max A required to locate calendar. Min A required to ID relevant information on calendar. Pt reportedly managing his medications  independently. Told SLP he bought extra pill box to fill out two weeks at a time. Based on patient reduced attention to detail and impaired error awareness, facilitated task to verbally sort novel medications x3. Usual re-reading of pill bottle required to aid recall of instructions, with usual mod to max A required to ID placement given unfamiliar pill box. Intermittent mod cues required to place previous bottle out of the way to reduce repetition. Requested pt bring his typed list of current medications and new empty pill box to practice next session given functional implications.    PATIENT EDUCATION: Education details: see above Person educated: Patient Education method: Consulting civil engineer, Demonstration, Verbal cues, and Handouts Education comprehension: verbalized understanding, returned demonstration, and needs further education   GOALS: Goals reviewed with patient? Yes  SHORT TERM GOALS: Target date: 12/05/2022  Complete CLQT and PROM first 1-2 sessions with thorough discussion of results to heighten pt awareness  Baseline:  Goal status: MET  2.  Pt will follow multi-step instructions to complete functional task with ~90% accuracy given occasional min A for 2/2 opportunities  Baseline: 11-28-22 Goal status: PARTIALLY MET  3.  Pt will demonstrate increased attention to detail on structured iADL tasks with 3 or less errors exhibited given occasional min A  Baseline: 12/05/22 Goal status: PARTIALLY MET  4.  Pt will demonstrate increased safety awareness by verbalizing problem and solution prior to initiating task for 2/2 opportunities  Baseline: 11-19-22 Goal status: PARTIALLY MET  5.   Pt will utilize processing/attention strategies in short structured conversations to complete entirety of thought for 80% of messages given occasional min A  Baseline: 11-19-22, 11-21-22 Goal status: MET  LONG TERM GOALS: Target date: 01/30/2023  Pt will successfully manage iADLs (medications, finances,  appointments) with use of memory aids and compensations given rare min A form wife Baseline:  Goal status: IN PROGRESS  2.  Pt will demonstrate improved safety awareness and attention to detail with less frequent A from wife to ID safety concerns and complete functional tasks given rare min A  Baseline:  Goal status: IN PROGRESS  3.  Pt will utilize processing/attention strategies in extended unstructured conversations  to complete entirety of thoughts for ~90% of messages given rare min A Baseline:  Goal status: IN PROGRESS  4.  Pt will demonstrate [facility] verbal expression with no evidence of anomia/dysnomia (compensate as needed) in 30+ minute conversations x2 Baseline:  Goal status: IN PROGRESS   ASSESSMENT:  CLINICAL IMPRESSION: Patient is a 77 y.o. male who was seen today for cognitive rehabilitation following SDH from fall in July 2023. Conducted ongoing education and instruction of memory, attention/processing, and problem solving strategies to optimize cognitive functioning. Conducted ongoing training to aid pt communication, as pt exhibiting difficulty with expressive and receptive language in conversation. Given significant functional impact and increased frustration for patient and wife, pt would benefit from skilled ST intervention to optimize cognitive functioning to maximize return to prior baseline.   OBJECTIVE IMPAIRMENTS: include attention, memory, awareness, and executive functioning. These impairments are limiting patient from managing medications, managing finances, and household responsibilities. Factors affecting potential to achieve goals and functional outcome are ability to learn/carryover information. Patient will benefit from skilled SLP services to address above impairments and improve overall function.  REHAB POTENTIAL: Good  PLAN:  SLP FREQUENCY: 2x/week  SLP DURATION: 12 weeks  PLANNED INTERVENTIONS: Language facilitation, Environmental controls, Cueing  hierachy, Cognitive reorganization, Internal/external aids, Functional tasks, Multimodal communication approach, SLP instruction and feedback, Compensatory strategies, and Patient/family education    Marzetta Board, CCC-SLP 12/19/2022, 9:32 AM

## 2022-12-19 ENCOUNTER — Ambulatory Visit: Payer: Worker's Compensation | Attending: Neurological Surgery

## 2022-12-19 DIAGNOSIS — Z7901 Long term (current) use of anticoagulants: Secondary | ICD-10-CM | POA: Diagnosis not present

## 2022-12-19 DIAGNOSIS — E291 Testicular hypofunction: Secondary | ICD-10-CM | POA: Diagnosis not present

## 2022-12-19 DIAGNOSIS — R41841 Cognitive communication deficit: Secondary | ICD-10-CM | POA: Insufficient documentation

## 2022-12-19 NOTE — Patient Instructions (Addendum)
If we get approved for more visits, the front office staff will call you to schedule more speech therapy appointments with me.

## 2022-12-22 DIAGNOSIS — E1122 Type 2 diabetes mellitus with diabetic chronic kidney disease: Secondary | ICD-10-CM | POA: Diagnosis not present

## 2022-12-22 DIAGNOSIS — F331 Major depressive disorder, recurrent, moderate: Secondary | ICD-10-CM | POA: Diagnosis not present

## 2022-12-22 DIAGNOSIS — E291 Testicular hypofunction: Secondary | ICD-10-CM | POA: Diagnosis not present

## 2022-12-22 DIAGNOSIS — G4733 Obstructive sleep apnea (adult) (pediatric): Secondary | ICD-10-CM | POA: Diagnosis not present

## 2022-12-22 DIAGNOSIS — Z7901 Long term (current) use of anticoagulants: Secondary | ICD-10-CM | POA: Diagnosis not present

## 2022-12-22 DIAGNOSIS — I483 Typical atrial flutter: Secondary | ICD-10-CM | POA: Diagnosis not present

## 2022-12-22 DIAGNOSIS — Z86711 Personal history of pulmonary embolism: Secondary | ICD-10-CM | POA: Diagnosis not present

## 2022-12-29 DIAGNOSIS — Z7901 Long term (current) use of anticoagulants: Secondary | ICD-10-CM | POA: Diagnosis not present

## 2023-01-06 DIAGNOSIS — E291 Testicular hypofunction: Secondary | ICD-10-CM | POA: Diagnosis not present

## 2023-01-09 DIAGNOSIS — Z7901 Long term (current) use of anticoagulants: Secondary | ICD-10-CM | POA: Diagnosis not present

## 2023-01-11 ENCOUNTER — Emergency Department (HOSPITAL_BASED_OUTPATIENT_CLINIC_OR_DEPARTMENT_OTHER): Payer: Medicare PPO

## 2023-01-11 ENCOUNTER — Emergency Department (HOSPITAL_BASED_OUTPATIENT_CLINIC_OR_DEPARTMENT_OTHER)
Admission: EM | Admit: 2023-01-11 | Discharge: 2023-01-11 | Disposition: A | Discharge status: Home / Self Care | Payer: Medicare PPO | Attending: Emergency Medicine | Admitting: Emergency Medicine

## 2023-01-11 ENCOUNTER — Other Ambulatory Visit: Payer: Self-pay

## 2023-01-11 DIAGNOSIS — W19XXXA Unspecified fall, initial encounter: Secondary | ICD-10-CM | POA: Insufficient documentation

## 2023-01-11 DIAGNOSIS — R42 Dizziness and giddiness: Secondary | ICD-10-CM | POA: Insufficient documentation

## 2023-01-11 DIAGNOSIS — R29818 Other symptoms and signs involving the nervous system: Secondary | ICD-10-CM | POA: Diagnosis not present

## 2023-01-11 DIAGNOSIS — Z8659 Personal history of other mental and behavioral disorders: Secondary | ICD-10-CM | POA: Diagnosis not present

## 2023-01-11 DIAGNOSIS — R4182 Altered mental status, unspecified: Secondary | ICD-10-CM | POA: Diagnosis not present

## 2023-01-11 DIAGNOSIS — M25551 Pain in right hip: Secondary | ICD-10-CM

## 2023-01-11 DIAGNOSIS — M1611 Unilateral primary osteoarthritis, right hip: Secondary | ICD-10-CM | POA: Diagnosis not present

## 2023-01-11 DIAGNOSIS — Z043 Encounter for examination and observation following other accident: Secondary | ICD-10-CM | POA: Diagnosis not present

## 2023-01-11 DIAGNOSIS — I10 Essential (primary) hypertension: Secondary | ICD-10-CM | POA: Diagnosis not present

## 2023-01-11 DIAGNOSIS — Z87898 Personal history of other specified conditions: Secondary | ICD-10-CM

## 2023-01-11 DIAGNOSIS — Z7901 Long term (current) use of anticoagulants: Secondary | ICD-10-CM | POA: Diagnosis not present

## 2023-01-11 DIAGNOSIS — T1490XA Injury, unspecified, initial encounter: Secondary | ICD-10-CM | POA: Diagnosis not present

## 2023-01-11 LAB — CBC WITH DIFFERENTIAL/PLATELET
Abs Immature Granulocytes: 0.03 10*3/uL (ref 0.00–0.07)
Basophils Absolute: 0 10*3/uL (ref 0.0–0.1)
Basophils Relative: 1 %
Eosinophils Absolute: 0.2 10*3/uL (ref 0.0–0.5)
Eosinophils Relative: 2 %
HCT: 50.7 % (ref 39.0–52.0)
Hemoglobin: 16.5 g/dL (ref 13.0–17.0)
Immature Granulocytes: 0 %
Lymphocytes Relative: 23 %
Lymphs Abs: 1.8 10*3/uL (ref 0.7–4.0)
MCH: 30.1 pg (ref 26.0–34.0)
MCHC: 32.5 g/dL (ref 30.0–36.0)
MCV: 92.3 fL (ref 80.0–100.0)
Monocytes Absolute: 0.6 10*3/uL (ref 0.1–1.0)
Monocytes Relative: 8 %
Neutro Abs: 5.2 10*3/uL (ref 1.7–7.7)
Neutrophils Relative %: 66 %
Platelets: 189 10*3/uL (ref 150–400)
RBC: 5.49 MIL/uL (ref 4.22–5.81)
RDW: 16.5 % — ABNORMAL HIGH (ref 11.5–15.5)
WBC: 7.8 10*3/uL (ref 4.0–10.5)
nRBC: 0 % (ref 0.0–0.2)

## 2023-01-11 LAB — URINALYSIS, MICROSCOPIC (REFLEX)

## 2023-01-11 LAB — URINALYSIS, ROUTINE W REFLEX MICROSCOPIC
Bilirubin Urine: NEGATIVE
Glucose, UA: 250 mg/dL — AB
Hgb urine dipstick: NEGATIVE
Ketones, ur: NEGATIVE mg/dL
Leukocytes,Ua: NEGATIVE
Nitrite: NEGATIVE
Protein, ur: 30 mg/dL — AB
Specific Gravity, Urine: >=1.03 (ref 1.005–1.030)
pH: 5.5 (ref 5.0–8.0)

## 2023-01-11 LAB — BASIC METABOLIC PANEL
Anion gap: 10 (ref 5–15)
BUN: 27 mg/dL — ABNORMAL HIGH (ref 8–23)
CO2: 21 mmol/L — ABNORMAL LOW (ref 22–32)
Calcium: 9.3 mg/dL (ref 8.9–10.3)
Chloride: 105 mmol/L (ref 98–111)
Creatinine, Ser: 1.84 mg/dL — ABNORMAL HIGH (ref 0.61–1.24)
GFR, Estimated: 37 mL/min — ABNORMAL LOW (ref >60)
Glucose, Bld: 122 mg/dL — ABNORMAL HIGH (ref 70–99)
Potassium: 4.6 mmol/L (ref 3.5–5.1)
Sodium: 136 mmol/L (ref 135–145)

## 2023-01-11 LAB — PROTIME-INR
INR: 2.7 — ABNORMAL HIGH (ref 0.8–1.2)
Prothrombin Time: 28.6 seconds — ABNORMAL HIGH (ref 11.4–15.2)

## 2023-01-11 NOTE — ED Notes (Signed)
Pt in radiology 

## 2023-01-11 NOTE — ED Notes (Signed)
Patient transported to CT 

## 2023-01-11 NOTE — ED Provider Notes (Signed)
[place] [facility] AT [facility] Provider Note   CSN: [id number] Arrival date & time: 01/11/23  1618     History  Chief Complaint  Patient presents with   Hip Pain   Fall   Speech Problem    Evan [name] is a 77 y.o. male.  Patient here with some speech issues here the last 3 to 4 days.  He had a fall yesterday and having pain in his right hip.  Having a hard time walking and unable to tell if it is secondary to pain.  He has chronic speech problems from a prior head bleed.  Patient is on Coumadin.  He is on Coumadin for PE history.  He had a fall last night.  Unsure if he hit his head or lose consciousness.  Seems like he landed mostly on his hip.  Overall he is having maybe some dizziness as well.  He denies any chest pain or shortness of breath.  Wife is may be concerned that he has a urinary tract infection.  He denies any pain with urination.  He states that he has been having speech issues and difficulty with getting words out for couple months.  He does not have any issues right now.  No seizure history.  He was walking in the dark and that is what made him fall yesterday.  He thinks he might of hit his right hip and that is why he is having some pain but has been able to ambulate since.  The history is provided by the patient and the spouse.       Home Medications Prior to Admission medications   Medication Sig Start Date End Date Taking? Authorizing Provider  warfarin (COUMADIN) 5 MG tablet Take 0.5-1 tablets (2.5-5 mg total) by mouth See admin instructions. As directed--1/2 tab ( 2.5 mg)  every Monday,Tuesday, and Wednesday; 1 tab  ( 5 mg)  all other days 04/15/22  Yes Sheikh, Omair Latif, DO  acetaminophen (TYLENOL) 325 MG tablet Take 2 tablets (650 mg total) by mouth every 6 (six) hours as needed for mild pain (or Fever >/= 101). 04/12/22   Sheikh, Omair Latif, DO  ANUCORT-HC 25 MG suppository Place 25 mg rectally daily as needed for  hemorrhoids or anal itching. 12/19/20   [provider]  citalopram (CELEXA) 40 MG tablet Take 40 mg by mouth every morning.    [provider]  fenofibrate micronized (LOFIBRA) 67 MG capsule TAKE 1 CAPSULE DAILY (CALL OFFICE TO SCHEDULE YEARLY APPOINTMENT WITH DR. Anne Fu FOR NOVEMBER 2023 BEFORE ANY MORE REFILLS [phone]) 11/24/22   Jake Bathe, MD  fluticasone (CUTIVATE) 0.05 % cream Apply 1 Application topically 2 (two) times daily as needed (rash). 10/22/20   [provider]  glyBURIDE (DIABETA) 5 MG tablet Take 5 mg by mouth every evening.    [provider]  metFORMIN (GLUCOPHAGE-XR) 500 MG 24 hr tablet Take 2 tablets (1,000 mg total) by mouth 2 (two) times daily. 09/26/22   Jake Bathe, MD  metoprolol succinate (TOPROL-XL) 50 MG 24 hr tablet TAKE 1 TABLET DAILY WITH OR IMMEDIATELY FOLLOWING A MEAL 08/29/22   Jake Bathe, MD  nitroGLYCERIN (NITROSTAT) 0.4 MG SL tablet Place 0.4 mg under the tongue every 5 (five) minutes as needed for chest pain. 12/14/20   [provider]  omega-3 acid ethyl esters (LOVAZA) 1 g capsule Take 2 capsules (2 g total) by mouth 2 (two) times daily. 05/28/22   Skains,  Veverly Fells, MD  ondansetron (ZOFRAN) 4 MG tablet Take 1 tablet (4 mg total) by mouth every 6 (six) hours as needed for nausea. 04/12/22   Marguerita Merles Latif, DO  pantoprazole (PROTONIX) 40 MG tablet Take 40 mg by mouth daily.    [provider]  pioglitazone (ACTOS) 30 MG tablet Take 30 mg by mouth every morning.    [provider]  polyethylene glycol (MIRALAX / GLYCOLAX) 17 g packet Take 17 g by mouth daily as needed for mild constipation. 04/12/22   Marguerita Merles Latif, DO  rosuvastatin (CRESTOR) 20 MG tablet Take 20 mg by mouth every evening.    [provider]  valsartan (DIOVAN) 160 MG tablet Take 80 mg by mouth every morning.    [provider]      Allergies    Doxycycline and Penicillins    Review of Systems    Review of Systems  Physical Exam Updated Vital Signs BP (!) 149/81 (BP Location: Left Arm)   Pulse 73   Temp 98 F (36.7 C) (Oral)   Resp 18   Ht  (1.753 m)   Wt 93.9 kg   SpO2 100%   BMI 30.57 kg/m  Physical Exam Vitals and nursing note reviewed.  Constitutional:      General: He is not in acute distress.    Appearance: He is well-developed. He is not ill-appearing.  HENT:     Head: Normocephalic and atraumatic.     Nose: Nose normal.     Mouth/Throat:     Mouth: Mucous membranes are moist.  Eyes:     Extraocular Movements: Extraocular movements intact.     Conjunctiva/sclera: Conjunctivae normal.     Pupils: Pupils are equal, round, and reactive to light.  Cardiovascular:     Rate and Rhythm: Normal rate and regular rhythm.     Pulses: Normal pulses.     Heart sounds: No murmur heard. Pulmonary:     Effort: Pulmonary effort is normal. No respiratory distress.     Breath sounds: Normal breath sounds.  Abdominal:     Palpations: Abdomen is soft.     Tenderness: There is no abdominal tenderness.  Musculoskeletal:        General: Tenderness present. No swelling. Normal range of motion.     Cervical back: Normal range of motion and neck supple.     Comments: Tenderness in the right hip  Skin:    General: Skin is warm and dry.     Capillary Refill: Capillary refill takes less than 2 seconds.  Neurological:     Mental Status: He is alert.     Comments: 5+ out of 5 strength throughout, normal sensation, no drift, normal finger-nose-finger, normal speech, no drift, normal visual fields  Psychiatric:        Mood and Affect: Mood normal.     ED Results / Procedures / Treatments   Labs (all labs ordered are listed, but only abnormal results are displayed) Labs Reviewed  CBC WITH DIFFERENTIAL/PLATELET - Abnormal; Notable for the following components:      Result Value   RDW 16.5 (*)    All other components within normal limits  BASIC METABOLIC PANEL -  Abnormal; Notable for the following components:   CO2 21 (*)    Glucose, Bld 122 (*)    BUN 27 (*)    Creatinine, Ser 1.84 (*)    GFR, Estimated 37 (*)    All other components within normal limits  URINALYSIS, ROUTINE W REFLEX MICROSCOPIC - Abnormal; Notable for the following components:   Glucose, UA 250 (*)    Protein, ur 30 (*)    All other components within normal limits  PROTIME-INR - Abnormal; Notable for the following components:   Prothrombin Time 28.6 (*)    INR 2.7 (*)    All other components within normal limits  URINALYSIS, MICROSCOPIC (REFLEX) - Abnormal; Notable for the following components:   Bacteria, UA RARE (*)    All other components within normal limits    EKG None  Radiology DG Hip Unilat With Pelvis 2-3 Views Right  Result Date: 01/11/2023 CLINICAL DATA:  Fall off bed last night. Multifocal pain more prominently affecting the right hip. EXAM: DG HIP (WITH OR WITHOUT PELVIS) 2-3V RIGHT COMPARISON:  None Available. FINDINGS: No acute fracture of the pelvis or right hip. The femoral head is seated in the acetabulum. Hip joint spaces preserved with minimal degenerative change. Intact bony pelvis and pubic rami. No pubic symphyseal or sacroiliac joint diastasis. Incidental left proximal femoral bone island. IMPRESSION: No fracture or dislocation.  Minimal right hip osteoarthritis. Electronically Signed   By: Narda Rutherford M.D.   On: 01/11/2023 17:51   DG Chest 2 View  Result Date: 01/11/2023 CLINICAL DATA:  Recent fall EXAM: CHEST - 2 VIEW COMPARISON:  Chest x-ray November 24, 2017 FINDINGS: The cardiomediastinal silhouette is unchanged in contour status post median sternotomy and CABG. No focal pulmonary opacity. No pleural effusion or pneumothorax. The visualized upper abdomen is unremarkable. No acute osseous abnormality. IMPRESSION: 1. No acute pulmonary abnormality. 2. Unchanged cardiomegaly status post median sternotomy and CABG. Electronically Signed   By:  Jacob Moores M.D.   On: 01/11/2023 17:49   CT Head Wo Contrast  Result Date: 01/11/2023 CLINICAL DATA:  Neuro deficit, acute, stroke suspected; Polytrauma, blunt EXAM: CT HEAD WITHOUT CONTRAST CT CERVICAL SPINE WITHOUT CONTRAST TECHNIQUE: Multidetector CT imaging of the head and cervical spine was performed following the standard protocol without intravenous contrast. Multiplanar CT image reconstructions of the cervical spine were also generated. RADIATION DOSE REDUCTION: This exam was performed according to the departmental dose-optimization program which includes automated exposure control, adjustment of the mA and/or kV according to patient size and/or use of iterative reconstruction technique. COMPARISON:  04/11/2022, 04/12/2022 FINDINGS: CT HEAD FINDINGS Brain: No evidence of acute infarction, hemorrhage, hydrocephalus, extra-axial collection or mass lesion/mass effect. Scattered low-density changes within the periventricular and subcortical white matter most compatible with chronic microvascular ischemic change. Moderate diffuse cerebral volume loss. Vascular: Atherosclerotic calcifications involving the large vessels of the skull base. No unexpected hyperdense vessel. Skull: Normal. Negative for fracture or focal lesion. Sinuses/Orbits: Unchanged left maxillary sinus mucosal thickening. No acute abnormalities. Other: None. CT CERVICAL SPINE FINDINGS Alignment: Facet joints are aligned without dislocation or traumatic listhesis. Dens and lateral masses are aligned. Skull base and vertebrae: No acute fracture. No primary bone lesion or focal pathologic process. Soft tissues and spinal canal: No prevertebral fluid or swelling. No visible canal hematoma. Disc levels:  Degenerative disc disease of C6-7, unchanged. Upper chest: Negative. Other: Bilateral carotid atherosclerosis. IMPRESSION: 1. No acute intracranial abnormality. 2. No acute fracture or subluxation of the cervical spine. Electronically Signed    By: Duanne Guess D.O.   On: 01/11/2023 17:24   CT Cervical Spine Wo Contrast  Result Date: 01/11/2023 CLINICAL DATA:  Neuro deficit, acute, stroke suspected; Polytrauma, blunt EXAM: CT HEAD WITHOUT CONTRAST CT CERVICAL SPINE WITHOUT CONTRAST TECHNIQUE: Multidetector CT  imaging of the head and cervical spine was performed following the standard protocol without intravenous contrast. Multiplanar CT image reconstructions of the cervical spine were also generated. RADIATION DOSE REDUCTION: This exam was performed according to the departmental dose-optimization program which includes automated exposure control, adjustment of the mA and/or kV according to patient size and/or use of iterative reconstruction technique. COMPARISON:  04/11/2022, 04/12/2022 FINDINGS: CT HEAD FINDINGS Brain: No evidence of acute infarction, hemorrhage, hydrocephalus, extra-axial collection or mass lesion/mass effect. Scattered low-density changes within the periventricular and subcortical white matter most compatible with chronic microvascular ischemic change. Moderate diffuse cerebral volume loss. Vascular: Atherosclerotic calcifications involving the large vessels of the skull base. No unexpected hyperdense vessel. Skull: Normal. Negative for fracture or focal lesion. Sinuses/Orbits: Unchanged left maxillary sinus mucosal thickening. No acute abnormalities. Other: None. CT CERVICAL SPINE FINDINGS Alignment: Facet joints are aligned without dislocation or traumatic listhesis. Dens and lateral masses are aligned. Skull base and vertebrae: No acute fracture. No primary bone lesion or focal pathologic process. Soft tissues and spinal canal: No prevertebral fluid or swelling. No visible canal hematoma. Disc levels:  Degenerative disc disease of C6-7, unchanged. Upper chest: Negative. Other: Bilateral carotid atherosclerosis. IMPRESSION: 1. No acute intracranial abnormality. 2. No acute fracture or subluxation of the cervical spine.  Electronically Signed   By: Duanne Guess D.O.   On: 01/11/2023 17:24    Procedures Procedures    Medications Ordered in ED Medications - No data to display  ED Course/ Medical Decision Making/ A&P                             Medical Decision Making Amount and/or Complexity of Data Reviewed Labs: ordered. Radiology: ordered.   Evan [name] is here with right hip pain, fall, ongoing speech difficulties.  Patient has a history of TBI, PE on Coumadin.  He had a fall last night.  Pain to his right hip.  But has been ambulatory.  He has some bruising to the left side of his face but he has no pain.  He is able to open and close his mouth without any issues.  He does not think he lost consciousness.  Wife states that she has noticed maybe some difficulty with remembering words or getting words out but sounds like this has been an issue for couple months.  But may be more pronounced the last few days.  His neurologic exam is normal.  He has fluent speech.  No aphasia or dysarthria.  He has normal visual fields.  Normal strength and sensation.  Denies any chest pain or shortness of breath.  Family concerned maybe for UTI.  He denies any dizziness.  Differential diagnosis from a traumatic standpoint could be a right hip fracture or head bleed.  Sounds like he has TBI history and does speech therapy in the past and has speech issues.  Sounds like he has had issues with speech for a long time and I do not know if there is really anything you speech wise.  He has very fluent speech here with no aphasia or dysarthria.  I will get a head CT.  Symptoms have been going on for a long time may be more than a few days.  Will get CT of his neck x-ray of the pelvis CT head and chest x-ray to evaluate for traumatic processes.  Will see if there is any evidence of subacute stroke or any changes from  his recent CTs.  Will check basic labs to look for electrolyte abnormality, infectious process.  His vital signs  are normal.  His exam is very reassuring.  CT scans per radiology report unremarkable.  X-rays per my review and interpretation and radiology report with no acute findings.  No fracture.  There is no evidence of stroke or head bleed on head CT.  He has a normal neurological exam.  It is hard to really put together a timeline of some of the speech difficulty he is having at times.  He is not having any currently.  Seems like he has a chronic history of this.  He is already on Coumadin and a lot of medications already to prevent stroke and ultimately shared decision was made to hold off on any further stroke workup.  It is possible these could be very atypical seizures given his TBI history.  He does not drive.  I recommend that he continue seizure precautions.  I will refer him to neurology for further workup but my suspicion is that these are likely chronic issues.  They understand return precautions.  Discharged in good condition.  This chart was dictated using voice recognition software.  Despite best efforts to proofread,  errors can occur which can change the documentation meaning.         Final Clinical Impression(s) / ED Diagnoses Final diagnoses:  Fall, initial encounter  Right hip pain  History of speech problem    Rx / DC Orders ED Discharge Orders          Ordered    Ambulatory referral to Neurology       Comments: An appointment is requested in approximately: 1 week   01/11/23 1801              Virgina Norfolk, DO 01/11/23 1803

## 2023-01-11 NOTE — ED Triage Notes (Addendum)
Pt is accompanied by wife. Reports dizziness , blurred vision and unsteady gait x 3-4 days. Blurred vision began today Wife reports "talking out of head".Speech very exaggerated which wife noticed on Monday  Fell off of bed last night due to inability to get legs up on bed. Reports pain all over esp right hip . States unable to lift right leg up. Pt is on blood thinners. Difficulty finding words

## 2023-01-11 NOTE — Discharge Instructions (Signed)
Recommend Tylenol and ice for pain.  I have referred you to neurology to maybe consider further neurologic workup.

## 2023-01-12 NOTE — Therapy (Unsigned)
OUTPATIENT SPEECH LANGUAGE PATHOLOGY TREATMENT   Patient Name: Evan [name] MRN: [id number] DOB:12-04-1945, 77 y.o., male Today's Date: 01/14/2023  PCP: Marden Noble MD REFERRING PROVIDER: Tia Alert MD  END OF SESSION:  End of Session - 01/14/23 0841     Visit Number 14    Number of Visits 25    Date for SLP Re-Evaluation 01/30/23    Authorization Type [employer]    Authorization Time Period 12 ST addl visits apprd - 12/23/21    SLP Start Time 0845    SLP Stop Time  0931    SLP Time Calculation (min) 46 min    Activity Tolerance Patient tolerated treatment well                   Past Medical History:  Diagnosis Date   Acquired dilation of ascending aorta and aortic root    a.) measurements by TTE in 04/2021 --> root 40 mm and ascending aorta 43 mm   Anginal pain    Anxiety    Atrial flutter 04/22/2021   a.) CHADS2VASc = 5 (age x 2, HTN, T2DM, CAD); b.) daily warfarin   Carotid disease, bilateral    a.) Doppler in 2019 --> 1-39% stenosis on RIGHT and 40-58% on LEFT   Cataracts, bilateral    Chronic anticoagulation    Warfarin   Chronic renal insufficiency    Coronary artery disease    a.) s/p 3v CABG in 1999   Depression    GERD (gastroesophageal reflux disease)    History of kidney stones    Hypercholesteremia    Hypertension    Kidney stone    Low testosterone    on TRT injections   Malignant melanoma    a.) face, nose, left leg; b.) Tx'd with chemotherapy + XRT at [facility]   Myocardial infarction    Pulmonary emboli    S/P CABG x 3 1999   a.) LIMA-LAD, SVG-RCA, SVG-diagnonal   Sleep apnea    not using nocturnal PAP therapy   T2DM (type 2 diabetes mellitus)    Past Surgical History:  Procedure Laterality Date   CARDIAC CATHETERIZATION     x3     last 1999   CARDIOVERSION N/A 08/12/2021   Procedure: CARDIOVERSION;  Surgeon: Pricilla Riffle, MD;  Location: [facility] ENDOSCOPY;  Service: Cardiovascular;  Laterality: N/A;    CATARACT EXTRACTION Bilateral    COLONOSCOPY WITH PROPOFOL N/A 09/26/2014   Procedure: COLONOSCOPY WITH PROPOFOL;  Surgeon: Charolett Bumpers, MD;  Location: [facility] ENDOSCOPY;  Service: Endoscopy;  Laterality: N/A;   CORONARY ARTERY BYPASS GRAFT N/A 1999   3v; LIMA-LAD, SVG-RCA, SVG-diagonal   HOLEP-LASER ENUCLEATION OF THE PROSTATE WITH MORCELLATION N/A 06/07/2021   Procedure: HOLEP-LASER ENUCLEATION OF THE PROSTATE WITH MORCELLATION;  Surgeon: Sondra Come, MD;  Location: [facility] ORS;  Service: Urology;  Laterality: N/A;   KNEE ARTHROSCOPY Left    LAPAROSCOPIC CHOLECYSTECTOMY     MASS EXCISION  04/15/2012   Procedure: EXCISION MASS;  Surgeon: Suzanna Obey, MD;  Location: [facility] OR;  Service: ENT;  Laterality: Left;  Left tonsil biopsy   melanomia     several skin ca removed-nose, left ankle, parotid gland left, right nodes-being followed annually..   Patient Active Problem List   Diagnosis Date Noted   SDH (subdural hematoma) 04/11/2022   Fall (on)(from) sidewalk curb, initial encounter 04/11/2022   History of melanoma excision 04/11/2022   Diabetes mellitus type 2 in obese 04/11/2022  DNR (do not resuscitate) 04/11/2022   Chronic anticoagulation 06/30/2021   Typical atrial flutter 06/28/2021   Pure hypercholesterolemia 12/23/2013   Carotid artery disease 12/23/2013   Essential hypertension, benign 12/23/2013   Angina decubitus 12/23/2013   Coronary artery disease    Cancer    Depression    Pulmonary emboli    Hypercholesteremia    Kidney stone    Chronic renal insufficiency     ONSET DATE: 04/11/2022 (referral=11/06/22)  REFERRING DIAG: S06.5XAA- Traumatic subdural hemorrhage with loss of consciousness   THERAPY DIAG: Cognitive communication deficit  Rationale for Evaluation and Treatment: Rehabilitation  SUBJECTIVE:   SUBJECTIVE STATEMENT: "they took my driver's license" Pt accompanied by: self  PERTINENT HISTORY: 77 y.o. male presents to [facility] hospital on 7/14 after falling. Pt  found to have small parafalcine SDH and right tentorial SDH , reporting nausea, HA, and dizziness. PMH:  includes anxiety, aflutter, depression, HTN, PE, CABG, DMII.   PAIN: Are you having pain? No  FALLS: Has patient fallen in last 6 months?  Yes, Number of falls: 3-4 (tripping)  PATIENT GOALS: "I want to be able to figure things out on my own. I want to confidently talk with people and not feel like I have to run and hide "   OBJECTIVE:   TODAY'S TREATMENT:                                                                                                                                         01-12-23: Returned after nearly 1 month d/t awaiting auth for additional ST visits. In verbal recap of last month, pt reported he "flunked" driving test which resulted in revoking of driver's license. Also reported missing several hearing aid appointments due to life situations. Endorsed good carryover of calendar and memory supports to aid recall, with success indicated. Cued recall of recent ED event, in which pt endorsed possible psychosis. Some difficulty with thought organization and verbal expression noted today in conversation. Conversational speech c/b intermittent vague language and occasional anomia. Pt stated "there are some words I can't get out." Targeted word retrieval strategies and SFA this session to optimize communication effectiveness. Usual mod A required to aid comprehension and carryover.   12-19-22: Able to demo improved good problem solving, in which pt identified appropriate hearing clinic with appointment scheduled. Cued patient to write appointment on calendar. Min A required to ID correct date (19th vs 29th). Targeted problem solving to determine if this clinic accepts his insurance. Prompted using computer to investigate. Intermittent min cues required to maintain attention to targeted subject. Educated and instructed attention/processing/comprehension strategies to aid patient  participation in conversation. Identified strategies x4 to implement at home with wife to repair communication breakdown. Due to time constraints, word finding strategies to be educated and trained next session.    12-17-22: Pt has demonstrated increased ability to manage medications more effectively with revision  of updated personal medication list and double checking per SLP recommendations. Reported progress with memory but endorsed difficulty in conversation, particularly over the phone. Deduced difficulty stems from hearing impairment and auditory processing/comprehension. Targeted functional problem solving to address hearing impairment with use of visual aid. With usual prompting to deduce possible solutions, pt able to ID best solution (see audiologist). Pt able to sequence appropriate next steps with usual min A. SLP initiated education and instruction of attention/auditory comprehension strategies to aid attention, recall, and understanding in conversation. Pt would benefit from additional training to aid receptive and expressive language as pt noted with intermittent difficulty with word retrieval and answering SLP questions in conversation today.   12-12-22: Disorientation exhibited upon entrance. SLP provided mod prompting and cues to ID locate correct date using established visual aids in binder. Errors noted on calendar (not marking off days & marking off days in future). Re-education, verbal/visual cues, and written prompt provided to aid recall and carryover for future use. Identified appropriate solution to look up time difference for personal matter. Max A required to calculate time difference between now and in [place]. Re-attempted medication task for error awareness, attention, recall, and problem solving. Pt completed with 50% accuracy independently. Intermittent mod A required to ID errors and min A required to verbally correct errors. Pt would continue to benefit from skilled ST intervention  given ongoing cognitive deficits.   12-10-22: Double checked time calculation worksheet for hwk, with 66% accuracy achieved independently. Frequent mod A required to correct errors. Benefited from visual aids (clock and written numbers) and verbal cues to break down calculations. In discussion, some prior difficulty with calculations reported which appears in part to impact current performance. Discussed functional problem solving at home, in which usual prompting required to ID functional solutions. Began targeting error awareness for medication task, in which usual extended processing time required for comprehension and reduced attention noted. Plan to continue next session.   12-05-22: Improved physical and mental organization exhibited with use of binder. Error noted on to-do list, in which pt able to ID error and correct with min A. Occasional mod A required to appropriately use written aids (to-do lists versus calendar). Completed functional time calculation homework. SLP assisted with double checking hwk. Independently, pt answered time questions with 59% accuracy. With max fading to min verbal/visual cues, accuracy improved to 100%. Visual aids were most effective to optimize understanding, recall, and calculations. Recommended pt double check additional page as part of hwk to heighten error awareness. Double checked/cross referenced personal med list with list provided by MD. Pt able to ID 2 discrepancies with min A. Recommended pt determine discrepancy at home and correct errors as needed on documentation.   12-03-22: Indicated safety concern with pt reportedly rushing to arrive on time to today's appointment. Of note, pt arrived nearly 15 minutes early according to check-in time. Educated patient on safety concerns and more appropriate solutions (ex: call to alert of late arrival versus rush to get here). Overt difficulty with time management and time manipulation demonstrated, as pt reported needing to  leave therapy early for appointment over 2 hours in future. Targeted functional time calculations between appointments, with usual max A required to add/subtract time between appointments and to determine appropriate departure times. Visual aids (written time and clock) were mildly beneficial to improve pt understanding and accuracy. As recommended, pt provided binder to organize therapy materials to aid organization, recall, and attention. Errors on calendar noted, in which pt marked out 3  future dates. Pt recognized error with mod verbal prompt. Usual max A required to re-orient to today's date and correct errors. Intermittent mod fading to min A required to locate targeted items within binder during structured practice. Provided time calculations as HEP.    11-28-22: Pt reported initial early arrival nearly 1.5 hours ahead for 9:30 appointment, in which pt was cued of error by empty parking lot. Re-directed patient to calendar and visual aids indicating appointment time. Of note, pt had check marked the appointment time and needed items on his handout, indicating initial awareness but reduced delayed recall. Verbal cues provided to slow rate to optimize attention for locating calendar in midst of paperwork, with second attempt required. Usual mod A required to use calendar this date. SLP utilized visual cues to optimize attention and recall for orientation (crossing out past dates, highlighting information). Recommended 3-ring binder for improved organization of paperwork for improved attention and recall.  Targeted medication management task with his medication list and his empty bill box. Pt able to verbalize appropriate placement and amounts with 90% accuracy. Intermittent fading to rare cues required to read entirety or instructions. Cued visual markers were beneficial to aid pt attention of med list (check marks and paper guide).  11-25-22: Reported recent personal event which was distressing patient. SLP  re-educated impact of internal/external distractions on attention and how to manage to reduce impact on focused attention. Today, pt required occasional min-mod A and extra processing time to re-direct attention back to conversation x3 today. Pt exhibiting some slow improvements in safety awareness with use of trained techniques, with pt reporting locking barn d/t safety concern. Some discrepancies in appointments and disorientation exhibited. SLP cued patient to locate calendar, in which pt flipped past calendar x2 indicating reduced awareness and seemingly decreased attention/recall of target. Max A required to locate calendar. Min A required to ID relevant information on calendar. Pt reportedly managing his medications independently. Told SLP he bought extra pill box to fill out two weeks at a time. Based on patient reduced attention to detail and impaired error awareness, facilitated task to verbally sort novel medications x3. Usual re-reading of pill bottle required to aid recall of instructions, with usual mod to max A required to ID placement given unfamiliar pill box. Intermittent mod cues required to place previous bottle out of the way to reduce repetition. Requested pt bring his typed list of current medications and new empty pill box to practice next session given functional implications.    PATIENT EDUCATION: Education details: see above Person educated: Patient Education method: Programmer, multimedia, Demonstration, Verbal cues, and Handouts Education comprehension: verbalized understanding, returned demonstration, and needs further education   GOALS: Goals reviewed with patient? Yes  SHORT TERM GOALS: Target date: 12/05/2022  Complete CLQT and PROM first 1-2 sessions with thorough discussion of results to heighten pt awareness  Baseline:  Goal status: MET  2.  Pt will follow multi-step instructions to complete functional task with ~90% accuracy given occasional min A for 2/2 opportunities   Baseline: 11-28-22 Goal status: PARTIALLY MET  3.  Pt will demonstrate increased attention to detail on structured iADL tasks with 3 or less errors exhibited given occasional min A  Baseline: 12/05/22 Goal status: PARTIALLY MET  4.  Pt will demonstrate increased safety awareness by verbalizing problem and solution prior to initiating task for 2/2 opportunities  Baseline: 11-19-22 Goal status: PARTIALLY MET  5.   Pt will utilize processing/attention strategies in short structured conversations to complete entirety of  thought for 80% of messages given occasional min A  Baseline: 11-19-22, 11-21-22 Goal status: MET  LONG TERM GOALS: Target date: 01/30/2023  Pt will successfully manage iADLs (medications, finances, appointments) with use of memory aids and compensations given rare min A form wife Baseline:  Goal status: IN PROGRESS  2.  Pt will demonstrate improved safety awareness and attention to detail with less frequent A from wife to ID safety concerns and complete functional tasks given rare min A  Baseline:  Goal status: IN PROGRESS  3.  Pt will utilize processing/attention strategies in extended unstructured conversations to complete entirety of thoughts for ~90% of messages given rare min A Baseline:  Goal status: IN PROGRESS  4.  Pt will demonstrate [facility] verbal expression with no evidence of anomia/dysnomia (compensate as needed) in 30+ minute conversations x2 Baseline:  Goal status: IN PROGRESS   ASSESSMENT:  CLINICAL IMPRESSION: Patient is a 77 y.o. male who was seen today for cognitive rehabilitation following SDH from fall in July 2023. Conducted ongoing education and instruction of memory, attention/processing, and problem solving strategies to optimize cognitive functioning. Initiated additional education and training to aid pt communication, as pt exhibiting difficulty with expressive and receptive language in conversation. Given significant functional impact and increased  frustration for patient and wife, pt would benefit from skilled ST intervention to optimize cognitive functioning to maximize return to prior baseline.   OBJECTIVE IMPAIRMENTS: include attention, memory, awareness, and executive functioning. These impairments are limiting patient from managing medications, managing finances, and household responsibilities. Factors affecting potential to achieve goals and functional outcome are ability to learn/carryover information. Patient will benefit from skilled SLP services to address above impairments and improve overall function.  REHAB POTENTIAL: Good  PLAN:  SLP FREQUENCY: 2x/week  SLP DURATION: 12 weeks  PLANNED INTERVENTIONS: Language facilitation, Environmental controls, Cueing hierachy, Cognitive reorganization, Internal/external aids, Functional tasks, Multimodal communication approach, SLP instruction and feedback, Compensatory strategies, and Patient/family education    Gracy Racer, CCC-SLP 01/14/2023, 9:34 AM

## 2023-01-14 ENCOUNTER — Ambulatory Visit: Payer: Worker's Compensation | Attending: Neurological Surgery

## 2023-01-14 DIAGNOSIS — R41841 Cognitive communication deficit: Secondary | ICD-10-CM | POA: Diagnosis present

## 2023-01-14 NOTE — Patient Instructions (Signed)

## 2023-01-17 DIAGNOSIS — R41841 Cognitive communication deficit: Secondary | ICD-10-CM | POA: Diagnosis present

## 2023-01-18 NOTE — Therapy (Deleted)
OUTPATIENT SPEECH LANGUAGE PATHOLOGY TREATMENT   Patient Name: Evan [name] MRN: [id number] DOB:May 01, 1946, 77 y.o., male Today's Date: 01/18/2023  PCP: Marden Noble MD REFERRING PROVIDER: Tia Alert MD  END OF SESSION:          Past Medical History:  Diagnosis Date   Acquired dilation of ascending aorta and aortic root    a.) measurements by TTE in 04/2021 --> root 40 mm and ascending aorta 43 mm   Anginal pain    Anxiety    Atrial flutter 04/22/2021   a.) CHADS2VASc = 5 (age x 2, HTN, T2DM, CAD); b.) daily warfarin   Carotid disease, bilateral    a.) Doppler in 2019 --> 1-39% stenosis on RIGHT and 40-58% on LEFT   Cataracts, bilateral    Chronic anticoagulation    Warfarin   Chronic renal insufficiency    Coronary artery disease    a.) s/p 3v CABG in 1999   Depression    GERD (gastroesophageal reflux disease)    History of kidney stones    Hypercholesteremia    Hypertension    Kidney stone    Low testosterone    on TRT injections   Malignant melanoma    a.) face, nose, left leg; b.) Tx'd with chemotherapy + XRT at [facility]   Myocardial infarction    Pulmonary emboli    S/P CABG x 3 1999   a.) LIMA-LAD, SVG-RCA, SVG-diagnonal   Sleep apnea    not using nocturnal PAP therapy   T2DM (type 2 diabetes mellitus)    Past Surgical History:  Procedure Laterality Date   CARDIAC CATHETERIZATION     x3     last 1999   CARDIOVERSION N/A 08/12/2021   Procedure: CARDIOVERSION;  Surgeon: Pricilla Riffle, MD;  Location: [facility] ENDOSCOPY;  Service: Cardiovascular;  Laterality: N/A;   CATARACT EXTRACTION Bilateral    COLONOSCOPY WITH PROPOFOL N/A 09/26/2014   Procedure: COLONOSCOPY WITH PROPOFOL;  Surgeon: Charolett Bumpers, MD;  Location: [facility] ENDOSCOPY;  Service: Endoscopy;  Laterality: N/A;   CORONARY ARTERY BYPASS GRAFT N/A 1999   3v; LIMA-LAD, SVG-RCA, SVG-diagonal   HOLEP-LASER ENUCLEATION OF THE PROSTATE WITH MORCELLATION N/A 06/07/2021   Procedure: HOLEP-LASER  ENUCLEATION OF THE PROSTATE WITH MORCELLATION;  Surgeon: Sondra Come, MD;  Location: [facility] ORS;  Service: Urology;  Laterality: N/A;   KNEE ARTHROSCOPY Left    LAPAROSCOPIC CHOLECYSTECTOMY     MASS EXCISION  04/15/2012   Procedure: EXCISION MASS;  Surgeon: Suzanna Obey, MD;  Location: [facility] OR;  Service: ENT;  Laterality: Left;  Left tonsil biopsy   melanomia     several skin ca removed-nose, left ankle, parotid gland left, right nodes-being followed annually..   Patient Active Problem List   Diagnosis Date Noted   SDH (subdural hematoma) 04/11/2022   Fall (on)(from) sidewalk curb, initial encounter 04/11/2022   History of melanoma excision 04/11/2022   Diabetes mellitus type 2 in obese 04/11/2022   DNR (do not resuscitate) 04/11/2022   Chronic anticoagulation 06/30/2021   Typical atrial flutter 06/28/2021   Pure hypercholesterolemia 12/23/2013   Carotid artery disease 12/23/2013   Essential hypertension, benign 12/23/2013   Angina decubitus 12/23/2013   Coronary artery disease    Cancer    Depression    Pulmonary emboli    Hypercholesteremia    Kidney stone    Chronic renal insufficiency     ONSET DATE: 04/11/2022 (referral=11/06/22)  REFERRING DIAG: S06.5XAA- Traumatic subdural hemorrhage with loss of consciousness  THERAPY DIAG: No diagnosis found.  Rationale for Evaluation and Treatment: Rehabilitation  SUBJECTIVE:   SUBJECTIVE STATEMENT: "*** Pt accompanied by: self  PERTINENT HISTORY: 77 y.o. male presents to [facility] hospital on 7/14 after falling. Pt found to have small parafalcine SDH and right tentorial SDH , reporting nausea, HA, and dizziness. PMH:  includes anxiety, aflutter, depression, HTN, PE, CABG, DMII.   PAIN: Are you having pain? No  FALLS: Has patient fallen in last 6 months?  Yes, Number of falls: 3-4 (tripping)  PATIENT GOALS: "I want to be able to figure things out on my own. I want to confidently talk with people and not feel like I have to run and  hide "   OBJECTIVE:   TODAY'S TREATMENT:                                                                                                                                         01-19-23:  01-12-23: Returned after nearly 1 month d/t awaiting auth for additional ST visits. In verbal recap of last month, pt reported he "flunked" driving test which resulted in revoking of driver's license. Also reported missing several hearing aid appointments due to life situations. Endorsed good carryover of calendar and memory supports to aid recall, with success indicated. Cued recall of recent ED event, in which pt endorsed possible psychosis. Some difficulty with thought organization and verbal expression noted today in conversation. Conversational speech c/b intermittent vague language and occasional anomia. Pt stated "there are some words I can't get out." Targeted word retrieval strategies and SFA this session to optimize communication effectiveness. Usual mod A required to aid comprehension and carryover.   12-19-22: Able to demo improved good problem solving, in which pt identified appropriate hearing clinic with appointment scheduled. Cued patient to write appointment on calendar. Min A required to ID correct date (19th vs 29th). Targeted problem solving to determine if this clinic accepts his insurance. Prompted using computer to investigate. Intermittent min cues required to maintain attention to targeted subject. Educated and instructed attention/processing/comprehension strategies to aid patient participation in conversation. Identified strategies x4 to implement at home with wife to repair communication breakdown. Due to time constraints, word finding strategies to be educated and trained next session.    12-17-22: Pt has demonstrated increased ability to manage medications more effectively with revision of updated personal medication list and double checking per SLP recommendations. Reported progress with memory  but endorsed difficulty in conversation, particularly over the phone. Deduced difficulty stems from hearing impairment and auditory processing/comprehension. Targeted functional problem solving to address hearing impairment with use of visual aid. With usual prompting to deduce possible solutions, pt able to ID best solution (see audiologist). Pt able to sequence appropriate next steps with usual min A. SLP initiated education and instruction of attention/auditory comprehension strategies to aid attention, recall, and understanding in conversation. Pt would benefit from additional  training to aid receptive and expressive language as pt noted with intermittent difficulty with word retrieval and answering SLP questions in conversation today.   12-12-22: Disorientation exhibited upon entrance. SLP provided mod prompting and cues to ID locate correct date using established visual aids in binder. Errors noted on calendar (not marking off days & marking off days in future). Re-education, verbal/visual cues, and written prompt provided to aid recall and carryover for future use. Identified appropriate solution to look up time difference for personal matter. Max A required to calculate time difference between now and in [place]. Re-attempted medication task for error awareness, attention, recall, and problem solving. Pt completed with 50% accuracy independently. Intermittent mod A required to ID errors and min A required to verbally correct errors. Pt would continue to benefit from skilled ST intervention given ongoing cognitive deficits.   12-10-22: Double checked time calculation worksheet for hwk, with 66% accuracy achieved independently. Frequent mod A required to correct errors. Benefited from visual aids (clock and written numbers) and verbal cues to break down calculations. In discussion, some prior difficulty with calculations reported which appears in part to impact current performance. Discussed functional problem  solving at home, in which usual prompting required to ID functional solutions. Began targeting error awareness for medication task, in which usual extended processing time required for comprehension and reduced attention noted. Plan to continue next session.   12-05-22: Improved physical and mental organization exhibited with use of binder. Error noted on to-do list, in which pt able to ID error and correct with min A. Occasional mod A required to appropriately use written aids (to-do lists versus calendar). Completed functional time calculation homework. SLP assisted with double checking hwk. Independently, pt answered time questions with 59% accuracy. With max fading to min verbal/visual cues, accuracy improved to 100%. Visual aids were most effective to optimize understanding, recall, and calculations. Recommended pt double check additional page as part of hwk to heighten error awareness. Double checked/cross referenced personal med list with list provided by MD. Pt able to ID 2 discrepancies with min A. Recommended pt determine discrepancy at home and correct errors as needed on documentation.   12-03-22: Indicated safety concern with pt reportedly rushing to arrive on time to today's appointment. Of note, pt arrived nearly 15 minutes early according to check-in time. Educated patient on safety concerns and more appropriate solutions (ex: call to alert of late arrival versus rush to get here). Overt difficulty with time management and time manipulation demonstrated, as pt reported needing to leave therapy early for appointment over 2 hours in future. Targeted functional time calculations between appointments, with usual max A required to add/subtract time between appointments and to determine appropriate departure times. Visual aids (written time and clock) were mildly beneficial to improve pt understanding and accuracy. As recommended, pt provided binder to organize therapy materials to aid organization,  recall, and attention. Errors on calendar noted, in which pt marked out 3 future dates. Pt recognized error with mod verbal prompt. Usual max A required to re-orient to today's date and correct errors. Intermittent mod fading to min A required to locate targeted items within binder during structured practice. Provided time calculations as HEP.    11-28-22: Pt reported initial early arrival nearly 1.5 hours ahead for 9:30 appointment, in which pt was cued of error by empty parking lot. Re-directed patient to calendar and visual aids indicating appointment time. Of note, pt had check marked the appointment time and needed items on his handout,  indicating initial awareness but reduced delayed recall. Verbal cues provided to slow rate to optimize attention for locating calendar in midst of paperwork, with second attempt required. Usual mod A required to use calendar this date. SLP utilized visual cues to optimize attention and recall for orientation (crossing out past dates, highlighting information). Recommended 3-ring binder for improved organization of paperwork for improved attention and recall.  Targeted medication management task with his medication list and his empty bill box. Pt able to verbalize appropriate placement and amounts with 90% accuracy. Intermittent fading to rare cues required to read entirety or instructions. Cued visual markers were beneficial to aid pt attention of med list (check marks and paper guide).  11-25-22: Reported recent personal event which was distressing patient. SLP re-educated impact of internal/external distractions on attention and how to manage to reduce impact on focused attention. Today, pt required occasional min-mod A and extra processing time to re-direct attention back to conversation x3 today. Pt exhibiting some slow improvements in safety awareness with use of trained techniques, with pt reporting locking barn d/t safety concern. Some discrepancies in appointments and  disorientation exhibited. SLP cued patient to locate calendar, in which pt flipped past calendar x2 indicating reduced awareness and seemingly decreased attention/recall of target. Max A required to locate calendar. Min A required to ID relevant information on calendar. Pt reportedly managing his medications independently. Told SLP he bought extra pill box to fill out two weeks at a time. Based on patient reduced attention to detail and impaired error awareness, facilitated task to verbally sort novel medications x3. Usual re-reading of pill bottle required to aid recall of instructions, with usual mod to max A required to ID placement given unfamiliar pill box. Intermittent mod cues required to place previous bottle out of the way to reduce repetition. Requested pt bring his typed list of current medications and new empty pill box to practice next session given functional implications.    PATIENT EDUCATION: Education details: see above Person educated: Patient Education method: Programmer, multimedia, Demonstration, Verbal cues, and Handouts Education comprehension: verbalized understanding, returned demonstration, and needs further education   GOALS: Goals reviewed with patient? Yes  SHORT TERM GOALS: Target date: 12/05/2022  Complete CLQT and PROM first 1-2 sessions with thorough discussion of results to heighten pt awareness  Baseline:  Goal status: MET  2.  Pt will follow multi-step instructions to complete functional task with ~90% accuracy given occasional min A for 2/2 opportunities  Baseline: 11-28-22 Goal status: PARTIALLY MET  3.  Pt will demonstrate increased attention to detail on structured iADL tasks with 3 or less errors exhibited given occasional min A  Baseline: 12/05/22 Goal status: PARTIALLY MET  4.  Pt will demonstrate increased safety awareness by verbalizing problem and solution prior to initiating task for 2/2 opportunities  Baseline: 11-19-22 Goal status: PARTIALLY MET  5.   Pt  will utilize processing/attention strategies in short structured conversations to complete entirety of thought for 80% of messages given occasional min A  Baseline: 11-19-22, 11-21-22 Goal status: MET  LONG TERM GOALS: Target date: 01/30/2023  Pt will successfully manage iADLs (medications, finances, appointments) with use of memory aids and compensations given rare min A form wife Baseline:  Goal status: IN PROGRESS  2.  Pt will demonstrate improved safety awareness and attention to detail with less frequent A from wife to ID safety concerns and complete functional tasks given rare min A  Baseline:  Goal status: IN PROGRESS  3.  Pt will  utilize processing/attention strategies in extended unstructured conversations to complete entirety of thoughts for ~90% of messages given rare min A Baseline:  Goal status: IN PROGRESS  4.  Pt will demonstrate [facility] verbal expression with no evidence of anomia/dysnomia (compensate as needed) in 30+ minute conversations x2 Baseline:  Goal status: IN PROGRESS   ASSESSMENT:  CLINICAL IMPRESSION: Patient is a 77 y.o. male who was seen today for cognitive rehabilitation following SDH from fall in July 2023. Conducted ongoing education and instruction of memory, attention/processing, and problem solving strategies to optimize cognitive functioning. Initiated additional education and training to aid pt communication, as pt exhibiting difficulty with expressive and receptive language in conversation. Given significant functional impact and increased frustration for patient and wife, pt would benefit from skilled ST intervention to optimize cognitive functioning to maximize return to prior baseline.   OBJECTIVE IMPAIRMENTS: include attention, memory, awareness, and executive functioning. These impairments are limiting patient from managing medications, managing finances, and household responsibilities. Factors affecting potential to achieve goals and functional  outcome are ability to learn/carryover information. Patient will benefit from skilled SLP services to address above impairments and improve overall function.  REHAB POTENTIAL: Good  PLAN:  SLP FREQUENCY: 2x/week  SLP DURATION: 12 weeks  PLANNED INTERVENTIONS: Language facilitation, Environmental controls, Cueing hierachy, Cognitive reorganization, Internal/external aids, Functional tasks, Multimodal communication approach, SLP instruction and feedback, Compensatory strategies, and Patient/family education    Gracy Racer, CCC-SLP 01/18/2023, 7:45 PM

## 2023-01-19 ENCOUNTER — Ambulatory Visit: Payer: Worker's Compensation

## 2023-01-19 ENCOUNTER — Telehealth: Payer: Self-pay

## 2023-01-19 NOTE — Telephone Encounter (Signed)
Contacted patient re: no-show today. Reported not written on his calendar. Reminded patient of new therapy schedule with front office staff providing printed calendars x2 with updated schedule. Reviewed next upcoming appointment. Pt verbalized understanding.

## 2023-01-20 DIAGNOSIS — E291 Testicular hypofunction: Secondary | ICD-10-CM | POA: Diagnosis not present

## 2023-01-21 ENCOUNTER — Ambulatory Visit: Payer: Worker's Compensation

## 2023-01-21 DIAGNOSIS — R41841 Cognitive communication deficit: Secondary | ICD-10-CM | POA: Diagnosis not present

## 2023-01-21 NOTE — Therapy (Signed)
OUTPATIENT SPEECH LANGUAGE PATHOLOGY TREATMENT   Patient Name: Evan [name] MRN: [id number] DOB:14-Sep-1946, 77 y.o., male Today's Date: 01/21/2023  PCP: Marden Noble MD REFERRING PROVIDER: Tia Alert MD  END OF SESSION:  End of Session - 01/21/23 0933     Visit Number 15    Number of Visits 25    Date for SLP Re-Evaluation 01/30/23    Authorization Type [employer]    Authorization Time Period 12 ST addl visits apprd - 12/23/21    SLP Start Time 0932    SLP Stop Time  1015    SLP Time Calculation (min) 43 min    Activity Tolerance Patient tolerated treatment well                    Past Medical History:  Diagnosis Date   Acquired dilation of ascending aorta and aortic root    a.) measurements by TTE in 04/2021 --> root 40 mm and ascending aorta 43 mm   Anginal pain    Anxiety    Atrial flutter 04/22/2021   a.) CHADS2VASc = 5 (age x 2, HTN, T2DM, CAD); b.) daily warfarin   Carotid disease, bilateral    a.) Doppler in 2019 --> 1-39% stenosis on RIGHT and 40-58% on LEFT   Cataracts, bilateral    Chronic anticoagulation    Warfarin   Chronic renal insufficiency    Coronary artery disease    a.) s/p 3v CABG in 1999   Depression    GERD (gastroesophageal reflux disease)    History of kidney stones    Hypercholesteremia    Hypertension    Kidney stone    Low testosterone    on TRT injections   Malignant melanoma    a.) face, nose, left leg; b.) Tx'd with chemotherapy + XRT at [facility]   Myocardial infarction    Pulmonary emboli    S/P CABG x 3 1999   a.) LIMA-LAD, SVG-RCA, SVG-diagnonal   Sleep apnea    not using nocturnal PAP therapy   T2DM (type 2 diabetes mellitus)    Past Surgical History:  Procedure Laterality Date   CARDIAC CATHETERIZATION     x3     last 1999   CARDIOVERSION N/A 08/12/2021   Procedure: CARDIOVERSION;  Surgeon: Pricilla Riffle, MD;  Location: [facility] ENDOSCOPY;  Service: Cardiovascular;  Laterality: N/A;    CATARACT EXTRACTION Bilateral    COLONOSCOPY WITH PROPOFOL N/A 09/26/2014   Procedure: COLONOSCOPY WITH PROPOFOL;  Surgeon: Charolett Bumpers, MD;  Location: [facility] ENDOSCOPY;  Service: Endoscopy;  Laterality: N/A;   CORONARY ARTERY BYPASS GRAFT N/A 1999   3v; LIMA-LAD, SVG-RCA, SVG-diagonal   HOLEP-LASER ENUCLEATION OF THE PROSTATE WITH MORCELLATION N/A 06/07/2021   Procedure: HOLEP-LASER ENUCLEATION OF THE PROSTATE WITH MORCELLATION;  Surgeon: Sondra Come, MD;  Location: [facility] ORS;  Service: Urology;  Laterality: N/A;   KNEE ARTHROSCOPY Left    LAPAROSCOPIC CHOLECYSTECTOMY     MASS EXCISION  04/15/2012   Procedure: EXCISION MASS;  Surgeon: Suzanna Obey, MD;  Location: [facility] OR;  Service: ENT;  Laterality: Left;  Left tonsil biopsy   melanomia     several skin ca removed-nose, left ankle, parotid gland left, right nodes-being followed annually..   Patient Active Problem List   Diagnosis Date Noted   SDH (subdural hematoma) 04/11/2022   Fall (on)(from) sidewalk curb, initial encounter 04/11/2022   History of melanoma excision 04/11/2022   Diabetes mellitus type 2 in obese  04/11/2022   DNR (do not resuscitate) 04/11/2022   Chronic anticoagulation 06/30/2021   Typical atrial flutter 06/28/2021   Pure hypercholesterolemia 12/23/2013   Carotid artery disease 12/23/2013   Essential hypertension, benign 12/23/2013   Angina decubitus 12/23/2013   Coronary artery disease    Cancer    Depression    Pulmonary emboli    Hypercholesteremia    Kidney stone    Chronic renal insufficiency     ONSET DATE: 04/11/2022 (referral=11/06/22)  REFERRING DIAG: S06.5XAA- Traumatic subdural hemorrhage with loss of consciousness   THERAPY DIAG: Cognitive communication deficit  Rationale for Evaluation and Treatment: Rehabilitation  SUBJECTIVE:   SUBJECTIVE STATEMENT: "Stuff happened" Pt accompanied by: self  PERTINENT HISTORY: 77 y.o. male presents to [facility] hospital on 7/14 after falling. Pt found to have  small parafalcine SDH and right tentorial SDH , reporting nausea, HA, and dizziness. PMH:  includes anxiety, aflutter, depression, HTN, PE, CABG, DMII.   PAIN: Are you having pain? No  FALLS: Has patient fallen in last 6 months?  Yes, Number of falls: 3-4 (tripping)  PATIENT GOALS: "I want to be able to figure things out on my own. I want to confidently talk with people and not feel like I have to run and hide "   OBJECTIVE:   TODAY'S TREATMENT:                                                                                                                                         01-21-23: Endorsed difficulty alternating attention to transfer information between printed calendar and personal home calendar. Initial max A required to orient to today's date; recommended use of phone to aid orientation. Good carryover of phone exhibited throughout session when cued. Targeted transferring information from one written calendar to another with usual fading to occasional max A required to complete task. Overt errors noted x3 which pt did not ID even with cued double check. Given ongoing overt difficulty managing calendar due to impaired attention, recall, and awareness, recommended increased assistance from wife as pt unable to carryover trained techniques independently. Reported some use of trained description strategy to aid anomia with less frustration reported. Reviewed descriptions written for homework with usual max cues required to increase specificity. Updated HEP to optimize descriptions for additional practice.  01-12-23: Returned after nearly 1 month d/t awaiting auth for additional ST visits. In verbal recap of last month, pt reported he "flunked" driving test which resulted in revoking of driver's license. Also reported missing several hearing aid appointments due to life situations. Endorsed good carryover of calendar and memory supports to aid recall, with success indicated. Cued recall of recent ED  event, in which pt endorsed possible psychosis. Some difficulty with thought organization and verbal expression noted today in conversation. Conversational speech c/b intermittent vague language and occasional anomia. Pt stated "there are some words I can't get out." Targeted word  retrieval strategies and SFA this session to optimize communication effectiveness. Usual mod A required to aid comprehension and carryover.   12-19-22: Able to demo improved good problem solving, in which pt identified appropriate hearing clinic with appointment scheduled. Cued patient to write appointment on calendar. Min A required to ID correct date (19th vs 29th). Targeted problem solving to determine if this clinic accepts his insurance. Prompted using computer to investigate. Intermittent min cues required to maintain attention to targeted subject. Educated and instructed attention/processing/comprehension strategies to aid patient participation in conversation. Identified strategies x4 to implement at home with wife to repair communication breakdown. Due to time constraints, word finding strategies to be educated and trained next session.    12-17-22: Pt has demonstrated increased ability to manage medications more effectively with revision of updated personal medication list and double checking per SLP recommendations. Reported progress with memory but endorsed difficulty in conversation, particularly over the phone. Deduced difficulty stems from hearing impairment and auditory processing/comprehension. Targeted functional problem solving to address hearing impairment with use of visual aid. With usual prompting to deduce possible solutions, pt able to ID best solution (see audiologist). Pt able to sequence appropriate next steps with usual min A. SLP initiated education and instruction of attention/auditory comprehension strategies to aid attention, recall, and understanding in conversation. Pt would benefit from additional  training to aid receptive and expressive language as pt noted with intermittent difficulty with word retrieval and answering SLP questions in conversation today.   12-12-22: Disorientation exhibited upon entrance. SLP provided mod prompting and cues to ID locate correct date using established visual aids in binder. Errors noted on calendar (not marking off days & marking off days in future). Re-education, verbal/visual cues, and written prompt provided to aid recall and carryover for future use. Identified appropriate solution to look up time difference for personal matter. Max A required to calculate time difference between now and in [place]. Re-attempted medication task for error awareness, attention, recall, and problem solving. Pt completed with 50% accuracy independently. Intermittent mod A required to ID errors and min A required to verbally correct errors. Pt would continue to benefit from skilled ST intervention given ongoing cognitive deficits.   12-10-22: Double checked time calculation worksheet for hwk, with 66% accuracy achieved independently. Frequent mod A required to correct errors. Benefited from visual aids (clock and written numbers) and verbal cues to break down calculations. In discussion, some prior difficulty with calculations reported which appears in part to impact current performance. Discussed functional problem solving at home, in which usual prompting required to ID functional solutions. Began targeting error awareness for medication task, in which usual extended processing time required for comprehension and reduced attention noted. Plan to continue next session.   12-05-22: Improved physical and mental organization exhibited with use of binder. Error noted on to-do list, in which pt able to ID error and correct with min A. Occasional mod A required to appropriately use written aids (to-do lists versus calendar). Completed functional time calculation homework. SLP assisted with double  checking hwk. Independently, pt answered time questions with 59% accuracy. With max fading to min verbal/visual cues, accuracy improved to 100%. Visual aids were most effective to optimize understanding, recall, and calculations. Recommended pt double check additional page as part of hwk to heighten error awareness. Double checked/cross referenced personal med list with list provided by MD. Pt able to ID 2 discrepancies with min A. Recommended pt determine discrepancy at home and correct errors as needed on documentation.  12-03-22: Indicated safety concern with pt reportedly rushing to arrive on time to today's appointment. Of note, pt arrived nearly 15 minutes early according to check-in time. Educated patient on safety concerns and more appropriate solutions (ex: call to alert of late arrival versus rush to get here). Overt difficulty with time management and time manipulation demonstrated, as pt reported needing to leave therapy early for appointment over 2 hours in future. Targeted functional time calculations between appointments, with usual max A required to add/subtract time between appointments and to determine appropriate departure times. Visual aids (written time and clock) were mildly beneficial to improve pt understanding and accuracy. As recommended, pt provided binder to organize therapy materials to aid organization, recall, and attention. Errors on calendar noted, in which pt marked out 3 future dates. Pt recognized error with mod verbal prompt. Usual max A required to re-orient to today's date and correct errors. Intermittent mod fading to min A required to locate targeted items within binder during structured practice. Provided time calculations as HEP.    11-28-22: Pt reported initial early arrival nearly 1.5 hours ahead for 9:30 appointment, in which pt was cued of error by empty parking lot. Re-directed patient to calendar and visual aids indicating appointment time. Of note, pt had check  marked the appointment time and needed items on his handout, indicating initial awareness but reduced delayed recall. Verbal cues provided to slow rate to optimize attention for locating calendar in midst of paperwork, with second attempt required. Usual mod A required to use calendar this date. SLP utilized visual cues to optimize attention and recall for orientation (crossing out past dates, highlighting information). Recommended 3-ring binder for improved organization of paperwork for improved attention and recall.  Targeted medication management task with his medication list and his empty bill box. Pt able to verbalize appropriate placement and amounts with 90% accuracy. Intermittent fading to rare cues required to read entirety or instructions. Cued visual markers were beneficial to aid pt attention of med list (check marks and paper guide).  11-25-22: Reported recent personal event which was distressing patient. SLP re-educated impact of internal/external distractions on attention and how to manage to reduce impact on focused attention. Today, pt required occasional min-mod A and extra processing time to re-direct attention back to conversation x3 today. Pt exhibiting some slow improvements in safety awareness with use of trained techniques, with pt reporting locking barn d/t safety concern. Some discrepancies in appointments and disorientation exhibited. SLP cued patient to locate calendar, in which pt flipped past calendar x2 indicating reduced awareness and seemingly decreased attention/recall of target. Max A required to locate calendar. Min A required to ID relevant information on calendar. Pt reportedly managing his medications independently. Told SLP he bought extra pill box to fill out two weeks at a time. Based on patient reduced attention to detail and impaired error awareness, facilitated task to verbally sort novel medications x3. Usual re-reading of pill bottle required to aid recall of  instructions, with usual mod to max A required to ID placement given unfamiliar pill box. Intermittent mod cues required to place previous bottle out of the way to reduce repetition. Requested pt bring his typed list of current medications and new empty pill box to practice next session given functional implications.    PATIENT EDUCATION: Education details: see above Person educated: Patient Education method: Programmer, multimedia, Demonstration, Verbal cues, and Handouts Education comprehension: verbalized understanding, returned demonstration, and needs further education   GOALS: Goals reviewed with patient? Yes  SHORT TERM GOALS: Target date: 12/05/2022  Complete CLQT and PROM first 1-2 sessions with thorough discussion of results to heighten pt awareness  Baseline:  Goal status: MET  2.  Pt will follow multi-step instructions to complete functional task with ~90% accuracy given occasional min A for 2/2 opportunities  Baseline: 11-28-22 Goal status: PARTIALLY MET  3.  Pt will demonstrate increased attention to detail on structured iADL tasks with 3 or less errors exhibited given occasional min A  Baseline: 12/05/22 Goal status: PARTIALLY MET  4.  Pt will demonstrate increased safety awareness by verbalizing problem and solution prior to initiating task for 2/2 opportunities  Baseline: 11-19-22 Goal status: PARTIALLY MET  5.   Pt will utilize processing/attention strategies in short structured conversations to complete entirety of thought for 80% of messages given occasional min A  Baseline: 11-19-22, 11-21-22 Goal status: MET  LONG TERM GOALS: Target date: 01/30/2023  Pt will successfully manage iADLs (medications, finances, appointments) with use of memory aids and compensations given rare min A form wife Baseline:  Goal status: IN PROGRESS  2.  Pt will demonstrate improved safety awareness and attention to detail with less frequent A from wife to ID safety concerns and complete functional  tasks given rare min A  Baseline:  Goal status: IN PROGRESS  3.  Pt will utilize processing/attention strategies in extended unstructured conversations to complete entirety of thoughts for ~90% of messages given rare min A Baseline:  Goal status: IN PROGRESS  4.  Pt will demonstrate [facility] verbal expression with no evidence of anomia/dysnomia (compensate as needed) in 30+ minute conversations x2 Baseline:  Goal status: IN PROGRESS   ASSESSMENT:  CLINICAL IMPRESSION: Patient is a 77 y.o. male who was seen today for cognitive rehabilitation following SDH from fall in July 2023. Conducted ongoing education and instruction of memory, attention/processing, and problem solving strategies to optimize cognitive functioning. Continued additional education and training to aid pt communication, as pt exhibiting difficulty with expressive and receptive language in conversation. Given significant functional impact and increased frustration for patient and wife, pt would benefit from skilled ST intervention to optimize cognitive functioning to maximize return to prior baseline.   OBJECTIVE IMPAIRMENTS: include attention, memory, awareness, and executive functioning. These impairments are limiting patient from managing medications, managing finances, and household responsibilities. Factors affecting potential to achieve goals and functional outcome are ability to learn/carryover information. Patient will benefit from skilled SLP services to address above impairments and improve overall function.  REHAB POTENTIAL: Good  PLAN:  SLP FREQUENCY: 2x/week  SLP DURATION: 12 weeks  PLANNED INTERVENTIONS: Language facilitation, Environmental controls, Cueing hierachy, Cognitive reorganization, Internal/external aids, Functional tasks, Multimodal communication approach, SLP instruction and feedback, Compensatory strategies, and Patient/family education    Gracy Racer, CCC-SLP 01/21/2023, 11:02 AM

## 2023-01-21 NOTE — Patient Instructions (Signed)
I would recommend your wife continue to assist you with managing your appointments since this continues to be difficult for you.  Look at your phone if you need to know the date and day of week. Click the lock button once so you can see this information    When you write, use a pencil so you can fix any mistakes. Read aloud when you write something down to help your attention.

## 2023-01-26 ENCOUNTER — Ambulatory Visit: Payer: Worker's Compensation | Attending: Neurological Surgery

## 2023-01-26 DIAGNOSIS — R41841 Cognitive communication deficit: Secondary | ICD-10-CM | POA: Insufficient documentation

## 2023-01-26 NOTE — Patient Instructions (Signed)
Play the guessing game:  Pick an item to describe Provide a very specific, thorough description so your listener can guess what you are talking about Speak in whole sentences versus single words Be specific! Think about these questions: What do you do with this thing? Where do you find it? What does it look like? What is the purpose? When do you use it?

## 2023-01-26 NOTE — Therapy (Signed)
OUTPATIENT SPEECH LANGUAGE PATHOLOGY TREATMENT   Patient Name: Evan [name] MRN: [id number] DOB:1946/07/18, 77 y.o., male Today's Date: 01/26/2023  PCP: Marden Noble MD REFERRING PROVIDER: Tia Alert MD  END OF SESSION:  End of Session - 01/26/23 0846     Visit Number 16    Number of Visits 25    Date for SLP Re-Evaluation 01/30/23    Authorization Type [employer]    Authorization Time Period 12 ST addl visits apprd - 12/23/21    SLP Start Time 0850    SLP Stop Time  0935    SLP Time Calculation (min) 45 min    Activity Tolerance Patient tolerated treatment well                     Past Medical History:  Diagnosis Date   Acquired dilation of ascending aorta and aortic root (HCC)    a.) measurements by TTE in 04/2021 --> root 40 mm and ascending aorta 43 mm   Anginal pain (HCC)    Anxiety    Atrial flutter (HCC) 04/22/2021   a.) CHADS2VASc = 5 (age x 2, HTN, T2DM, CAD); b.) daily warfarin   Carotid disease, bilateral (HCC)    a.) Doppler in 2019 --> 1-39% stenosis on RIGHT and 40-58% on LEFT   Cataracts, bilateral    Chronic anticoagulation    Warfarin   Chronic renal insufficiency    Coronary artery disease    a.) s/p 3v CABG in 1999   Depression    GERD (gastroesophageal reflux disease)    History of kidney stones    Hypercholesteremia    Hypertension    Kidney stone    Low testosterone    on TRT injections   Malignant melanoma (HCC)    a.) face, nose, left leg; b.) Tx'd with chemotherapy + XRT at [facility]   Myocardial infarction [facility])    Pulmonary emboli (HCC)    S/P CABG x 3 1999   a.) LIMA-LAD, SVG-RCA, SVG-diagnonal   Sleep apnea    not using nocturnal PAP therapy   T2DM (type 2 diabetes mellitus) (HCC)    Past Surgical History:  Procedure Laterality Date   CARDIAC CATHETERIZATION     x3     last 1999   CARDIOVERSION N/A 08/12/2021   Procedure: CARDIOVERSION;  Surgeon: Pricilla Riffle, MD;  Location: [facility]  ENDOSCOPY;  Service: Cardiovascular;  Laterality: N/A;   CATARACT EXTRACTION Bilateral    COLONOSCOPY WITH PROPOFOL N/A 09/26/2014   Procedure: COLONOSCOPY WITH PROPOFOL;  Surgeon: Charolett Bumpers, MD;  Location: [facility] ENDOSCOPY;  Service: Endoscopy;  Laterality: N/A;   CORONARY ARTERY BYPASS GRAFT N/A 1999   3v; LIMA-LAD, SVG-RCA, SVG-diagonal   HOLEP-LASER ENUCLEATION OF THE PROSTATE WITH MORCELLATION N/A 06/07/2021   Procedure: HOLEP-LASER ENUCLEATION OF THE PROSTATE WITH MORCELLATION;  Surgeon: Sondra Come, MD;  Location: [facility] ORS;  Service: Urology;  Laterality: N/A;   KNEE ARTHROSCOPY Left    LAPAROSCOPIC CHOLECYSTECTOMY     MASS EXCISION  04/15/2012   Procedure: EXCISION MASS;  Surgeon: Suzanna Obey, MD;  Location: [facility] OR;  Service: ENT;  Laterality: Left;  Left tonsil biopsy   melanomia     several skin ca removed-nose, left ankle, parotid gland left, right nodes-being followed annually..   Patient Active Problem List   Diagnosis Date Noted   SDH (subdural hematoma) (HCC) 04/11/2022   Fall (on)(from) sidewalk curb, initial encounter 04/11/2022   History of melanoma  excision 04/11/2022   Diabetes mellitus type 2 in obese 04/11/2022   DNR (do not resuscitate) 04/11/2022   Chronic anticoagulation 06/30/2021   Typical atrial flutter (HCC) 06/28/2021   Pure hypercholesterolemia 12/23/2013   Carotid artery disease (HCC) 12/23/2013   Essential hypertension, benign 12/23/2013   Angina decubitus 12/23/2013   Coronary artery disease    Cancer (HCC)    Depression    Pulmonary emboli (HCC)    Hypercholesteremia    Kidney stone    Chronic renal insufficiency     ONSET DATE: 04/11/2022 (referral=11/06/22)  REFERRING DIAG: S06.5XAA- Traumatic subdural hemorrhage with loss of consciousness   THERAPY DIAG: Cognitive communication deficit  Rationale for Evaluation and Treatment: Rehabilitation  SUBJECTIVE:   SUBJECTIVE STATEMENT: "Going okay" Pt accompanied by: self  PERTINENT  HISTORY: 77 y.o. male presents [facility] hospital on 7/14 after falling. Pt found to have small parafalcine SDH and right tentorial SDH , reporting nausea, HA, and dizziness. PMH:  includes anxiety, aflutter, depression, HTN, PE, CABG, DMII.   PAIN: Are you having pain? No  FALLS: Has patient fallen in last 6 months?  Yes, Number of falls: 3-4 (tripping)  PATIENT GOALS: "I want to be able to figure things out on my own. I want to confidently talk with people and not feel like I have to run and hide "   OBJECTIVE:   TODAY'S TREATMENT:                                                                                                                                         01-26-23: Reviewed homework to use description strategy for word retrieval, with noted difficulty understanding and implementing compensation. SLP re-educated rationale and structured techniques to optimize describing ability (SFA, written prompts, modeling) to aid word finding. Benefited from usual fading to occasional modeling and verbal cues to optimize specificity of descriptions. Usual extra processing time was beneficial for word retrieval and comprehension.   01-21-23: Endorsed difficulty alternating attention to transfer information between printed calendar and personal home calendar. Initial max A required to orient to today's date; recommended use of phone to aid orientation. Good carryover of phone exhibited throughout session when cued. Targeted transferring information from one written calendar to another with usual fading to occasional max A required to complete task. Overt errors noted x3 which pt did not ID even with cued double check. Given ongoing overt difficulty managing calendar due to impaired attention, recall, and awareness, recommended increased assistance from wife as pt unable to carryover trained techniques independently. Reported some use of trained description strategy to aid anomia with less frustration reported.  Reviewed descriptions written for homework with usual max cues required to increase specificity. Updated HEP to optimize descriptions for additional practice.  01-12-23: Returned after nearly 1 month d/t awaiting auth for additional ST visits. In verbal recap of last month, pt reported he "flunked" driving test  which resulted in revoking of driver's license. Also reported missing several hearing aid appointments due to life situations. Endorsed good carryover of calendar and memory supports to aid recall, with success indicated. Cued recall of recent ED event, in which pt endorsed possible psychosis. Some difficulty with thought organization and verbal expression noted today in conversation. Conversational speech c/b intermittent vague language and occasional anomia. Pt stated "there are some words I can't get out." Targeted word retrieval strategies and SFA this session to optimize communication effectiveness. Usual mod A required to aid comprehension and carryover.   12-19-22: Able to demo improved good problem solving, in which pt identified appropriate hearing clinic with appointment scheduled. Cued patient to write appointment on calendar. Min A required to ID correct date (19th vs 29th). Targeted problem solving to determine if this clinic accepts his insurance. Prompted using computer to investigate. Intermittent min cues required to maintain attention to targeted subject. Educated and instructed attention/processing/comprehension strategies to aid patient participation in conversation. Identified strategies x4 to implement at home with wife to repair communication breakdown. Due to time constraints, word finding strategies to be educated and trained next session.    12-17-22: Pt has demonstrated increased ability to manage medications more effectively with revision of updated personal medication list and double checking per SLP recommendations. Reported progress with memory but endorsed difficulty in  conversation, particularly over the phone. Deduced difficulty stems from hearing impairment and auditory processing/comprehension. Targeted functional problem solving to address hearing impairment with use of visual aid. With usual prompting to deduce possible solutions, pt able to ID best solution (see audiologist). Pt able to sequence appropriate next steps with usual min A. SLP initiated education and instruction of attention/auditory comprehension strategies to aid attention, recall, and understanding in conversation. Pt would benefit from additional training to aid receptive and expressive language as pt noted with intermittent difficulty with word retrieval and answering SLP questions in conversation today.   12-12-22: Disorientation exhibited upon entrance. SLP provided mod prompting and cues to ID locate correct date using established visual aids in binder. Errors noted on calendar (not marking off days & marking off days in future). Re-education, verbal/visual cues, and written prompt provided to aid recall and carryover for future use. Identified appropriate solution to look up time difference for personal matter. Max A required to calculate time difference between now and in [place]. Re-attempted medication task for error awareness, attention, recall, and problem solving. Pt completed with 50% accuracy independently. Intermittent mod A required to ID errors and min A required to verbally correct errors. Pt would continue to benefit from skilled ST intervention given ongoing cognitive deficits.   12-10-22: Double checked time calculation worksheet for hwk, with 66% accuracy achieved independently. Frequent mod A required to correct errors. Benefited from visual aids (clock and written numbers) and verbal cues to break down calculations. In discussion, some prior difficulty with calculations reported which appears in part to impact current performance. Discussed functional problem solving at home, in which  usual prompting required to ID functional solutions. Began targeting error awareness for medication task, in which usual extended processing time required for comprehension and reduced attention noted. Plan to continue next session.   12-05-22: Improved physical and mental organization exhibited with use of binder. Error noted on to-do list, in which pt able to ID error and correct with min A. Occasional mod A required to appropriately use written aids (to-do lists versus calendar). Completed functional time calculation homework. SLP assisted with double checking hwk.  Independently, pt answered time questions with 59% accuracy. With max fading to min verbal/visual cues, accuracy improved to 100%. Visual aids were most effective to optimize understanding, recall, and calculations. Recommended pt double check additional page as part of hwk to heighten error awareness. Double checked/cross referenced personal med list with list provided by MD. Pt able to ID 2 discrepancies with min A. Recommended pt determine discrepancy at home and correct errors as needed on documentation.   12-03-22: Indicated safety concern with pt reportedly rushing to arrive on time to today's appointment. Of note, pt arrived nearly 15 minutes early according to check-in time. Educated patient on safety concerns and more appropriate solutions (ex: call to alert of late arrival versus rush to get here). Overt difficulty with time management and time manipulation demonstrated, as pt reported needing to leave therapy early for appointment over 2 hours in future. Targeted functional time calculations between appointments, with usual max A required to add/subtract time between appointments and to determine appropriate departure times. Visual aids (written time and clock) were mildly beneficial to improve pt understanding and accuracy. As recommended, pt provided binder to organize therapy materials to aid organization, recall, and attention. Errors on  calendar noted, in which pt marked out 3 future dates. Pt recognized error with mod verbal prompt. Usual max A required to re-orient to today's date and correct errors. Intermittent mod fading to min A required to locate targeted items within binder during structured practice. Provided time calculations as HEP.    11-28-22: Pt reported initial early arrival nearly 1.5 hours ahead for 9:30 appointment, in which pt was cued of error by empty parking lot. Re-directed patient to calendar and visual aids indicating appointment time. Of note, pt had check marked the appointment time and needed items on his handout, indicating initial awareness but reduced delayed recall. Verbal cues provided to slow rate to optimize attention for locating calendar in midst of paperwork, with second attempt required. Usual mod A required to use calendar this date. SLP utilized visual cues to optimize attention and recall for orientation (crossing out past dates, highlighting information). Recommended 3-ring binder for improved organization of paperwork for improved attention and recall.  Targeted medication management task with his medication list and his empty bill box. Pt able to verbalize appropriate placement and amounts with 90% accuracy. Intermittent fading to rare cues required to read entirety or instructions. Cued visual markers were beneficial to aid pt attention of med list (check marks and paper guide).  11-25-22: Reported recent personal event which was distressing patient. SLP re-educated impact of internal/external distractions on attention and how to manage to reduce impact on focused attention. Today, pt required occasional min-mod A and extra processing time to re-direct attention back to conversation x3 today. Pt exhibiting some slow improvements in safety awareness with use of trained techniques, with pt reporting locking barn d/t safety concern. Some discrepancies in appointments and disorientation exhibited. SLP cued  patient to locate calendar, in which pt flipped past calendar x2 indicating reduced awareness and seemingly decreased attention/recall of target. Max A required to locate calendar. Min A required to ID relevant information on calendar. Pt reportedly managing his medications independently. Told SLP he bought extra pill box to fill out two weeks at a time. Based on patient reduced attention to detail and impaired error awareness, facilitated task to verbally sort novel medications x3. Usual re-reading of pill bottle required to aid recall of instructions, with usual mod to max A required to ID placement  given unfamiliar pill box. Intermittent mod cues required to place previous bottle out of the way to reduce repetition. Requested pt bring his typed list of current medications and new empty pill box to practice next session given functional implications.    PATIENT EDUCATION: Education details: see above Person educated: Patient Education method: Programmer, multimedia, Demonstration, Verbal cues, and Handouts Education comprehension: verbalized understanding, returned demonstration, and needs further education   GOALS: Goals reviewed with patient? Yes  SHORT TERM GOALS: Target date: 12/05/2022  Complete CLQT and PROM first 1-2 sessions with thorough discussion of results to heighten pt awareness  Baseline:  Goal status: MET  2.  Pt will follow multi-step instructions to complete functional task with ~90% accuracy given occasional min A for 2/2 opportunities  Baseline: 11-28-22 Goal status: PARTIALLY MET  3.  Pt will demonstrate increased attention to detail on structured iADL tasks with 3 or less errors exhibited given occasional min A  Baseline: 12/05/22 Goal status: PARTIALLY MET  4.  Pt will demonstrate increased safety awareness by verbalizing problem and solution prior to initiating task for 2/2 opportunities  Baseline: 11-19-22 Goal status: PARTIALLY MET  5.   Pt will utilize processing/attention  strategies in short structured conversations to complete entirety of thought for 80% of messages given occasional min A  Baseline: 11-19-22, 11-21-22 Goal status: MET  LONG TERM GOALS: Target date: 01/30/2023  Pt will successfully manage iADLs (medications, finances, appointments) with use of memory aids and compensations given rare min A form wife Baseline:  Goal status: IN PROGRESS  2.  Pt will demonstrate improved safety awareness and attention to detail with less frequent A from wife to ID safety concerns and complete functional tasks given rare min A  Baseline:  Goal status: IN PROGRESS  3.  Pt will utilize processing/attention strategies in extended unstructured conversations to complete entirety of thoughts for ~90% of messages given rare min A Baseline:  Goal status: IN PROGRESS  4.  Pt will demonstrate [facility] verbal expression with no evidence of anomia/dysnomia (compensate as needed) in 30+ minute conversations x2 Baseline:  Goal status: IN PROGRESS   ASSESSMENT:  CLINICAL IMPRESSION: Patient is a 77 y.o. male who was seen today for cognitive linguistic rehabilitation following SDH from fall in July 2023. Continues to present with ongoing difficulty with cognition (memory, attention, executive functioning) and communication (expressive and receptive language) impacting his ability to independently manage his affairs and converse effectively, despite repetitive instruction and training of recommended techniques. Continued additional education and training to aid pt communication. Given functional impact and increased frustration for patient and wife, pt would benefit from skilled ST intervention to optimize cognitive functioning to maximize return to prior baseline.   OBJECTIVE IMPAIRMENTS: include attention, memory, awareness, and executive functioning. These impairments are limiting patient from managing medications, managing finances, and household responsibilities. Factors  affecting potential to achieve goals and functional outcome are ability to learn/carryover information. Patient will benefit from skilled SLP services to address above impairments and improve overall function.  REHAB POTENTIAL: Good  PLAN:  SLP FREQUENCY: 2x/week  SLP DURATION: 12 weeks  PLANNED INTERVENTIONS: Language facilitation, Environmental controls, Cueing hierachy, Cognitive reorganization, Internal/external aids, Functional tasks, Multimodal communication approach, SLP instruction and feedback, Compensatory strategies, and Patient/family education    Gracy Racer, CCC-SLP 01/26/2023, 8:51 AM

## 2023-01-27 NOTE — Therapy (Signed)
OUTPATIENT SPEECH LANGUAGE PATHOLOGY TREATMENT   Patient Name: Evan [name] MRN: [id number] DOB:01-22-1946, 77 y.o., male Today's Date: 01/28/2023  PCP: Marden Noble MD REFERRING PROVIDER: Tia Alert MD  END OF SESSION:  End of Session - 01/28/23 0937     Visit Number 17    Number of Visits 25    Date for SLP Re-Evaluation 01/30/23    Authorization Type [employer]    Authorization Time Period 12 ST addl visits apprd - 12/23/21    Authorization - Visit Number 4    Authorization - Number of Visits 12    SLP Start Time [phone]    SLP Stop Time  1017    SLP Time Calculation (min) 41 min    Activity Tolerance Patient tolerated treatment well                      Past Medical History:  Diagnosis Date   Acquired dilation of ascending aorta and aortic root (HCC)    a.) measurements by TTE in 04/2021 --> root 40 mm and ascending aorta 43 mm   Anginal pain (HCC)    Anxiety    Atrial flutter (HCC) 04/22/2021   a.) CHADS2VASc = 5 (age x 2, HTN, T2DM, CAD); b.) daily warfarin   Carotid disease, bilateral (HCC)    a.) Doppler in 2019 --> 1-39% stenosis on RIGHT and 40-58% on LEFT   Cataracts, bilateral    Chronic anticoagulation    Warfarin   Chronic renal insufficiency    Coronary artery disease    a.) s/p 3v CABG in 1999   Depression    GERD (gastroesophageal reflux disease)    History of kidney stones    Hypercholesteremia    Hypertension    Kidney stone    Low testosterone    on TRT injections   Malignant melanoma (HCC)    a.) face, nose, left leg; b.) Tx'd with chemotherapy + XRT at [facility]   Myocardial infarction [facility])    Pulmonary emboli (HCC)    S/P CABG x 3 1999   a.) LIMA-LAD, SVG-RCA, SVG-diagnonal   Sleep apnea    not using nocturnal PAP therapy   T2DM (type 2 diabetes mellitus) (HCC)    Past Surgical History:  Procedure Laterality Date   CARDIAC CATHETERIZATION     x3     last 1999   CARDIOVERSION N/A 08/12/2021    Procedure: CARDIOVERSION;  Surgeon: Pricilla Riffle, MD;  Location: [facility] ENDOSCOPY;  Service: Cardiovascular;  Laterality: N/A;   CATARACT EXTRACTION Bilateral    COLONOSCOPY WITH PROPOFOL N/A 09/26/2014   Procedure: COLONOSCOPY WITH PROPOFOL;  Surgeon: Charolett Bumpers, MD;  Location: [facility] ENDOSCOPY;  Service: Endoscopy;  Laterality: N/A;   CORONARY ARTERY BYPASS GRAFT N/A 1999   3v; LIMA-LAD, SVG-RCA, SVG-diagonal   HOLEP-LASER ENUCLEATION OF THE PROSTATE WITH MORCELLATION N/A 06/07/2021   Procedure: HOLEP-LASER ENUCLEATION OF THE PROSTATE WITH MORCELLATION;  Surgeon: Sondra Come, MD;  Location: [facility] ORS;  Service: Urology;  Laterality: N/A;   KNEE ARTHROSCOPY Left    LAPAROSCOPIC CHOLECYSTECTOMY     MASS EXCISION  04/15/2012   Procedure: EXCISION MASS;  Surgeon: Suzanna Obey, MD;  Location: [facility] OR;  Service: ENT;  Laterality: Left;  Left tonsil biopsy   melanomia     several skin ca removed-nose, left ankle, parotid gland left, right nodes-being followed annually..   Patient Active Problem List   Diagnosis Date Noted   SDH (  subdural hematoma) (HCC) 04/11/2022   Fall (on)(from) sidewalk curb, initial encounter 04/11/2022   History of melanoma excision 04/11/2022   Diabetes mellitus type 2 in obese 04/11/2022   DNR (do not resuscitate) 04/11/2022   Chronic anticoagulation 06/30/2021   Typical atrial flutter (HCC) 06/28/2021   Pure hypercholesterolemia 12/23/2013   Carotid artery disease (HCC) 12/23/2013   Essential hypertension, benign 12/23/2013   Angina decubitus 12/23/2013   Coronary artery disease    Cancer (HCC)    Depression    Pulmonary emboli (HCC)    Hypercholesteremia    Kidney stone    Chronic renal insufficiency     ONSET DATE: 04/11/2022 (referral=11/06/22)  REFERRING DIAG: S06.5XAA- Traumatic subdural hemorrhage with loss of consciousness   THERAPY DIAG: Cognitive communication deficit  Rationale for Evaluation and Treatment: Rehabilitation  SUBJECTIVE:    SUBJECTIVE STATEMENT: "I was running late" Pt accompanied by: self  PERTINENT HISTORY: 77 y.o. male presents to [facility] hospital on 7/14 after falling. Pt found to have small parafalcine SDH and right tentorial SDH , reporting nausea, HA, and dizziness. PMH:  includes anxiety, aflutter, depression, HTN, PE, CABG, DMII.   PAIN: Are you having pain? No  FALLS: Has patient fallen in last 6 months?  Yes, Number of falls: 3-4 (tripping)  PATIENT GOALS: "I want to be able to figure things out on my own. I want to confidently talk with people and not feel like I have to run and hide "   OBJECTIVE:   TODAY'S TREATMENT:                                                                                                                                         01-28-23: Errors noted on homework despite explicit written instructions to optimize descriptions for word retrieval. Pt continues to use one word or vague response to describe item despite repetitive instruction and practice. Modeling and re-instruction was ineffective so provided alternate strategy for synonyms for word retrieval. Occasional mod A required to ID appropriate synonyms on structured task. Endorsed difficulty recalling name of new PCP. SLP generated written aid "memory cheat sheet" to write down pertinent information that pt has difficulty consistently recalling. Reported good carryover of to-do lists and pre-planning with wife as previously instructed. Suggested wife participate in therapy sessions to aid carryover and provide caregiver education.    01-26-23: Reviewed homework to use description strategy for word retrieval, with noted difficulty understanding and implementing compensation. SLP re-educated rationale and structured techniques to optimize describing ability (SFA, written prompts, modeling) to aid word finding. Benefited from usual fading to occasional modeling and verbal cues to optimize specificity of descriptions. Usual extra  processing time was beneficial for word retrieval and comprehension.   01-21-23: Endorsed difficulty alternating attention to transfer information between printed calendar and personal home calendar. Initial max A required to orient to today's date; recommended use of phone to aid  orientation. Good carryover of phone exhibited throughout session when cued. Targeted transferring information from one written calendar to another with usual fading to occasional max A required to complete task. Overt errors noted x3 which pt did not ID even with cued double check. Given ongoing overt difficulty managing calendar due to impaired attention, recall, and awareness, recommended increased assistance from wife as pt unable to carryover trained techniques independently. Reported some use of trained description strategy to aid anomia with less frustration reported. Reviewed descriptions written for homework with usual max cues required to increase specificity. Updated HEP to optimize descriptions for additional practice.  01-12-23: Returned after nearly 1 month d/t awaiting auth for additional ST visits. In verbal recap of last month, pt reported he "flunked" driving test which resulted in revoking of driver's license. Also reported missing several hearing aid appointments due to life situations. Endorsed good carryover of calendar and memory supports to aid recall, with success indicated. Cued recall of recent ED event, in which pt endorsed possible psychosis. Some difficulty with thought organization and verbal expression noted today in conversation. Conversational speech c/b intermittent vague language and occasional anomia. Pt stated "there are some words I can't get out." Targeted word retrieval strategies and SFA this session to optimize communication effectiveness. Usual mod A required to aid comprehension and carryover.   12-19-22: Able to demo improved good problem solving, in which pt identified appropriate hearing  clinic with appointment scheduled. Cued patient to write appointment on calendar. Min A required to ID correct date (19th vs 29th). Targeted problem solving to determine if this clinic accepts his insurance. Prompted using computer to investigate. Intermittent min cues required to maintain attention to targeted subject. Educated and instructed attention/processing/comprehension strategies to aid patient participation in conversation. Identified strategies x4 to implement at home with wife to repair communication breakdown. Due to time constraints, word finding strategies to be educated and trained next session.    12-17-22: Pt has demonstrated increased ability to manage medications more effectively with revision of updated personal medication list and double checking per SLP recommendations. Reported progress with memory but endorsed difficulty in conversation, particularly over the phone. Deduced difficulty stems from hearing impairment and auditory processing/comprehension. Targeted functional problem solving to address hearing impairment with use of visual aid. With usual prompting to deduce possible solutions, pt able to ID best solution (see audiologist). Pt able to sequence appropriate next steps with usual min A. SLP initiated education and instruction of attention/auditory comprehension strategies to aid attention, recall, and understanding in conversation. Pt would benefit from additional training to aid receptive and expressive language as pt noted with intermittent difficulty with word retrieval and answering SLP questions in conversation today.   12-12-22: Disorientation exhibited upon entrance. SLP provided mod prompting and cues to ID locate correct date using established visual aids in binder. Errors noted on calendar (not marking off days & marking off days in future). Re-education, verbal/visual cues, and written prompt provided to aid recall and carryover for future use. Identified appropriate  solution to look up time difference for personal matter. Max A required to calculate time difference between now and in [place]. Re-attempted medication task for error awareness, attention, recall, and problem solving. Pt completed with 50% accuracy independently. Intermittent mod A required to ID errors and min A required to verbally correct errors. Pt would continue to benefit from skilled ST intervention given ongoing cognitive deficits.   12-10-22: Double checked time calculation worksheet for hwk, with 66% accuracy achieved independently. Frequent  mod A required to correct errors. Benefited from visual aids (clock and written numbers) and verbal cues to break down calculations. In discussion, some prior difficulty with calculations reported which appears in part to impact current performance. Discussed functional problem solving at home, in which usual prompting required to ID functional solutions. Began targeting error awareness for medication task, in which usual extended processing time required for comprehension and reduced attention noted. Plan to continue next session.   12-05-22: Improved physical and mental organization exhibited with use of binder. Error noted on to-do list, in which pt able to ID error and correct with min A. Occasional mod A required to appropriately use written aids (to-do lists versus calendar). Completed functional time calculation homework. SLP assisted with double checking hwk. Independently, pt answered time questions with 59% accuracy. With max fading to min verbal/visual cues, accuracy improved to 100%. Visual aids were most effective to optimize understanding, recall, and calculations. Recommended pt double check additional page as part of hwk to heighten error awareness. Double checked/cross referenced personal med list with list provided by MD. Pt able to ID 2 discrepancies with min A. Recommended pt determine discrepancy at home and correct errors as needed on  documentation.   12-03-22: Indicated safety concern with pt reportedly rushing to arrive on time to today's appointment. Of note, pt arrived nearly 15 minutes early according to check-in time. Educated patient on safety concerns and more appropriate solutions (ex: call to alert of late arrival versus rush to get here). Overt difficulty with time management and time manipulation demonstrated, as pt reported needing to leave therapy early for appointment over 2 hours in future. Targeted functional time calculations between appointments, with usual max A required to add/subtract time between appointments and to determine appropriate departure times. Visual aids (written time and clock) were mildly beneficial to improve pt understanding and accuracy. As recommended, pt provided binder to organize therapy materials to aid organization, recall, and attention. Errors on calendar noted, in which pt marked out 3 future dates. Pt recognized error with mod verbal prompt. Usual max A required to re-orient to today's date and correct errors. Intermittent mod fading to min A required to locate targeted items within binder during structured practice. Provided time calculations as HEP.    11-28-22: Pt reported initial early arrival nearly 1.5 hours ahead for 9:30 appointment, in which pt was cued of error by empty parking lot. Re-directed patient to calendar and visual aids indicating appointment time. Of note, pt had check marked the appointment time and needed items on his handout, indicating initial awareness but reduced delayed recall. Verbal cues provided to slow rate to optimize attention for locating calendar in midst of paperwork, with second attempt required. Usual mod A required to use calendar this date. SLP utilized visual cues to optimize attention and recall for orientation (crossing out past dates, highlighting information). Recommended 3-ring binder for improved organization of paperwork for improved attention and  recall.  Targeted medication management task with his medication list and his empty bill box. Pt able to verbalize appropriate placement and amounts with 90% accuracy. Intermittent fading to rare cues required to read entirety or instructions. Cued visual markers were beneficial to aid pt attention of med list (check marks and paper guide).  11-25-22: Reported recent personal event which was distressing patient. SLP re-educated impact of internal/external distractions on attention and how to manage to reduce impact on focused attention. Today, pt required occasional min-mod A and extra processing time to re-direct attention  back to conversation x3 today. Pt exhibiting some slow improvements in safety awareness with use of trained techniques, with pt reporting locking barn d/t safety concern. Some discrepancies in appointments and disorientation exhibited. SLP cued patient to locate calendar, in which pt flipped past calendar x2 indicating reduced awareness and seemingly decreased attention/recall of target. Max A required to locate calendar. Min A required to ID relevant information on calendar. Pt reportedly managing his medications independently. Told SLP he bought extra pill box to fill out two weeks at a time. Based on patient reduced attention to detail and impaired error awareness, facilitated task to verbally sort novel medications x3. Usual re-reading of pill bottle required to aid recall of instructions, with usual mod to max A required to ID placement given unfamiliar pill box. Intermittent mod cues required to place previous bottle out of the way to reduce repetition. Requested pt bring his typed list of current medications and new empty pill box to practice next session given functional implications.    PATIENT EDUCATION: Education details: see above Person educated: Patient Education method: Programmer, multimedia, Demonstration, Verbal cues, and Handouts Education comprehension: verbalized understanding,  returned demonstration, and needs further education   GOALS: Goals reviewed with patient? Yes  SHORT TERM GOALS: Target date: 12/05/2022  Complete CLQT and PROM first 1-2 sessions with thorough discussion of results to heighten pt awareness  Baseline:  Goal status: MET  2.  Pt will follow multi-step instructions to complete functional task with ~90% accuracy given occasional min A for 2/2 opportunities  Baseline: 11-28-22 Goal status: PARTIALLY MET  3.  Pt will demonstrate increased attention to detail on structured iADL tasks with 3 or less errors exhibited given occasional min A  Baseline: 12/05/22 Goal status: PARTIALLY MET  4.  Pt will demonstrate increased safety awareness by verbalizing problem and solution prior to initiating task for 2/2 opportunities  Baseline: 11-19-22 Goal status: PARTIALLY MET  5.   Pt will utilize processing/attention strategies in short structured conversations to complete entirety of thought for 80% of messages given occasional min A  Baseline: 11-19-22, 11-21-22 Goal status: MET  LONG TERM GOALS: Target date: 01/30/2023  Pt will successfully manage iADLs (medications, finances, appointments) with use of memory aids and compensations given rare min A form wife Baseline:  Goal status: IN PROGRESS  2.  Pt will demonstrate improved safety awareness and attention to detail with less frequent A from wife to ID safety concerns and complete functional tasks given rare min A  Baseline:  Goal status: IN PROGRESS  3.  Pt will utilize processing/attention strategies in extended unstructured conversations to complete entirety of thoughts for ~90% of messages given rare min A Baseline:  Goal status: IN PROGRESS  4.  Pt will demonstrate [facility] verbal expression with no evidence of anomia/dysnomia (compensate as needed) in 30+ minute conversations x2 Baseline:  Goal status: IN PROGRESS   ASSESSMENT:  CLINICAL IMPRESSION: Patient is a 77 y.o. male who was seen  today for cognitive linguistic rehabilitation following SDH from fall in July 2023. Continues to present with ongoing difficulty with cognition (memory, attention, executive functioning) and communication (expressive and receptive language) impacting his ability to independently manage his affairs and converse effectively, despite repetitive instruction and training of recommended techniques. Continued additional education and training to aid pt communication. Given functional impact and increased frustration for patient and wife, pt would benefit from skilled ST intervention to optimize cognitive functioning to maximize return to prior baseline.   OBJECTIVE IMPAIRMENTS: include attention,  memory, awareness, and executive functioning. These impairments are limiting patient from managing medications, managing finances, and household responsibilities. Factors affecting potential to achieve goals and functional outcome are ability to learn/carryover information. Patient will benefit from skilled SLP services to address above impairments and improve overall function.  REHAB POTENTIAL: Good  PLAN:  SLP FREQUENCY: 2x/week  SLP DURATION: 12 weeks  PLANNED INTERVENTIONS: Language facilitation, Environmental controls, Cueing hierachy, Cognitive reorganization, Internal/external aids, Functional tasks, Multimodal communication approach, SLP instruction and feedback, Compensatory strategies, and Patient/family education    Gracy Racer, CCC-SLP 01/28/2023, 11:10 AM

## 2023-01-28 ENCOUNTER — Ambulatory Visit: Payer: Worker's Compensation | Attending: Neurological Surgery

## 2023-01-28 DIAGNOSIS — R41841 Cognitive communication deficit: Secondary | ICD-10-CM | POA: Diagnosis present

## 2023-01-30 ENCOUNTER — Other Ambulatory Visit: Payer: Self-pay | Admitting: Internal Medicine

## 2023-01-30 ENCOUNTER — Ambulatory Visit
Admission: RE | Admit: 2023-01-30 | Discharge: 2023-01-30 | Disposition: A | Discharge status: Home / Self Care | Payer: Medicare PPO | Source: Ambulatory Visit | Attending: Internal Medicine | Admitting: Internal Medicine

## 2023-01-30 DIAGNOSIS — E559 Vitamin D deficiency, unspecified: Secondary | ICD-10-CM | POA: Diagnosis not present

## 2023-01-30 DIAGNOSIS — Z79899 Other long term (current) drug therapy: Secondary | ICD-10-CM | POA: Diagnosis not present

## 2023-01-30 DIAGNOSIS — I483 Typical atrial flutter: Secondary | ICD-10-CM | POA: Diagnosis not present

## 2023-01-30 DIAGNOSIS — I25118 Atherosclerotic heart disease of native coronary artery with other forms of angina pectoris: Secondary | ICD-10-CM | POA: Diagnosis not present

## 2023-01-30 DIAGNOSIS — N1832 Chronic kidney disease, stage 3b: Secondary | ICD-10-CM | POA: Diagnosis not present

## 2023-01-30 DIAGNOSIS — E1142 Type 2 diabetes mellitus with diabetic polyneuropathy: Secondary | ICD-10-CM | POA: Diagnosis not present

## 2023-01-30 DIAGNOSIS — E291 Testicular hypofunction: Secondary | ICD-10-CM | POA: Diagnosis not present

## 2023-01-30 DIAGNOSIS — F331 Major depressive disorder, recurrent, moderate: Secondary | ICD-10-CM | POA: Diagnosis not present

## 2023-01-30 DIAGNOSIS — I4891 Unspecified atrial fibrillation: Secondary | ICD-10-CM | POA: Diagnosis not present

## 2023-01-30 DIAGNOSIS — E1122 Type 2 diabetes mellitus with diabetic chronic kidney disease: Secondary | ICD-10-CM | POA: Diagnosis not present

## 2023-01-30 DIAGNOSIS — R413 Other amnesia: Secondary | ICD-10-CM | POA: Diagnosis not present

## 2023-01-30 DIAGNOSIS — Z7901 Long term (current) use of anticoagulants: Secondary | ICD-10-CM | POA: Diagnosis not present

## 2023-01-30 DIAGNOSIS — R404 Transient alteration of awareness: Secondary | ICD-10-CM

## 2023-01-30 DIAGNOSIS — Z0000 Encounter for general adult medical examination without abnormal findings: Secondary | ICD-10-CM | POA: Diagnosis not present

## 2023-01-30 DIAGNOSIS — D6869 Other thrombophilia: Secondary | ICD-10-CM | POA: Diagnosis not present

## 2023-01-30 DIAGNOSIS — Z1331 Encounter for screening for depression: Secondary | ICD-10-CM | POA: Diagnosis not present

## 2023-01-30 DIAGNOSIS — R42 Dizziness and giddiness: Secondary | ICD-10-CM | POA: Diagnosis not present

## 2023-01-30 DIAGNOSIS — I10 Essential (primary) hypertension: Secondary | ICD-10-CM | POA: Diagnosis not present

## 2023-01-31 ENCOUNTER — Other Ambulatory Visit: Payer: Self-pay | Admitting: Internal Medicine

## 2023-01-31 DIAGNOSIS — R413 Other amnesia: Secondary | ICD-10-CM

## 2023-02-04 ENCOUNTER — Ambulatory Visit: Payer: Worker's Compensation

## 2023-02-04 DIAGNOSIS — R41841 Cognitive communication deficit: Secondary | ICD-10-CM | POA: Diagnosis not present

## 2023-02-04 NOTE — Therapy (Signed)
OUTPATIENT SPEECH LANGUAGE PATHOLOGY TREATMENT (RECERT)  Patient Name: Evan [name] MRN: [id number] DOB:02-Aug-1946, 77 y.o., male Today's Date: 02/04/2023  PCP: Marden Noble MD REFERRING PROVIDER: Tia Alert MD  END OF SESSION:  End of Session - 02/04/23 0929     Visit Number 18    Number of Visits 25    Date for SLP Re-Evaluation 03/04/23   recert   Authorization Type [employer]    Authorization Time Period 12 ST addl visits apprd - 12/23/21    Authorization - Visit Number 5    Authorization - Number of Visits 12    SLP Start Time 0930    SLP Stop Time  1018    SLP Time Calculation (min) 48 min    Activity Tolerance Patient tolerated treatment well                       Past Medical History:  Diagnosis Date   Acquired dilation of ascending aorta and aortic root (HCC)    a.) measurements by TTE in 04/2021 --> root 40 mm and ascending aorta 43 mm   Anginal pain (HCC)    Anxiety    Atrial flutter (HCC) 04/22/2021   a.) CHADS2VASc = 5 (age x 2, HTN, T2DM, CAD); b.) daily warfarin   Carotid disease, bilateral (HCC)    a.) Doppler in 2019 --> 1-39% stenosis on RIGHT and 40-58% on LEFT   Cataracts, bilateral    Chronic anticoagulation    Warfarin   Chronic renal insufficiency    Coronary artery disease    a.) s/p 3v CABG in 1999   Depression    GERD (gastroesophageal reflux disease)    History of kidney stones    Hypercholesteremia    Hypertension    Kidney stone    Low testosterone    on TRT injections   Malignant melanoma (HCC)    a.) face, nose, left leg; b.) Tx'd with chemotherapy + XRT at [facility]   Myocardial infarction [facility])    Pulmonary emboli (HCC)    S/P CABG x 3 1999   a.) LIMA-LAD, SVG-RCA, SVG-diagnonal   Sleep apnea    not using nocturnal PAP therapy   T2DM (type 2 diabetes mellitus) (HCC)    Past Surgical History:  Procedure Laterality Date   CARDIAC CATHETERIZATION     x3     last 1999   CARDIOVERSION  N/A 08/12/2021   Procedure: CARDIOVERSION;  Surgeon: Pricilla Riffle, MD;  Location[facility] ENDOSCOPY;  Service: Cardiovascular;  Laterality: N/A;   CATARACT EXTRACTION Bilateral    COLONOSCOPY WITH PROPOFOL N/A 09/26/2014   Procedure: COLONOSCOPY WITH PROPOFOL;  Surgeon: Charolett Bumpers, MD;  Location: [facility] ENDOSCOPY;  Service: Endoscopy;  Laterality: N/A;   CORONARY ARTERY BYPASS GRAFT N/A 1999   3v; LIMA-LAD, SVG-RCA, SVG-diagonal   HOLEP-LASER ENUCLEATION OF THE PROSTATE WITH MORCELLATION N/A 06/07/2021   Procedure: HOLEP-LASER ENUCLEATION OF THE PROSTATE WITH MORCELLATION;  Surgeon: Sondra Come, MD;  Location: [facility] ORS;  Service: Urology;  Laterality: N/A;   KNEE ARTHROSCOPY Left    LAPAROSCOPIC CHOLECYSTECTOMY     MASS EXCISION  04/15/2012   Procedure: EXCISION MASS;  Surgeon: Suzanna Obey, MD;  Location: [facility] OR;  Service: ENT;  Laterality: Left;  Left tonsil biopsy   melanomia     several skin ca removed-nose, left ankle, parotid gland left, right nodes-being followed annually..   Patient Active Problem List   Diagnosis Date Noted  SDH (subdural hematoma) (HCC) 04/11/2022   Fall (on)(from) sidewalk curb, initial encounter 04/11/2022   History of melanoma excision 04/11/2022   Diabetes mellitus type 2 in obese 04/11/2022   DNR (do not resuscitate) 04/11/2022   Chronic anticoagulation 06/30/2021   Typical atrial flutter (HCC) 06/28/2021   Pure hypercholesterolemia 12/23/2013   Carotid artery disease (HCC) 12/23/2013   Essential hypertension, benign 12/23/2013   Angina decubitus 12/23/2013   Coronary artery disease    Cancer (HCC)    Depression    Pulmonary emboli (HCC)    Hypercholesteremia    Kidney stone    Chronic renal insufficiency     ONSET DATE: 04/11/2022 (referral=11/06/22)  REFERRING DIAG: S06.5XAA- Traumatic subdural hemorrhage with loss of consciousness   THERAPY DIAG: Cognitive communication deficit  Rationale for Evaluation and Treatment:  Rehabilitation  SUBJECTIVE:   SUBJECTIVE STATEMENT: "Doing well" Pt accompanied by: self  PERTINENT HISTORY: 77 y.o. male presents to [facility] hospital on 7/14 after falling. Pt found to have small parafalcine SDH and right tentorial SDH , reporting nausea, HA, and dizziness. PMH:  includes anxiety, aflutter, depression, HTN, PE, CABG, DMII.   PAIN: Are you having pain? No  FALLS: Has patient fallen in last 6 months?  Yes, Number of falls: 3-4 (tripping)  PATIENT GOALS: "I want to be able to figure things out on my own. I want to confidently talk with people and not feel like I have to run and hide "   OBJECTIVE:   TODAY'S TREATMENT:                                                                                                                                         02-04-23: Continues to come alone despite recommendation for wife to attend ST. No additional information added to "memory cheat sheet" other than PCP name from last session. Dicussed recent PCP appointment with reduced recall noted. Addressed mentioned concerns pertinent to ST, including memory loss (I.e., forgetting medications) and overall reduced awareness. Educated and instructed pertinent cognitive compensations and recommendations to aid completion of iADLs (recommending wife supervision and assistance with meds) and recall of functional information. Targeted writing down information given auditory stimuli (voicemails), with frequent repetition required to aid accuracy of recall. Recertification completed for remaining visits with LTGs downgraded d/t persistent cognitive challenges despite SLP intervention, which may be indicative of underlying memory/cognitive impairment.   01-28-23: Errors noted on homework despite explicit written instructions to optimize descriptions for word retrieval. Pt continues to use one word or vague response to describe item despite repetitive instruction and practice. Modeling and re-instruction was  ineffective so provided alternate strategy for synonyms for word retrieval. Occasional mod A required to ID appropriate synonyms on structured task. Endorsed difficulty recalling name of new PCP. SLP generated written aid "memory cheat sheet" to write down pertinent information that pt has difficulty consistently recalling. Reported good carryover of to-do  lists and pre-planning with wife as previously instructed. Suggested wife participate in therapy sessions to aid carryover and provide caregiver education.    01-26-23: Reviewed homework to use description strategy for word retrieval, with noted difficulty understanding and implementing compensation. SLP re-educated rationale and structured techniques to optimize describing ability (SFA, written prompts, modeling) to aid word finding. Benefited from usual fading to occasional modeling and verbal cues to optimize specificity of descriptions. Usual extra processing time was beneficial for word retrieval and comprehension.   01-21-23: Endorsed difficulty alternating attention to transfer information between printed calendar and personal home calendar. Initial max A required to orient to today's date; recommended use of phone to aid orientation. Good carryover of phone exhibited throughout session when cued. Targeted transferring information from one written calendar to another with usual fading to occasional max A required to complete task. Overt errors noted x3 which pt did not ID even with cued double check. Given ongoing overt difficulty managing calendar due to impaired attention, recall, and awareness, recommended increased assistance from wife as pt unable to carryover trained techniques independently. Reported some use of trained description strategy to aid anomia with less frustration reported. Reviewed descriptions written for homework with usual max cues required to increase specificity. Updated HEP to optimize descriptions for additional  practice.  01-12-23: Returned after nearly 1 month d/t awaiting auth for additional ST visits. In verbal recap of last month, pt reported he "flunked" driving test which resulted in revoking of driver's license. Also reported missing several hearing aid appointments due to life situations. Endorsed good carryover of calendar and memory supports to aid recall, with success indicated. Cued recall of recent ED event, in which pt endorsed possible psychosis. Some difficulty with thought organization and verbal expression noted today in conversation. Conversational speech c/b intermittent vague language and occasional anomia. Pt stated "there are some words I can't get out." Targeted word retrieval strategies and SFA this session to optimize communication effectiveness. Usual mod A required to aid comprehension and carryover.   12-19-22: Able to demo improved good problem solving, in which pt identified appropriate hearing clinic with appointment scheduled. Cued patient to write appointment on calendar. Min A required to ID correct date (19th vs 29th). Targeted problem solving to determine if this clinic accepts his insurance. Prompted using computer to investigate. Intermittent min cues required to maintain attention to targeted subject. Educated and instructed attention/processing/comprehension strategies to aid patient participation in conversation. Identified strategies x4 to implement at home with wife to repair communication breakdown. Due to time constraints, word finding strategies to be educated and trained next session.    12-17-22: Pt has demonstrated increased ability to manage medications more effectively with revision of updated personal medication list and double checking per SLP recommendations. Reported progress with memory but endorsed difficulty in conversation, particularly over the phone. Deduced difficulty stems from hearing impairment and auditory processing/comprehension. Targeted functional  problem solving to address hearing impairment with use of visual aid. With usual prompting to deduce possible solutions, pt able to ID best solution (see audiologist). Pt able to sequence appropriate next steps with usual min A. SLP initiated education and instruction of attention/auditory comprehension strategies to aid attention, recall, and understanding in conversation. Pt would benefit from additional training to aid receptive and expressive language as pt noted with intermittent difficulty with word retrieval and answering SLP questions in conversation today.   PATIENT EDUCATION: Education details: see above Person educated: Patient Education method: Explanation, Facilities manager, Verbal cues, and Handouts Education  comprehension: verbalized understanding, returned demonstration, and needs further education   GOALS: Goals reviewed with patient? Yes  SHORT TERM GOALS: Target date: 12/05/2022  Complete CLQT and PROM first 1-2 sessions with thorough discussion of results to heighten pt awareness  Baseline:  Goal status: MET  2.  Pt will follow multi-step instructions to complete functional task with ~90% accuracy given occasional min A for 2/2 opportunities  Baseline: 11-28-22 Goal status: PARTIALLY MET  3.  Pt will demonstrate increased attention to detail on structured iADL tasks with 3 or less errors exhibited given occasional min A  Baseline: 12/05/22 Goal status: PARTIALLY MET  4.  Pt will demonstrate increased safety awareness by verbalizing problem and solution prior to initiating task for 2/2 opportunities  Baseline: 11-19-22 Goal status: PARTIALLY MET  5.   Pt will utilize processing/attention strategies in short structured conversations to complete entirety of thought for 80% of messages given occasional min A  Baseline: 11-19-22, 11-21-22 Goal status: MET  LONG TERM GOALS: Target date: 03/04/23 (RECERT)  Pt will successfully manage iADLs (medications, finances, appointments)  with use of memory aids and compensations given supervision from wife Baseline:  Goal status: IN PROGRESS (DOWNGRADED at [facility])  2.  Pt will demonstrate improved safety awareness and attention to detail with less frequent A from wife to ID safety concerns and complete functional tasks given supervision from wife Baseline:  Goal status: IN PROGRESS (DOWNGRADED at [facility])  3.  Pt will utilize processing/attention strategies in extended unstructured conversations to complete entirety of thoughts for ~90% of messages given intermittent min A Baseline:  Goal status: IN PROGRESS (DOWNGRADED at [facility])  4.  Pt will demonstrate [facility] verbal expression with ability to compensate for anomia/dysnomia with trained strategy given occasional min A in 30+ minute conversations  Baseline:  Goal status: IN PROGRESS (DOWNGRADED at [facility])   ASSESSMENT:  CLINICAL IMPRESSION: Patient is a 77 y.o. male who was seen today for cognitive linguistic rehabilitation following SDH from fall in July 2023. Continues to present with ongoing difficulty with cognition (memory, attention, executive functioning) and communication (expressive and receptive language) impacting his ability to independently manage his affairs and converse effectively, despite repetitive instruction and training of recommended techniques. Continued additional education and training to aid cognitive functioning. Given functional impact and increased frustration for patient and wife, pt would benefit from skilled ST intervention to optimize cognitive functioning to maximize return to prior baseline.   OBJECTIVE IMPAIRMENTS: include attention, memory, awareness, and executive functioning. These impairments are limiting patient from managing medications, managing finances, and household responsibilities. Factors affecting potential to achieve goals and functional outcome are ability to learn/carryover information. Patient will benefit from skilled SLP  services to address above impairments and improve overall function.  REHAB POTENTIAL: Good  PLAN:  SLP FREQUENCY: 2x/week  SLP DURATION: 12 weeks  PLANNED INTERVENTIONS: Language facilitation, Environmental controls, Cueing hierachy, Cognitive reorganization, Internal/external aids, Functional tasks, Multimodal communication approach, SLP instruction and feedback, Compensatory strategies, and Patient/family education    Gracy Racer, CCC-SLP 02/04/2023, 10:37 AM

## 2023-02-04 NOTE — Patient Instructions (Addendum)
I see the doctor mentioned you may have forgotten your medication when you had your annual visit. It seems you are managing your medications by yourself I would recommend your wife assist you with your medications as we have noticed reduce memory and awareness or errors in Speech Therapy.   Memory Strategies: Write down information (take notes at appointments, phone calls) Repetition is essential! Read 2-3 times to make sure you understand and help your memory Ask the person you are talking with to repeat themselves or clarify to make sure you heard it right ("I heard you say.... is that accurate?")

## 2023-02-06 ENCOUNTER — Ambulatory Visit: Payer: Worker's Compensation

## 2023-02-06 DIAGNOSIS — R41841 Cognitive communication deficit: Secondary | ICD-10-CM | POA: Diagnosis not present

## 2023-02-06 NOTE — Patient Instructions (Signed)
Cueing Hierarchy: ? ?Description - define the word or provide the function ?First letter - provide the first letter of the target word ?Spell - spell the target word, orally ?Phrase Completion - give a phrase to complete ?First sound - provide the first sound of the target word ?Model - say the word aloud to repeat ? ? ?Example target: "mug" ?The thing you put coffee in ?Starts with m ?M - U - G ?Fill my coffee _____ ?"Muh" ?Mug ?

## 2023-02-06 NOTE — Therapy (Signed)
OUTPATIENT SPEECH LANGUAGE PATHOLOGY TREATMENT   Patient Name: Evan [name] MRN: [id number] DOB:26-Mar-1946, 77 y.o., male Today's Date: 02/06/2023  PCP: Marden Noble MD REFERRING PROVIDER: Tia Alert MD  END OF SESSION:  End of Session - 02/06/23 0926     Visit Number 19    Number of Visits 25    Date for SLP Re-Evaluation 03/04/23    Authorization Type [employer]    Authorization Time Period 12 ST addl visits apprd - 12/23/21    Authorization - Visit Number 6    Authorization - Number of Visits 12    SLP Start Time 0930    SLP Stop Time  1015    SLP Time Calculation (min) 45 min    Activity Tolerance Patient tolerated treatment well                        Past Medical History:  Diagnosis Date   Acquired dilation of ascending aorta and aortic root (HCC)    a.) measurements by TTE in 04/2021 --> root 40 mm and ascending aorta 43 mm   Anginal pain (HCC)    Anxiety    Atrial flutter (HCC) 04/22/2021   a.) CHADS2VASc = 5 (age x 2, HTN, T2DM, CAD); b.) daily warfarin   Carotid disease, bilateral (HCC)    a.) Doppler in 2019 --> 1-39% stenosis on RIGHT and 40-58% on LEFT   Cataracts, bilateral    Chronic anticoagulation    Warfarin   Chronic renal insufficiency    Coronary artery disease    a.) s/p 3v CABG in 1999   Depression    GERD (gastroesophageal reflux disease)    History of kidney stones    Hypercholesteremia    Hypertension    Kidney stone    Low testosterone    on TRT injections   Malignant melanoma (HCC)    a.) face, nose, left leg; b.) Tx'd with chemotherapy + XRT at [facility]   Myocardial infarction [facility])    Pulmonary emboli (HCC)    S/P CABG x 3 1999   a.) LIMA-LAD, SVG-RCA, SVG-diagnonal   Sleep apnea    not using nocturnal PAP therapy   T2DM (type 2 diabetes mellitus) (HCC)    Past Surgical History:  Procedure Laterality Date   CARDIAC CATHETERIZATION     x3     last 1999   CARDIOVERSION N/A  08/12/2021   Procedure: CARDIOVERSION;  Surgeon: Pricilla Riffle, MD;  Location: [facility] ENDOSCOPY;  Service: Cardiovascular;  Laterality: N/A;   CATARACT EXTRACTION Bilateral    COLONOSCOPY WITH PROPOFOL N/A 09/26/2014   Procedure: COLONOSCOPY WITH PROPOFOL;  Surgeon: Charolett Bumpers, MD;  Location: [facility] ENDOSCOPY;  Service: Endoscopy;  Laterality: N/A;   CORONARY ARTERY BYPASS GRAFT N/A 1999   3v; LIMA-LAD, SVG-RCA, SVG-diagonal   HOLEP-LASER ENUCLEATION OF THE PROSTATE WITH MORCELLATION N/A 06/07/2021   Procedure: HOLEP-LASER ENUCLEATION OF THE PROSTATE WITH MORCELLATION;  Surgeon: Sondra Come, MD;  Location: [facility] ORS;  Service: Urology;  Laterality: N/A;   KNEE ARTHROSCOPY Left    LAPAROSCOPIC CHOLECYSTECTOMY     MASS EXCISION  04/15/2012   Procedure: EXCISION MASS;  Surgeon: Suzanna Obey, MD;  Location: [facility] OR;  Service: ENT;  Laterality: Left;  Left tonsil biopsy   melanomia     several skin ca removed-nose, left ankle, parotid gland left, right nodes-being followed annually..   Patient Active Problem List   Diagnosis Date Noted  SDH (subdural hematoma) (HCC) 04/11/2022   Fall (on)(from) sidewalk curb, initial encounter 04/11/2022   History of melanoma excision 04/11/2022   Diabetes mellitus type 2 in obese 04/11/2022   DNR (do not resuscitate) 04/11/2022   Chronic anticoagulation 06/30/2021   Typical atrial flutter (HCC) 06/28/2021   Pure hypercholesterolemia 12/23/2013   Carotid artery disease (HCC) 12/23/2013   Essential hypertension, benign 12/23/2013   Angina decubitus 12/23/2013   Coronary artery disease    Cancer (HCC)    Depression    Pulmonary emboli (HCC)    Hypercholesteremia    Kidney stone    Chronic renal insufficiency     ONSET DATE: 04/11/2022 (referral=11/06/22)  REFERRING DIAG: S06.5XAA- Traumatic subdural hemorrhage with loss of consciousness   THERAPY DIAG: Cognitive communication deficit  Rationale for Evaluation and Treatment:  Rehabilitation  SUBJECTIVE:   SUBJECTIVE STATEMENT: "I brought Linda" Pt accompanied by: self  PERTINENT HISTORY: 77 y.o. male presents to [facility] hospital on 7/14 after falling. Pt found to have small parafalcine SDH and right tentorial SDH , reporting nausea, HA, and dizziness. PMH:  includes anxiety, aflutter, depression, HTN, PE, CABG, DMII.   PAIN: Are you having pain? No  FALLS: Has patient fallen in last 6 months?  Yes, Number of falls: 3-4 (tripping)  PATIENT GOALS: "I want to be able to figure things out on my own. I want to confidently talk with people and not feel like I have to run and hide "   OBJECTIVE:   TODAY'S TREATMENT:                                                                                                                                         02-06-23: Wife attended session today. Provided education of clinical observations and targeted ST interventions related to ongoing cognitive and communication challenges. Conducted caregiver education re: recommended techniques to optimize recall/attention in conversation (writing notes, internal rehearsal) as well as word finding (cueing hierarchy) to reduce vague speech. Wife verbalized understanding and appreciation for recommendations. Today, targeted use of note writing to aid attention and recall in conversation. Pt able to completed with some intermittent mod A to focus on key words versus sentences.   02-04-23: Continues to come alone despite recommendation for wife to attend ST. No additional information added to "memory cheat sheet" other than PCP name from last session. Dicussed recent PCP appointment with reduced recall noted. Addressed mentioned concerns pertinent to ST, including memory loss (I.e., forgetting medications) and overall reduced awareness. Educated and instructed pertinent cognitive compensations and recommendations to aid completion of iADLs (recommending wife supervision and assistance with meds) and recall  of functional information. Targeted writing down information given auditory stimuli (voicemails), with frequent repetition required to aid accuracy of recall. Recertification completed for remaining visits with LTGs downgraded d/t persistent cognitive challenges despite SLP intervention, which may be indicative of underlying memory/cognitive impairment.   01-28-23:  Errors noted on homework despite explicit written instructions to optimize descriptions for word retrieval. Pt continues to use one word or vague response to describe item despite repetitive instruction and practice. Modeling and re-instruction was ineffective so provided alternate strategy for synonyms for word retrieval. Occasional mod A required to ID appropriate synonyms on structured task. Endorsed difficulty recalling name of new PCP. SLP generated written aid "memory cheat sheet" to write down pertinent information that pt has difficulty consistently recalling. Reported good carryover of to-do lists and pre-planning with wife as previously instructed. Suggested wife participate in therapy sessions to aid carryover and provide caregiver education.    01-26-23: Reviewed homework to use description strategy for word retrieval, with noted difficulty understanding and implementing compensation. SLP re-educated rationale and structured techniques to optimize describing ability (SFA, written prompts, modeling) to aid word finding. Benefited from usual fading to occasional modeling and verbal cues to optimize specificity of descriptions. Usual extra processing time was beneficial for word retrieval and comprehension.   01-21-23: Endorsed difficulty alternating attention to transfer information between printed calendar and personal home calendar. Initial max A required to orient to today's date; recommended use of phone to aid orientation. Good carryover of phone exhibited throughout session when cued. Targeted transferring information from one written  calendar to another with usual fading to occasional max A required to complete task. Overt errors noted x3 which pt did not ID even with cued double check. Given ongoing overt difficulty managing calendar due to impaired attention, recall, and awareness, recommended increased assistance from wife as pt unable to carryover trained techniques independently. Reported some use of trained description strategy to aid anomia with less frustration reported. Reviewed descriptions written for homework with usual max cues required to increase specificity. Updated HEP to optimize descriptions for additional practice.  01-12-23: Returned after nearly 1 month d/t awaiting auth for additional ST visits. In verbal recap of last month, pt reported he "flunked" driving test which resulted in revoking of driver's license. Also reported missing several hearing aid appointments due to life situations. Endorsed good carryover of calendar and memory supports to aid recall, with success indicated. Cued recall of recent ED event, in which pt endorsed possible psychosis. Some difficulty with thought organization and verbal expression noted today in conversation. Conversational speech c/b intermittent vague language and occasional anomia. Pt stated "there are some words I can't get out." Targeted word retrieval strategies and SFA this session to optimize communication effectiveness. Usual mod A required to aid comprehension and carryover.   12-19-22: Able to demo improved good problem solving, in which pt identified appropriate hearing clinic with appointment scheduled. Cued patient to write appointment on calendar. Min A required to ID correct date (19th vs 29th). Targeted problem solving to determine if this clinic accepts his insurance. Prompted using computer to investigate. Intermittent min cues required to maintain attention to targeted subject. Educated and instructed attention/processing/comprehension strategies to aid patient  participation in conversation. Identified strategies x4 to implement at home with wife to repair communication breakdown. Due to time constraints, word finding strategies to be educated and trained next session.    12-17-22: Pt has demonstrated increased ability to manage medications more effectively with revision of updated personal medication list and double checking per SLP recommendations. Reported progress with memory but endorsed difficulty in conversation, particularly over the phone. Deduced difficulty stems from hearing impairment and auditory processing/comprehension. Targeted functional problem solving to address hearing impairment with use of visual aid. With usual prompting to deduce  possible solutions, pt able to ID best solution (see audiologist). Pt able to sequence appropriate next steps with usual min A. SLP initiated education and instruction of attention/auditory comprehension strategies to aid attention, recall, and understanding in conversation. Pt would benefit from additional training to aid receptive and expressive language as pt noted with intermittent difficulty with word retrieval and answering SLP questions in conversation today.   PATIENT EDUCATION: Education details: see above Person educated: Patient Education method: Programmer, multimedia, Demonstration, Verbal cues, and Handouts Education comprehension: verbalized understanding, returned demonstration, and needs further education   GOALS: Goals reviewed with patient? Yes  SHORT TERM GOALS: Target date: 12/05/2022  Complete CLQT and PROM first 1-2 sessions with thorough discussion of results to heighten pt awareness  Baseline:  Goal status: MET  2.  Pt will follow multi-step instructions to complete functional task with ~90% accuracy given occasional min A for 2/2 opportunities  Baseline: 11-28-22 Goal status: PARTIALLY MET  3.  Pt will demonstrate increased attention to detail on structured iADL tasks with 3 or less errors  exhibited given occasional min A  Baseline: 12/05/22 Goal status: PARTIALLY MET  4.  Pt will demonstrate increased safety awareness by verbalizing problem and solution prior to initiating task for 2/2 opportunities  Baseline: 11-19-22 Goal status: PARTIALLY MET  5.   Pt will utilize processing/attention strategies in short structured conversations to complete entirety of thought for 80% of messages given occasional min A  Baseline: 11-19-22, 11-21-22 Goal status: MET  LONG TERM GOALS: Target date: 03/04/23 (DOWNGRADED at [facility])  Pt will successfully manage iADLs (medications, finances, appointments) with use of memory aids and compensations given supervision from wife Baseline:  Goal status: IN PROGRESS   2.  Pt will demonstrate improved safety awareness and attention to detail with less frequent A from wife to ID safety concerns and complete functional tasks given supervision from wife Baseline:  Goal status: IN PROGRESS   3.  Pt will utilize processing/attention strategies in extended unstructured conversations to complete entirety of thoughts for ~90% of messages given intermittent min A Baseline:  Goal status: IN PROGRESS   4.  Pt will demonstrate [facility] verbal expression with ability to compensate for anomia/dysnomia with trained strategy given occasional min A in 30+ minute conversations  Baseline:  Goal status: IN PROGRESS   ASSESSMENT:  CLINICAL IMPRESSION: Patient is a 77 y.o. male who was seen today for cognitive linguistic rehabilitation following SDH from fall in July 2023. Continues to present with ongoing difficulty with cognition (memory, attention, executive functioning) and communication (expressive and receptive language) impacting his ability to independently manage his affairs and converse effectively, despite repetitive instruction and training of recommended techniques. Initiated carryover education and training with wife to aid cognitive functioning and carryover at  home. Given functional impact and increased frustration for patient and wife, pt would benefit from skilled ST intervention to optimize cognitive functioning to maximize return to prior baseline.   OBJECTIVE IMPAIRMENTS: include attention, memory, awareness, and executive functioning. These impairments are limiting patient from managing medications, managing finances, and household responsibilities. Factors affecting potential to achieve goals and functional outcome are ability to learn/carryover information. Patient will benefit from skilled SLP services to address above impairments and improve overall function.  REHAB POTENTIAL: Good  PLAN:  SLP FREQUENCY: 2x/week  SLP DURATION: 12 weeks  PLANNED INTERVENTIONS: Language facilitation, Environmental controls, Cueing hierachy, Cognitive reorganization, Internal/external aids, Functional tasks, Multimodal communication approach, SLP instruction and feedback, Compensatory strategies, and Patient/family education  Gracy Racer, CCC-SLP 02/06/2023, 11:07 AM

## 2023-02-10 NOTE — Therapy (Signed)
OUTPATIENT SPEECH LANGUAGE PATHOLOGY TREATMENT (PROGRESS NOTE)  Patient Name: Evan [name] MRN: [id number] DOB:09-17-1946, 77 y.o., male Today's Date: 02/11/2023  PCP: Marden Noble MD REFERRING PROVIDER: Tia Alert MD  END OF SESSION:  End of Session - 02/11/23 0932     Visit Number 20    Number of Visits 25    Date for SLP Re-Evaluation 03/04/23    Authorization Type [employer]    Authorization Time Period 12 ST addl visits apprd - 12/23/21    Authorization - Visit Number 7    Authorization - Number of Visits 12    SLP Start Time 0930    SLP Stop Time  1014    SLP Time Calculation (min) 44 min    Activity Tolerance Patient tolerated treatment well             Speech Therapy Progress Note  Dates of Reporting Period: 12/10/22 to 02/11/23  Objective Reports of Subjective Statement: Pt has been seen for 20 ST visits targeted cognitive communication skills s/p subdural hemorrhage. Pt has reported benefit of SLP education and instruction to optimize cognitive functioning with intermittent reduced reliance on wife.   Objective Measurements: Pt has made modest progress thus far. Continues to benefit from usual repetition and written aids for recall, rephrasing and clarification for processing, and structured supports for carryover of learned techniques. Wife has begun attending ST sessions and assisting with carryover of trained techniques, which is expected to optimize progress going forward.   Goal Update: Goals recently updated at re-certification   Plan: Continue per POC  Reason Skilled Services are Required: Despite slow progress, it does not appear pt has maximized rehab potential yet. Given recent increased familial support, suspect additional progress and carryover so recommend continuing ST intervention to maximize cognitive linguistic functioning and optimize return to prior baseline.     Past Medical History:  Diagnosis Date   Acquired  dilation of ascending aorta and aortic root (HCC)    a.) measurements by TTE in 04/2021 --> root 40 mm and ascending aorta 43 mm   Anginal pain (HCC)    Anxiety    Atrial flutter (HCC) 04/22/2021   a.) CHADS2VASc = 5 (age x 2, HTN, T2DM, CAD); b.) daily warfarin   Carotid disease, bilateral (HCC)    a.) Doppler in 2019 --> 1-39% stenosis on RIGHT and 40-58% on LEFT   Cataracts, bilateral    Chronic anticoagulation    Warfarin   Chronic renal insufficiency    Coronary artery disease    a.) s/p 3v CABG in 1999   Depression    GERD (gastroesophageal reflux disease)    History of kidney stones    Hypercholesteremia    Hypertension    Kidney stone    Low testosterone    on TRT injections   Malignant melanoma (HCC)    a.) face, nose, left leg; b.) Tx'd with chemotherapy + XRT at [facility]   Myocardial infarction [facility])    Pulmonary emboli (HCC)    S/P CABG x 3 1999   a.) LIMA-LAD, SVG-RCA, SVG-diagnonal   Sleep apnea    not using nocturnal PAP therapy   T2DM (type 2 diabetes mellitus) (HCC)    Past Surgical History:  Procedure Laterality Date   CARDIAC CATHETERIZATION     x3     last 1999   CARDIOVERSION N/A 08/12/2021   Procedure: CARDIOVERSION;  Surgeon: Pricilla Riffle, MD;  Location: [facility] ENDOSCOPY;  Service: Cardiovascular;  Laterality: N/A;   CATARACT EXTRACTION Bilateral    COLONOSCOPY WITH PROPOFOL N/A 09/26/2014   Procedure: COLONOSCOPY WITH PROPOFOL;  Surgeon: Charolett Bumpers, MD;  Location: [facility] ENDOSCOPY;  Service: Endoscopy;  Laterality: N/A;   CORONARY ARTERY BYPASS GRAFT N/A 1999   3v; LIMA-LAD, SVG-RCA, SVG-diagonal   HOLEP-LASER ENUCLEATION OF THE PROSTATE WITH MORCELLATION N/A 06/07/2021   Procedure: HOLEP-LASER ENUCLEATION OF THE PROSTATE WITH MORCELLATION;  Surgeon: Sondra Come, MD;  Location: [facility] ORS;  Service: Urology;  Laterality: N/A;   KNEE ARTHROSCOPY Left    LAPAROSCOPIC CHOLECYSTECTOMY     MASS EXCISION  04/15/2012   Procedure: EXCISION MASS;  Surgeon:  Suzanna Obey, MD;  Location: [facility] OR;  Service: ENT;  Laterality: Left;  Left tonsil biopsy   melanomia     several skin ca removed-nose, left ankle, parotid gland left, right nodes-being followed annually..   Patient Active Problem List   Diagnosis Date Noted   SDH (subdural hematoma) (HCC) 04/11/2022   Fall (on)(from) sidewalk curb, initial encounter 04/11/2022   History of melanoma excision 04/11/2022   Diabetes mellitus type 2 in obese 04/11/2022   DNR (do not resuscitate) 04/11/2022   Chronic anticoagulation 06/30/2021   Typical atrial flutter (HCC) 06/28/2021   Pure hypercholesterolemia 12/23/2013   Carotid artery disease (HCC) 12/23/2013   Essential hypertension, benign 12/23/2013   Angina decubitus 12/23/2013   Coronary artery disease    Cancer (HCC)    Depression    Pulmonary emboli (HCC)    Hypercholesteremia    Kidney stone    Chronic renal insufficiency     ONSET DATE: 04/11/2022 (referral=11/06/22)  REFERRING DIAG: S06.5XAA- Traumatic subdural hemorrhage with loss of consciousness   THERAPY DIAG: Cognitive communication deficit  Rationale for Evaluation and Treatment: Rehabilitation  SUBJECTIVE:   SUBJECTIVE STATEMENT: "[name] his name? It starts with an H"  Pt accompanied by: self  PERTINENT HISTORY: 77 y.o. male presents to [facility] hospital on 7/14 after falling. Pt found to have small parafalcine SDH and right tentorial SDH , reporting nausea, HA, and dizziness. PMH:  includes anxiety, aflutter, depression, HTN, PE, CABG, DMII.   PAIN: Are you having pain? No  FALLS: Has patient fallen in last 6 months?  Yes, Number of falls: 3-4 (tripping)  PATIENT GOALS: "I want to be able to figure things out on my own. I want to confidently talk with people and not feel like I have to run and hide "   OBJECTIVE:   TODAY'S TREATMENT:                                                                                                                                          02-11-23: Attempted to retell recent story related to recalling medical information but unable to recall MD name. Did successfully request written information to aid processing and recall as recommended. Cued use of visual memory  support, which was effective. Utilized Office manager Therapy to target recall of MD name, in which pt able to recall targeted response up to 16 minute interval indicating increased ability to retain targeted information. Unable to recall relevant family information (grandchildren names); therefore, SLP prompted use of visual support (family tree) to aid recall. SLP prompted sentence completion was helpful to recall one granddaughter. Continued to recommend writing down relevant information as needed.   02-06-23: Wife attended session today. Provided education of clinical observations and targeted ST interventions related to ongoing cognitive and communication challenges. Conducted caregiver education re: recommended techniques to optimize recall/attention in conversation (writing notes, internal rehearsal) as well as word finding (cueing hierarchy) to reduce vague speech. Wife verbalized understanding and appreciation for recommendations. Today, targeted use of note writing to aid attention and recall in conversation. Pt able to completed with some intermittent mod A to focus on key words versus sentences.   02-04-23: Continues to come alone despite recommendation for wife to attend ST. No additional information added to "memory cheat sheet" other than PCP name from last session. Dicussed recent PCP appointment with reduced recall noted. Addressed mentioned concerns pertinent to ST, including memory loss (I.e., forgetting medications) and overall reduced awareness. Educated and instructed pertinent cognitive compensations and recommendations to aid completion of iADLs (recommending wife supervision and assistance with meds) and recall of functional information. Targeted writing down  information given auditory stimuli (voicemails), with frequent repetition required to aid accuracy of recall. Recertification completed for remaining visits with LTGs downgraded d/t persistent cognitive challenges despite SLP intervention, which may be indicative of underlying memory/cognitive impairment.   01-28-23: Errors noted on homework despite explicit written instructions to optimize descriptions for word retrieval. Pt continues to use one word or vague response to describe item despite repetitive instruction and practice. Modeling and re-instruction was ineffective so provided alternate strategy for synonyms for word retrieval. Occasional mod A required to ID appropriate synonyms on structured task. Endorsed difficulty recalling name of new PCP. SLP generated written aid "memory cheat sheet" to write down pertinent information that pt has difficulty consistently recalling. Reported good carryover of to-do lists and pre-planning with wife as previously instructed. Suggested wife participate in therapy sessions to aid carryover and provide caregiver education.    01-26-23: Reviewed homework to use description strategy for word retrieval, with noted difficulty understanding and implementing compensation. SLP re-educated rationale and structured techniques to optimize describing ability (SFA, written prompts, modeling) to aid word finding. Benefited from usual fading to occasional modeling and verbal cues to optimize specificity of descriptions. Usual extra processing time was beneficial for word retrieval and comprehension.   01-21-23: Endorsed difficulty alternating attention to transfer information between printed calendar and personal home calendar. Initial max A required to orient to today's date; recommended use of phone to aid orientation. Good carryover of phone exhibited throughout session when cued. Targeted transferring information from one written calendar to another with usual fading to occasional  max A required to complete task. Overt errors noted x3 which pt did not ID even with cued double check. Given ongoing overt difficulty managing calendar due to impaired attention, recall, and awareness, recommended increased assistance from wife as pt unable to carryover trained techniques independently. Reported some use of trained description strategy to aid anomia with less frustration reported. Reviewed descriptions written for homework with usual max cues required to increase specificity. Updated HEP to optimize descriptions for additional practice.  01-12-23: Returned after nearly 1 month d/t awaiting auth for  additional ST visits. In verbal recap of last month, pt reported he "flunked" driving test which resulted in revoking of driver's license. Also reported missing several hearing aid appointments due to life situations. Endorsed good carryover of calendar and memory supports to aid recall, with success indicated. Cued recall of recent ED event, in which pt endorsed possible psychosis. Some difficulty with thought organization and verbal expression noted today in conversation. Conversational speech c/b intermittent vague language and occasional anomia. Pt stated "there are some words I can't get out." Targeted word retrieval strategies and SFA this session to optimize communication effectiveness. Usual mod A required to aid comprehension and carryover.   12-19-22: Able to demo improved good problem solving, in which pt identified appropriate hearing clinic with appointment scheduled. Cued patient to write appointment on calendar. Min A required to ID correct date (19th vs 29th). Targeted problem solving to determine if this clinic accepts his insurance. Prompted using computer to investigate. Intermittent min cues required to maintain attention to targeted subject. Educated and instructed attention/processing/comprehension strategies to aid patient participation in conversation. Identified strategies x4  to implement at home with wife to repair communication breakdown. Due to time constraints, word finding strategies to be educated and trained next session.    12-17-22: Pt has demonstrated increased ability to manage medications more effectively with revision of updated personal medication list and double checking per SLP recommendations. Reported progress with memory but endorsed difficulty in conversation, particularly over the phone. Deduced difficulty stems from hearing impairment and auditory processing/comprehension. Targeted functional problem solving to address hearing impairment with use of visual aid. With usual prompting to deduce possible solutions, pt able to ID best solution (see audiologist). Pt able to sequence appropriate next steps with usual min A. SLP initiated education and instruction of attention/auditory comprehension strategies to aid attention, recall, and understanding in conversation. Pt would benefit from additional training to aid receptive and expressive language as pt noted with intermittent difficulty with word retrieval and answering SLP questions in conversation today.   PATIENT EDUCATION: Education details: see above Person educated: Patient Education method: Programmer, multimedia, Demonstration, Verbal cues, and Handouts Education comprehension: verbalized understanding, returned demonstration, and needs further education   GOALS: Goals reviewed with patient? Yes  SHORT TERM GOALS: Target date: 12/05/2022  Complete CLQT and PROM first 1-2 sessions with thorough discussion of results to heighten pt awareness  Baseline:  Goal status: MET  2.  Pt will follow multi-step instructions to complete functional task with ~90% accuracy given occasional min A for 2/2 opportunities  Baseline: 11-28-22 Goal status: PARTIALLY MET  3.  Pt will demonstrate increased attention to detail on structured iADL tasks with 3 or less errors exhibited given occasional min A  Baseline:  12/05/22 Goal status: PARTIALLY MET  4.  Pt will demonstrate increased safety awareness by verbalizing problem and solution prior to initiating task for 2/2 opportunities  Baseline: 11-19-22 Goal status: PARTIALLY MET  5.   Pt will utilize processing/attention strategies in short structured conversations to complete entirety of thought for 80% of messages given occasional min A  Baseline: 11-19-22, 11-21-22 Goal status: MET  LONG TERM GOALS: Target date: 03/04/23 (DOWNGRADED at [facility])  Pt will successfully manage iADLs (medications, finances, appointments) with use of memory aids and compensations given supervision from wife Baseline:  Goal status: IN PROGRESS   2.  Pt will demonstrate improved safety awareness and attention to detail with less frequent A from wife to ID safety concerns and complete functional tasks given  supervision from wife Baseline:  Goal status: IN PROGRESS   3.  Pt will utilize processing/attention strategies in extended unstructured conversations to complete entirety of thoughts for ~90% of messages given intermittent min A Baseline:  Goal status: IN PROGRESS   4.  Pt will demonstrate [facility] verbal expression with ability to compensate for anomia/dysnomia with trained strategy given occasional min A in 30+ minute conversations  Baseline:  Goal status: IN PROGRESS   ASSESSMENT:  CLINICAL IMPRESSION: Patient is a 77 y.o. male who was seen today for cognitive linguistic rehabilitation following SDH from fall in July 2023. Continues to present with ongoing difficulty with cognition (memory, attention, executive functioning) and communication (expressive and receptive language) impacting his ability to independently manage his affairs and converse effectively, despite repetitive instruction and training of recommended techniques. Initiated carryover education and training with wife to aid cognitive functioning and carryover at home. Given functional impact and increased  frustration for patient and wife, pt would benefit from skilled ST intervention to optimize cognitive functioning to maximize return to prior baseline.   OBJECTIVE IMPAIRMENTS: include attention, memory, awareness, and executive functioning. These impairments are limiting patient from managing medications, managing finances, and household responsibilities. Factors affecting potential to achieve goals and functional outcome are ability to learn/carryover information. Patient will benefit from skilled SLP services to address above impairments and improve overall function.  REHAB POTENTIAL: Good  PLAN:  SLP FREQUENCY: 2x/week  SLP DURATION: 12 weeks  PLANNED INTERVENTIONS: Language facilitation, Environmental controls, Cueing hierachy, Cognitive reorganization, Internal/external aids, Functional tasks, Multimodal communication approach, SLP instruction and feedback, Compensatory strategies, and Patient/family education    Gracy Racer, CCC-SLP 02/11/2023, 10:53 AM

## 2023-02-11 ENCOUNTER — Ambulatory Visit: Payer: Worker's Compensation

## 2023-02-11 DIAGNOSIS — R41841 Cognitive communication deficit: Secondary | ICD-10-CM

## 2023-02-11 NOTE — Patient Instructions (Addendum)
Evan [name] can help [place] fill out your family chart with an extra information you need   Ex: name of city where Evan [name] and Evan [name]   If you're getting stuck, stop and take a breath! Getting fixated or frustrated doesn't help your thinking   Slow down. Stop and think before you act  Use sticky notes around your house if you need reminders  Ex: Put in both hearing aids on your mirror

## 2023-02-13 ENCOUNTER — Ambulatory Visit: Payer: Worker's Compensation

## 2023-02-13 DIAGNOSIS — R41841 Cognitive communication deficit: Secondary | ICD-10-CM | POA: Diagnosis not present

## 2023-02-13 NOTE — Therapy (Signed)
OUTPATIENT SPEECH LANGUAGE PATHOLOGY TREATMENT   Patient Name: Evan [name] MRN: [id number] DOB:1946-03-07, 77 y.o., male Today's Date: 02/13/2023  PCP: Marden Noble MD REFERRING PROVIDER: Tia Alert MD  END OF SESSION:  End of Session - 02/13/23 1107     Visit Number 21    Number of Visits 25    Date for SLP Re-Evaluation 03/04/23    Authorization Type [employer]    Authorization Time Period 12 ST addl visits apprd - 12/23/21    Authorization - Visit Number 8    Authorization - Number of Visits 12    SLP Start Time 0932    SLP Stop Time  1019    SLP Time Calculation (min) 47 min    Activity Tolerance Patient tolerated treatment well                Past Medical History:  Diagnosis Date   Acquired dilation of ascending aorta and aortic root (HCC)    a.) measurements by TTE in 04/2021 --> root 40 mm and ascending aorta 43 mm   Anginal pain (HCC)    Anxiety    Atrial flutter (HCC) 04/22/2021   a.) CHADS2VASc = 5 (age x 2, HTN, T2DM, CAD); b.) daily warfarin   Carotid disease, bilateral (HCC)    a.) Doppler in 2019 --> 1-39% stenosis on RIGHT and 40-58% on LEFT   Cataracts, bilateral    Chronic anticoagulation    Warfarin   Chronic renal insufficiency    Coronary artery disease    a.) s/p 3v CABG in 1999   Depression    GERD (gastroesophageal reflux disease)    History of kidney stones    Hypercholesteremia    Hypertension    Kidney stone    Low testosterone    on TRT injections   Malignant melanoma (HCC)    a.) face, nose, left leg; b.) Tx'd with chemotherapy + XRT at [facility]   Myocardial infarction [facility])    Pulmonary emboli (HCC)    S/P CABG x 3 1999   a.) LIMA-LAD, SVG-RCA, SVG-diagnonal   Sleep apnea    not using nocturnal PAP therapy   T2DM (type 2 diabetes mellitus) (HCC)    Past Surgical History:  Procedure Laterality Date   CARDIAC CATHETERIZATION     x3     last 1999   CARDIOVERSION N/A 08/12/2021   Procedure:  CARDIOVERSION;  Surgeon: Pricilla Riffle, MD;  Location[facility] ENDOSCOPY;  Service: Cardiovascular;  Laterality: N/A;   CATARACT EXTRACTION Bilateral    COLONOSCOPY WITH PROPOFOL N/A 09/26/2014   Procedure: COLONOSCOPY WITH PROPOFOL;  Surgeon: Charolett Bumpers, MD;  Location: [facility] ENDOSCOPY;  Service: Endoscopy;  Laterality: N/A;   CORONARY ARTERY BYPASS GRAFT N/A 1999   3v; LIMA-LAD, SVG-RCA, SVG-diagonal   HOLEP-LASER ENUCLEATION OF THE PROSTATE WITH MORCELLATION N/A 06/07/2021   Procedure: HOLEP-LASER ENUCLEATION OF THE PROSTATE WITH MORCELLATION;  Surgeon: Sondra Come, MD;  Location: [facility] ORS;  Service: Urology;  Laterality: N/A;   KNEE ARTHROSCOPY Left    LAPAROSCOPIC CHOLECYSTECTOMY     MASS EXCISION  04/15/2012   Procedure: EXCISION MASS;  Surgeon: Suzanna Obey, MD;  Location: [facility] OR;  Service: ENT;  Laterality: Left;  Left tonsil biopsy   melanomia     several skin ca removed-nose, left ankle, parotid gland left, right nodes-being followed annually..   Patient Active Problem List   Diagnosis Date Noted   SDH (subdural hematoma) (HCC) 04/11/2022  Fall (on)(from) sidewalk curb, initial encounter 04/11/2022   History of melanoma excision 04/11/2022   Diabetes mellitus type 2 in obese 04/11/2022   DNR (do not resuscitate) 04/11/2022   Chronic anticoagulation 06/30/2021   Typical atrial flutter (HCC) 06/28/2021   Pure hypercholesterolemia 12/23/2013   Carotid artery disease (HCC) 12/23/2013   Essential hypertension, benign 12/23/2013   Angina decubitus 12/23/2013   Coronary artery disease    Cancer (HCC)    Depression    Pulmonary emboli (HCC)    Hypercholesteremia    Kidney stone    Chronic renal insufficiency     ONSET DATE: 04/11/2022 (referral=11/06/22)  REFERRING DIAG: S06.5XAA- Traumatic subdural hemorrhage with loss of consciousness   THERAPY DIAG: Cognitive communication deficit  Rationale for Evaluation and Treatment: Rehabilitation  SUBJECTIVE:   SUBJECTIVE  STATEMENT: "I remembered - Dr. Orson Aloe" Pt accompanied by: self  PERTINENT HISTORY: 77 y.o. male presents to [facility] hospital on 7/14 after falling. Pt found to have small parafalcine SDH and right tentorial SDH , reporting nausea, HA, and dizziness. PMH:  includes anxiety, aflutter, depression, HTN, PE, CABG, DMII.   PAIN: Are you having pain? No  FALLS: Has patient fallen in last 6 months?  Yes, Number of falls: 3-4 (tripping)  PATIENT GOALS: "I want to be able to figure things out on my own. I want to confidently talk with people and not feel like I have to run and hide "   OBJECTIVE:   TODAY'S TREATMENT:                                                                                                                                         02-13-23: Evan [name] attended session today. Pt accurately recalled MD name from last session, indicating success of Spaced Retrieval therapy technique. Again, re-iterated ability to use this intervention with impaired recall of other functional information. Pt able to appropriately locate memory "cheat sheet" but no new additions included. Evan [name] endorsed now assisting with medications d/t MD questioning medication management at last appointment. Re-educated previous SLP recommendation for wife assisting with medicines d/t pt exhibiting reduced error awareness and attention/memory deficits. Suggested scaffolding assistance during functional tasks at home, including supervision first and increasing wife assistance as needed. Current medication and financial systems to be re-assessed this weekend with wife supervision, given pt has been managing independently with visual aids. Plan to target some online bill pay and financial management scenarios next session.   02-11-23: Attempted to retell recent story related to recalling medical information but unable to recall MD name. Did successfully request written information to aid processing and recall as recommended. Cued use of  visual memory support, which was effective. Utilized Office manager Therapy to target recall of MD name, in which pt able to recall targeted response up to 16 minute interval indicating increased ability to retain targeted information. Unable to recall relevant family information (grandchildren names);  therefore, SLP prompted use of visual support (family tree) to aid recall. SLP prompted sentence completion was helpful to recall one granddaughter. Continued to recommend writing down relevant information as needed.   02-06-23: Wife attended session today. Provided education of clinical observations and targeted ST interventions related to ongoing cognitive and communication challenges. Conducted caregiver education re: recommended techniques to optimize recall/attention in conversation (writing notes, internal rehearsal) as well as word finding (cueing hierarchy) to reduce vague speech. Wife verbalized understanding and appreciation for recommendations. Today, targeted use of note writing to aid attention and recall in conversation. Pt able to completed with some intermittent mod A to focus on key words versus sentences.   02-04-23: Continues to come alone despite recommendation for wife to attend ST. No additional information added to "memory cheat sheet" other than PCP name from last session. Dicussed recent PCP appointment with reduced recall noted. Addressed mentioned concerns pertinent to ST, including memory loss (I.e., forgetting medications) and overall reduced awareness. Educated and instructed pertinent cognitive compensations and recommendations to aid completion of iADLs (recommending wife supervision and assistance with meds) and recall of functional information. Targeted writing down information given auditory stimuli (voicemails), with frequent repetition required to aid accuracy of recall. Recertification completed for remaining visits with LTGs downgraded d/t persistent cognitive challenges  despite SLP intervention, which may be indicative of underlying memory/cognitive impairment.   01-28-23: Errors noted on homework despite explicit written instructions to optimize descriptions for word retrieval. Pt continues to use one word or vague response to describe item despite repetitive instruction and practice. Modeling and re-instruction was ineffective so provided alternate strategy for synonyms for word retrieval. Occasional mod A required to ID appropriate synonyms on structured task. Endorsed difficulty recalling name of new PCP. SLP generated written aid "memory cheat sheet" to write down pertinent information that pt has difficulty consistently recalling. Reported good carryover of to-do lists and pre-planning with wife as previously instructed. Suggested wife participate in therapy sessions to aid carryover and provide caregiver education.    01-26-23: Reviewed homework to use description strategy for word retrieval, with noted difficulty understanding and implementing compensation. SLP re-educated rationale and structured techniques to optimize describing ability (SFA, written prompts, modeling) to aid word finding. Benefited from usual fading to occasional modeling and verbal cues to optimize specificity of descriptions. Usual extra processing time was beneficial for word retrieval and comprehension.   01-21-23: Endorsed difficulty alternating attention to transfer information between printed calendar and personal home calendar. Initial max A required to orient to today's date; recommended use of phone to aid orientation. Good carryover of phone exhibited throughout session when cued. Targeted transferring information from one written calendar to another with usual fading to occasional max A required to complete task. Overt errors noted x3 which pt did not ID even with cued double check. Given ongoing overt difficulty managing calendar due to impaired attention, recall, and awareness,  recommended increased assistance from wife as pt unable to carryover trained techniques independently. Reported some use of trained description strategy to aid anomia with less frustration reported. Reviewed descriptions written for homework with usual max cues required to increase specificity. Updated HEP to optimize descriptions for additional practice.  01-12-23: Returned after nearly 1 month d/t awaiting auth for additional ST visits. In verbal recap of last month, pt reported he "flunked" driving test which resulted in revoking of driver's license. Also reported missing several hearing aid appointments due to life situations. Endorsed good carryover of calendar and memory supports to  aid recall, with success indicated. Cued recall of recent ED event, in which pt endorsed possible psychosis. Some difficulty with thought organization and verbal expression noted today in conversation. Conversational speech c/b intermittent vague language and occasional anomia. Pt stated "there are some words I can't get out." Targeted word retrieval strategies and SFA this session to optimize communication effectiveness. Usual mod A required to aid comprehension and carryover.   12-19-22: Able to demo improved good problem solving, in which pt identified appropriate hearing clinic with appointment scheduled. Cued patient to write appointment on calendar. Min A required to ID correct date (19th vs 29th). Targeted problem solving to determine if this clinic accepts his insurance. Prompted using computer to investigate. Intermittent min cues required to maintain attention to targeted subject. Educated and instructed attention/processing/comprehension strategies to aid patient participation in conversation. Identified strategies x4 to implement at home with wife to repair communication breakdown. Due to time constraints, word finding strategies to be educated and trained next session.    12-17-22: Pt has demonstrated increased  ability to manage medications more effectively with revision of updated personal medication list and double checking per SLP recommendations. Reported progress with memory but endorsed difficulty in conversation, particularly over the phone. Deduced difficulty stems from hearing impairment and auditory processing/comprehension. Targeted functional problem solving to address hearing impairment with use of visual aid. With usual prompting to deduce possible solutions, pt able to ID best solution (see audiologist). Pt able to sequence appropriate next steps with usual min A. SLP initiated education and instruction of attention/auditory comprehension strategies to aid attention, recall, and understanding in conversation. Pt would benefit from additional training to aid receptive and expressive language as pt noted with intermittent difficulty with word retrieval and answering SLP questions in conversation today.   PATIENT EDUCATION: Education details: see above Person educated: Patient Education method: Programmer, multimedia, Demonstration, Verbal cues, and Handouts Education comprehension: verbalized understanding, returned demonstration, and needs further education   GOALS: Goals reviewed with patient? Yes  SHORT TERM GOALS: Target date: 12/05/2022  Complete CLQT and PROM first 1-2 sessions with thorough discussion of results to heighten pt awareness  Baseline:  Goal status: MET  2.  Pt will follow multi-step instructions to complete functional task with ~90% accuracy given occasional min A for 2/2 opportunities  Baseline: 11-28-22 Goal status: PARTIALLY MET  3.  Pt will demonstrate increased attention to detail on structured iADL tasks with 3 or less errors exhibited given occasional min A  Baseline: 12/05/22 Goal status: PARTIALLY MET  4.  Pt will demonstrate increased safety awareness by verbalizing problem and solution prior to initiating task for 2/2 opportunities  Baseline: 11-19-22 Goal status:  PARTIALLY MET  5.   Pt will utilize processing/attention strategies in short structured conversations to complete entirety of thought for 80% of messages given occasional min A  Baseline: 11-19-22, 11-21-22 Goal status: MET  LONG TERM GOALS: Target date: 03/04/23 (DOWNGRADED at [facility])  Pt will successfully manage iADLs (medications, finances, appointments) with use of memory aids and compensations given supervision from wife Baseline:  Goal status: IN PROGRESS   2.  Pt will demonstrate improved safety awareness and attention to detail with less frequent A from wife to ID safety concerns and complete functional tasks given supervision from wife Baseline:  Goal status: IN PROGRESS   3.  Pt will utilize processing/attention strategies in extended unstructured conversations to complete entirety of thoughts for ~90% of messages given intermittent min A Baseline:  Goal status: IN PROGRESS  4.  Pt will demonstrate [facility] verbal expression with ability to compensate for anomia/dysnomia with trained strategy given occasional min A in 30+ minute conversations  Baseline:  Goal status: IN PROGRESS   ASSESSMENT:  CLINICAL IMPRESSION: Patient is a 77 y.o. male who was seen today for cognitive linguistic rehabilitation following SDH from fall in July 2023. Continues to present with ongoing difficulty with cognition (memory, attention, executive functioning) and communication (expressive and receptive language) impacting his ability to independently manage his affairs and converse effectively, despite repetitive instruction and training of recommended techniques. Conducting ongoing  education and training with wife to aid cognitive functioning and carryover at home. Given functional impact and increased frustration for patient and wife, pt would benefit from skilled ST intervention to optimize cognitive functioning to maximize return to prior baseline.   OBJECTIVE IMPAIRMENTS: include attention, memory,  awareness, and executive functioning. These impairments are limiting patient from managing medications, managing finances, and household responsibilities. Factors affecting potential to achieve goals and functional outcome are ability to learn/carryover information. Patient will benefit from skilled SLP services to address above impairments and improve overall function.  REHAB POTENTIAL: Good  PLAN:  SLP FREQUENCY: 2x/week  SLP DURATION: 12 weeks  PLANNED INTERVENTIONS: Language facilitation, Environmental controls, Cueing hierachy, Cognitive reorganization, Internal/external aids, Functional tasks, Multimodal communication approach, SLP instruction and feedback, Compensatory strategies, and Patient/family education    Gracy Racer, CCC-SLP 02/13/2023, 11:10 AM

## 2023-02-14 ENCOUNTER — Observation Stay (HOSPITAL_COMMUNITY)
Admission: EM | Admit: 2023-02-14 | Discharge: 2023-02-16 | Disposition: A | Discharge status: Home / Self Care | Payer: 59 | Attending: Internal Medicine | Admitting: Internal Medicine

## 2023-02-14 ENCOUNTER — Encounter (HOSPITAL_COMMUNITY): Payer: Self-pay | Admitting: Internal Medicine

## 2023-02-14 ENCOUNTER — Other Ambulatory Visit: Payer: Self-pay

## 2023-02-14 DIAGNOSIS — Z7901 Long term (current) use of anticoagulants: Secondary | ICD-10-CM | POA: Diagnosis not present

## 2023-02-14 DIAGNOSIS — Z79899 Other long term (current) drug therapy: Secondary | ICD-10-CM | POA: Diagnosis not present

## 2023-02-14 DIAGNOSIS — Z7984 Long term (current) use of oral hypoglycemic drugs: Secondary | ICD-10-CM | POA: Insufficient documentation

## 2023-02-14 DIAGNOSIS — Z85828 Personal history of other malignant neoplasm of skin: Secondary | ICD-10-CM | POA: Insufficient documentation

## 2023-02-14 DIAGNOSIS — Z86711 Personal history of pulmonary embolism: Secondary | ICD-10-CM | POA: Diagnosis not present

## 2023-02-14 DIAGNOSIS — Z951 Presence of aortocoronary bypass graft: Secondary | ICD-10-CM | POA: Diagnosis not present

## 2023-02-14 DIAGNOSIS — E11649 Type 2 diabetes mellitus with hypoglycemia without coma: Secondary | ICD-10-CM | POA: Diagnosis not present

## 2023-02-14 DIAGNOSIS — E161 Other hypoglycemia: Secondary | ICD-10-CM | POA: Diagnosis not present

## 2023-02-14 DIAGNOSIS — I129 Hypertensive chronic kidney disease with stage 1 through stage 4 chronic kidney disease, or unspecified chronic kidney disease: Secondary | ICD-10-CM | POA: Diagnosis not present

## 2023-02-14 DIAGNOSIS — E162 Hypoglycemia, unspecified: Secondary | ICD-10-CM | POA: Diagnosis present

## 2023-02-14 DIAGNOSIS — N1832 Chronic kidney disease, stage 3b: Secondary | ICD-10-CM | POA: Diagnosis not present

## 2023-02-14 DIAGNOSIS — R000 Tachycardia, unspecified: Secondary | ICD-10-CM | POA: Diagnosis not present

## 2023-02-14 DIAGNOSIS — E1122 Type 2 diabetes mellitus with diabetic chronic kidney disease: Secondary | ICD-10-CM | POA: Diagnosis not present

## 2023-02-14 DIAGNOSIS — R29898 Other symptoms and signs involving the musculoskeletal system: Secondary | ICD-10-CM | POA: Diagnosis not present

## 2023-02-14 DIAGNOSIS — R7983 Abnormal findings of blood amino-acid level: Secondary | ICD-10-CM | POA: Insufficient documentation

## 2023-02-14 DIAGNOSIS — I2510 Atherosclerotic heart disease of native coronary artery without angina pectoris: Secondary | ICD-10-CM | POA: Insufficient documentation

## 2023-02-14 DIAGNOSIS — I10 Essential (primary) hypertension: Secondary | ICD-10-CM | POA: Diagnosis not present

## 2023-02-14 DIAGNOSIS — E872 Acidosis, unspecified: Secondary | ICD-10-CM | POA: Insufficient documentation

## 2023-02-14 LAB — CBC WITH DIFFERENTIAL/PLATELET
Abs Immature Granulocytes: 0.01 10*3/uL (ref 0.00–0.07)
Basophils Absolute: 0 10*3/uL (ref 0.0–0.1)
Basophils Relative: 0 %
Eosinophils Absolute: 0.1 10*3/uL (ref 0.0–0.5)
Eosinophils Relative: 1 %
HCT: 53.3 % — ABNORMAL HIGH (ref 39.0–52.0)
Hemoglobin: 16.9 g/dL (ref 13.0–17.0)
Immature Granulocytes: 0 %
Lymphocytes Relative: 19 %
Lymphs Abs: 1.3 10*3/uL (ref 0.7–4.0)
MCH: 29.6 pg (ref 26.0–34.0)
MCHC: 31.7 g/dL (ref 30.0–36.0)
MCV: 93.3 fL (ref 80.0–100.0)
Monocytes Absolute: 0.5 10*3/uL (ref 0.1–1.0)
Monocytes Relative: 7 %
Neutro Abs: 5.1 10*3/uL (ref 1.7–7.7)
Neutrophils Relative %: 73 %
Platelets: 187 10*3/uL (ref 150–400)
RBC: 5.71 MIL/uL (ref 4.22–5.81)
RDW: 14.1 % (ref 11.5–15.5)
WBC: 7 10*3/uL (ref 4.0–10.5)
nRBC: 0 % (ref 0.0–0.2)

## 2023-02-14 LAB — URINALYSIS, ROUTINE W REFLEX MICROSCOPIC
Bilirubin Urine: NEGATIVE
Glucose, UA: 50 mg/dL — AB
Ketones, ur: NEGATIVE mg/dL
Leukocytes,Ua: NEGATIVE
Nitrite: NEGATIVE
Protein, ur: NEGATIVE mg/dL
Specific Gravity, Urine: 1.016 (ref 1.005–1.030)
pH: 5 (ref 5.0–8.0)

## 2023-02-14 LAB — PROTIME-INR
INR: 6.6 (ref 0.8–1.2)
INR: 6.7 (ref 0.8–1.2)
Prothrombin Time: 57.6 seconds — ABNORMAL HIGH (ref 11.4–15.2)
Prothrombin Time: 58.5 seconds — ABNORMAL HIGH (ref 11.4–15.2)

## 2023-02-14 LAB — GLUCOSE, CAPILLARY
Glucose-Capillary: 111 mg/dL — ABNORMAL HIGH (ref 70–99)
Glucose-Capillary: 165 mg/dL — ABNORMAL HIGH (ref 70–99)
Glucose-Capillary: 225 mg/dL — ABNORMAL HIGH (ref 70–99)
Glucose-Capillary: 148 mg/dL — ABNORMAL HIGH (ref 70–99)
Glucose-Capillary: 134 mg/dL — ABNORMAL HIGH (ref 70–99)

## 2023-02-14 LAB — COMPREHENSIVE METABOLIC PANEL
ALT: 24 U/L (ref 0–44)
AST: 26 U/L (ref 15–41)
Albumin: 3.5 g/dL (ref 3.5–5.0)
Alkaline Phosphatase: 47 U/L (ref 38–126)
Anion gap: 10 (ref 5–15)
BUN: 36 mg/dL — ABNORMAL HIGH (ref 8–23)
CO2: 20 mmol/L — ABNORMAL LOW (ref 22–32)
Calcium: 9.6 mg/dL (ref 8.9–10.3)
Chloride: 105 mmol/L (ref 98–111)
Creatinine, Ser: 1.85 mg/dL — ABNORMAL HIGH (ref 0.61–1.24)
GFR, Estimated: 37 mL/min — ABNORMAL LOW (ref >60)
Glucose, Bld: 141 mg/dL — ABNORMAL HIGH (ref 70–99)
Potassium: 4.6 mmol/L (ref 3.5–5.1)
Sodium: 135 mmol/L (ref 135–145)
Total Bilirubin: 0.3 mg/dL (ref 0.3–1.2)
Total Protein: 6.8 g/dL (ref 6.5–8.1)

## 2023-02-14 LAB — CBC
HCT: 51.1 % (ref 39.0–52.0)
Hemoglobin: 16.4 g/dL (ref 13.0–17.0)
MCH: 30 pg (ref 26.0–34.0)
MCHC: 32.1 g/dL (ref 30.0–36.0)
MCV: 93.4 fL (ref 80.0–100.0)
Platelets: 176 10*3/uL (ref 150–400)
RBC: 5.47 MIL/uL (ref 4.22–5.81)
RDW: 14 % (ref 11.5–15.5)
WBC: 8 10*3/uL (ref 4.0–10.5)
nRBC: 0 % (ref 0.0–0.2)

## 2023-02-14 LAB — BASIC METABOLIC PANEL
Anion gap: 7 (ref 5–15)
BUN: 35 mg/dL — ABNORMAL HIGH (ref 8–23)
CO2: 22 mmol/L (ref 22–32)
Calcium: 9.4 mg/dL (ref 8.9–10.3)
Chloride: 104 mmol/L (ref 98–111)
Creatinine, Ser: 1.89 mg/dL — ABNORMAL HIGH (ref 0.61–1.24)
GFR, Estimated: 36 mL/min — ABNORMAL LOW (ref >60)
Glucose, Bld: 63 mg/dL — ABNORMAL LOW (ref 70–99)
Potassium: 5.1 mmol/L (ref 3.5–5.1)
Sodium: 133 mmol/L — ABNORMAL LOW (ref 135–145)

## 2023-02-14 LAB — HEMOGLOBIN A1C
Hgb A1c MFr Bld: 5.9 % — ABNORMAL HIGH (ref 4.8–5.6)
Mean Plasma Glucose: 122.63 mg/dL

## 2023-02-14 LAB — LACTIC ACID, PLASMA: Lactic Acid, Venous: 1.5 mmol/L (ref 0.5–1.9)

## 2023-02-14 LAB — CBG MONITORING, ED
Glucose-Capillary: 61 mg/dL — ABNORMAL LOW (ref 70–99)
Glucose-Capillary: 76 mg/dL (ref 70–99)
Glucose-Capillary: 56 mg/dL — ABNORMAL LOW (ref 70–99)
Glucose-Capillary: 88 mg/dL (ref 70–99)
Glucose-Capillary: 84 mg/dL (ref 70–99)

## 2023-02-14 LAB — TROPONIN I (HIGH SENSITIVITY): Troponin I (High Sensitivity): 10 ng/L (ref <18)

## 2023-02-14 MED ORDER — POLYETHYLENE GLYCOL 3350 17 G PO PACK: 17.0000 g | PACK | Freq: Every day | ORAL | Status: DC | PRN

## 2023-02-14 MED ORDER — PROCHLORPERAZINE EDISYLATE 10 MG/2ML IJ SOLN: 5.0000 mg | Freq: Four times a day (QID) | INTRAMUSCULAR | Status: DC | PRN

## 2023-02-14 MED ORDER — ROSUVASTATIN CALCIUM 20 MG PO TABS
20.0000 mg | ORAL_TABLET | Freq: Every evening | ORAL | Status: DC
  Administered 2023-02-14 – 2023-02-15 (×2): 20 mg via ORAL
  Filled 2023-02-14 (×2): qty 1

## 2023-02-14 MED ORDER — PANTOPRAZOLE SODIUM 40 MG PO TBEC
40.0000 mg | DELAYED_RELEASE_TABLET | Freq: Every day | ORAL | Status: DC
  Administered 2023-02-14 – 2023-02-16 (×3): 40 mg via ORAL
  Filled 2023-02-14 (×3): qty 1

## 2023-02-14 MED ORDER — DEXTROSE 10 % IV SOLN: INTRAVENOUS | Status: DC

## 2023-02-14 MED ORDER — WARFARIN - PHARMACIST DOSING INPATIENT: Freq: Every day | Status: DC

## 2023-02-14 MED ORDER — DEXTROSE 50 % IV SOLN
25.0000 mL | Freq: Once | INTRAVENOUS | Status: AC
  Administered 2023-02-14: 25 mL via INTRAVENOUS

## 2023-02-14 MED ORDER — ACETAMINOPHEN 325 MG PO TABS: 650.0000 mg | ORAL_TABLET | Freq: Four times a day (QID) | ORAL | Status: DC | PRN

## 2023-02-14 MED ORDER — DEXTROSE 50 % IV SOLN
25.0000 mL | Freq: Once | INTRAVENOUS | Status: AC
  Administered 2023-02-14: 25 mL via INTRAVENOUS
  Filled 2023-02-14: qty 50

## 2023-02-14 MED ORDER — CITALOPRAM HYDROBROMIDE 40 MG PO TABS
40.0000 mg | ORAL_TABLET | Freq: Every morning | ORAL | Status: DC
  Administered 2023-02-14 – 2023-02-16 (×3): 40 mg via ORAL
  Filled 2023-02-14 (×3): qty 1

## 2023-02-14 MED ORDER — DEXTROSE 5 % IV SOLN: INTRAVENOUS | Status: DC

## 2023-02-14 MED ORDER — OMEGA-3-ACID ETHYL ESTERS 1 G PO CAPS
2.0000 | ORAL_CAPSULE | Freq: Two times a day (BID) | ORAL | Status: DC
  Administered 2023-02-14 – 2023-02-16 (×5): 2 g via ORAL
  Filled 2023-02-14 (×7): qty 2

## 2023-02-14 NOTE — Progress Notes (Signed)
Patient was admitted to unit with wife at bedside. Patient has medications in bag to be sent home with spouse. Educated on the importance of adhering to medication regimen at the hospital VS home med administration. Patient educated on the call,don't fall protocol. Patient denies having any questions and is oriented the unit.

## 2023-02-14 NOTE — Progress Notes (Signed)
ANTICOAGULATION CONSULT NOTE - Initial Consult  Pharmacy Consult for warfarin Indication: hx pulmonary embolus  Allergies  Allergen Reactions   Doxycycline Other (See Comments)    BPH can't void when taking    Penicillins Rash and Itching    Patient Measurements: Height: 5\' 9"  (175.3 cm) Weight: 95.3 kg (210 lb) IBW/kg (Calculated) : 70.7  Vital Signs: Temp: 97.5 F (36.4 C) (05/18 0653) Temp Source: Oral (05/18 0653) BP: 168/80 (05/18 0653) Pulse Rate: 65 (05/18 0653)  Labs: Recent Labs    02/14/23 0117 02/14/23 0448  HGB 16.9 16.4  HCT 53.3* 51.1  PLT 187 176  LABPROT 57.6* 58.5*  INR 6.6* 6.7*  CREATININE 1.85* 1.89*  TROPONINIHS 10  --     Estimated Creatinine Clearance: 37.3 mL/min (A) (by C-G formula based on SCr of 1.89 mg/dL (H)).   Medical History: Past Medical History:  Diagnosis Date   Acquired dilation of ascending aorta and aortic root (HCC)    a.) measurements by TTE in 04/2021 --> root 40 mm and ascending aorta 43 mm   Anginal pain (HCC)    Anxiety    Atrial flutter (HCC) 04/22/2021   a.) CHADS2VASc = 5 (age x 2, HTN, T2DM, CAD); b.) daily warfarin   Carotid disease, bilateral (HCC)    a.) Doppler in 2019 --> 1-39% stenosis on RIGHT and 40-58% on LEFT   Cataracts, bilateral    Chronic anticoagulation    Warfarin   Chronic renal insufficiency    Coronary artery disease    a.) s/p 3v CABG in 1999   Depression    GERD (gastroesophageal reflux disease)    History of kidney stones    Hypercholesteremia    Hypertension    Kidney stone    Low testosterone    on TRT injections   Malignant melanoma (HCC)    a.) face, nose, left leg; b.) Tx'd with chemotherapy + XRT at [facility]   Myocardial infarction [facility])    Pulmonary emboli (HCC)    S/P CABG x 3 1999   a.) LIMA-LAD, SVG-RCA, SVG-diagnonal   Sleep apnea    not using nocturnal PAP therapy   T2DM (type 2 diabetes mellitus) (HCC)     Medications:  Medications Prior to Admission   Medication Sig Dispense Refill Last Dose   acetaminophen (TYLENOL) 325 MG tablet Take 2 tablets (650 mg total) by mouth every 6 (six) hours as needed for mild pain (or Fever >/= 101). 20 tablet 0    ANUCORT-HC 25 MG suppository Place 25 mg rectally daily as needed for hemorrhoids or anal itching.      citalopram (CELEXA) 40 MG tablet Take 40 mg by mouth every morning.      fenofibrate micronized (LOFIBRA) 67 MG capsule TAKE 1 CAPSULE DAILY (CALL OFFICE TO SCHEDULE YEARLY APPOINTMENT WITH DR. Anne Fu FOR NOVEMBER 2023 BEFORE ANY MORE REFILLS [phone]) 30 capsule 0    fluticasone (CUTIVATE) 0.05 % cream Apply 1 Application topically 2 (two) times daily as needed (rash).      glyBURIDE (DIABETA) 5 MG tablet Take 5 mg by mouth every evening.      metFORMIN (GLUCOPHAGE-XR) 500 MG 24 hr tablet Take 2 tablets (1,000 mg total) by mouth 2 (two) times daily. 90 tablet 2    metoprolol succinate (TOPROL-XL) 50 MG 24 hr tablet TAKE 1 TABLET DAILY WITH OR IMMEDIATELY FOLLOWING A MEAL 30 tablet 0    nitroGLYCERIN (NITROSTAT) 0.4 MG SL tablet Place 0.4 mg under the tongue every 5 (  five) minutes as needed for chest pain.      omega-3 acid ethyl esters (LOVAZA) 1 g capsule Take 2 capsules (2 g total) by mouth 2 (two) times daily. 360 capsule 1    ondansetron (ZOFRAN) 4 MG tablet Take 1 tablet (4 mg total) by mouth every 6 (six) hours as needed for nausea. 20 tablet 0    pantoprazole (PROTONIX) 40 MG tablet Take 40 mg by mouth daily.      pioglitazone (ACTOS) 30 MG tablet Take 30 mg by mouth every morning.      polyethylene glycol (MIRALAX / GLYCOLAX) 17 g packet Take 17 g by mouth daily as needed for mild constipation. 14 each 0    rosuvastatin (CRESTOR) 20 MG tablet Take 20 mg by mouth every evening.      valsartan (DIOVAN) 160 MG tablet Take 80 mg by mouth every morning.      warfarin (COUMADIN) 5 MG tablet Take 0.5-1 tablets (2.5-5 mg total) by mouth See admin instructions. As directed--1/2 tab ( 2.5 mg)   every Monday,Tuesday, and Wednesday; 1 tab  ( 5 mg)  all other days       Assessment: 77 yom on warfarin PTA for hx PE admitted with refractory hypoglycemia. INR on admission was supratherapeutic at 6.6.  INR remains supratherapeutic this morning at 6.7. No bleeding noted, CBC is normal/stable with Hgb of 16.4 and platelets of 176   PTA dose from fill history is 7.5 mg on Sat/Sun and 5 mg all other days > should be confirmed with actual med history though   Goal of Therapy:  INR 2-3 Monitor platelets by anticoagulation protocol: Yes   Plan:  Continue to hold warfarin  Daily INR Monitor for s/sx of bleeding F/u PTA warfarin dosing with a medication history   Thank you for involving pharmacy in this patient's care.  Fara Olden, PharmD, BCPS, BCOP Clinical Pharmacist 02/14/2023 7:36 AM

## 2023-02-14 NOTE — ED Provider Notes (Signed)
[place] EMERGENCY DEPARTMENT AT [facility] Provider Note   CSN: [id number] Arrival date & time: 02/14/23  0043     History  Chief Complaint  Patient presents with   Hypoglycemia    Patient to ED via EMS with complaint of hypoglycemia blood sugar was 46 last night around 2345. Patient was given PO glucose and blood sugar came up to 58. EMS gave patient IV 5-10 250 mL and patient's blood sugar was 118 at 0130 this am.    Evan [name] is a 77 y.o. male.  Patient brought to the emergency department for evaluation of low blood sugar.  EMS reports that they have had multiple visits over the last 24 hours to the house.  Patient reports that his medications have not changed.  He did not eat lunch today but otherwise has been eating normally.  He has not felt unwell in general, no recent illness.  Denies any pain.       Home Medications Prior to Admission medications   Medication Sig Start Date End Date Taking? Authorizing Provider  acetaminophen (TYLENOL) 325 MG tablet Take 2 tablets (650 mg total) by mouth every 6 (six) hours as needed for mild pain (or Fever >/= 101). 04/12/22   Sheikh, Omair Latif, DO  ANUCORT-HC 25 MG suppository Place 25 mg rectally daily as needed for hemorrhoids or anal itching. 12/19/20   [provider]  citalopram (CELEXA) 40 MG tablet Take 40 mg by mouth every morning.    [provider]  fenofibrate micronized (LOFIBRA) 67 MG capsule TAKE 1 CAPSULE DAILY (CALL OFFICE TO SCHEDULE YEARLY APPOINTMENT WITH DR. Anne Fu FOR NOVEMBER 2023 BEFORE ANY MORE REFILLS [phone]) 11/24/22   Jake Bathe, MD  fluticasone (CUTIVATE) 0.05 % cream Apply 1 Application topically 2 (two) times daily as needed (rash). 10/22/20   [provider]  glyBURIDE (DIABETA) 5 MG tablet Take 5 mg by mouth every evening.    [provider]  metFORMIN (GLUCOPHAGE-XR) 500 MG 24 hr tablet Take 2 tablets (1,000 mg total) by mouth 2 (two) times  daily. 09/26/22   Jake Bathe, MD  metoprolol succinate (TOPROL-XL) 50 MG 24 hr tablet TAKE 1 TABLET DAILY WITH OR IMMEDIATELY FOLLOWING A MEAL 08/29/22   Jake Bathe, MD  nitroGLYCERIN (NITROSTAT) 0.4 MG SL tablet Place 0.4 mg under the tongue every 5 (five) minutes as needed for chest pain. 12/14/20   [provider]  omega-3 acid ethyl esters (LOVAZA) 1 g capsule Take 2 capsules (2 g total) by mouth 2 (two) times daily. 05/28/22   Jake Bathe, MD  ondansetron (ZOFRAN) 4 MG tablet Take 1 tablet (4 mg total) by mouth every 6 (six) hours as needed for nausea. 04/12/22   Marguerita Merles Latif, DO  pantoprazole (PROTONIX) 40 MG tablet Take 40 mg by mouth daily.    [provider]  pioglitazone (ACTOS) 30 MG tablet Take 30 mg by mouth every morning.    [provider]  polyethylene glycol (MIRALAX / GLYCOLAX) 17 g packet Take 17 g by mouth daily as needed for mild constipation. 04/12/22   Marguerita Merles Latif, DO  rosuvastatin (CRESTOR) 20 MG tablet Take 20 mg by mouth every evening.    [provider]  valsartan (DIOVAN) 160 MG tablet Take 80 mg by mouth every morning.    [provider]  warfarin (COUMADIN) 5 MG tablet Take 0.5-1 tablets (2.5-5 mg total) by mouth See admin instructions. As directed--1/2 tab (  2.5 mg)  every Monday,Tuesday, and Wednesday; 1 tab  ( 5 mg)  all other days 04/15/22   Marguerita Merles Latif, DO      Allergies    Doxycycline and Penicillins    Review of Systems   Review of Systems  Physical Exam Updated Vital Signs BP 136/63   Pulse 82   Temp 97.7 F (36.5 C) (Oral)   Resp 19   Ht 5\' 9"  (1.753 m)   Wt 95.3 kg   SpO2 100%   BMI 31.01 kg/m  Physical Exam Vitals and nursing note reviewed.  Constitutional:      General: He is not in acute distress.    Appearance: He is well-developed.  HENT:     Head: Normocephalic and atraumatic.     Mouth/Throat:     Mouth: Mucous membranes are moist.  Eyes:     General: Vision  grossly intact. Gaze aligned appropriately.     Extraocular Movements: Extraocular movements intact.     Conjunctiva/sclera: Conjunctivae normal.  Cardiovascular:     Rate and Rhythm: Normal rate and regular rhythm.     Pulses: Normal pulses.     Heart sounds: Normal heart sounds, S1 normal and S2 normal. No murmur heard.    No friction rub. No gallop.  Pulmonary:     Effort: Pulmonary effort is normal. No respiratory distress.     Breath sounds: Normal breath sounds.  Abdominal:     Palpations: Abdomen is soft.     Tenderness: There is no abdominal tenderness. There is no guarding or rebound.     Hernia: No hernia is present.  Musculoskeletal:        General: No swelling.     Cervical back: Full passive range of motion without pain, normal range of motion and neck supple. No pain with movement, spinous process tenderness or muscular tenderness. Normal range of motion.     Right lower leg: No edema.     Left lower leg: No edema.  Skin:    General: Skin is warm and dry.     Capillary Refill: Capillary refill takes less than 2 seconds.     Findings: No ecchymosis, erythema, lesion or wound.  Neurological:     Mental Status: He is alert and oriented to person, place, and time.     GCS: GCS eye subscore is 4. GCS verbal subscore is 5. GCS motor subscore is 6.     Cranial Nerves: Cranial nerves 2-12 are intact.     Sensory: Sensation is intact.     Motor: Motor function is intact. No weakness or abnormal muscle tone.     Coordination: Coordination is intact.  Psychiatric:        Mood and Affect: Mood normal.        Speech: Speech normal.        Behavior: Behavior normal.     ED Results / Procedures / Treatments   Labs (all labs ordered are listed, but only abnormal results are displayed) Labs Reviewed  CBC WITH DIFFERENTIAL/PLATELET - Abnormal; Notable for the following components:      Result Value   HCT 53.3 (*)    All other components within normal limits  COMPREHENSIVE  METABOLIC PANEL - Abnormal; Notable for the following components:   CO2 20 (*)    Glucose, Bld 141 (*)    BUN 36 (*)    Creatinine, Ser 1.85 (*)    GFR, Estimated 37 (*)    All other components  within normal limits  URINALYSIS, ROUTINE W REFLEX MICROSCOPIC - Abnormal; Notable for the following components:   Glucose, UA 50 (*)    Hgb urine dipstick SMALL (*)    Bacteria, UA RARE (*)    All other components within normal limits  PROTIME-INR - Abnormal; Notable for the following components:   Prothrombin Time 57.6 (*)    INR 6.6 (*)    All other components within normal limits  CBG MONITORING, ED - Abnormal; Notable for the following components:   Glucose-Capillary 61 (*)    All other components within normal limits  CBG MONITORING, ED - Abnormal; Notable for the following components:   Glucose-Capillary 56 (*)    All other components within normal limits  LACTIC ACID, PLASMA  CBG MONITORING, ED  TROPONIN I (HIGH SENSITIVITY)    EKG None  Radiology No results found.  Procedures Procedures    Medications Ordered in ED Medications  dextrose 5 % solution (has no administration in time range)  dextrose 50 % solution 25 mL (25 mLs Intravenous Given 02/14/23 0137)  dextrose 50 % solution 25 mL (25 mLs Intravenous Given 02/14/23 0249)    ED Course/ Medical Decision Making/ A&P                             Medical Decision Making Amount and/or Complexity of Data Reviewed Labs: ordered.  Risk Prescription drug management.   Patient with history of diabetes, currently maintained on metformin, glyburide and Actos, presents to the emergency department with refractory hypoglycemia.  Patient reports no new medication changes.  Patient's blood sugar has been running low for the last 24 hours.  EMS has had multiple visits to the home, finally transported him tonight.  Patient received oral and IV glucose during transport.  Multiple checks here have revealed rapidly returning  hypoglycemia after IV dextrose and eating.  He is currently on a D5 drip.  Will require hospitalization.        Final Clinical Impression(s) / ED Diagnoses Final diagnoses:  Hypoglycemia    Rx / DC Orders ED Discharge Orders     None         Tenae Graziosi, Canary Brim, MD 02/14/23 [phone]

## 2023-02-14 NOTE — ED Notes (Signed)
Current blood sugar value recorded with POC glucometer is 61. Patient provided with food and a carton of milk.

## 2023-02-14 NOTE — Progress Notes (Signed)
ANTICOAGULATION CONSULT NOTE - Initial Consult  Pharmacy Consult for warfarin Indication: hx pulmonary embolus  Allergies  Allergen Reactions   Doxycycline Other (See Comments)    BPH can't void when taking    Penicillins Rash and Itching    Patient Measurements: Height: 5\' 9"  (175.3 cm) Weight: 95.3 kg (210 lb) IBW/kg (Calculated) : 70.7  Vital Signs: Temp: 97.7 F (36.5 C) (05/18 0051) Temp Source: Oral (05/18 0051) BP: 169/69 (05/18 0400) Pulse Rate: 69 (05/18 0400)  Labs: Recent Labs    02/14/23 0117 02/14/23 0448  HGB 16.9 16.4  HCT 53.3* 51.1  PLT 187 176  LABPROT 57.6*  --   INR 6.6*  --   CREATININE 1.85* 1.89*  TROPONINIHS 10  --     Estimated Creatinine Clearance: 37.3 mL/min (A) (by C-G formula based on SCr of 1.89 mg/dL (H)).   Medical History: Past Medical History:  Diagnosis Date   Acquired dilation of ascending aorta and aortic root (HCC)    a.) measurements by TTE in 04/2021 --> root 40 mm and ascending aorta 43 mm   Anginal pain (HCC)    Anxiety    Atrial flutter (HCC) 04/22/2021   a.) CHADS2VASc = 5 (age x 2, HTN, T2DM, CAD); b.) daily warfarin   Carotid disease, bilateral (HCC)    a.) Doppler in 2019 --> 1-39% stenosis on RIGHT and 40-58% on LEFT   Cataracts, bilateral    Chronic anticoagulation    Warfarin   Chronic renal insufficiency    Coronary artery disease    a.) s/p 3v CABG in 1999   Depression    GERD (gastroesophageal reflux disease)    History of kidney stones    Hypercholesteremia    Hypertension    Kidney stone    Low testosterone    on TRT injections   Malignant melanoma (HCC)    a.) face, nose, left leg; b.) Tx'd with chemotherapy + XRT at [facility]   Myocardial infarction [facility])    Pulmonary emboli (HCC)    S/P CABG x 3 1999   a.) LIMA-LAD, SVG-RCA, SVG-diagnonal   Sleep apnea    not using nocturnal PAP therapy   T2DM (type 2 diabetes mellitus) (HCC)     Medications:  (Not in a hospital admission)    Assessment: 77 yom on warfarin PTA for hx PE admitted with hypoglycemia. INR is supratherapeutic on admission at 6.6. No bleeding noted, CBC is normal.   PTA dosing: pending med hx  Goal of Therapy:  INR 2-3 Monitor platelets by anticoagulation protocol: Yes   Plan:  Hold warfarin tonight Daily INR Monitor for s/sx of bleeding F/u PTA warfarin dosing  Thank you for involving pharmacy in this patient's care.  Loura Back, PharmD, BCPS Clinical Pharmacist 02/14/2023 5:53 AM

## 2023-02-14 NOTE — ED Notes (Signed)
ED TO INPATIENT HANDOFF REPORT  ED Nurse Name and Phone #: Theadora Rama RN  S Name/Age/Gender Evan [name] 77 y.o. male Room/Bed: 015C/015C  Code Status   Code Status: Full Code  Home/SNF/Other Home Patient oriented to: self, place, time, and situation Is this baseline? Yes   Triage Complete: Triage complete  Chief Complaint Hypoglycemia [E16.2]  Triage Note No notes on file   Allergies Allergies  Allergen Reactions   Doxycycline Other (See Comments)    BPH can't void when taking    Penicillins Rash and Itching    Level of Care/Admitting Diagnosis ED Disposition     ED Disposition  Admit   Condition  --   Comment  Hospital Area: [facility] [[id number]]  Level of Care: Telemetry Medical [104]  May place patient in observation at [facility] or [facility] if equivalent level of care is available:: Yes  Covid Evaluation: Asymptomatic - no recent exposure (last 10 days) testing not required  Diagnosis: Hypoglycemia [242204]  Admitting Physician: Darlin Drop [[id number]]  Attending Physician: Darlin Drop [[id number]]          B Medical/Surgery History Past Medical History:  Diagnosis Date   Acquired dilation of ascending aorta and aortic root (HCC)    a.) measurements by TTE in 04/2021 --> root 40 mm and ascending aorta 43 mm   Anginal pain (HCC)    Anxiety    Atrial flutter (HCC) 04/22/2021   a.) CHADS2VASc = 5 (age x 2, HTN, T2DM, CAD); b.) daily warfarin   Carotid disease, bilateral (HCC)    a.) Doppler in 2019 --> 1-39% stenosis on RIGHT and 40-58% on LEFT   Cataracts, bilateral    Chronic anticoagulation    Warfarin   Chronic renal insufficiency    Coronary artery disease    a.) s/p 3v CABG in 1999   Depression    GERD (gastroesophageal reflux disease)    History of kidney stones    Hypercholesteremia    Hypertension    Kidney stone    Low testosterone    on TRT injections   Malignant melanoma (HCC)    a.) face,  nose, left leg; b.) Tx'd with chemotherapy + XRT at [facility]   Myocardial infarction [facility])    Pulmonary emboli (HCC)    S/P CABG x 3 1999   a.) LIMA-LAD, SVG-RCA, SVG-diagnonal   Sleep apnea    not using nocturnal PAP therapy   T2DM (type 2 diabetes mellitus) (HCC)    Past Surgical History:  Procedure Laterality Date   CARDIAC CATHETERIZATION     x3     last 1999   CARDIOVERSION N/A 08/12/2021   Procedure: CARDIOVERSION;  Surgeon: Pricilla Riffle, MD;  Location: [facility] ENDOSCOPY;  Service: Cardiovascular;  Laterality: N/A;   CATARACT EXTRACTION Bilateral    COLONOSCOPY WITH PROPOFOL N/A 09/26/2014   Procedure: COLONOSCOPY WITH PROPOFOL;  Surgeon: Charolett Bumpers, MD;  Location: [facility] ENDOSCOPY;  Service: Endoscopy;  Laterality: N/A;   CORONARY ARTERY BYPASS GRAFT N/A 1999   3v; LIMA-LAD, SVG-RCA, SVG-diagonal   HOLEP-LASER ENUCLEATION OF THE PROSTATE WITH MORCELLATION N/A 06/07/2021   Procedure: HOLEP-LASER ENUCLEATION OF THE PROSTATE WITH MORCELLATION;  Surgeon: Sondra Come, MD;  Location: [facility] ORS;  Service: Urology;  Laterality: N/A;   KNEE ARTHROSCOPY Left    LAPAROSCOPIC CHOLECYSTECTOMY     MASS EXCISION  04/15/2012   Procedure: EXCISION MASS;  Surgeon: Suzanna Obey, MD;  Location: [facility] OR;  Service: ENT;  Laterality: Left;  Left tonsil biopsy   melanomia     several skin ca removed-nose, left ankle, parotid gland left, right nodes-being followed annually..     A IV Location/Drains/Wounds Patient Lines/Drains/Airways Status     Active Line/Drains/Airways     Name Placement date Placement time Site Days   Peripheral IV 02/14/23 20 G Right Antecubital 02/14/23  0038  Antecubital  less than 1            Intake/Output Last 24 hours  Intake/Output Summary (Last 24 hours) at 02/14/2023 0535 Last data filed at 02/14/2023 0356 Gross per 24 hour  Intake 96.31 ml  Output --  Net 96.31 ml    Labs/Imaging Results for orders placed or performed during the hospital encounter of  02/14/23 (from the past 48 hour(s))  CBG monitoring, ED     Status: Abnormal   Collection Time: 02/14/23  1:11 AM  Result Value Ref Range   Glucose-Capillary 61 (L) 70 - 99 mg/dL    Comment: Glucose reference range applies only to samples taken after fasting for at least 8 hours.  CBC with Differential/Platelet     Status: Abnormal   Collection Time: 02/14/23  1:17 AM  Result Value Ref Range   WBC 7.0 4.0 - 10.5 K/uL   RBC 5.71 4.22 - 5.81 MIL/uL   Hemoglobin 16.9 13.0 - 17.0 g/dL   HCT 16.1 (H) 09.6 - 04.5 %   MCV 93.3 80.0 - 100.0 fL   MCH 29.6 26.0 - 34.0 pg   MCHC 31.7 30.0 - 36.0 g/dL   RDW 40.9 81.1 - 91.4 %   Platelets 187 150 - 400 K/uL   nRBC 0.0 0.0 - 0.2 %   Neutrophils Relative % 73 %   Neutro Abs 5.1 1.7 - 7.7 K/uL   Lymphocytes Relative 19 %   Lymphs Abs 1.3 0.7 - 4.0 K/uL   Monocytes Relative 7 %   Monocytes Absolute 0.5 0.1 - 1.0 K/uL   Eosinophils Relative 1 %   Eosinophils Absolute 0.1 0.0 - 0.5 K/uL   Basophils Relative 0 %   Basophils Absolute 0.0 0.0 - 0.1 K/uL   Immature Granulocytes 0 %   Abs Immature Granulocytes 0.01 0.00 - 0.07 K/uL    Comment: Performed at [facility], [address] [place], [place] [address]  Comprehensive metabolic panel     Status: Abnormal   Collection Time: 02/14/23  1:17 AM  Result Value Ref Range   Sodium 135 135 - 145 mmol/L   Potassium 4.6 3.5 - 5.1 mmol/L   Chloride 105 98 - 111 mmol/L   CO2 20 (L) 22 - 32 mmol/L   Glucose, Bld 141 (H) 70 - 99 mg/dL    Comment: Glucose reference range applies only to samples taken after fasting for at least 8 hours.   BUN 36 (H) 8 - 23 mg/dL   Creatinine, Ser 6.21 (H) 0.61 - 1.24 mg/dL   Calcium 9.6 8.9 - 30.8 mg/dL   Total Protein 6.8 6.5 - 8.1 g/dL   Albumin 3.5 3.5 - 5.0 g/dL   AST 26 15 - 41 U/L   ALT 24 0 - 44 U/L   Alkaline Phosphatase 47 38 - 126 U/L   Total Bilirubin 0.3 0.3 - 1.2 mg/dL   GFR, Estimated 37 (L) >60 mL/min    Comment: (NOTE) Calculated using the  CKD-EPI Creatinine Equation (2021)    Anion gap 10 5 - 15    Comment: Performed  at [facility], [address] [place], [place] [address]  Lactic acid, plasma     Status: None   Collection Time: 02/14/23  1:17 AM  Result Value Ref Range   Lactic Acid, Venous 1.5 0.5 - 1.9 mmol/L    Comment: Performed at [facility], [address] [place], [place] [address]  Troponin I (High Sensitivity)     Status: None   Collection Time: 02/14/23  1:17 AM  Result Value Ref Range   Troponin I (High Sensitivity) 10 <18 ng/L    Comment: (NOTE) Elevated high sensitivity troponin I (hsTnI) values and significant  changes across serial measurements may suggest ACS but many other  chronic and acute conditions are known to elevate hsTnI results.  Refer to the "Links" section for chest pain algorithms and additional  guidance. Performed at [facility], [address], [place], [place] [address]   Protime-INR     Status: Abnormal   Collection Time: 02/14/23  1:17 AM  Result Value Ref Range   Prothrombin Time 57.6 (H) 11.4 - 15.2 seconds   INR 6.6 (HH) 0.8 - 1.2    Comment: REPEATED TO VERIFY CRITICAL RESULT CALLED TO, READ BACK BY AND VERIFIED WITH: Theadora Rama, RN 02/14/2023 0209 BTAYLOR (NOTE) INR goal varies based on device and disease states. Performed at [facility], [address], [place], [place] [address]   CBG monitoring, ED     Status: None   Collection Time: 02/14/23  2:06 AM  Result Value Ref Range   Glucose-Capillary 76 70 - 99 mg/dL    Comment: Glucose reference range applies only to samples taken after fasting for at least 8 hours.  Urinalysis, Routine w reflex microscopic -Urine, Clean Catch     Status: Abnormal   Collection Time: 02/14/23  2:10 AM  Result Value Ref Range   Color, Urine YELLOW YELLOW   APPearance CLEAR CLEAR   Specific Gravity, Urine 1.016 1.005 - 1.030   pH 5.0 5.0 - 8.0   Glucose, UA 50 (A) NEGATIVE mg/dL   Hgb urine dipstick  SMALL (A) NEGATIVE   Bilirubin Urine NEGATIVE NEGATIVE   Ketones, ur NEGATIVE NEGATIVE mg/dL   Protein, ur NEGATIVE NEGATIVE mg/dL   Nitrite NEGATIVE NEGATIVE   Leukocytes,Ua NEGATIVE NEGATIVE   RBC / HPF 0-5 0 - 5 RBC/hpf   WBC, UA 0-5 0 - 5 WBC/hpf   Bacteria, UA RARE (A) NONE SEEN   Squamous Epithelial / HPF 0-5 0 - 5 /HPF   Mucus PRESENT    Hyaline Casts, UA PRESENT     Comment: Performed at [facility], [address] [place], [place] [address]  CBG monitoring, ED     Status: Abnormal   Collection Time: 02/14/23  2:44 AM  Result Value Ref Range   Glucose-Capillary 56 (L) 70 - 99 mg/dL    Comment: Glucose reference range applies only to samples taken after fasting for at least 8 hours.  CBG monitoring, ED     Status: None   Collection Time: 02/14/23  4:02 AM  Result Value Ref Range   Glucose-Capillary 88 70 - 99 mg/dL    Comment: Glucose reference range applies only to samples taken after fasting for at least 8 hours.  Hemoglobin A1c     Status: Abnormal   Collection Time: 02/14/23  4:48 AM  Result Value Ref Range   Hgb A1c MFr Bld 5.9 (H) 4.8 - 5.6 %  Comment: (NOTE) Pre diabetes:          5.7%-6.4%  Diabetes:              >6.4%  Glycemic control for   <7.0% adults with diabetes    Mean Plasma Glucose 122.63 mg/dL    Comment: Performed at [facility], [address] [place], [place] [address]  Basic metabolic panel     Status: Abnormal   Collection Time: 02/14/23  4:48 AM  Result Value Ref Range   Sodium 133 (L) 135 - 145 mmol/L   Potassium 5.1 3.5 - 5.1 mmol/L   Chloride 104 98 - 111 mmol/L   CO2 22 22 - 32 mmol/L   Glucose, Bld 63 (L) 70 - 99 mg/dL    Comment: Glucose reference range applies only to samples taken after fasting for at least 8 hours.   BUN 35 (H) 8 - 23 mg/dL   Creatinine, Ser 6.04 (H) 0.61 - 1.24 mg/dL   Calcium 9.4 8.9 - 54.0 mg/dL   GFR, Estimated 36 (L) >60 mL/min    Comment: (NOTE) Calculated using the CKD-EPI Creatinine  Equation (2021)    Anion gap 7 5 - 15    Comment: Performed at [facility], [address], [place], [place] [address]  CBC     Status: None   Collection Time: 02/14/23  4:48 AM  Result Value Ref Range   WBC 8.0 4.0 - 10.5 K/uL   RBC 5.47 4.22 - 5.81 MIL/uL   Hemoglobin 16.4 13.0 - 17.0 g/dL   HCT 14.7 82.9 - 56.2 %   MCV 93.4 80.0 - 100.0 fL   MCH 30.0 26.0 - 34.0 pg   MCHC 32.1 30.0 - 36.0 g/dL   RDW 13.0 86.5 - 78.4 %   Platelets 176 150 - 400 K/uL   nRBC 0.0 0.0 - 0.2 %    Comment: Performed at [facility], [address] [place], [place] [address]   No results found.  Pending Labs Unresulted Labs (From admission, onward)     Start     Ordered   02/14/23 0500  Protime-INR  Daily,   R      02/14/23 0355            Vitals/Pain Today's Vitals   02/14/23 0115 02/14/23 0200 02/14/23 0300 02/14/23 0400  BP:  (!) 150/77 126/67 (!) 169/69  Pulse: 82 71 66 69  Resp:      Temp:      TempSrc:      SpO2: 100% 99% 99% 100%  Weight:      Height:      PainSc:        Isolation Precautions No active isolations  Medications Medications  dextrose 10 % infusion ( Intravenous New Bag/Given 02/14/23 0354)  rosuvastatin (CRESTOR) tablet 20 mg (has no administration in time range)  citalopram (CELEXA) tablet 40 mg (has no administration in time range)  acetaminophen (TYLENOL) tablet 650 mg (has no administration in time range)  prochlorperazine (COMPAZINE) injection 5 mg (has no administration in time range)  polyethylene glycol (MIRALAX / GLYCOLAX) packet 17 g (has no administration in time range)  omega-3 acid ethyl esters (LOVAZA) capsule 2 g (has no administration in time range)  pantoprazole (PROTONIX) EC tablet 40 mg (has no administration in time range)  dextrose 50 % solution 25 mL (25 mLs Intravenous Given 02/14/23 0137)  dextrose 50 % solution 25 mL (25 mLs Intravenous Given  02/14/23 0249)    Mobility walks     Focused Assessments Neuro  Assessment Handoff:  Swallow screen pass? Yes          Neuro Assessment:   Neuro Checks:      Has TPA been given? No If patient is a Neuro Trauma and patient is going to OR before floor call report to 4N Charge nurse: [phone] or [phone]  , Pulmonary Assessment Handoff:  Lung sounds:   O2 Device: Room Air      R Recommendations: See Admitting Provider Note  Report given to:   Additional Notes:

## 2023-02-14 NOTE — H&P (Signed)
History and Physical  Evan [name] ZOX:[id number] DOB: 1945-12-02 DOA: 02/14/2023  Referring physician: Dr. Blinda Leatherwood, EDP  PCP: Emilio Aspen, MD  Outpatient Specialists: Cardiology Patient coming from: Home  Chief Complaint: Low blood sugar  HPI: Evan [name] is a 77 y.o. male with medical history significant for type 2 diabetes on glyburide and metformin, hyperlipidemia, hypertension, obesity, history of pulmonary embolism on Coumadin, chronic anxiety/depression, GERD, who presented to [facility] ED from home via EMS due to low blood sugar.  Per his wife at bedside, he was difficult to arouse, she checked his blood sugar and it was 23.  She called EMS and tried to correct it with house food items.  Upon EMS arrival his blood sugar was in the 40's.  The patient was given p.o. glucose with mild improvement.  En route he also received IV D10.  In the ED, the patient received D50 and had recurrence of hypoglycemia.  Due to refractory hypoglycemia, EDP requested admission for further management.  Admitted by [facility], hospitalist service.  ED Course: Temperature 97.7.  BP 127/67, pulse 66, respiration rate 19, O2 saturation 100% on room air.  Lab studies notable for serum bicarb 20, glucose 141, BUN 36, creatinine 1.85, GFR 37.  Lactic acid 1.5.  Troponin 10.  INR 6.6.  Review of Systems: Review of systems as noted in the HPI. All other systems reviewed and are negative.   Past Medical History:  Diagnosis Date   Acquired dilation of ascending aorta and aortic root (HCC)    a.) measurements by TTE in 04/2021 --> root 40 mm and ascending aorta 43 mm   Anginal pain (HCC)    Anxiety    Atrial flutter (HCC) 04/22/2021   a.) CHADS2VASc = 5 (age x 2, HTN, T2DM, CAD); b.) daily warfarin   Carotid disease, bilateral (HCC)    a.) Doppler in 2019 --> 1-39% stenosis on RIGHT and 40-58% on LEFT   Cataracts, bilateral    Chronic anticoagulation    Warfarin   Chronic renal insufficiency     Coronary artery disease    a.) s/p 3v CABG in 1999   Depression    GERD (gastroesophageal reflux disease)    History of kidney stones    Hypercholesteremia    Hypertension    Kidney stone    Low testosterone    on TRT injections   Malignant melanoma (HCC)    a.) face, nose, left leg; b.) Tx'd with chemotherapy + XRT at [facility]   Myocardial infarction [facility])    Pulmonary emboli (HCC)    S/P CABG x 3 1999   a.) LIMA-LAD, SVG-RCA, SVG-diagnonal   Sleep apnea    not using nocturnal PAP therapy   T2DM (type 2 diabetes mellitus) (HCC)    Past Surgical History:  Procedure Laterality Date   CARDIAC CATHETERIZATION     x3     last 1999   CARDIOVERSION N/A 08/12/2021   Procedure: CARDIOVERSION;  Surgeon: Pricilla Riffle, MD;  Location: [facility] ENDOSCOPY;  Service: Cardiovascular;  Laterality: N/A;   CATARACT EXTRACTION Bilateral    COLONOSCOPY WITH PROPOFOL N/A 09/26/2014   Procedure: COLONOSCOPY WITH PROPOFOL;  Surgeon: Charolett Bumpers, MD;  Location: [facility] ENDOSCOPY;  Service: Endoscopy;  Laterality: N/A;   CORONARY ARTERY BYPASS GRAFT N/A 1999   3v; LIMA-LAD, SVG-RCA, SVG-diagonal   HOLEP-LASER ENUCLEATION OF THE PROSTATE WITH MORCELLATION N/A 06/07/2021   Procedure: HOLEP-LASER ENUCLEATION OF THE PROSTATE WITH MORCELLATION;  Surgeon: Sondra Come, MD;  Location: [facility] ORS;  Service: Urology;  Laterality: N/A;   KNEE ARTHROSCOPY Left    LAPAROSCOPIC CHOLECYSTECTOMY     MASS EXCISION  04/15/2012   Procedure: EXCISION MASS;  Surgeon: Suzanna Obey, MD;  Location: [facility] OR;  Service: ENT;  Laterality: Left;  Left tonsil biopsy   melanomia     several skin ca removed-nose, left ankle, parotid gland left, right nodes-being followed annually..    Social History:  reports that he has never smoked. He has never used smokeless tobacco. He reports that he does not drink alcohol and does not use drugs.   Allergies  Allergen Reactions   Doxycycline Other (See Comments)    BPH can't void when taking     Penicillins Rash and Itching    Family history: None reported.  Prior to Admission medications   Medication Sig Start Date End Date Taking? Authorizing Provider  acetaminophen (TYLENOL) 325 MG tablet Take 2 tablets (650 mg total) by mouth every 6 (six) hours as needed for mild pain (or Fever >/= 101). 04/12/22   Sheikh, Omair Latif, DO  ANUCORT-HC 25 MG suppository Place 25 mg rectally daily as needed for hemorrhoids or anal itching. 12/19/20   [provider]  citalopram (CELEXA) 40 MG tablet Take 40 mg by mouth every morning.    [provider]  fenofibrate micronized (LOFIBRA) 67 MG capsule TAKE 1 CAPSULE DAILY (CALL OFFICE TO SCHEDULE YEARLY APPOINTMENT WITH DR. Anne Fu FOR NOVEMBER 2023 BEFORE ANY MORE REFILLS [phone]) 11/24/22   Jake Bathe, MD  fluticasone (CUTIVATE) 0.05 % cream Apply 1 Application topically 2 (two) times daily as needed (rash). 10/22/20   [provider]  glyBURIDE (DIABETA) 5 MG tablet Take 5 mg by mouth every evening.    [provider]  metFORMIN (GLUCOPHAGE-XR) 500 MG 24 hr tablet Take 2 tablets (1,000 mg total) by mouth 2 (two) times daily. 09/26/22   Jake Bathe, MD  metoprolol succinate (TOPROL-XL) 50 MG 24 hr tablet TAKE 1 TABLET DAILY WITH OR IMMEDIATELY FOLLOWING A MEAL 08/29/22   Jake Bathe, MD  nitroGLYCERIN (NITROSTAT) 0.4 MG SL tablet Place 0.4 mg under the tongue every 5 (five) minutes as needed for chest pain. 12/14/20   [provider]  omega-3 acid ethyl esters (LOVAZA) 1 g capsule Take 2 capsules (2 g total) by mouth 2 (two) times daily. 05/28/22   Jake Bathe, MD  ondansetron (ZOFRAN) 4 MG tablet Take 1 tablet (4 mg total) by mouth every 6 (six) hours as needed for nausea. 04/12/22   Marguerita Merles Latif, DO  pantoprazole (PROTONIX) 40 MG tablet Take 40 mg by mouth daily.    [provider]  pioglitazone (ACTOS) 30 MG tablet Take 30 mg by mouth every morning.    [provider]   polyethylene glycol (MIRALAX / GLYCOLAX) 17 g packet Take 17 g by mouth daily as needed for mild constipation. 04/12/22   Marguerita Merles Latif, DO  rosuvastatin (CRESTOR) 20 MG tablet Take 20 mg by mouth every evening.    [provider]  valsartan (DIOVAN) 160 MG tablet Take 80 mg by mouth every morning.    [provider]  warfarin (COUMADIN) 5 MG tablet Take 0.5-1 tablets (2.5-5 mg total) by mouth See admin instructions. As directed--1/2 tab ( 2.5 mg)  every Monday,Tuesday, and Wednesday; 1 tab  ( 5 mg)  all other days 04/15/22   Merlene Laughter, DO    Physical Exam: BP 136/63  Pulse 82   Temp 97.7 F (36.5 C) (Oral)   Resp 19   Ht 5\' 9"  (1.753 m)   Wt 95.3 kg   SpO2 100%   BMI 31.01 kg/m   General: 77 y.o. year-old male well developed well nourished in no acute distress.  Somnolent but easily arousable to voices. Cardiovascular: Regular rate and rhythm with no rubs or gallops.  No thyromegaly or JVD noted.  No lower extremity edema. 2/4 pulses in all 4 extremities. Respiratory: Clear to auscultation with no wheezes or rales. Good inspiratory effort. Abdomen: Soft nontender nondistended with normal bowel sounds x4 quadrants. Muskuloskeletal: No cyanosis, clubbing or edema noted bilaterally Neuro: CN II-XII intact, strength, sensation, reflexes Skin: No ulcerative lesions noted or rashes Psychiatry: Judgement and insight appear normal. Mood is appropriate for condition and setting          Labs on Admission:  Basic Metabolic Panel: Recent Labs  Lab 02/14/23 0117  NA 135  K 4.6  CL 105  CO2 20*  GLUCOSE 141*  BUN 36*  CREATININE 1.85*  CALCIUM 9.6   Liver Function Tests: Recent Labs  Lab 02/14/23 0117  AST 26  ALT 24  ALKPHOS 47  BILITOT 0.3  PROT 6.8  ALBUMIN 3.5   No results for input(s): "LIPASE", "AMYLASE" in the last 168 hours. No results for input(s): "AMMONIA" in the last 168 hours. CBC: Recent Labs  Lab 02/14/23 0117  WBC 7.0   NEUTROABS 5.1  HGB 16.9  HCT 53.3*  MCV 93.3  PLT 187   Cardiac Enzymes: No results for input(s): "CKTOTAL", "CKMB", "CKMBINDEX", "TROPONINI" in the last 168 hours.  BNP (last 3 results) No results for input(s): "BNP" in the last 8760 hours.  ProBNP (last 3 results) No results for input(s): "PROBNP" in the last 8760 hours.  CBG: Recent Labs  Lab 02/14/23 0111 02/14/23 0206 02/14/23 0244  GLUCAP 61* 76 56*    Radiological Exams on Admission: No results found.  EKG: I independently viewed the EKG done and my findings are as followed: None available at the time of this visit.  Assessment/Plan Present on Admission:  Hypoglycemia  Principal Problem:   Hypoglycemia  Refractory hypoglycemia in the setting of type 2 diabetes on sulfonylureas, likely worsened by CKD 3B Patient is on glyburide, avoid use in the setting of CKD 3B D10 at 30 cc/h, check CBG every 1 hour x 3, if stable continue D10 and check CBG ACHS Obtain A1c  CKD 3B Appears to be at his baseline creatinine 1.85 with GFR 37 Continue to avoid nephrotoxic agents, dehydration and hypotension. The patient is on metformin, it would be contraindicated with GFR less than 30. Risks and benefits of continuing metformin therapy should be evaluated by PCP with GFR less than 45. Continue to monitor renal function.  Mild non-anion gap metabolic acidosis Serum bicarb 20, anion gap of 10 Lactic acid is normal at 1.5 Gentle IV fluid hydration Repeat BMP in the morning  Hyperlipidemia Resume home Crestor  History of PE on Coumadin Hold off home Coumadin due to supratherapeutic INR INR 6.6  Supratherapeutic INR INR 6.6 On Coumadin held Coumadin managed by pharmacy, appreciate assistance  GERD Resume home Protonix.  Essential hypertension BPs are currently soft Hold off home oral antihypertensives to avoid hypotension. Monitor vital signs.  Obesity BMI 31 Recommend weight loss outpatient with regular  physical activity and healthy dieting.    DVT prophylaxis: Supratherapeutic INR of 6.6 on Coumadin, held.  Code Status: Full  code  Family Communication: Wife at bedside.  Disposition Plan: Admitted to telemetry medical unit  Consults called: None.  Admission status: Observation status   Status is: Observation    Darlin Drop MD [facility] Pager [phone]  If 7PM-7AM, please contact night-coverage [email or web address] Password [email or web address]  02/14/2023, 3:30 AM

## 2023-02-14 NOTE — Progress Notes (Signed)
PROGRESS NOTE    Evan [name]  ZOX:[id number] DOB: 04/19/1946 DOA: 02/14/2023 PCP: Emilio Aspen, MD  Outpatient Specialists:     Brief Narrative:  Patient is a 77 year old male with type 2 diabetes mellitus on glyburide and metformin, chronic kidney disease stage IIIb, obesity, hypertension, hyperlipidemia, pulmonary embolism on Coumadin, GERD and chronic anxiety/depression.  Apparently, patient's blood sugar was noted to be 23 at home.  On EMS arrival, blood sugar was noted to be 40s.  Glyburide has been discontinued.  Metformin is on hold.  Blood sugar has been monitored.  02/14/2023: Blood sugars currently running in the 200 ranges.  Will discontinue D10.  Continue to monitor blood sugar closely.   Assessment & Plan:   Principal Problem:   Hypoglycemia   Refractory hypoglycemia in the setting of type 2 diabetes on sulfonylureas, likely worsened by CKD 3B -Patient has memory challenges. -Patient self administers medications. -Patient was on glyburide and metformin. -Glyburide/metformin are currently on hold. -Patient was managed with D10.  We discontinue D10. -Blood sugars in the 200 ranges.  Continue to monitor closely.     CKD 3B -Consider ACE inhibitor or ARB, as well as, SGLT2 inhibitor on discharge.   -Consider Kerendia on discharge as well. -Continue to avoid nephrotoxins. -Keep MAP greater than 65 mmHg. -Dose all medications considering impaired renal function. -Monitor renal function and electrolytes.   -Suspect CKD secondary to combined diabetes mellitus and hypertension.   Metabolic acidosis: -CO2 was 20 on presentation. -Goal is to keep CO2 greater than 21. -Low threshold to use sodium bicarbonate. -Current CO2 is 22. -Acidosis is secondary to chronic kidney disease.     Hyperlipidemia Continue Crestor.     History of PE on Coumadin INR of 6.6. -No bleeding. -Pharmacy to dose Coumadin.  However, will hold Coumadin for now.      Supratherapeutic INR INR 6.6 On Coumadin held Coumadin managed by pharmacy, appreciate assistance   GERD Resume home Protonix.   Essential hypertension BPs are currently soft Hold off home oral antihypertensives to avoid hypotension. Monitor vital signs. 02/14/2023: Low threshold to start ACE inhibitor.   Obesity BMI 31 Recommend weight loss outpatient with regular physical activity and healthy dieting.       DVT prophylaxis: Coumadin. Code Status: Full code. Family Communication: Patient's wife. Disposition Plan: Home eventually.     Consultants:  None  Procedures:  None  Antimicrobials:  None   Subjective: No new complaints.  Objective: Vitals:   02/14/23 0622 02/14/23 0653 02/14/23 0936 02/14/23 0936  BP: (!) 162/79 (!) 168/80 (!) 162/81 (!) 162/81  Pulse: 71 65 64 64  Resp: 18 16 18 17   Temp: (!) 97.4 F (36.3 C) (!) 97.5 F (36.4 C) 98.3 F (36.8 C) 98.3 F (36.8 C)  TempSrc: Oral Oral    SpO2: 100% 99% 100% 100%  Weight:      Height:        Intake/Output Summary (Last 24 hours) at 02/14/2023 1051 Last data filed at 02/14/2023 0800 Gross per 24 hour  Intake 396.31 ml  Output 500 ml  Net -103.69 ml   Filed Weights   02/14/23 0056  Weight: 95.3 kg    Examination:  General exam: Appears calm and comfortable.  Patient is awake and alert. Respiratory system: Clear to auscultation. . Cardiovascular system: S1 & S2. Gastrointestinal system: Abdomen is soft and nontender.   Central nervous system: Awake and alert.  Moves all extremities.  Memory challenges.   Extremities: No leg  edema.  Data Reviewed: I have personally reviewed following labs and imaging studies  CBC: Recent Labs  Lab 02/14/23 0117 02/14/23 0448  WBC 7.0 8.0  NEUTROABS 5.1  --   HGB 16.9 16.4  HCT 53.3* 51.1  MCV 93.3 93.4  PLT 187 176   Basic Metabolic Panel: Recent Labs  Lab 02/14/23 0117 02/14/23 0448  NA 135 133*  K 4.6 5.1  CL 105 104  CO2 20* 22   GLUCOSE 141* 63*  BUN 36* 35*  CREATININE 1.85* 1.89*  CALCIUM 9.6 9.4   GFR: Estimated Creatinine Clearance: 37.3 mL/min (A) (by C-G formula based on SCr of 1.89 mg/dL (H)). Liver Function Tests: Recent Labs  Lab 02/14/23 0117  AST 26  ALT 24  ALKPHOS 47  BILITOT 0.3  PROT 6.8  ALBUMIN 3.5   No results for input(s): "LIPASE", "AMYLASE" in the last 168 hours. No results for input(s): "AMMONIA" in the last 168 hours. Coagulation Profile: Recent Labs  Lab 02/14/23 0117 02/14/23 0448  INR 6.6* 6.7*   Cardiac Enzymes: No results for input(s): "CKTOTAL", "CKMB", "CKMBINDEX", "TROPONINI" in the last 168 hours. BNP (last 3 results) No results for input(s): "PROBNP" in the last 8760 hours. HbA1C: Recent Labs    02/14/23 0448  HGBA1C 5.9*   CBG: Recent Labs  Lab 02/14/23 0244 02/14/23 0402 02/14/23 0626 02/14/23 0728 02/14/23 1014  GLUCAP 56* 88 84 111* 165*   Lipid Profile: No results for input(s): "CHOL", "HDL", "LDLCALC", "TRIG", "CHOLHDL", "LDLDIRECT" in the last 72 hours. Thyroid Function Tests: No results for input(s): "TSH", "T4TOTAL", "FREET4", "T3FREE", "THYROIDAB" in the last 72 hours. Anemia Panel: No results for input(s): "VITAMINB12", "FOLATE", "FERRITIN", "TIBC", "IRON", "RETICCTPCT" in the last 72 hours. Urine analysis:    Component Value Date/Time   COLORURINE YELLOW 02/14/2023 0210   APPEARANCEUR CLEAR 02/14/2023 0210   APPEARANCEUR Clear 02/27/2021 1628   LABSPEC 1.016 02/14/2023 0210   PHURINE 5.0 02/14/2023 0210   GLUCOSEU 50 (A) 02/14/2023 0210   HGBUR SMALL (A) 02/14/2023 0210   BILIRUBINUR NEGATIVE 02/14/2023 0210   BILIRUBINUR Negative 02/27/2021 1628   KETONESUR NEGATIVE 02/14/2023 0210   PROTEINUR NEGATIVE 02/14/2023 0210   UROBILINOGEN 0.2 01/30/2013 1850   NITRITE NEGATIVE 02/14/2023 0210   LEUKOCYTESUR NEGATIVE 02/14/2023 0210   Sepsis Labs: @LABRCNTIP (procalcitonin:4,lacticidven:4)  )No results found for this or any  previous visit (from the past 240 hour(s)).       Radiology Studies: No results found.      Scheduled Meds:  citalopram  40 mg Oral q morning   omega-3 acid ethyl esters  2 capsule Oral BID   pantoprazole  40 mg Oral Daily   rosuvastatin  20 mg Oral QPM   Warfarin - Pharmacist Dosing Inpatient   Does not apply q1600   Continuous Infusions:  dextrose 30 mL/hr at 02/14/23 0354     LOS: 0 days    Time spent: 35 minutes.    Berton Mount, MD  [facility] Pager #: [phone] 7PM-7AM contact night coverage as above

## 2023-02-14 NOTE — ED Notes (Signed)
POC glucose by glucometer 84

## 2023-02-15 DIAGNOSIS — E162 Hypoglycemia, unspecified: Secondary | ICD-10-CM | POA: Diagnosis not present

## 2023-02-15 DIAGNOSIS — E11649 Type 2 diabetes mellitus with hypoglycemia without coma: Secondary | ICD-10-CM | POA: Diagnosis not present

## 2023-02-15 LAB — GLUCOSE, CAPILLARY
Glucose-Capillary: 128 mg/dL — ABNORMAL HIGH (ref 70–99)
Glucose-Capillary: 130 mg/dL — ABNORMAL HIGH (ref 70–99)
Glucose-Capillary: 152 mg/dL — ABNORMAL HIGH (ref 70–99)
Glucose-Capillary: 166 mg/dL — ABNORMAL HIGH (ref 70–99)
Glucose-Capillary: 133 mg/dL — ABNORMAL HIGH (ref 70–99)
Glucose-Capillary: 161 mg/dL — ABNORMAL HIGH (ref 70–99)

## 2023-02-15 LAB — PROTIME-INR
INR: 4.8 (ref 0.8–1.2)
Prothrombin Time: 45 seconds — ABNORMAL HIGH (ref 11.4–15.2)

## 2023-02-15 NOTE — Progress Notes (Signed)
[facility] night cross cover note:   I was notified by RN of critical lab result in the form of INR 4.8, relative to 6.7 yesterday AM. No report of any active bleed.   Per my brief chart review, patient was recently on Warfarin in setting of PE. Warfarin being held for now in setting of supratherapeutic INR.     Newton Pigg, DO Hospitalist

## 2023-02-15 NOTE — Progress Notes (Signed)
ANTICOAGULATION CONSULT NOTE - Initial Consult  Pharmacy Consult for warfarin Indication: hx pulmonary embolus  Allergies  Allergen Reactions   Doxycycline Other (See Comments)    BPH can't void when taking    Penicillins Itching and Rash    Patient Measurements: Height: 5\' 9"  (175.3 cm) Weight: 95.3 kg (210 lb) IBW/kg (Calculated) : 70.7  Vital Signs: Temp: 97.2 F (36.2 C) (05/19 0515) BP: 150/104 (05/19 0515) Pulse Rate: 62 (05/19 0515)  Labs: Recent Labs    02/14/23 0117 02/14/23 0448 02/15/23 0147  HGB 16.9 16.4  --   HCT 53.3* 51.1  --   PLT 187 176  --   LABPROT 57.6* 58.5* 45.0*  INR 6.6* 6.7* 4.8*  CREATININE 1.85* 1.89*  --   TROPONINIHS 10  --   --     Estimated Creatinine Clearance: 37.3 mL/min (A) (by C-G formula based on SCr of 1.89 mg/dL (H)).   Medical History: Past Medical History:  Diagnosis Date   Acquired dilation of ascending aorta and aortic root (HCC)    a.) measurements by TTE in 04/2021 --> root 40 mm and ascending aorta 43 mm   Anginal pain (HCC)    Anxiety    Atrial flutter (HCC) 04/22/2021   a.) CHADS2VASc = 5 (age x 2, HTN, T2DM, CAD); b.) daily warfarin   Carotid disease, bilateral (HCC)    a.) Doppler in 2019 --> 1-39% stenosis on RIGHT and 40-58% on LEFT   Cataracts, bilateral    Chronic anticoagulation    Warfarin   Chronic renal insufficiency    Coronary artery disease    a.) s/p 3v CABG in 1999   Depression    GERD (gastroesophageal reflux disease)    History of kidney stones    Hypercholesteremia    Hypertension    Kidney stone    Low testosterone    on TRT injections   Malignant melanoma (HCC)    a.) face, nose, left leg; b.) Tx'd with chemotherapy + XRT at [facility]   Myocardial infarction [facility])    Pulmonary emboli (HCC)    S/P CABG x 3 1999   a.) LIMA-LAD, SVG-RCA, SVG-diagnonal   Sleep apnea    not using nocturnal PAP therapy   T2DM (type 2 diabetes mellitus) (HCC)     Medications:  Medications Prior  to Admission  Medication Sig Dispense Refill Last Dose   acetaminophen (TYLENOL) 325 MG tablet Take 2 tablets (650 mg total) by mouth every 6 (six) hours as needed for mild pain (or Fever >/= 101). 20 tablet 0    ANUCORT-HC 25 MG suppository Place 25 mg rectally daily as needed for hemorrhoids or anal itching.      citalopram (CELEXA) 40 MG tablet Take 40 mg by mouth every morning.      fenofibrate micronized (LOFIBRA) 67 MG capsule TAKE 1 CAPSULE DAILY (CALL OFFICE TO SCHEDULE YEARLY APPOINTMENT WITH DR. Anne Fu FOR NOVEMBER 2023 BEFORE ANY MORE REFILLS [phone]) 30 capsule 0    fluticasone (CUTIVATE) 0.05 % cream Apply 1 Application topically 2 (two) times daily as needed (rash).      glyBURIDE (DIABETA) 5 MG tablet Take 5 mg by mouth every evening.      metFORMIN (GLUCOPHAGE-XR) 500 MG 24 hr tablet Take 2 tablets (1,000 mg total) by mouth 2 (two) times daily. 90 tablet 2    metoprolol succinate (TOPROL-XL) 50 MG 24 hr tablet TAKE 1 TABLET DAILY WITH OR IMMEDIATELY FOLLOWING A MEAL 30 tablet 0  nitroGLYCERIN (NITROSTAT) 0.4 MG SL tablet Place 0.4 mg under the tongue every 5 (five) minutes as needed for chest pain.      omega-3 acid ethyl esters (LOVAZA) 1 g capsule Take 2 capsules (2 g total) by mouth 2 (two) times daily. 360 capsule 1    ondansetron (ZOFRAN) 4 MG tablet Take 1 tablet (4 mg total) by mouth every 6 (six) hours as needed for nausea. 20 tablet 0    pantoprazole (PROTONIX) 40 MG tablet Take 40 mg by mouth daily.      pioglitazone (ACTOS) 30 MG tablet Take 30 mg by mouth every morning.      polyethylene glycol (MIRALAX / GLYCOLAX) 17 g packet Take 17 g by mouth daily as needed for mild constipation. 14 each 0    rosuvastatin (CRESTOR) 20 MG tablet Take 20 mg by mouth every evening.      valsartan (DIOVAN) 160 MG tablet Take 80 mg by mouth every morning.      warfarin (COUMADIN) 5 MG tablet Take 0.5-1 tablets (2.5-5 mg total) by mouth See admin instructions. As directed--1/2 tab (  2.5 mg)  every Monday,Tuesday, and Wednesday; 1 tab  ( 5 mg)  all other days       Assessment: 77 yom on warfarin PTA for hx PE admitted with refractory hypoglycemia. INR on admission was supratherapeutic at 6.6.  INR remains supratherapeutic, but is trending down, now at 4.8. No bleeding noted, no new CBC today.   PTA dose from fill history is 7.5 mg on Sat/Sun and 5 mg all other days > should be confirmed with actual med history though   Goal of Therapy:  INR 2-3 Monitor platelets by anticoagulation protocol: Yes   Plan:  Continue to hold warfarin for at least 1-2 more days  Daily INR Monitor for s/sx of bleeding F/u PTA warfarin dosing with a medication history   Thank you for involving pharmacy in this patient's care.  Fara Olden, PharmD, BCPS, BCOP Clinical Pharmacist 02/15/2023 8:39 AM

## 2023-02-15 NOTE — Progress Notes (Signed)
PROGRESS NOTE    Evan [name]  ZOX:[id number] DOB: 1945-10-09 DOA: 02/14/2023 PCP: Emilio Aspen, MD  Outpatient Specialists:     Brief Narrative:  Patient is a 77 year old male with type 2 diabetes mellitus on glyburide and metformin, chronic kidney disease stage IIIb, obesity, hypertension, hyperlipidemia, pulmonary embolism on Coumadin, GERD and chronic anxiety/depression.  Apparently, patient's blood sugar was noted to be 23 at home.  On EMS arrival, blood sugar was noted to be 40s.  Glyburide has been discontinued.  Metformin is on hold.  Blood sugar has been monitored.  02/14/2023: Blood sugars currently running in the 200 ranges.  Will discontinue D10.  Continue to monitor blood sugar closely.    02/15/2023: Patient seen.  Calorie count is in progress.  Blood sugar is in 120s.  Continue to hold glyburide.  Metformin is also on hold.  Assessment & Plan:   Principal Problem:   Hypoglycemia   Refractory hypoglycemia in the setting of type 2 diabetes on sulfonylureas, likely worsened by CKD 3B -Patient has memory challenges. -Patient self administers medications. -Patient was on glyburide and metformin. -Glyburide/metformin are currently on hold. -Patient was managed with D10.  We discontinue D10. -Blood sugars in the 200 ranges.  Continue to monitor closely.   02/15/2023: See above documentation.   CKD 3B -Consider ACE inhibitor or ARB, as well as, SGLT2 inhibitor on discharge.   -Consider Kerendia on discharge as well. -Continue to avoid nephrotoxins. -Keep MAP greater than 65 mmHg. -Dose all medications considering impaired renal function. -Monitor renal function and electrolytes.   -Suspect CKD secondary to combined diabetes mellitus and hypertension.   Metabolic acidosis: -CO2 was 20 on presentation. -Goal is to keep CO2 greater than 21. -Low threshold to use sodium bicarbonate. -Current CO2 is 22. -Acidosis is secondary to chronic kidney disease.    02/15/2023: Renal panel in the morning.   Hyperlipidemia Continue Crestor.     History of PE on Coumadin INR of 6.6. -No bleeding. -Pharmacy to dose Coumadin.  However, will hold Coumadin for now.     Supratherapeutic INR INR 6.6 On Coumadin held Coumadin managed by pharmacy, appreciate assistance 02/15/2023: INR is 1.8 today.  Continue to monitor.  No bleeding.   GERD Resume home Protonix.   Essential hypertension BPs are currently soft Hold off home oral antihypertensives to avoid hypotension. Monitor vital signs. 02/14/2023: Low threshold to start ACE inhibitor.   Obesity BMI 31 Recommend weight loss outpatient with regular physical activity and healthy dieting.       DVT prophylaxis: Coumadin. Code Status: Full code. Family Communication: Patient's wife. Disposition Plan: Home eventually.     Consultants:  None  Procedures:  None  Antimicrobials:  None   Subjective: No new complaints.  Objective: Vitals:   02/14/23 1754 02/14/23 1943 02/15/23 0515 02/15/23 0909  BP: (!) 153/82 (!) 166/76 (!) 150/104 (!) 142/85  Pulse: 68 65 62 77  Resp: 18 16 18 17   Temp: 98.2 F (36.8 C) 98.2 F (36.8 C) (!) 97.2 F (36.2 C) 98.1 F (36.7 C)  TempSrc:  Oral    SpO2: 100% 97% 100% 98%  Weight:      Height:        Intake/Output Summary (Last 24 hours) at 02/15/2023 1122 Last data filed at 02/15/2023 0600 Gross per 24 hour  Intake 837 ml  Output 650 ml  Net 187 ml    Filed Weights   02/14/23 0056  Weight: 95.3 kg    Examination:  General exam: Appears calm and comfortable.  Patient is awake and alert. Respiratory system: Clear to auscultation. . Cardiovascular system: S1 & S2. Gastrointestinal system: Abdomen is soft and nontender.   Central nervous system: Awake and alert.  Moves all extremities.  Memory challenges.   Extremities: No leg edema.  Data Reviewed: I have personally reviewed following labs and imaging studies  CBC: Recent Labs   Lab 02/14/23 0117 02/14/23 0448  WBC 7.0 8.0  NEUTROABS 5.1  --   HGB 16.9 16.4  HCT 53.3* 51.1  MCV 93.3 93.4  PLT 187 176    Basic Metabolic Panel: Recent Labs  Lab 02/14/23 0117 02/14/23 0448  NA 135 133*  K 4.6 5.1  CL 105 104  CO2 20* 22  GLUCOSE 141* 63*  BUN 36* 35*  CREATININE 1.85* 1.89*  CALCIUM 9.6 9.4    GFR: Estimated Creatinine Clearance: 37.3 mL/min (A) (by C-G formula based on SCr of 1.89 mg/dL (H)). Liver Function Tests: Recent Labs  Lab 02/14/23 0117  AST 26  ALT 24  ALKPHOS 47  BILITOT 0.3  PROT 6.8  ALBUMIN 3.5    No results for input(s): "LIPASE", "AMYLASE" in the last 168 hours. No results for input(s): "AMMONIA" in the last 168 hours. Coagulation Profile: Recent Labs  Lab 02/14/23 0117 02/14/23 0448 02/15/23 0147  INR 6.6* 6.7* 4.8*    Cardiac Enzymes: No results for input(s): "CKTOTAL", "CKMB", "CKMBINDEX", "TROPONINI" in the last 168 hours. BNP (last 3 results) No results for input(s): "PROBNP" in the last 8760 hours. HbA1C: Recent Labs    02/14/23 0448  HGBA1C 5.9*    CBG: Recent Labs  Lab 02/14/23 1626 02/14/23 2056 02/15/23 0257 02/15/23 0636 02/15/23 0733  GLUCAP 148* 134* 128* 130* 152*    Lipid Profile: No results for input(s): "CHOL", "HDL", "LDLCALC", "TRIG", "CHOLHDL", "LDLDIRECT" in the last 72 hours. Thyroid Function Tests: No results for input(s): "TSH", "T4TOTAL", "FREET4", "T3FREE", "THYROIDAB" in the last 72 hours. Anemia Panel: No results for input(s): "VITAMINB12", "FOLATE", "FERRITIN", "TIBC", "IRON", "RETICCTPCT" in the last 72 hours. Urine analysis:    Component Value Date/Time   COLORURINE YELLOW 02/14/2023 0210   APPEARANCEUR CLEAR 02/14/2023 0210   APPEARANCEUR Clear 02/27/2021 1628   LABSPEC 1.016 02/14/2023 0210   PHURINE 5.0 02/14/2023 0210   GLUCOSEU 50 (A) 02/14/2023 0210   HGBUR SMALL (A) 02/14/2023 0210   BILIRUBINUR NEGATIVE 02/14/2023 0210   BILIRUBINUR Negative  02/27/2021 1628   KETONESUR NEGATIVE 02/14/2023 0210   PROTEINUR NEGATIVE 02/14/2023 0210   UROBILINOGEN 0.2 01/30/2013 1850   NITRITE NEGATIVE 02/14/2023 0210   LEUKOCYTESUR NEGATIVE 02/14/2023 0210   Sepsis Labs: @LABRCNTIP (procalcitonin:4,lacticidven:4)  )No results found for this or any previous visit (from the past 240 hour(s)).       Radiology Studies: No results found.      Scheduled Meds:  citalopram  40 mg Oral q morning   omega-3 acid ethyl esters  2 capsule Oral BID   pantoprazole  40 mg Oral Daily   rosuvastatin  20 mg Oral QPM   Warfarin - Pharmacist Dosing Inpatient   Does not apply q1600   Continuous Infusions:     LOS: 0 days    Time spent: 35 minutes.    Berton Mount, MD  [facility] Pager #: [phone] 7PM-7AM contact night coverage as above

## 2023-02-15 NOTE — Progress Notes (Signed)
NUTRITION NOTE RD working remotely.  A 48 hour Calorie Count has been ordered for patient. Will communicate with RN via secure chat to bring awareness. Patient is currently ordered a Heart Healthy/Carb Modified diet with thin liquids. Full assessment and results from day #1 to follow on 02/16/23.    Trenton Gammon, MS, RD, LDN, CNSC Clinical Dietitian PRN/Relief staff On-call/weekend pager # available in [facility]

## 2023-02-16 ENCOUNTER — Ambulatory Visit: Payer: Worker's Compensation

## 2023-02-16 DIAGNOSIS — E11649 Type 2 diabetes mellitus with hypoglycemia without coma: Secondary | ICD-10-CM | POA: Diagnosis not present

## 2023-02-16 DIAGNOSIS — E162 Hypoglycemia, unspecified: Secondary | ICD-10-CM | POA: Diagnosis not present

## 2023-02-16 LAB — GLUCOSE, CAPILLARY
Glucose-Capillary: 131 mg/dL — ABNORMAL HIGH (ref 70–99)
Glucose-Capillary: 128 mg/dL — ABNORMAL HIGH (ref 70–99)
Glucose-Capillary: 178 mg/dL — ABNORMAL HIGH (ref 70–99)
Glucose-Capillary: 174 mg/dL — ABNORMAL HIGH (ref 70–99)

## 2023-02-16 LAB — PROTIME-INR
INR: 2.7 — ABNORMAL HIGH (ref 0.8–1.2)
Prothrombin Time: 28.8 seconds — ABNORMAL HIGH (ref 11.4–15.2)

## 2023-02-16 MED ORDER — METOPROLOL TARTRATE 25 MG PO TABS
25.0000 mg | ORAL_TABLET | Freq: Two times a day (BID) | ORAL | Status: DC
  Administered 2023-02-16: 25 mg via ORAL
  Filled 2023-02-16: qty 1

## 2023-02-16 MED ORDER — WARFARIN SODIUM 2.5 MG PO TABS
2.5000 mg | ORAL_TABLET | Freq: Once | ORAL | Status: DC
  Filled 2023-02-16: qty 1

## 2023-02-16 MED ORDER — SENNOSIDES-DOCUSATE SODIUM 8.6-50 MG PO TABS: 1.0000 | ORAL_TABLET | Freq: Every day | ORAL | 2 refills | Status: AC

## 2023-02-16 MED ORDER — SENNOSIDES-DOCUSATE SODIUM 8.6-50 MG PO TABS: 1.0000 | ORAL_TABLET | Freq: Every day | ORAL | Status: DC

## 2023-02-16 MED ORDER — ENSURE ENLIVE PO LIQD: 237.0000 mL | ORAL | Status: DC

## 2023-02-16 NOTE — Care Management Obs Status (Signed)
MEDICARE OBSERVATION STATUS NOTIFICATION   Patient Details  Name: Evan [name] MRN: [id number] Date of Birth: [birth year]   Medicare Observation Status Notification Given:  Yes    Tom-Johnson, Hershal Coria, RN 02/16/2023, 9:13 AM

## 2023-02-16 NOTE — Evaluation (Signed)
Physical Therapy Evaluation Patient Details Name: Evan [name] MRN: [id number] DOB: 06-04-1946 Today's Date: 02/16/2023  History of Present Illness  77 y.o. male presents to [facility] hospital on 02/14/2023 with hypoglycemia. PMH includes DMII, CKDIII, obesity, HTN, HLD, PE, GERD, anxiety/depression.  Clinical Impression  Pt presents to PT at or near his functional baseline. Pt is able to ambulate independently for household distances, no significant balance deviations noted. Pt reports he has been seeing outpatient SLP due to word finding deficits and that he would like to continue this, but he has completed his outpatient PT and OT since fall last July. Pt has no further PT needs at this time. Acute PT signing off.       Recommendations for follow up therapy are one component of a multi-disciplinary discharge planning process, led by the attending physician.  Recommendations may be updated based on patient status, additional functional criteria and insurance authorization.  Follow Up Recommendations       Assistance Recommended at Discharge None  Patient can return home with the following  Assist for transportation    Equipment Recommendations None recommended by PT  Recommendations for Other Services       Functional Status Assessment Patient has not had a recent decline in their functional status     Precautions / Restrictions Precautions Precautions: Fall Restrictions Weight Bearing Restrictions: No      Mobility  Bed Mobility                    Transfers Overall transfer level: Independent Equipment used: None                    Ambulation/Gait Ambulation/Gait assistance: Independent Gait Distance (Feet): 400 Feet Assistive device: None Gait Pattern/deviations: Step-through pattern Gait velocity: functional Gait velocity interpretation: >2.62 ft/sec, indicative of community ambulatory   General Gait Details: steady step-through gait  Stairs             Wheelchair Mobility    Modified Rankin (Stroke Patients Only)       Balance Overall balance assessment: Independent                                           Pertinent Vitals/Pain Pain Assessment Pain Assessment: No/denies pain    Home Living Family/patient expects to be discharged to:: Private residence Living Arrangements: Spouse/significant other Available Help at Discharge: Family;Available PRN/intermittently Type of Home: House Home Access: Stairs to enter;Ramped entrance Entrance Stairs-Rails: Right Entrance Stairs-Number of Steps: 2   Home Layout: One level Home Equipment: Agricultural consultant (2 wheels);Shower seat;Toilet riser;Adaptive equipment;Cane - quad      Prior Function Prior Level of Function : Independent/Modified Independent             Mobility Comments: has not driven since fall in July 2023       Hand Dominance        Extremity/Trunk Assessment   Upper Extremity Assessment Upper Extremity Assessment: Overall WFL for tasks assessed    Lower Extremity Assessment Lower Extremity Assessment: Overall WFL for tasks assessed    Cervical / Trunk Assessment Cervical / Trunk Assessment: Normal  Communication   Communication: Expressive difficulties (word finding deficits, seeing outpatient SLP)  Cognition Arousal/Alertness: Awake/alert Behavior During Therapy: WFL for tasks assessed/performed Overall Cognitive Status: History of cognitive impairments - at baseline  General Comments: cognitive deficits since fall july 2023        General Comments General comments (skin integrity, edema, etc.): VSS on RA    Exercises     Assessment/Plan    PT Assessment Patient does not need any further PT services  PT Problem List         PT Treatment Interventions      PT Goals (Current goals can be found in the Care Plan section)       Frequency        Co-evaluation               AM-PAC PT "6 Clicks" Mobility  Outcome Measure Help needed turning from your back to your side while in a flat bed without using bedrails?: None Help needed moving from lying on your back to sitting on the side of a flat bed without using bedrails?: None Help needed moving to and from a bed to a chair (including a wheelchair)?: None Help needed standing up from a chair using your arms (e.g., wheelchair or bedside chair)?: None Help needed to walk in hospital room?: None Help needed climbing 3-5 steps with a railing? : None 6 Click Score: 24    End of Session   Activity Tolerance: Patient tolerated treatment well Patient left: Other (comment) (standing at sink, brushing teeth) Nurse Communication: Mobility status PT Visit Diagnosis: Other symptoms and signs involving the nervous system (Z61.096)    Time: 0454-0981 PT Time Calculation (min) (ACUTE ONLY): 18 min   Charges:   PT Evaluation $PT Eval Low Complexity: 1 Low          Arlyss Gandy, PT, DPT Acute Rehabilitation Office [phone]   Arlyss Gandy 02/16/2023, 11:19 AM

## 2023-02-16 NOTE — Plan of Care (Signed)

## 2023-02-16 NOTE — Progress Notes (Addendum)
Initial Nutrition Assessment  DOCUMENTATION CODES:   Not applicable  INTERVENTION:  48 hour calorie count ordered per MD (results from day 1 to follow).  Recommend liberalizing diet to carbohydrate modified to allow pt to meet estimated needs from PO intake. Encouraged adequate intake of protein at meals. Could also consider further liberalization to regular diet.  Provide Ensure Enlive po once daily, each supplement provides 350 kcal and 20 grams of protein. Suspect with more liberalized diet pt may be able to meet estimated needs without ONS.  NUTRITION DIAGNOSIS:   Inadequate oral intake related to decreased appetite (PTA for 1-1.5 days due to diarrhea) as evidenced by per patient/family report.  GOAL:   Patient will meet greater than or equal to 90% of their needs  MONITOR:   PO intake, Supplement acceptance, Labs, Weight trends, I & O's  REASON FOR ASSESSMENT:   Consult Calorie Count  ASSESSMENT:   77 year old male with PMHx of DM type 2, CKD stage III, CAD s/p CABG, obesity, HTN, HLD, pulmonary embolism on Coumadin, GERD, chronic anxiety/depression admitted with hypoglycemia.  Pt previously on glyburide and metformin PTA. These medications were held during admission.  Met with pt at bedside. He reports decreased appetite for 1-1.5 days PTA in setting of diarrhea. He reports this has resolved and he had a formed BM yesterday. He reports his appetite is good now and is typically good at baseline. He reports eating 3 meals per day. For breakfast he has English muffin with fried egg. For lunch he has [place] sandwich with mayo. For dinner he has beef pot pie or a frozen meal. He denies food allergies or intolerances. Denies nausea, emesis, abdominal pain, or difficulty chewing/swallowing. Reports occasional reflux. Pt reports he has been eating well at meals and finishing 100%. He reports he could eat more than is being sent. MD ordered 48 hour calorie count. Documentation of  first 24 hours to follow. Suspect pt would be able to better meet estimated needs with less restrictive diet of carbohydrate modified.   Pt reports his weight may fluctuate by 4-5 lbs. He reports UBW is around 200 lbs. Noted wt of 93.9 kg on 01/11/23. Suspect admission wt of 95.3 kg of exactly 210 lbs may have been a stated/estimated wt and not truly measured.  Medications reviewed and include: pantoprazole, metoprolol, Lovaza 2 capsules BID, senna-docusate, warfarin  Labs reviewed: CBG 128-178. On 5/18 sodium 133, BUN 35, creatinine 1.89 HgbA1c 5.9 on 02/14/23  UOP: 3 x unmeasured UOP previous 24 hours  I/O: +952.3 mL since admission  Patient now with discharge order and plans to discharge today.  NUTRITION - FOCUSED PHYSICAL EXAM:  Flowsheet Row Most Recent Value  Orbital Region No depletion  Upper Arm Region Mild depletion  Thoracic and Lumbar Region No depletion  Buccal Region No depletion  Temple Region No depletion  Clavicle Bone Region No depletion  Clavicle and Acromion Bone Region No depletion  Scapular Bone Region Unable to assess  Dorsal Hand No depletion  Patellar Region Unable to assess  [pt already dressed in pants]  Anterior Thigh Region Unable to assess  [pt already dressed in pants]  Posterior Calf Region Unable to assess  [pt already dressed in pants]  Edema (RD Assessment) Unable to assess  Hair Reviewed  Eyes Reviewed  Mouth Reviewed  Skin Reviewed  Nails Reviewed  [able to assess fingernails]      Diet Order:   Diet Order  Diet general           Diet heart healthy/carb modified Room service appropriate? Yes; Fluid consistency: Thin  Diet effective now                  EDUCATION NEEDS:   Education needs have been addressed  Skin:  Skin Assessment: Reviewed RN Assessment  Last BM:  02/15/23 - large type 4  Height:   Ht Readings from Last 1 Encounters:  02/14/23 5\' 9"  (1.753 m)   Weight:   Wt Readings from Last 1  Encounters:  02/14/23 95.3 kg   Ideal Body Weight:  72.7 kg  BMI:  Body mass index is 31.01 kg/m.  Estimated Nutritional Needs:   Kcal:  1800-2000  Protein:  90-100 grams  Fluid:  1.8-2 L/day  Letta Median, MS, RD, LDN, CNSC Pager number available on Amion

## 2023-02-16 NOTE — Progress Notes (Addendum)
Calorie Count Note  48 hour calorie count ordered. Day 1 results:  Diet: Heart healthy/carbohydrate modified Supplements: N/A  Pt ate 100% of all ordered meals Lunch 5/19: 427 kcal, 15 grams of protein Dinner 5/19: 479 kcal, 27 grams of protein Breakfast 5/20: 545 kcal, 33 grams of protein Supplements: N/A  Total intake Day 1: 1451 kcal (81% of minimum estimated needs)  75 grams of protein (83% of minimum estimated needs)  Estimated Nutritional Needs:  Kcal:  1800-2000 Protein:  90-100 grams Fluid:  1.8-2 L/day  Nutrition Dx: Inadequate oral intake related to decreased appetite (PTA for 1-1.5 days due to diarrhea) as evidenced by per patient/family report.   Goal: Patient will meet greater than or equal to 90% of their needs   Intervention:  -48 hour calorie count ordered per MD. -Recommend liberalizing diet to carbohydrate modified to allow pt to meet estimated needs from PO intake as he reports he could have eaten more than sent. Encouraged adequate intake of protein at meals. Could also consider further liberalization to regular diet. -Provide Ensure Enlive po once daily, each supplement provides 350 kcal and 20 grams of protein. Suspect with more liberalized diet pt may be able to meet estimated needs without ONS.  Pt now with discharge order and plans to discharge home today.   Letta Median, MS, RD, LDN, CNSC Pager number available on Amion

## 2023-02-16 NOTE — Progress Notes (Signed)
ANTICOAGULATION CONSULT NOTE   Pharmacy Consult for warfarin Indication: hx pulmonary embolus  No Active Allergies   Patient Measurements: Height: 5\' 9"  (175.3 cm) Weight: 95.3 kg (210 lb) IBW/kg (Calculated) : 70.7  Vital Signs: Temp: 98.1 F (36.7 C) (05/20 0819) Temp Source: Oral (05/20 0819) BP: 151/84 (05/20 0819) Pulse Rate: 79 (05/20 0819)  Labs: Recent Labs    02/14/23 0117 02/14/23 0448 02/15/23 0147 02/16/23 0132  HGB 16.9 16.4  --   --   HCT 53.3* 51.1  --   --   PLT 187 176  --   --   LABPROT 57.6* 58.5* 45.0* 28.8*  INR 6.6* 6.7* 4.8* 2.7*  CREATININE 1.85* 1.89*  --   --   TROPONINIHS 10  --   --   --      Estimated Creatinine Clearance: 37.3 mL/min (A) (by C-G formula based on SCr of 1.89 mg/dL (H)).   Assessment: 77 yom on warfarin PTA for hx PE admitted with refractory hypoglycemia. INR on admission was supratherapeutic at 6.6.  Dose PTA 2.5 mg Monday, Tuesday, Wednesday and 5 mg other days  INR down to 2.7 today  Goal of Therapy:  INR 2-3 Monitor platelets by anticoagulation protocol: Yes   Plan:  Warfarin 2.5 mg po x 1 dose today  Daily INR  Thank you Okey Regal, PharmD 02/16/2023 8:48 AM

## 2023-02-16 NOTE — Discharge Summary (Signed)
Physician Discharge Summary  MD. HOOS WUJ:[id number] DOB: 1946-05-31 DOA: 02/14/2023  PCP: Emilio Aspen, MD  Admit date: 02/14/2023 Discharge date: 02/16/2023  Admitted From: Home Discharge disposition: home  Recommendations at discharge:  Your antidiabetic oral pills have been stopped because of low blood sugar episodes.  Continue monitor blood sugar at home and follow-up with primary care provider. Resume Coumadin as before I would encourage you to maintain a pillbox under the supervision of your family member to minimize the risk of accidental overdose of her medicines   Brief narrative: Evan [name] is a 77 y.o. male with PMH significant for DM2 on glyburide, metformin and Actos, HTN, HLD, CAD/CABG 1999, A-flutter, PE on Coumadin, bilateral carotid artery disease, CKD, malignant melanoma, GERD, chronic anxiety/depression, OSA not on CPAP.  Patient has memory impairment but self administers his medications 5/18, patient was brought to the ED from home for low blood sugar level of 23. Apparently, patient's blood sugar level was running low for about 24 hours at home.  EMS had multiple visits to the home and finally transported him to the ED after a low blood sugar reading of 23. Patient received oral and IV glucose during the transport. While in the ED, multiple glucose checks revealed rapidly returning hypoglycemia despite IV dextrose push and eating.  Patient was hence started on D5 drip and admitted to [facility]. Antidiabetics were held.  Subjective: Patient was seen and examined this morning.  Pleasant elderly Caucasian male.  Propped up in bed.  Not in distress.  No new symptoms.  Slow to respond but alert, awake, oriented x 3.  Lives at home with his wife. Chart reviewed In the last 24 hours, patient is afebrile, hemodynamically stable with blood pressure in 140s and 150s Serial blood sugar monitoring showed stable glucose level close to 150s  mostly  Assessment and plan: Refractory hypoglycemia  Type 2 diabetes mellitus Brought in for low persistent low blood sugar for 24 hours period.  Hypoglycemia persisted in the ED despite extra boost and hence admitted on IV dextrose drip. A1c 5.9 on 02/14/2023. PTA on glyburide, metformin and Actos.   Patient has memory impairment but self administers his medications.  It is unclear if patient was mistakenly overusing his meds or if his requirement is low because of low oral intake and progressive CKD.  Oral antidiabetics are currently on hold.  With dextrose drip, blood sugar level stabilized. 5/19, dextrose drip was discontinued.  Blood sugar level since then remained stable mostly less than 150s.  It is 178 this morning.  A1c seems tightly controlled at 5.9.  Safe A1c target for him probably should be <8.  I would not resume any of his antidiabetic meds at discharge.  Metformin would be be in absolute no anyway because of baseline elevated creatinine. I have discussed this plan with patient and he is agreeable.  To follow-up with his PCP for further reassessment and adjustment. Recent Labs  Lab 02/15/23 2008 02/16/23 0109 02/16/23 0441 02/16/23 0723 02/16/23 1106  GLUCAP 161* 131* 128* 178* 174*   CKD 3B Creatinine trend as below.  Remains close to at baseline. Recent Labs    04/11/22 0910 04/12/22 0237 01/11/23 1736 02/14/23 0117 02/14/23 0448  BUN 29* 24* 27* 36* 35*  CREATININE 1.71* 1.71* 1.84* 1.85* 1.89*   History of PE Supratherapeutic INR Was on long-term anticoagulation with Coumadin.  INR was initially elevated to 6.6 without active bleeding.  Coumadin was held.  INR gradually improving.  Target INR 2-3.  Coumadin resumed today.  Continue Coumadin as before. Recent Labs  Lab 02/14/23 0117 02/14/23 0448 02/15/23 0147 02/16/23 0132  INR 6.6* 6.7* 4.8* 2.7*    Essential hypertension PTA on Toprol 50 mg daily, valsartan 80 mg daily Blood pressure initially soft  and hence meds were held. Blood pressure gradually rising up.  Resumed metoprolol today.  Okay to resume valsartan post discharge.  History of A-fib PTA on Toprol and Coumadin. Resumed metoprolol today.  Coumadin plan as above  CAD/CABG HLD Continue Crestor, fenofibrate.  Coumadin plan as above   GERD Resume home Protonix.  Anxiety/depression Celexa 40 mg daily to continue  Constipation No BM in few days.  Continue daily Senokot and PRN MiraLAX  Obesity  Body mass index is 31.01 kg/m. Patient has been advised to make an attempt to improve diet and exercise patterns to aid in weight loss.  Impaired mobility PT eval obtained.  Goals of care   Code Status: Full Code   Wounds:  -    Discharge Exam:   Vitals:   02/15/23 1637 02/15/23 1948 02/16/23 0819 02/16/23 1040  BP: (!) 152/74 (!) 140/84 (!) 151/84 (!) 157/95  Pulse: 65 66 79 80  Resp: 17 18 16    Temp: 98.3 F (36.8 C) 97.7 F (36.5 C) 98.1 F (36.7 C)   TempSrc:  Oral Oral   SpO2: 100% 100% 98% 100%  Weight:      Height:        Body mass index is 31.01 kg/m.  General exam: Pleasant, elderly Caucasian male.  Not in physical distress Skin: No rashes, lesions or ulcers. HEENT: Atraumatic, normocephalic, no obvious bleeding Lungs: Clear to auscultation bilaterally CVS: Regular rate and rhythm, no murmur GI/Abd soft, nontender, nondistended, some present CNS: Alert, awake, oriented x 3.  Slow to respond Psychiatry: Mood appropriate Extremities: No pedal edema, no calf tenderness  Follow ups:    Follow-up Information     Emilio Aspen, MD Follow up.   Specialty: Internal Medicine Contact information: [address], [address] [place] [address] [phone]                 Discharge Instructions:   Discharge Instructions     Call MD for:  difficulty breathing, headache or visual disturbances   Complete by: As directed    Call MD for:  extreme fatigue   Complete  by: As directed    Call MD for:  hives   Complete by: As directed    Call MD for:  persistant dizziness or light-headedness   Complete by: As directed    Call MD for:  persistant nausea and vomiting   Complete by: As directed    Call MD for:  severe uncontrolled pain   Complete by: As directed    Call MD for:  temperature >100.4   Complete by: As directed    Diet general   Complete by: As directed    Discharge instructions   Complete by: As directed    Recommendations at discharge:   Your antidiabetic oral pills have been stopped because of low blood sugar episodes.  Continue monitor blood sugar at home and follow-up with primary care provider.  Resume Coumadin as before  I would encourage you to maintain a pillbox under the supervision of your family member to minimize the risk of accidental overdose of her medicines  General discharge instructions: Follow with Primary MD Emilio Aspen, MD in 7 days  Please request your PCP  to go over your hospital tests, procedures, radiology results at the follow up. Please get your medicines reviewed and adjusted.  Your PCP may decide to repeat certain labs or tests as needed. Do not drive, operate heavy machinery, perform activities at heights, swimming or participation in water activities or provide baby sitting services if your were admitted for syncope or siezures until you have seen by Primary MD or a Neurologist and advised to do so again. [place] Controlled Substance Reporting System database was reviewed. Do not drive, operate heavy machinery, perform activities at heights, swim, participate in water activities or provide baby-sitting services while on medications for pain, sleep and mood until your outpatient physician has reevaluated you and advised to do so again.  You are strongly recommended to comply with the dose, frequency and duration of prescribed medications. Activity: As tolerated with Full fall precautions use  walker/cane & assistance as needed Avoid using any recreational substances like cigarette, tobacco, alcohol, or non-prescribed drug. If you experience worsening of your admission symptoms, develop shortness of breath, life threatening emergency, suicidal or homicidal thoughts you must seek medical attention immediately by calling 911 or calling your MD immediately  if symptoms less severe. You must read complete instructions/literature along with all the possible adverse reactions/side effects for all the medicines you take and that have been prescribed to you. Take any new medicine only after you have completely understood and accepted all the possible adverse reactions/side effects.  Wear Seat belts while driving. You were cared for by a hospitalist during your hospital stay. If you have any questions about your discharge medications or the care you received while you were in the hospital after you are discharged, you can call the unit and ask to speak with the hospitalist or the covering physician. Once you are discharged, your primary care physician will handle any further medical issues. Please note that NO REFILLS for any discharge medications will be authorized once you are discharged, as it is imperative that you return to your primary care physician (or establish a relationship with a primary care physician if you do not have one).   Increase activity slowly   Complete by: As directed        Discharge Medications:   Allergies as of 02/16/2023   No Active Allergies      Medication List     STOP taking these medications    acetaminophen 325 MG tablet Commonly known as: TYLENOL   Anucort-HC 25 MG suppository Generic drug: hydrocortisone   fluticasone 0.05 % cream Commonly known as: CUTIVATE   glyBURIDE 5 MG tablet Commonly known as: DIABETA   metFORMIN 500 MG 24 hr tablet Commonly known as: GLUCOPHAGE-XR   ondansetron 4 MG tablet Commonly known as: ZOFRAN   pioglitazone  30 MG tablet Commonly known as: ACTOS       TAKE these medications    citalopram 40 MG tablet Commonly known as: CELEXA Take 40 mg by mouth every morning.   fenofibrate micronized 67 MG capsule Commonly known as: LOFIBRA TAKE 1 CAPSULE DAILY (CALL OFFICE TO SCHEDULE YEARLY APPOINTMENT WITH DR. Anne Fu FOR NOVEMBER 2023 BEFORE ANY MORE REFILLS [phone])   metoprolol succinate 50 MG 24 hr tablet Commonly known as: TOPROL-XL TAKE 1 TABLET DAILY WITH OR IMMEDIATELY FOLLOWING A MEAL   nitroGLYCERIN 0.4 MG SL tablet Commonly known as: NITROSTAT Place 0.4 mg under the tongue every 5 (five) minutes as needed for chest pain.   omega-3  acid ethyl esters 1 g capsule Commonly known as: LOVAZA Take 2 capsules (2 g total) by mouth 2 (two) times daily.   pantoprazole 40 MG tablet Commonly known as: PROTONIX Take 40 mg by mouth daily.   polyethylene glycol 17 g packet Commonly known as: MIRALAX / GLYCOLAX Take 17 g by mouth daily as needed for mild constipation.   rosuvastatin 20 MG tablet Commonly known as: CRESTOR Take 20 mg by mouth every evening.   senna-docusate 8.6-50 MG tablet Commonly known as: Senokot-S Take 1 tablet by mouth at bedtime.   valsartan 160 MG tablet Commonly known as: DIOVAN Take 80 mg by mouth every morning.   warfarin 5 MG tablet Commonly known as: COUMADIN Take 0.5-1 tablets (2.5-5 mg total) by mouth See admin instructions. As directed--1/2 tab ( 2.5 mg)  every Monday,Tuesday, and Wednesday; 1 tab  ( 5 mg)  all other days          The results of significant diagnostics from this hospitalization (including imaging, microbiology, ancillary and laboratory) are listed below for reference.    Procedures and Diagnostic Studies:   No results found.   Labs:   Basic Metabolic Panel: Recent Labs  Lab 02/14/23 0117 02/14/23 0448  NA 135 133*  K 4.6 5.1  CL 105 104  CO2 20* 22  GLUCOSE 141* 63*  BUN 36* 35*  CREATININE 1.85* 1.89*   CALCIUM 9.6 9.4   GFR Estimated Creatinine Clearance: 37.3 mL/min (A) (by C-G formula based on SCr of 1.89 mg/dL (H)). Liver Function Tests: Recent Labs  Lab 02/14/23 0117  AST 26  ALT 24  ALKPHOS 47  BILITOT 0.3  PROT 6.8  ALBUMIN 3.5   No results for input(s): "LIPASE", "AMYLASE" in the last 168 hours. No results for input(s): "AMMONIA" in the last 168 hours. Coagulation profile Recent Labs  Lab 02/14/23 0117 02/14/23 0448 02/15/23 0147 02/16/23 0132  INR 6.6* 6.7* 4.8* 2.7*    CBC: Recent Labs  Lab 02/14/23 0117 02/14/23 0448  WBC 7.0 8.0  NEUTROABS 5.1  --   HGB 16.9 16.4  HCT 53.3* 51.1  MCV 93.3 93.4  PLT 187 176   Cardiac Enzymes: No results for input(s): "CKTOTAL", "CKMB", "CKMBINDEX", "TROPONINI" in the last 168 hours. BNP: Invalid input(s): "POCBNP" CBG: Recent Labs  Lab 02/15/23 2008 02/16/23 0109 02/16/23 0441 02/16/23 0723 02/16/23 1106  GLUCAP 161* 131* 128* 178* 174*   D-Dimer No results for input(s): "DDIMER" in the last 72 hours. Hgb A1c Recent Labs    02/14/23 0448  HGBA1C 5.9*   Lipid Profile No results for input(s): "CHOL", "HDL", "LDLCALC", "TRIG", "CHOLHDL", "LDLDIRECT" in the last 72 hours. Thyroid function studies No results for input(s): "TSH", "T4TOTAL", "T3FREE", "THYROIDAB" in the last 72 hours.  Invalid input(s): "FREET3" Anemia work up No results for input(s): "VITAMINB12", "FOLATE", "FERRITIN", "TIBC", "IRON", "RETICCTPCT" in the last 72 hours. Microbiology No results found for this or any previous visit (from the past 240 hour(s)).  Time coordinating discharge: 45 minutes  Signed: Nekisha Mcdiarmid  [facility] 02/16/2023, 5:38 PM

## 2023-02-16 NOTE — TOC Transition Note (Signed)
Transition of Care [facility]) - CM/SW Discharge Note   Patient Details  Name: Evan [name] MRN: [id number] Date of Birth: Apr 19, 1946  Transition of [facility]) CM/SW Contact:  Tom-Johnson, Hershal Coria, RN Phone Number: 02/16/2023, 12:21 PM   Clinical Narrative:     Patient is scheduled for discharge today.  Outpatient f/u, hospital f/u and discharge instructions on AVS.  No PT f/u noted. Wife, [name] to transport at discharge.  No further TOC needs noted.        Final next level of care: OP Rehab (ST) Barriers to Discharge: Barriers Resolved   Patient Goals and CMS Choice CMS Medicare.gov Compare Post Acute Care list provided to:: Patient    Discharge Placement                  Patient to be transferred to facility by: Wife Name of family member notified: [facility] Additional resources added to the After Visit Summary for                  DME Arranged: N/A DME Agency: NA       HH Arranged: NA HH Agency: NA        Social Determinants of Health (SDOH) Interventions SDOH Screenings   Food Insecurity: Patient Declined (02/14/2023)  Housing: Patient Declined (02/14/2023)  Transportation Needs: Patient Declined (02/14/2023)  Utilities: Patient Declined (02/14/2023)  Tobacco Use: Low Risk  (02/14/2023)     Readmission Risk Interventions     No data to display

## 2023-02-18 ENCOUNTER — Ambulatory Visit: Payer: Worker's Compensation

## 2023-02-18 DIAGNOSIS — I483 Typical atrial flutter: Secondary | ICD-10-CM | POA: Diagnosis not present

## 2023-02-18 DIAGNOSIS — E291 Testicular hypofunction: Secondary | ICD-10-CM | POA: Diagnosis not present

## 2023-02-18 DIAGNOSIS — E1122 Type 2 diabetes mellitus with diabetic chronic kidney disease: Secondary | ICD-10-CM | POA: Diagnosis not present

## 2023-02-18 DIAGNOSIS — D6869 Other thrombophilia: Secondary | ICD-10-CM | POA: Diagnosis not present

## 2023-02-18 DIAGNOSIS — I25118 Atherosclerotic heart disease of native coronary artery with other forms of angina pectoris: Secondary | ICD-10-CM | POA: Diagnosis not present

## 2023-02-18 DIAGNOSIS — F331 Major depressive disorder, recurrent, moderate: Secondary | ICD-10-CM | POA: Diagnosis not present

## 2023-02-18 DIAGNOSIS — I77810 Thoracic aortic ectasia: Secondary | ICD-10-CM | POA: Diagnosis not present

## 2023-02-18 DIAGNOSIS — E1142 Type 2 diabetes mellitus with diabetic polyneuropathy: Secondary | ICD-10-CM | POA: Diagnosis not present

## 2023-02-18 DIAGNOSIS — R41841 Cognitive communication deficit: Secondary | ICD-10-CM | POA: Diagnosis not present

## 2023-02-18 DIAGNOSIS — N1832 Chronic kidney disease, stage 3b: Secondary | ICD-10-CM | POA: Diagnosis not present

## 2023-02-18 NOTE — Therapy (Signed)
OUTPATIENT SPEECH LANGUAGE PATHOLOGY TREATMENT   Patient Name: Evan [name] MRN: [id number] DOB:08-04-1946, 77 y.o., male Today's Date: 02/18/2023  PCP: Marden Noble MD REFERRING PROVIDER: Tia Alert MD  END OF SESSION:  End of Session - 02/18/23 1100     Visit Number 22    Number of Visits 25    Date for SLP Re-Evaluation 03/04/23    Authorization Type [employer]    Authorization Time Period 12 ST addl visits apprd - 12/23/21    Authorization - Visit Number 9    Authorization - Number of Visits 12    SLP Start Time 0932    SLP Stop Time  1014    SLP Time Calculation (min) 42 min    Activity Tolerance Patient tolerated treatment well                Past Medical History:  Diagnosis Date   Acquired dilation of ascending aorta and aortic root (HCC)    a.) measurements by TTE in 04/2021 --> root 40 mm and ascending aorta 43 mm   Anginal pain (HCC)    Anxiety    Atrial flutter (HCC) 04/22/2021   a.) CHADS2VASc = 5 (age x 2, HTN, T2DM, CAD); b.) daily warfarin   Carotid disease, bilateral (HCC)    a.) Doppler in 2019 --> 1-39% stenosis on RIGHT and 40-58% on LEFT   Cataracts, bilateral    Chronic anticoagulation    Warfarin   Chronic renal insufficiency    Coronary artery disease    a.) s/p 3v CABG in 1999   Depression    GERD (gastroesophageal reflux disease)    History of kidney stones    Hypercholesteremia    Hypertension    Kidney stone    Low testosterone    on TRT injections   Malignant melanoma (HCC)    a.) face, nose, left leg; b.) Tx'd with chemotherapy + XRT at [facility]   Myocardial infarction [facility])    Pulmonary emboli (HCC)    S/P CABG x 3 1999   a.) LIMA-LAD, SVG-RCA, SVG-diagnonal   Sleep apnea    not using nocturnal PAP therapy   T2DM (type 2 diabetes mellitus) (HCC)    Past Surgical History:  Procedure Laterality Date   CARDIAC CATHETERIZATION     x3     last 1999   CARDIOVERSION N/A 08/12/2021   Procedure:  CARDIOVERSION;  Surgeon: Pricilla Riffle, MD;  Location: [facility] ENDOSCOPY;  Service: Cardiovascular;  Laterality: N/A;   CATARACT EXTRACTION Bilateral    COLONOSCOPY WITH PROPOFOL N/A 09/26/2014   Procedure: COLONOSCOPY WITH PROPOFOL;  Surgeon: Charolett Bumpers, MD;  Location: [facility] ENDOSCOPY;  Service: Endoscopy;  Laterality: N/A;   CORONARY ARTERY BYPASS GRAFT N/A 1999   3v; LIMA-LAD, SVG-RCA, SVG-diagonal   HOLEP-LASER ENUCLEATION OF THE PROSTATE WITH MORCELLATION N/A 06/07/2021   Procedure: HOLEP-LASER ENUCLEATION OF THE PROSTATE WITH MORCELLATION;  Surgeon: Sondra Come, MD;  Location: [facility] ORS;  Service: Urology;  Laterality: N/A;   KNEE ARTHROSCOPY Left    LAPAROSCOPIC CHOLECYSTECTOMY     MASS EXCISION  04/15/2012   Procedure: EXCISION MASS;  Surgeon: Suzanna Obey, MD;  Location: [facility] OR;  Service: ENT;  Laterality: Left;  Left tonsil biopsy   melanomia     several skin ca removed-nose, left ankle, parotid gland left, right nodes-being followed annually..   Patient Active Problem List   Diagnosis Date Noted   Hypoglycemia 02/14/2023   SDH (subdural hematoma) (  HCC) 04/11/2022   Fall (on)(from) sidewalk curb, initial encounter 04/11/2022   History of melanoma excision 04/11/2022   Diabetes mellitus type 2 in obese 04/11/2022   DNR (do not resuscitate) 04/11/2022   Chronic anticoagulation 06/30/2021   Typical atrial flutter (HCC) 06/28/2021   Pure hypercholesterolemia 12/23/2013   Carotid artery disease (HCC) 12/23/2013   Essential hypertension, benign 12/23/2013   Angina decubitus 12/23/2013   Coronary artery disease    Cancer (HCC)    Depression    Pulmonary emboli (HCC)    Hypercholesteremia    Kidney stone    Chronic renal insufficiency     ONSET DATE: 04/11/2022 (referral=11/06/22)  REFERRING DIAG: S06.5XAA- Traumatic subdural hemorrhage with loss of consciousness   THERAPY DIAG: Cognitive communication deficit  Rationale for Evaluation and Treatment:  Rehabilitation  SUBJECTIVE:   SUBJECTIVE STATEMENT: "I spent some time in the hospital" Pt accompanied by: self  PERTINENT HISTORY: 77 y.o. male presents to [facility] hospital on 7/14 after falling. Pt found to have small parafalcine SDH and right tentorial SDH , reporting nausea, HA, and dizziness. PMH:  includes anxiety, aflutter, depression, HTN, PE, CABG, DMII.   PAIN: Are you having pain? No  FALLS: Has patient fallen in last 6 months?  Yes, Number of falls: 3-4 (tripping)  PATIENT GOALS: "I want to be able to figure things out on my own. I want to confidently talk with people and not feel like I have to run and hide "   OBJECTIVE:   TODAY'S TREATMENT:                                                                                                                                         02-18-23: Prompted discussion re: recent hospitalization with intermittent min-mod A from wife required for word retrieval and recall. Benefited from extra processing time. Wife reported pt with increased fatigue when completing household tasks (MD planning to refer for PT). Engaged patient in discussion re: safety awareness and problem solving to ID current cognitive/physical limitations and pre-planning to account for deficits. Suggested discussing his plan aloud with his wife prior to initiating task. Provided education re: using visual aids to optimize attention and recall (ex: don hearing aids) as pt needs usual reminders at home. Continued to recommend wife assistance with medication and bills d/t reduced error awareness, inattention, and recall.   5-17-24[name] attended session today. Pt accurately recalled MD name from last session, indicating success of Spaced Retrieval therapy technique. Again, re-iterated ability to use this intervention with impaired recall of other functional information. Pt able to appropriately locate memory "cheat sheet" but no new additions included. [name] endorsed now assisting with  medications d/t MD questioning medication management at last appointment. Re-educated previous SLP recommendation for wife assisting with medicines d/t pt exhibiting reduced error awareness and attention/memory deficits. Suggested scaffolding assistance during functional tasks at home, including supervision first and  increasing wife assistance as needed. Current medication and financial systems to be re-assessed this weekend with wife supervision, given pt has been managing independently with visual aids. Plan to target some online bill pay and financial management scenarios next session.   02-11-23: Attempted to retell recent story related to recalling medical information but unable to recall MD name. Did successfully request written information to aid processing and recall as recommended. Cued use of visual memory support, which was effective. Utilized Office manager Therapy to target recall of MD name, in which pt able to recall targeted response up to 16 minute interval indicating increased ability to retain targeted information. Unable to recall relevant family information (grandchildren names); therefore, SLP prompted use of visual support (family tree) to aid recall. SLP prompted sentence completion was helpful to recall one granddaughter. Continued to recommend writing down relevant information as needed.   02-06-23: Wife attended session today. Provided education of clinical observations and targeted ST interventions related to ongoing cognitive and communication challenges. Conducted caregiver education re: recommended techniques to optimize recall/attention in conversation (writing notes, internal rehearsal) as well as word finding (cueing hierarchy) to reduce vague speech. Wife verbalized understanding and appreciation for recommendations. Today, targeted use of note writing to aid attention and recall in conversation. Pt able to completed with some intermittent mod A to focus on key words versus  sentences.   02-04-23: Continues to come alone despite recommendation for wife to attend ST. No additional information added to "memory cheat sheet" other than PCP name from last session. Dicussed recent PCP appointment with reduced recall noted. Addressed mentioned concerns pertinent to ST, including memory loss (I.e., forgetting medications) and overall reduced awareness. Educated and instructed pertinent cognitive compensations and recommendations to aid completion of iADLs (recommending wife supervision and assistance with meds) and recall of functional information. Targeted writing down information given auditory stimuli (voicemails), with frequent repetition required to aid accuracy of recall. Recertification completed for remaining visits with LTGs downgraded d/t persistent cognitive challenges despite SLP intervention, which may be indicative of underlying memory/cognitive impairment.   01-28-23: Errors noted on homework despite explicit written instructions to optimize descriptions for word retrieval. Pt continues to use one word or vague response to describe item despite repetitive instruction and practice. Modeling and re-instruction was ineffective so provided alternate strategy for synonyms for word retrieval. Occasional mod A required to ID appropriate synonyms on structured task. Endorsed difficulty recalling name of new PCP. SLP generated written aid "memory cheat sheet" to write down pertinent information that pt has difficulty consistently recalling. Reported good carryover of to-do lists and pre-planning with wife as previously instructed. Suggested wife participate in therapy sessions to aid carryover and provide caregiver education.    01-26-23: Reviewed homework to use description strategy for word retrieval, with noted difficulty understanding and implementing compensation. SLP re-educated rationale and structured techniques to optimize describing ability (SFA, written prompts, modeling) to  aid word finding. Benefited from usual fading to occasional modeling and verbal cues to optimize specificity of descriptions. Usual extra processing time was beneficial for word retrieval and comprehension.   01-21-23: Endorsed difficulty alternating attention to transfer information between printed calendar and personal home calendar. Initial max A required to orient to today's date; recommended use of phone to aid orientation. Good carryover of phone exhibited throughout session when cued. Targeted transferring information from one written calendar to another with usual fading to occasional max A required to complete task. Overt errors noted x3 which pt did not ID even  with cued double check. Given ongoing overt difficulty managing calendar due to impaired attention, recall, and awareness, recommended increased assistance from wife as pt unable to carryover trained techniques independently. Reported some use of trained description strategy to aid anomia with less frustration reported. Reviewed descriptions written for homework with usual max cues required to increase specificity. Updated HEP to optimize descriptions for additional practice.  01-12-23: Returned after nearly 1 month d/t awaiting auth for additional ST visits. In verbal recap of last month, pt reported he "flunked" driving test which resulted in revoking of driver's license. Also reported missing several hearing aid appointments due to life situations. Endorsed good carryover of calendar and memory supports to aid recall, with success indicated. Cued recall of recent ED event, in which pt endorsed possible psychosis. Some difficulty with thought organization and verbal expression noted today in conversation. Conversational speech c/b intermittent vague language and occasional anomia. Pt stated "there are some words I can't get out." Targeted word retrieval strategies and SFA this session to optimize communication effectiveness. Usual mod A required  to aid comprehension and carryover.   12-19-22: Able to demo improved good problem solving, in which pt identified appropriate hearing clinic with appointment scheduled. Cued patient to write appointment on calendar. Min A required to ID correct date (19th vs 29th). Targeted problem solving to determine if this clinic accepts his insurance. Prompted using computer to investigate. Intermittent min cues required to maintain attention to targeted subject. Educated and instructed attention/processing/comprehension strategies to aid patient participation in conversation. Identified strategies x4 to implement at home with wife to repair communication breakdown. Due to time constraints, word finding strategies to be educated and trained next session.    12-17-22: Pt has demonstrated increased ability to manage medications more effectively with revision of updated personal medication list and double checking per SLP recommendations. Reported progress with memory but endorsed difficulty in conversation, particularly over the phone. Deduced difficulty stems from hearing impairment and auditory processing/comprehension. Targeted functional problem solving to address hearing impairment with use of visual aid. With usual prompting to deduce possible solutions, pt able to ID best solution (see audiologist). Pt able to sequence appropriate next steps with usual min A. SLP initiated education and instruction of attention/auditory comprehension strategies to aid attention, recall, and understanding in conversation. Pt would benefit from additional training to aid receptive and expressive language as pt noted with intermittent difficulty with word retrieval and answering SLP questions in conversation today.   PATIENT EDUCATION: Education details: see above Person educated: Patient Education method: Programmer, multimedia, Demonstration, Verbal cues, and Handouts Education comprehension: verbalized understanding, returned demonstration,  and needs further education   GOALS: Goals reviewed with patient? Yes  SHORT TERM GOALS: Target date: 12/05/2022  Complete CLQT and PROM first 1-2 sessions with thorough discussion of results to heighten pt awareness  Baseline:  Goal status: MET  2.  Pt will follow multi-step instructions to complete functional task with ~90% accuracy given occasional min A for 2/2 opportunities  Baseline: 11-28-22 Goal status: PARTIALLY MET  3.  Pt will demonstrate increased attention to detail on structured iADL tasks with 3 or less errors exhibited given occasional min A  Baseline: 12/05/22 Goal status: PARTIALLY MET  4.  Pt will demonstrate increased safety awareness by verbalizing problem and solution prior to initiating task for 2/2 opportunities  Baseline: 11-19-22 Goal status: PARTIALLY MET  5.   Pt will utilize processing/attention strategies in short structured conversations to complete entirety of thought for 80% of messages  given occasional min A  Baseline: 11-19-22, 11-21-22 Goal status: MET  LONG TERM GOALS: Target date: 03/04/23 (DOWNGRADED at [facility])  Pt will successfully manage iADLs (medications, finances, appointments) with use of memory aids and compensations given supervision from wife Baseline:  Goal status: IN PROGRESS   2.  Pt will demonstrate improved safety awareness and attention to detail with less frequent A from wife to ID safety concerns and complete functional tasks given supervision from wife Baseline:  Goal status: IN PROGRESS   3.  Pt will utilize processing/attention strategies in extended unstructured conversations to complete entirety of thoughts for ~90% of messages given intermittent min A Baseline:  Goal status: IN PROGRESS   4.  Pt will demonstrate [facility] verbal expression with ability to compensate for anomia/dysnomia with trained strategy given occasional min A in 30+ minute conversations  Baseline:  Goal status: IN PROGRESS   ASSESSMENT:  CLINICAL  IMPRESSION: Patient is a 77 y.o. male who was seen today for cognitive linguistic rehabilitation following SDH from fall in July 2023. Continues to present with ongoing difficulty with cognition (memory, attention, executive functioning) and communication (expressive and receptive language) impacting his ability to independently manage his affairs and converse effectively, despite repetitive instruction and training of recommended techniques. Conducting ongoing  education and training with wife to aid cognitive functioning and carryover at home. Given functional impact and increased frustration for patient and wife, pt would benefit from skilled ST intervention to optimize cognitive functioning to maximize return to prior baseline.   OBJECTIVE IMPAIRMENTS: include attention, memory, awareness, and executive functioning. These impairments are limiting patient from managing medications, managing finances, and household responsibilities. Factors affecting potential to achieve goals and functional outcome are ability to learn/carryover information. Patient will benefit from skilled SLP services to address above impairments and improve overall function.  REHAB POTENTIAL: Good  PLAN:  SLP FREQUENCY: 2x/week  SLP DURATION: 12 weeks  PLANNED INTERVENTIONS: Language facilitation, Environmental controls, Cueing hierachy, Cognitive reorganization, Internal/external aids, Functional tasks, Multimodal communication approach, SLP instruction and feedback, Compensatory strategies, and Patient/family education    Gracy Racer, CCC-SLP 02/18/2023, 11:02 AM

## 2023-02-25 ENCOUNTER — Ambulatory Visit: Payer: Medicare PPO | Attending: Internal Medicine

## 2023-02-25 ENCOUNTER — Telehealth: Payer: Self-pay | Admitting: Cardiology

## 2023-02-25 DIAGNOSIS — R41841 Cognitive communication deficit: Secondary | ICD-10-CM | POA: Insufficient documentation

## 2023-02-25 NOTE — Telephone Encounter (Signed)
Pt is scheduled for f/u with Dr Anne Fu 03/31/23.  Overdue and therefore fenofibrate has not been refilled recently.  No recent lipid profile noted in chart.  Will have Dr Anne Fu to review if pt should re-start or wait until he is seen in July.

## 2023-02-25 NOTE — Telephone Encounter (Signed)
Pt c/o medication issue:  1. Name of Medication:   fenofibrate micronized (LOFIBRA) 67 MG capsule   2. How are you currently taking this medication (dosage and times per day)?   Had been taking 1 capsule daily  3. Are you having a reaction (difficulty breathing--STAT)?     4. What is your medication issue?   Wife called to check if patient needs to still take this medication as patient is now completely out and has been out for at least a couple of months.

## 2023-02-25 NOTE — Therapy (Signed)
OUTPATIENT SPEECH LANGUAGE PATHOLOGY TREATMENT   Patient Name: Evan [name] MRN: [id number] DOB:06-May-1946, 77 y.o., male Today's Date: 02/25/2023  PCP: Marden Noble MD REFERRING PROVIDER: Tia Alert MD  END OF SESSION:  End of Session - 02/25/23 1033     Visit Number 23    Number of Visits 25    Date for SLP Re-Evaluation 03/04/23    Authorization Type [employer]    Authorization Time Period 12 ST addl visits apprd - 12/23/21    Authorization - Visit Number 10    Authorization - Number of Visits 12    SLP Start Time 0930    SLP Stop Time  1015    SLP Time Calculation (min) 45 min    Activity Tolerance Patient tolerated treatment well                 Past Medical History:  Diagnosis Date   Acquired dilation of ascending aorta and aortic root (HCC)    a.) measurements by TTE in 04/2021 --> root 40 mm and ascending aorta 43 mm   Anginal pain (HCC)    Anxiety    Atrial flutter (HCC) 04/22/2021   a.) CHADS2VASc = 5 (age x 2, HTN, T2DM, CAD); b.) daily warfarin   Carotid disease, bilateral (HCC)    a.) Doppler in 2019 --> 1-39% stenosis on RIGHT and 40-58% on LEFT   Cataracts, bilateral    Chronic anticoagulation    Warfarin   Chronic renal insufficiency    Coronary artery disease    a.) s/p 3v CABG in 1999   Depression    GERD (gastroesophageal reflux disease)    History of kidney stones    Hypercholesteremia    Hypertension    Kidney stone    Low testosterone    on TRT injections   Malignant melanoma (HCC)    a.) face, nose, left leg; b.) Tx'd with chemotherapy + XRT at [facility]   Myocardial infarction [facility])    Pulmonary emboli (HCC)    S/P CABG x 3 1999   a.) LIMA-LAD, SVG-RCA, SVG-diagnonal   Sleep apnea    not using nocturnal PAP therapy   T2DM (type 2 diabetes mellitus) (HCC)    Past Surgical History:  Procedure Laterality Date   CARDIAC CATHETERIZATION     x3     last 1999   CARDIOVERSION N/A 08/12/2021    Procedure: CARDIOVERSION;  Surgeon: Pricilla Riffle, MD;  Location: [facility] ENDOSCOPY;  Service: Cardiovascular;  Laterality: N/A;   CATARACT EXTRACTION Bilateral    COLONOSCOPY WITH PROPOFOL N/A 09/26/2014   Procedure: COLONOSCOPY WITH PROPOFOL;  Surgeon: Charolett Bumpers, MD;  Location: [facility] ENDOSCOPY;  Service: Endoscopy;  Laterality: N/A;   CORONARY ARTERY BYPASS GRAFT N/A 1999   3v; LIMA-LAD, SVG-RCA, SVG-diagonal   HOLEP-LASER ENUCLEATION OF THE PROSTATE WITH MORCELLATION N/A 06/07/2021   Procedure: HOLEP-LASER ENUCLEATION OF THE PROSTATE WITH MORCELLATION;  Surgeon: Sondra Come, MD;  Location: [facility] ORS;  Service: Urology;  Laterality: N/A;   KNEE ARTHROSCOPY Left    LAPAROSCOPIC CHOLECYSTECTOMY     MASS EXCISION  04/15/2012   Procedure: EXCISION MASS;  Surgeon: Suzanna Obey, MD;  Location: [facility] OR;  Service: ENT;  Laterality: Left;  Left tonsil biopsy   melanomia     several skin ca removed-nose, left ankle, parotid gland left, right nodes-being followed annually..   Patient Active Problem List   Diagnosis Date Noted   Hypoglycemia 02/14/2023   SDH (subdural  hematoma) (HCC) 04/11/2022   Fall (on)(from) sidewalk curb, initial encounter 04/11/2022   History of melanoma excision 04/11/2022   Diabetes mellitus type 2 in obese 04/11/2022   DNR (do not resuscitate) 04/11/2022   Chronic anticoagulation 06/30/2021   Typical atrial flutter (HCC) 06/28/2021   Pure hypercholesterolemia 12/23/2013   Carotid artery disease (HCC) 12/23/2013   Essential hypertension, benign 12/23/2013   Angina decubitus 12/23/2013   Coronary artery disease    Cancer (HCC)    Depression    Pulmonary emboli (HCC)    Hypercholesteremia    Kidney stone    Chronic renal insufficiency     ONSET DATE: 04/11/2022 (referral=11/06/22)  REFERRING DIAG: S06.5XAA- Traumatic subdural hemorrhage with loss of consciousness   THERAPY DIAG: Cognitive communication deficit  Rationale for Evaluation and Treatment:  Rehabilitation  SUBJECTIVE:   SUBJECTIVE STATEMENT: "what day is it?" Pt accompanied by: self  PERTINENT HISTORY: 77 y.o. male presents to [facility] hospital on 7/14 after falling. Pt found to have small parafalcine SDH and right tentorial SDH , reporting nausea, HA, and dizziness. PMH:  includes anxiety, aflutter, depression, HTN, PE, CABG, DMII.   PAIN: Are you having pain? No  FALLS: Has patient fallen in last 6 months?  Yes, Number of falls: 3-4 (tripping)  PATIENT GOALS: "I want to be able to figure things out on my own. I want to confidently talk with people and not feel like I have to run and hide "   OBJECTIVE:   TODAY'S TREATMENT:                                                                                                                                         02-25-23: Educated patient and wife on recommendations to optimize attention, recall, and verbal expression for functional tasks at home. Wife reported recent improvements in communication. Continue to recommend use of external aids to manage to-do lists and written reminders, with organization management provided today. Provided modeling and cues for wife to assist patient at home. See patient instructions for more details.   02-18-23: Prompted discussion re: recent hospitalization with intermittent min-mod A from wife required for word retrieval and recall. Benefited from extra processing time. Wife reported pt with increased fatigue when completing household tasks (MD planning to refer for PT). Engaged patient in discussion re: safety awareness and problem solving to ID current cognitive/physical limitations and pre-planning to account for deficits. Suggested discussing his plan aloud with his wife prior to initiating task. Provided education re: using visual aids to optimize attention and recall (ex: don hearing aids) as pt needs usual reminders at home. Continued to recommend wife assistance with medication and bills d/t reduced  error awareness, inattention, and recall.   5-17-24[name] attended session today. Pt accurately recalled MD name from last session, indicating success of Spaced Retrieval therapy technique. Again, re-iterated ability to use this intervention with impaired recall  of other functional information. Pt able to appropriately locate memory "cheat sheet" but no new additions included. [name] endorsed now assisting with medications d/t MD questioning medication management at last appointment. Re-educated previous SLP recommendation for wife assisting with medicines d/t pt exhibiting reduced error awareness and attention/memory deficits. Suggested scaffolding assistance during functional tasks at home, including supervision first and increasing wife assistance as needed. Current medication and financial systems to be re-assessed this weekend with wife supervision, given pt has been managing independently with visual aids. Plan to target some online bill pay and financial management scenarios next session.   02-11-23: Attempted to retell recent story related to recalling medical information but unable to recall MD name. Did successfully request written information to aid processing and recall as recommended. Cued use of visual memory support, which was effective. Utilized Office manager Therapy to target recall of MD name, in which pt able to recall targeted response up to 16 minute interval indicating increased ability to retain targeted information. Unable to recall relevant family information (grandchildren names); therefore, SLP prompted use of visual support (family tree) to aid recall. SLP prompted sentence completion was helpful to recall one granddaughter. Continued to recommend writing down relevant information as needed.   02-06-23: Wife attended session today. Provided education of clinical observations and targeted ST interventions related to ongoing cognitive and communication challenges. Conducted caregiver  education re: recommended techniques to optimize recall/attention in conversation (writing notes, internal rehearsal) as well as word finding (cueing hierarchy) to reduce vague speech. Wife verbalized understanding and appreciation for recommendations. Today, targeted use of note writing to aid attention and recall in conversation. Pt able to completed with some intermittent mod A to focus on key words versus sentences.   02-04-23: Continues to come alone despite recommendation for wife to attend ST. No additional information added to "memory cheat sheet" other than PCP name from last session. Dicussed recent PCP appointment with reduced recall noted. Addressed mentioned concerns pertinent to ST, including memory loss (I.e., forgetting medications) and overall reduced awareness. Educated and instructed pertinent cognitive compensations and recommendations to aid completion of iADLs (recommending wife supervision and assistance with meds) and recall of functional information. Targeted writing down information given auditory stimuli (voicemails), with frequent repetition required to aid accuracy of recall. Recertification completed for remaining visits with LTGs downgraded d/t persistent cognitive challenges despite SLP intervention, which may be indicative of underlying memory/cognitive impairment.   01-28-23: Errors noted on homework despite explicit written instructions to optimize descriptions for word retrieval. Pt continues to use one word or vague response to describe item despite repetitive instruction and practice. Modeling and re-instruction was ineffective so provided alternate strategy for synonyms for word retrieval. Occasional mod A required to ID appropriate synonyms on structured task. Endorsed difficulty recalling name of new PCP. SLP generated written aid "memory cheat sheet" to write down pertinent information that pt has difficulty consistently recalling. Reported good carryover of to-do lists and  pre-planning with wife as previously instructed. Suggested wife participate in therapy sessions to aid carryover and provide caregiver education.    01-26-23: Reviewed homework to use description strategy for word retrieval, with noted difficulty understanding and implementing compensation. SLP re-educated rationale and structured techniques to optimize describing ability (SFA, written prompts, modeling) to aid word finding. Benefited from usual fading to occasional modeling and verbal cues to optimize specificity of descriptions. Usual extra processing time was beneficial for word retrieval and comprehension.   01-21-23: Endorsed difficulty alternating attention to transfer information between  printed calendar and personal home calendar. Initial max A required to orient to today's date; recommended use of phone to aid orientation. Good carryover of phone exhibited throughout session when cued. Targeted transferring information from one written calendar to another with usual fading to occasional max A required to complete task. Overt errors noted x3 which pt did not ID even with cued double check. Given ongoing overt difficulty managing calendar due to impaired attention, recall, and awareness, recommended increased assistance from wife as pt unable to carryover trained techniques independently. Reported some use of trained description strategy to aid anomia with less frustration reported. Reviewed descriptions written for homework with usual max cues required to increase specificity. Updated HEP to optimize descriptions for additional practice.  01-12-23: Returned after nearly 1 month d/t awaiting auth for additional ST visits. In verbal recap of last month, pt reported he "flunked" driving test which resulted in revoking of driver's license. Also reported missing several hearing aid appointments due to life situations. Endorsed good carryover of calendar and memory supports to aid recall, with success  indicated. Cued recall of recent ED event, in which pt endorsed possible psychosis. Some difficulty with thought organization and verbal expression noted today in conversation. Conversational speech c/b intermittent vague language and occasional anomia. Pt stated "there are some words I can't get out." Targeted word retrieval strategies and SFA this session to optimize communication effectiveness. Usual mod A required to aid comprehension and carryover.   12-19-22: Able to demo improved good problem solving, in which pt identified appropriate hearing clinic with appointment scheduled. Cued patient to write appointment on calendar. Min A required to ID correct date (19th vs 29th). Targeted problem solving to determine if this clinic accepts his insurance. Prompted using computer to investigate. Intermittent min cues required to maintain attention to targeted subject. Educated and instructed attention/processing/comprehension strategies to aid patient participation in conversation. Identified strategies x4 to implement at home with wife to repair communication breakdown. Due to time constraints, word finding strategies to be educated and trained next session.    12-17-22: Pt has demonstrated increased ability to manage medications more effectively with revision of updated personal medication list and double checking per SLP recommendations. Reported progress with memory but endorsed difficulty in conversation, particularly over the phone. Deduced difficulty stems from hearing impairment and auditory processing/comprehension. Targeted functional problem solving to address hearing impairment with use of visual aid. With usual prompting to deduce possible solutions, pt able to ID best solution (see audiologist). Pt able to sequence appropriate next steps with usual min A. SLP initiated education and instruction of attention/auditory comprehension strategies to aid attention, recall, and understanding in conversation.  Pt would benefit from additional training to aid receptive and expressive language as pt noted with intermittent difficulty with word retrieval and answering SLP questions in conversation today.   PATIENT EDUCATION: Education details: see above Person educated: Patient Education method: Programmer, multimedia, Demonstration, Verbal cues, and Handouts Education comprehension: verbalized understanding, returned demonstration, and needs further education   GOALS: Goals reviewed with patient? Yes  SHORT TERM GOALS: Target date: 12/05/2022  Complete CLQT and PROM first 1-2 sessions with thorough discussion of results to heighten pt awareness  Baseline:  Goal status: MET  2.  Pt will follow multi-step instructions to complete functional task with ~90% accuracy given occasional min A for 2/2 opportunities  Baseline: 11-28-22 Goal status: PARTIALLY MET  3.  Pt will demonstrate increased attention to detail on structured iADL tasks with 3 or less errors exhibited  given occasional min A  Baseline: 12/05/22 Goal status: PARTIALLY MET  4.  Pt will demonstrate increased safety awareness by verbalizing problem and solution prior to initiating task for 2/2 opportunities  Baseline: 11-19-22 Goal status: PARTIALLY MET  5.   Pt will utilize processing/attention strategies in short structured conversations to complete entirety of thought for 80% of messages given occasional min A  Baseline: 11-19-22, 11-21-22 Goal status: MET  LONG TERM GOALS: Target date: 03/04/23 (DOWNGRADED at [facility])  Pt will successfully manage iADLs (medications, finances, appointments) with use of memory aids and compensations given supervision from wife Baseline:  Goal status: IN PROGRESS   2.  Pt will demonstrate improved safety awareness and attention to detail with less frequent A from wife to ID safety concerns and complete functional tasks given supervision from wife Baseline:  Goal status: IN PROGRESS   3.  Pt will utilize  processing/attention strategies in extended unstructured conversations to complete entirety of thoughts for ~90% of messages given intermittent min A Baseline:  Goal status: IN PROGRESS   4.  Pt will demonstrate [facility] verbal expression with ability to compensate for anomia/dysnomia with trained strategy given occasional min A in 30+ minute conversations  Baseline:  Goal status: IN PROGRESS   ASSESSMENT:  CLINICAL IMPRESSION: Patient is a 77 y.o. male who was seen today for cognitive linguistic rehabilitation following SDH from fall in July 2023. Continues to present with slow  improvements in cognition (memory, attention, executive functioning) and communication (expressive and receptive language) impacting his ability to independently manage his affairs and converse effectively, despite repetitive instruction and training of recommended techniques. Conducting ongoing  education and training with wife to aid cognitive functioning and carryover at home. Given functional impact and increased frustration for patient and wife, pt would benefit from skilled ST intervention to optimize cognitive functioning to maximize return to prior baseline.   OBJECTIVE IMPAIRMENTS: include attention, memory, awareness, and executive functioning. These impairments are limiting patient from managing medications, managing finances, and household responsibilities. Factors affecting potential to achieve goals and functional outcome are ability to learn/carryover information. Patient will benefit from skilled SLP services to address above impairments and improve overall function.  REHAB POTENTIAL: Good  PLAN:  SLP FREQUENCY: 2x/week  SLP DURATION: 12 weeks  PLANNED INTERVENTIONS: Language facilitation, Environmental controls, Cueing hierachy, Cognitive reorganization, Internal/external aids, Functional tasks, Multimodal communication approach, SLP instruction and feedback, Compensatory strategies, and Patient/family  education    Gracy Racer, CCC-SLP 02/25/2023, 10:33 AM

## 2023-02-25 NOTE — Patient Instructions (Signed)
Stay focused Use to-do list to help you get your tasks done Say what you are doing as you do it to keep your focus  Keep notes in one location (notebook)  If you are stuck on word, I want you to try to find it! Describe it as best you can Similar word/synonym

## 2023-02-26 MED ORDER — FENOFIBRATE MICRONIZED 67 MG PO CAPS: ORAL_CAPSULE | ORAL | 0 refills | Status: DC

## 2023-02-26 NOTE — Telephone Encounter (Signed)
Okay to restart fenofibrate.   Donato Schultz, MD   Patient's wife notified.  Prescription sent to [facility] on [address]

## 2023-02-27 ENCOUNTER — Telehealth: Payer: Self-pay | Admitting: Physical Therapy

## 2023-02-27 ENCOUNTER — Other Ambulatory Visit: Payer: Self-pay

## 2023-02-27 ENCOUNTER — Ambulatory Visit: Payer: Medicare PPO

## 2023-02-27 ENCOUNTER — Ambulatory Visit: Payer: Medicare PPO | Admitting: Physical Therapy

## 2023-02-27 DIAGNOSIS — R41841 Cognitive communication deficit: Secondary | ICD-10-CM

## 2023-02-27 NOTE — Telephone Encounter (Signed)
Called patient and LVM about missed PT initial eval. Reminded of 9:30am OT treatment. Encouraged patient to call back to reschedule when ready or to do so when came in for PT visit.  Maryruth Eve, PT, DPT

## 2023-02-27 NOTE — Therapy (Signed)
OUTPATIENT SPEECH LANGUAGE PATHOLOGY TREATMENT   Patient Name: Evan [name] MRN: [id number] DOB:1946/05/25, 77 y.o., male Today's Date: 02/27/2023  PCP: Marden Noble MD REFERRING PROVIDER: Tia Alert MD  END OF SESSION:  End of Session - 02/27/23 1107     Visit Number 24    Number of Visits 25    Date for SLP Re-Evaluation 03/04/23    Authorization Type [employer]    Authorization Time Period 12 ST addl visits apprd - 12/23/21    Authorization - Visit Number 11    Authorization - Number of Visits 12    SLP Start Time 0932    SLP Stop Time  1020    SLP Time Calculation (min) 48 min    Activity Tolerance Patient tolerated treatment well                  Past Medical History:  Diagnosis Date   Acquired dilation of ascending aorta and aortic root (HCC)    a.) measurements by TTE in 04/2021 --> root 40 mm and ascending aorta 43 mm   Anginal pain (HCC)    Anxiety    Atrial flutter (HCC) 04/22/2021   a.) CHADS2VASc = 5 (age x 2, HTN, T2DM, CAD); b.) daily warfarin   Carotid disease, bilateral (HCC)    a.) Doppler in 2019 --> 1-39% stenosis on RIGHT and 40-58% on LEFT   Cataracts, bilateral    Chronic anticoagulation    Warfarin   Chronic renal insufficiency    Coronary artery disease    a.) s/p 3v CABG in 1999   Depression    GERD (gastroesophageal reflux disease)    History of kidney stones    Hypercholesteremia    Hypertension    Kidney stone    Low testosterone    on TRT injections   Malignant melanoma (HCC)    a.) face, nose, left leg; b.) Tx'd with chemotherapy + XRT at [facility]   Myocardial infarction [facility])    Pulmonary emboli (HCC)    S/P CABG x 3 1999   a.) LIMA-LAD, SVG-RCA, SVG-diagnonal   Sleep apnea    not using nocturnal PAP therapy   T2DM (type 2 diabetes mellitus) (HCC)    Past Surgical History:  Procedure Laterality Date   CARDIAC CATHETERIZATION     x3     last 1999   CARDIOVERSION N/A 08/12/2021    Procedure: CARDIOVERSION;  Surgeon: Pricilla Riffle, MD;  Location: [facility] ENDOSCOPY;  Service: Cardiovascular;  Laterality: N/A;   CATARACT EXTRACTION Bilateral    COLONOSCOPY WITH PROPOFOL N/A 09/26/2014   Procedure: COLONOSCOPY WITH PROPOFOL;  Surgeon: Charolett Bumpers, MD;  Location: [facility] ENDOSCOPY;  Service: Endoscopy;  Laterality: N/A;   CORONARY ARTERY BYPASS GRAFT N/A 1999   3v; LIMA-LAD, SVG-RCA, SVG-diagonal   HOLEP-LASER ENUCLEATION OF THE PROSTATE WITH MORCELLATION N/A 06/07/2021   Procedure: HOLEP-LASER ENUCLEATION OF THE PROSTATE WITH MORCELLATION;  Surgeon: Sondra Come, MD;  Location: [facility] ORS;  Service: Urology;  Laterality: N/A;   KNEE ARTHROSCOPY Left    LAPAROSCOPIC CHOLECYSTECTOMY     MASS EXCISION  04/15/2012   Procedure: EXCISION MASS;  Surgeon: Suzanna Obey, MD;  Location: [facility] OR;  Service: ENT;  Laterality: Left;  Left tonsil biopsy   melanomia     several skin ca removed-nose, left ankle, parotid gland left, right nodes-being followed annually..   Patient Active Problem List   Diagnosis Date Noted   Hypoglycemia 02/14/2023   SDH (  subdural hematoma) (HCC) 04/11/2022   Fall (on)(from) sidewalk curb, initial encounter 04/11/2022   History of melanoma excision 04/11/2022   Diabetes mellitus type 2 in obese 04/11/2022   DNR (do not resuscitate) 04/11/2022   Chronic anticoagulation 06/30/2021   Typical atrial flutter (HCC) 06/28/2021   Pure hypercholesterolemia 12/23/2013   Carotid artery disease (HCC) 12/23/2013   Essential hypertension, benign 12/23/2013   Angina decubitus 12/23/2013   Coronary artery disease    Cancer (HCC)    Depression    Pulmonary emboli (HCC)    Hypercholesteremia    Kidney stone    Chronic renal insufficiency     ONSET DATE: 04/11/2022 (referral=11/06/22)  REFERRING DIAG: S06.5XAA- Traumatic subdural hemorrhage with loss of consciousness   THERAPY DIAG: Cognitive communication deficit  Rationale for Evaluation and Treatment:  Rehabilitation  SUBJECTIVE:   SUBJECTIVE STATEMENT: "it's just been a day." Pt accompanied by: self  PERTINENT HISTORY: 77 y.o. male presents to [facility] hospital on 7/14 after falling. Pt found to have small parafalcine SDH and right tentorial SDH , reporting nausea, HA, and dizziness. PMH:  includes anxiety, aflutter, depression, HTN, PE, CABG, DMII.   PAIN: Are you having pain? No  FALLS: Has patient fallen in last 6 months?  Yes, Number of falls: 3-4 (tripping)  PATIENT GOALS: "I want to be able to figure things out on my own. I want to confidently talk with people and not feel like I have to run and hide "   OBJECTIVE:   TODAY'S TREATMENT:                                                                                                                                         02-27-23: Patient missed earlier PT evaluation as both pt and wife stated that they did not know of the appointment. Patient became frustrated in discussion of recent medical information, as pt unable to recall MD name.  Both wife and SLP cued use of  written memory cheat sheet as pt unable to recall with max-A of semantic cues from wife. HEP successfully completed as written on to-do list. Signs for reminders were put up in bathroom and were proven to be helpful to recall donning hearing aids. Led through [employer] pay worksheet with min-A and suggestions to be specific about due dates/paid as well as labeling specific month. Discussed continual need for speech therapy, as pt and wife reporting continued concerns of pt stammering/stuttering d/t anomia and frustration with communication breakdowns. Provided education and instruction of recommendations to optimize communication, including intentional physchological sigh to reduce frustration, preparation for certain stressful communication scenarios (appointments and telephone calls) with scripting and writing down questions beforehand, and self-advocacy re: brain injury in  conversation to request additional time, repetition, and written aids. Both patient and wife verbalized understanding and appreciation for recommendations.    02-25-23: Educated patient and wife on recommendations to optimize  attention, recall, and verbal expression for functional tasks at home. Wife reported recent improvements in communication. Continue to recommend use of external aids to manage to-do lists and written reminders, with organization management provided today. Provided modeling and cues for wife to assist patient at home. See patient instructions for more details.   02-18-23: Prompted discussion re: recent hospitalization with intermittent min-mod A from wife required for word retrieval and recall. Benefited from extra processing time. Wife reported pt with increased fatigue when completing household tasks (MD planning to refer for PT). Engaged patient in discussion re: safety awareness and problem solving to ID current cognitive/physical limitations and pre-planning to account for deficits. Suggested discussing his plan aloud with his wife prior to initiating task. Provided education re: using visual aids to optimize attention and recall (ex: don hearing aids) as pt needs usual reminders at home. Continued to recommend wife assistance with medication and bills d/t reduced error awareness, inattention, and recall.   5-17-24[name] attended session today. Pt accurately recalled MD name from last session, indicating success of Spaced Retrieval therapy technique. Again, re-iterated ability to use this intervention with impaired recall of other functional information. Pt able to appropriately locate memory "cheat sheet" but no new additions included. [name] endorsed now assisting with medications d/t MD questioning medication management at last appointment. Re-educated previous SLP recommendation for wife assisting with medicines d/t pt exhibiting reduced error awareness and attention/memory  deficits. Suggested scaffolding assistance during functional tasks at home, including supervision first and increasing wife assistance as needed. Current medication and financial systems to be re-assessed this weekend with wife supervision, given pt has been managing independently with visual aids. Plan to target some online bill pay and financial management scenarios next session.   02-11-23: Attempted to retell recent story related to recalling medical information but unable to recall MD name. Did successfully request written information to aid processing and recall as recommended. Cued use of visual memory support, which was effective. Utilized Office manager Therapy to target recall of MD name, in which pt able to recall targeted response up to 16 minute interval indicating increased ability to retain targeted information. Unable to recall relevant family information (grandchildren names); therefore, SLP prompted use of visual support (family tree) to aid recall. SLP prompted sentence completion was helpful to recall one granddaughter. Continued to recommend writing down relevant information as needed.   02-06-23: Wife attended session today. Provided education of clinical observations and targeted ST interventions related to ongoing cognitive and communication challenges. Conducted caregiver education re: recommended techniques to optimize recall/attention in conversation (writing notes, internal rehearsal) as well as word finding (cueing hierarchy) to reduce vague speech. Wife verbalized understanding and appreciation for recommendations. Today, targeted use of note writing to aid attention and recall in conversation. Pt able to completed with some intermittent mod A to focus on key words versus sentences.   02-04-23: Continues to come alone despite recommendation for wife to attend ST. No additional information added to "memory cheat sheet" other than PCP name from last session. Dicussed recent PCP  appointment with reduced recall noted. Addressed mentioned concerns pertinent to ST, including memory loss (I.e., forgetting medications) and overall reduced awareness. Educated and instructed pertinent cognitive compensations and recommendations to aid completion of iADLs (recommending wife supervision and assistance with meds) and recall of functional information. Targeted writing down information given auditory stimuli (voicemails), with frequent repetition required to aid accuracy of recall. Recertification completed for remaining visits with LTGs downgraded d/t persistent cognitive challenges  despite SLP intervention, which may be indicative of underlying memory/cognitive impairment.   01-28-23: Errors noted on homework despite explicit written instructions to optimize descriptions for word retrieval. Pt continues to use one word or vague response to describe item despite repetitive instruction and practice. Modeling and re-instruction was ineffective so provided alternate strategy for synonyms for word retrieval. Occasional mod A required to ID appropriate synonyms on structured task. Endorsed difficulty recalling name of new PCP. SLP generated written aid "memory cheat sheet" to write down pertinent information that pt has difficulty consistently recalling. Reported good carryover of to-do lists and pre-planning with wife as previously instructed. Suggested wife participate in therapy sessions to aid carryover and provide caregiver education.    01-26-23: Reviewed homework to use description strategy for word retrieval, with noted difficulty understanding and implementing compensation. SLP re-educated rationale and structured techniques to optimize describing ability (SFA, written prompts, modeling) to aid word finding. Benefited from usual fading to occasional modeling and verbal cues to optimize specificity of descriptions. Usual extra processing time was beneficial for word retrieval and comprehension.    01-21-23: Endorsed difficulty alternating attention to transfer information between printed calendar and personal home calendar. Initial max A required to orient to today's date; recommended use of phone to aid orientation. Good carryover of phone exhibited throughout session when cued. Targeted transferring information from one written calendar to another with usual fading to occasional max A required to complete task. Overt errors noted x3 which pt did not ID even with cued double check. Given ongoing overt difficulty managing calendar due to impaired attention, recall, and awareness, recommended increased assistance from wife as pt unable to carryover trained techniques independently. Reported some use of trained description strategy to aid anomia with less frustration reported. Reviewed descriptions written for homework with usual max cues required to increase specificity. Updated HEP to optimize descriptions for additional practice.  01-12-23: Returned after nearly 1 month d/t awaiting auth for additional ST visits. In verbal recap of last month, pt reported he "flunked" driving test which resulted in revoking of driver's license. Also reported missing several hearing aid appointments due to life situations. Endorsed good carryover of calendar and memory supports to aid recall, with success indicated. Cued recall of recent ED event, in which pt endorsed possible psychosis. Some difficulty with thought organization and verbal expression noted today in conversation. Conversational speech c/b intermittent vague language and occasional anomia. Pt stated "there are some words I can't get out." Targeted word retrieval strategies and SFA this session to optimize communication effectiveness. Usual mod A required to aid comprehension and carryover.   12-19-22: Able to demo improved good problem solving, in which pt identified appropriate hearing clinic with appointment scheduled. Cued patient to write appointment  on calendar. Min A required to ID correct date (19th vs 29th). Targeted problem solving to determine if this clinic accepts his insurance. Prompted using computer to investigate. Intermittent min cues required to maintain attention to targeted subject. Educated and instructed attention/processing/comprehension strategies to aid patient participation in conversation. Identified strategies x4 to implement at home with wife to repair communication breakdown. Due to time constraints, word finding strategies to be educated and trained next session.    12-17-22: Pt has demonstrated increased ability to manage medications more effectively with revision of updated personal medication list and double checking per SLP recommendations. Reported progress with memory but endorsed difficulty in conversation, particularly over the phone. Deduced difficulty stems from hearing impairment and auditory processing/comprehension. Targeted functional problem solving  to address hearing impairment with use of visual aid. With usual prompting to deduce possible solutions, pt able to ID best solution (see audiologist). Pt able to sequence appropriate next steps with usual min A. SLP initiated education and instruction of attention/auditory comprehension strategies to aid attention, recall, and understanding in conversation. Pt would benefit from additional training to aid receptive and expressive language as pt noted with intermittent difficulty with word retrieval and answering SLP questions in conversation today.   PATIENT EDUCATION: Education details: see above Person educated: Patient Education method: Programmer, multimedia, Demonstration, Verbal cues, and Handouts Education comprehension: verbalized understanding, returned demonstration, and needs further education   GOALS: Goals reviewed with patient? Yes  SHORT TERM GOALS: Target date: 12/05/2022  Complete CLQT and PROM first 1-2 sessions with thorough discussion of results to  heighten pt awareness  Baseline:  Goal status: MET  2.  Pt will follow multi-step instructions to complete functional task with ~90% accuracy given occasional min A for 2/2 opportunities  Baseline: 11-28-22 Goal status: PARTIALLY MET  3.  Pt will demonstrate increased attention to detail on structured iADL tasks with 3 or less errors exhibited given occasional min A  Baseline: 12/05/22 Goal status: PARTIALLY MET  4.  Pt will demonstrate increased safety awareness by verbalizing problem and solution prior to initiating task for 2/2 opportunities  Baseline: 11-19-22 Goal status: PARTIALLY MET  5.   Pt will utilize processing/attention strategies in short structured conversations to complete entirety of thought for 80% of messages given occasional min A  Baseline: 11-19-22, 11-21-22 Goal status: MET  LONG TERM GOALS: Target date: 03/04/23 (DOWNGRADED at [facility])  Pt will successfully manage iADLs (medications, finances, appointments) with use of memory aids and compensations given supervision from wife Baseline:  Goal status: IN PROGRESS   2.  Pt will demonstrate improved safety awareness and attention to detail with less frequent A from wife to ID safety concerns and complete functional tasks given supervision from wife Baseline:  Goal status: IN PROGRESS   3.  Pt will utilize processing/attention strategies in extended unstructured conversations to complete entirety of thoughts for ~90% of messages given intermittent min A Baseline:  Goal status: IN PROGRESS   4.  Pt will demonstrate [facility] verbal expression with ability to compensate for anomia/dysnomia with trained strategy given occasional min A in 30+ minute conversations  Baseline:  Goal status: IN PROGRESS   ASSESSMENT:  CLINICAL IMPRESSION: Patient is a 77 y.o. male who was seen today for cognitive linguistic rehabilitation following SDH from fall in July 2023. Continues to present with slow but steady improvements in cognition  (memory, attention, executive functioning) and communication (expressive and receptive language) impacting his ability to independently manage his affairs and converse effectively. Conducting ongoing education and training with wife to aid communication and cognitive functioning for increased carryover at home. Given functional impact and increased frustration for patient and wife, pt would benefit from skilled ST intervention to optimize cognitive functioning to maximize return to prior baseline.   OBJECTIVE IMPAIRMENTS: include attention, memory, awareness, and executive functioning. These impairments are limiting patient from managing medications, managing finances, and household responsibilities. Factors affecting potential to achieve goals and functional outcome are ability to learn/carryover information. Patient will benefit from skilled SLP services to address above impairments and improve overall function.  REHAB POTENTIAL: Good  PLAN:  SLP FREQUENCY: 2x/week  SLP DURATION: 12 weeks  PLANNED INTERVENTIONS: Language facilitation, Environmental controls, Cueing hierachy, Cognitive reorganization, Internal/external aids, Functional tasks, Multimodal communication approach, SLP  instruction and feedback, Compensatory strategies, and Patient/family education    Gracy Racer, CCC-SLP 02/27/2023, 11:08 AM

## 2023-03-04 ENCOUNTER — Ambulatory Visit: Payer: 59 | Admitting: Neurology

## 2023-03-05 ENCOUNTER — Ambulatory Visit
Admission: RE | Admit: 2023-03-05 | Discharge: 2023-03-05 | Disposition: A | Discharge status: Home / Self Care | Payer: Medicare PPO | Source: Ambulatory Visit | Attending: Internal Medicine | Admitting: Internal Medicine

## 2023-03-05 DIAGNOSIS — R413 Other amnesia: Secondary | ICD-10-CM | POA: Diagnosis not present

## 2023-03-05 DIAGNOSIS — R410 Disorientation, unspecified: Secondary | ICD-10-CM | POA: Diagnosis not present

## 2023-03-06 ENCOUNTER — Ambulatory Visit: Payer: Worker's Compensation | Admitting: Physical Therapy

## 2023-03-06 ENCOUNTER — Ambulatory Visit: Payer: Worker's Compensation

## 2023-03-11 ENCOUNTER — Telehealth: Payer: Self-pay

## 2023-03-11 ENCOUNTER — Ambulatory Visit: Payer: Worker's Compensation | Attending: Neurological Surgery

## 2023-03-11 NOTE — Therapy (Deleted)
OUTPATIENT SPEECH LANGUAGE PATHOLOGY TREATMENT   Patient Name: Evan [name] MRN: [id number] DOB:April 11, 1946, 77 y.o., male Today's Date: 03/11/2023  PCP: Marden Noble MD REFERRING PROVIDER: Tia Alert MD  END OF SESSION:         Past Medical History:  Diagnosis Date   Acquired dilation of ascending aorta and aortic root (HCC)    a.) measurements by TTE in 04/2021 --> root 40 mm and ascending aorta 43 mm   Anginal pain (HCC)    Anxiety    Atrial flutter (HCC) 04/22/2021   a.) CHADS2VASc = 5 (age x 2, HTN, T2DM, CAD); b.) daily warfarin   Carotid disease, bilateral (HCC)    a.) Doppler in 2019 --> 1-39% stenosis on RIGHT and 40-58% on LEFT   Cataracts, bilateral    Chronic anticoagulation    Warfarin   Chronic renal insufficiency    Coronary artery disease    a.) s/p 3v CABG in 1999   Depression    GERD (gastroesophageal reflux disease)    History of kidney stones    Hypercholesteremia    Hypertension    Kidney stone    Low testosterone    on TRT injections   Malignant melanoma (HCC)    a.) face, nose, left leg; b.) Tx'd with chemotherapy + XRT at [facility]   Myocardial infarction [facility])    Pulmonary emboli (HCC)    S/P CABG x 3 1999   a.) LIMA-LAD, SVG-RCA, SVG-diagnonal   Sleep apnea    not using nocturnal PAP therapy   T2DM (type 2 diabetes mellitus) (HCC)    Past Surgical History:  Procedure Laterality Date   CARDIAC CATHETERIZATION     x3     last 1999   CARDIOVERSION N/A 08/12/2021   Procedure: CARDIOVERSION;  Surgeon: Pricilla Riffle, MD;  Location: [facility] ENDOSCOPY;  Service: Cardiovascular;  Laterality: N/A;   CATARACT EXTRACTION Bilateral    COLONOSCOPY WITH PROPOFOL N/A 09/26/2014   Procedure: COLONOSCOPY WITH PROPOFOL;  Surgeon: Charolett Bumpers, MD;  Location: [facility] ENDOSCOPY;  Service: Endoscopy;  Laterality: N/A;   CORONARY ARTERY BYPASS GRAFT N/A 1999   3v; LIMA-LAD, SVG-RCA, SVG-diagonal   HOLEP-LASER ENUCLEATION OF THE PROSTATE WITH  MORCELLATION N/A 06/07/2021   Procedure: HOLEP-LASER ENUCLEATION OF THE PROSTATE WITH MORCELLATION;  Surgeon: Sondra Come, MD;  Location: [facility] ORS;  Service: Urology;  Laterality: N/A;   KNEE ARTHROSCOPY Left    LAPAROSCOPIC CHOLECYSTECTOMY     MASS EXCISION  04/15/2012   Procedure: EXCISION MASS;  Surgeon: Suzanna Obey, MD;  Location: [facility] OR;  Service: ENT;  Laterality: Left;  Left tonsil biopsy   melanomia     several skin ca removed-nose, left ankle, parotid gland left, right nodes-being followed annually..   Patient Active Problem List   Diagnosis Date Noted   Hypoglycemia 02/14/2023   SDH (subdural hematoma) (HCC) 04/11/2022   Fall (on)(from) sidewalk curb, initial encounter 04/11/2022   History of melanoma excision 04/11/2022   Diabetes mellitus type 2 in obese 04/11/2022   DNR (do not resuscitate) 04/11/2022   Chronic anticoagulation 06/30/2021   Typical atrial flutter (HCC) 06/28/2021   Pure hypercholesterolemia 12/23/2013   Carotid artery disease (HCC) 12/23/2013   Essential hypertension, benign 12/23/2013   Angina decubitus 12/23/2013   Coronary artery disease    Cancer (HCC)    Depression    Pulmonary emboli (HCC)    Hypercholesteremia    Kidney stone    Chronic renal insufficiency     ONSET  DATE: 04/11/2022 (referral=11/06/22)  REFERRING DIAG: S06.5XAA- Traumatic subdural hemorrhage with loss of consciousness   THERAPY DIAG: No diagnosis found.  Rationale for Evaluation and Treatment: Rehabilitation  SUBJECTIVE:   SUBJECTIVE STATEMENT: *** Pt accompanied by: self  PERTINENT HISTORY: 77 y.o. male presents to [facility] hospital on 7/14 after falling. Pt found to have small parafalcine SDH and right tentorial SDH , reporting nausea, HA, and dizziness. PMH:  includes anxiety, aflutter, depression, HTN, PE, CABG, DMII.   PAIN: Are you having pain? No  FALLS: Has patient fallen in last 6 months?  Yes, Number of falls: 3-4 (tripping)  PATIENT GOALS: "I want to be able  to figure things out on my own. I want to confidently talk with people and not feel like I have to run and hide "   OBJECTIVE:   TODAY'S TREATMENT:                                                                                                                                         03-11-23: ***   02-27-23: Patient missed earlier PT evaluation as both pt and wife stated that they did not know of the appointment. Patient became frustrated in discussion of recent medical information, as pt unable to recall MD name.  Both wife and SLP cued use of  written memory cheat sheet as pt unable to recall with max-A of semantic cues from wife. HEP successfully completed as written on to-do list. Signs for reminders were put up in bathroom and were proven to be helpful to recall donning hearing aids. Led through [employer] pay worksheet with min-A and suggestions to be specific about due dates/paid as well as labeling specific month. Discussed continual need for speech therapy, as pt and wife reporting continued concerns of pt stammering/stuttering d/t anomia and frustration with communication breakdowns. Provided education and instruction of recommendations to optimize communication, including intentional physchological sigh to reduce frustration, preparation for certain stressful communication scenarios (appointments and telephone calls) with scripting and writing down questions beforehand, and self-advocacy re: brain injury in conversation to request additional time, repetition, and written aids. Both patient and wife verbalized understanding and appreciation for recommendations.    02-25-23: Educated patient and wife on recommendations to optimize attention, recall, and verbal expression for functional tasks at home. Wife reported recent improvements in communication. Continue to recommend use of external aids to manage to-do lists and written reminders, with organization management provided today. Provided  modeling and cues for wife to assist patient at home. See patient instructions for more details.   02-18-23: Prompted discussion re: recent hospitalization with intermittent min-mod A from wife required for word retrieval and recall. Benefited from extra processing time. Wife reported pt with increased fatigue when completing household tasks (MD planning to refer for PT). Engaged patient in discussion re: safety awareness and problem solving to ID current cognitive/physical limitations and pre-planning to  account for deficits. Suggested discussing his plan aloud with his wife prior to initiating task. Provided education re: using visual aids to optimize attention and recall (ex: don hearing aids) as pt needs usual reminders at home. Continued to recommend wife assistance with medication and bills d/t reduced error awareness, inattention, and recall.   5-17-24[name] attended session today. Pt accurately recalled MD name from last session, indicating success of Spaced Retrieval therapy technique. Again, re-iterated ability to use this intervention with impaired recall of other functional information. Pt able to appropriately locate memory "cheat sheet" but no new additions included. [name] endorsed now assisting with medications d/t MD questioning medication management at last appointment. Re-educated previous SLP recommendation for wife assisting with medicines d/t pt exhibiting reduced error awareness and attention/memory deficits. Suggested scaffolding assistance during functional tasks at home, including supervision first and increasing wife assistance as needed. Current medication and financial systems to be re-assessed this weekend with wife supervision, given pt has been managing independently with visual aids. Plan to target some online bill pay and financial management scenarios next session.   02-11-23: Attempted to retell recent story related to recalling medical information but unable to recall MD  name. Did successfully request written information to aid processing and recall as recommended. Cued use of visual memory support, which was effective. Utilized Office manager Therapy to target recall of MD name, in which pt able to recall targeted response up to 16 minute interval indicating increased ability to retain targeted information. Unable to recall relevant family information (grandchildren names); therefore, SLP prompted use of visual support (family tree) to aid recall. SLP prompted sentence completion was helpful to recall one granddaughter. Continued to recommend writing down relevant information as needed.   02-06-23: Wife attended session today. Provided education of clinical observations and targeted ST interventions related to ongoing cognitive and communication challenges. Conducted caregiver education re: recommended techniques to optimize recall/attention in conversation (writing notes, internal rehearsal) as well as word finding (cueing hierarchy) to reduce vague speech. Wife verbalized understanding and appreciation for recommendations. Today, targeted use of note writing to aid attention and recall in conversation. Pt able to completed with some intermittent mod A to focus on key words versus sentences.   02-04-23: Continues to come alone despite recommendation for wife to attend ST. No additional information added to "memory cheat sheet" other than PCP name from last session. Dicussed recent PCP appointment with reduced recall noted. Addressed mentioned concerns pertinent to ST, including memory loss (I.e., forgetting medications) and overall reduced awareness. Educated and instructed pertinent cognitive compensations and recommendations to aid completion of iADLs (recommending wife supervision and assistance with meds) and recall of functional information. Targeted writing down information given auditory stimuli (voicemails), with frequent repetition required to aid accuracy of recall.  Recertification completed for remaining visits with LTGs downgraded d/t persistent cognitive challenges despite SLP intervention, which may be indicative of underlying memory/cognitive impairment.   01-28-23: Errors noted on homework despite explicit written instructions to optimize descriptions for word retrieval. Pt continues to use one word or vague response to describe item despite repetitive instruction and practice. Modeling and re-instruction was ineffective so provided alternate strategy for synonyms for word retrieval. Occasional mod A required to ID appropriate synonyms on structured task. Endorsed difficulty recalling name of new PCP. SLP generated written aid "memory cheat sheet" to write down pertinent information that pt has difficulty consistently recalling. Reported good carryover of to-do lists and pre-planning with wife as previously instructed. Suggested wife participate  in therapy sessions to aid carryover and provide caregiver education.    01-26-23: Reviewed homework to use description strategy for word retrieval, with noted difficulty understanding and implementing compensation. SLP re-educated rationale and structured techniques to optimize describing ability (SFA, written prompts, modeling) to aid word finding. Benefited from usual fading to occasional modeling and verbal cues to optimize specificity of descriptions. Usual extra processing time was beneficial for word retrieval and comprehension.   01-21-23: Endorsed difficulty alternating attention to transfer information between printed calendar and personal home calendar. Initial max A required to orient to today's date; recommended use of phone to aid orientation. Good carryover of phone exhibited throughout session when cued. Targeted transferring information from one written calendar to another with usual fading to occasional max A required to complete task. Overt errors noted x3 which pt did not ID even with cued double check. Given  ongoing overt difficulty managing calendar due to impaired attention, recall, and awareness, recommended increased assistance from wife as pt unable to carryover trained techniques independently. Reported some use of trained description strategy to aid anomia with less frustration reported. Reviewed descriptions written for homework with usual max cues required to increase specificity. Updated HEP to optimize descriptions for additional practice.  01-12-23: Returned after nearly 1 month d/t awaiting auth for additional ST visits. In verbal recap of last month, pt reported he "flunked" driving test which resulted in revoking of driver's license. Also reported missing several hearing aid appointments due to life situations. Endorsed good carryover of calendar and memory supports to aid recall, with success indicated. Cued recall of recent ED event, in which pt endorsed possible psychosis. Some difficulty with thought organization and verbal expression noted today in conversation. Conversational speech c/b intermittent vague language and occasional anomia. Pt stated "there are some words I can't get out." Targeted word retrieval strategies and SFA this session to optimize communication effectiveness. Usual mod A required to aid comprehension and carryover.   12-19-22: Able to demo improved good problem solving, in which pt identified appropriate hearing clinic with appointment scheduled. Cued patient to write appointment on calendar. Min A required to ID correct date (19th vs 29th). Targeted problem solving to determine if this clinic accepts his insurance. Prompted using computer to investigate. Intermittent min cues required to maintain attention to targeted subject. Educated and instructed attention/processing/comprehension strategies to aid patient participation in conversation. Identified strategies x4 to implement at home with wife to repair communication breakdown. Due to time constraints, word finding  strategies to be educated and trained next session.    12-17-22: Pt has demonstrated increased ability to manage medications more effectively with revision of updated personal medication list and double checking per SLP recommendations. Reported progress with memory but endorsed difficulty in conversation, particularly over the phone. Deduced difficulty stems from hearing impairment and auditory processing/comprehension. Targeted functional problem solving to address hearing impairment with use of visual aid. With usual prompting to deduce possible solutions, pt able to ID best solution (see audiologist). Pt able to sequence appropriate next steps with usual min A. SLP initiated education and instruction of attention/auditory comprehension strategies to aid attention, recall, and understanding in conversation. Pt would benefit from additional training to aid receptive and expressive language as pt noted with intermittent difficulty with word retrieval and answering SLP questions in conversation today.   PATIENT EDUCATION: Education details: see above Person educated: Patient Education method: Explanation, Demonstration, Verbal cues, and Handouts Education comprehension: verbalized understanding, returned demonstration, and needs further education  GOALS: Goals reviewed with patient? Yes  SHORT TERM GOALS: Target date: 12/05/2022  Complete CLQT and PROM first 1-2 sessions with thorough discussion of results to heighten pt awareness  Baseline:  Goal status: MET  2.  Pt will follow multi-step instructions to complete functional task with ~90% accuracy given occasional min A for 2/2 opportunities  Baseline: 11-28-22 Goal status: PARTIALLY MET  3.  Pt will demonstrate increased attention to detail on structured iADL tasks with 3 or less errors exhibited given occasional min A  Baseline: 12/05/22 Goal status: PARTIALLY MET  4.  Pt will demonstrate increased safety awareness by verbalizing problem and  solution prior to initiating task for 2/2 opportunities  Baseline: 11-19-22 Goal status: PARTIALLY MET  5.   Pt will utilize processing/attention strategies in short structured conversations to complete entirety of thought for 80% of messages given occasional min A  Baseline: 11-19-22, 11-21-22 Goal status: MET  LONG TERM GOALS: Target date: 03/04/23 ([facility]GRADED at [facility])  Pt will successfully manage iADLs (medications, finances, appointments) with use of memory aids and compensations given supervision from wife Baseline:  Goal status: IN PROGRESS   2.  Pt will demonstrate improved safety awareness and attention to detail with less frequent A from wife to ID safety concerns and complete functional tasks given supervision from wife Baseline:  Goal status: IN PROGRESS   3.  Pt will utilize processing/attention strategies in extended unstructured conversations to complete entirety of thoughts for ~90% of messages given intermittent min A Baseline:  Goal status: IN PROGRESS   4.  Pt will demonstrate [facility] verbal expression with ability to compensate for anomia/dysnomia with trained strategy given occasional min A in 30+ minute conversations  Baseline:  Goal status: IN PROGRESS   ASSESSMENT:  CLINICAL IMPRESSION: Patient is a 77 y.o. male who was seen today for cognitive linguistic rehabilitation following SDH from fall in July 2023. Continues to present with slow but steady improvements in cognition (memory, attention, executive functioning) and communication (expressive and receptive language) impacting his ability to independently manage his affairs and converse effectively. Conducting ongoing education and training with wife to aid communication and cognitive functioning for increased carryover at home. Given functional impact and increased frustration for patient and wife, pt would benefit from skilled ST intervention to optimize cognitive functioning to maximize return to prior baseline.    OBJECTIVE IMPAIRMENTS: include attention, memory, awareness, and executive functioning. These impairments are limiting patient from managing medications, managing finances, and household responsibilities. Factors affecting potential to achieve goals and functional outcome are ability to learn/carryover information. Patient will benefit from skilled SLP services to address above impairments and improve overall function.  REHAB POTENTIAL: Good  PLAN:  SLP FREQUENCY: 2x/week  SLP DURATION: 12 weeks  PLANNED INTERVENTIONS: Language facilitation, Environmental controls, Cueing hierachy, Cognitive reorganization, Internal/external aids, Functional tasks, Multimodal communication approach, SLP instruction and feedback, Compensatory strategies, and Patient/family education    [place], Student-SLP 03/11/2023, 8:46 AM

## 2023-03-11 NOTE — Telephone Encounter (Signed)
Called patient's preferred number re: no-show ST appointment today (was rescheduled from last Friday). Left voicemail and requested return call to front office to re-schedule missed appointment.

## 2023-03-16 ENCOUNTER — Ambulatory Visit: Payer: Worker's Compensation | Attending: Internal Medicine

## 2023-03-16 DIAGNOSIS — R41841 Cognitive communication deficit: Secondary | ICD-10-CM | POA: Insufficient documentation

## 2023-03-16 NOTE — Therapy (Signed)
OUTPATIENT SPEECH LANGUAGE PATHOLOGY TREATMENT (RECERT)  Patient Name: Evan [name] MRN: [id number] DOB:1946/04/07, 77 y.o., male Today's Date: 03/16/2023  PCP: Marden Noble MD REFERRING PROVIDER: Tia Alert MD  END OF SESSION:  End of Session - 03/16/23 1229     Visit Number 25    Number of Visits 33   8 more visits requested   Date for SLP Re-Evaluation 04/27/23   written 6 weeks to account for scheduling   Authorization Type [employer]    Authorization Time Period --    Authorization - Visit Number 12    Authorization - Number of Visits 12    SLP Start Time 1231    SLP Stop Time  1316    SLP Time Calculation (min) 45 min    Activity Tolerance Patient tolerated treatment well                   Past Medical History:  Diagnosis Date   Acquired dilation of ascending aorta and aortic root (HCC)    a.) measurements by TTE in 04/2021 --> root 40 mm and ascending aorta 43 mm   Anginal pain (HCC)    Anxiety    Atrial flutter (HCC) 04/22/2021   a.) CHADS2VASc = 5 (age x 2, HTN, T2DM, CAD); b.) daily warfarin   Carotid disease, bilateral (HCC)    a.) Doppler in 2019 --> 1-39% stenosis on RIGHT and 40-58% on LEFT   Cataracts, bilateral    Chronic anticoagulation    Warfarin   Chronic renal insufficiency    Coronary artery disease    a.) s/p 3v CABG in 1999   Depression    GERD (gastroesophageal reflux disease)    History of kidney stones    Hypercholesteremia    Hypertension    Kidney stone    Low testosterone    on TRT injections   Malignant melanoma (HCC)    a.) face, nose, left leg; b.) Tx'd with chemotherapy + XRT at [facility]   Myocardial infarction [facility])    Pulmonary emboli (HCC)    S/P CABG x 3 1999   a.) LIMA-LAD, SVG-RCA, SVG-diagnonal   Sleep apnea    not using nocturnal PAP therapy   T2DM (type 2 diabetes mellitus) (HCC)    Past Surgical History:  Procedure Laterality Date   CARDIAC CATHETERIZATION     x3     last  1999   CARDIOVERSION N/A 08/12/2021   Procedure: CARDIOVERSION;  Surgeon: Pricilla Riffle, MD;  Location: [facility] ENDOSCOPY;  Service: Cardiovascular;  Laterality: N/A;   CATARACT EXTRACTION Bilateral    COLONOSCOPY WITH PROPOFOL N/A 09/26/2014   Procedure: COLONOSCOPY WITH PROPOFOL;  Surgeon: Charolett Bumpers, MD;  Location: [facility] ENDOSCOPY;  Service: Endoscopy;  Laterality: N/A;   CORONARY ARTERY BYPASS GRAFT N/A 1999   3v; LIMA-LAD, SVG-RCA, SVG-diagonal   HOLEP-LASER ENUCLEATION OF THE PROSTATE WITH MORCELLATION N/A 06/07/2021   Procedure: HOLEP-LASER ENUCLEATION OF THE PROSTATE WITH MORCELLATION;  Surgeon: Sondra Come, MD;  Location: [facility] ORS;  Service: Urology;  Laterality: N/A;   KNEE ARTHROSCOPY Left    LAPAROSCOPIC CHOLECYSTECTOMY     MASS EXCISION  04/15/2012   Procedure: EXCISION MASS;  Surgeon: Suzanna Obey, MD;  Location: [facility] OR;  Service: ENT;  Laterality: Left;  Left tonsil biopsy   melanomia     several skin ca removed-nose, left ankle, parotid gland left, right nodes-being followed annually..   Patient Active Problem List   Diagnosis Date  Noted   Hypoglycemia 02/14/2023   SDH (subdural hematoma) (HCC) 04/11/2022   Fall (on)(from) sidewalk curb, initial encounter 04/11/2022   History of melanoma excision 04/11/2022   Diabetes mellitus type 2 in obese 04/11/2022   DNR (do not resuscitate) 04/11/2022   Chronic anticoagulation 06/30/2021   Typical atrial flutter (HCC) 06/28/2021   Pure hypercholesterolemia 12/23/2013   Carotid artery disease (HCC) 12/23/2013   Essential hypertension, benign 12/23/2013   Angina decubitus 12/23/2013   Coronary artery disease    Cancer (HCC)    Depression    Pulmonary emboli (HCC)    Hypercholesteremia    Kidney stone    Chronic renal insufficiency     ONSET DATE: 04/11/2022 (referral=11/06/22)  REFERRING DIAG: S06.5XAA- Traumatic subdural hemorrhage with loss of consciousness   THERAPY DIAG: Cognitive communication deficit  Rationale for  Evaluation and Treatment: Rehabilitation  SUBJECTIVE:   SUBJECTIVE STATEMENT: "been middling"  Pt accompanied by: self  PERTINENT HISTORY: 77 y.o. male presents to [facility] hospital on 7/14 after falling. Pt found to have small parafalcine SDH and right tentorial SDH , reporting nausea, HA, and dizziness. PMH:  includes anxiety, aflutter, depression, HTN, PE, CABG, DMII.   PAIN: Are you having pain? No  FALLS: Has patient fallen in last 6 months?  Yes, Number of falls: 3-4 (tripping)  PATIENT GOALS: "I want to be able to figure things out on my own. I want to confidently talk with people and not feel like I have to run and hide "   OBJECTIVE:   TODAY'S TREATMENT:                                                                                                                                         03-16-23: Patient and SLP discusses miscommunication with previous two missed appointments. Patient advocated for himself and asked the front desk to write down his appointments for him. Patient has added names to his cheat sheet to find names of people who he has forgotten. SLP led through putting labels for each parson to for clarity, and to add notes for clarity. Patient states that [name] is managing his pills and that he feels very safe with the system that they have set up at home. SLP reviewed goals with patient and updated progress towards goals. Education provided on speech therapy in addressing word finding, memory, attention, and other cognitive tasks. Discussed medical history/medication to add to create a separate cheat sheet. Patient sent home to complete medical cheat sheet.   02-27-23: Patient missed earlier PT evaluation as both pt and wife stated that they did not know of the appointment. Patient became frustrated in discussion of recent medical information, as pt unable to recall MD name.  Both wife and SLP cued use of  written memory cheat sheet as pt unable to recall with max-A of semantic  cues from wife. HEP successfully completed as written  on to-do list. Signs for reminders were put up in bathroom and were proven to be helpful to recall donning hearing aids. Led through [employer] pay worksheet with min-A and suggestions to be specific about due dates/paid as well as labeling specific month. Discussed continual need for speech therapy, as pt and wife reporting continued concerns of pt stammering/stuttering d/t anomia and frustration with communication breakdowns. Provided education and instruction of recommendations to optimize communication, including intentional physchological sigh to reduce frustration, preparation for certain stressful communication scenarios (appointments and telephone calls) with scripting and writing down questions beforehand, and self-advocacy re: brain injury in conversation to request additional time, repetition, and written aids. Both patient and wife verbalized understanding and appreciation for recommendations.    02-25-23: Educated patient and wife on recommendations to optimize attention, recall, and verbal expression for functional tasks at home. Wife reported recent improvements in communication. Continue to recommend use of external aids to manage to-do lists and written reminders, with organization management provided today. Provided modeling and cues for wife to assist patient at home. See patient instructions for more details.   02-18-23: Prompted discussion re: recent hospitalization with intermittent min-mod A from wife required for word retrieval and recall. Benefited from extra processing time. Wife reported pt with increased fatigue when completing household tasks (MD planning to refer for PT). Engaged patient in discussion re: safety awareness and problem solving to ID current cognitive/physical limitations and pre-planning to account for deficits. Suggested discussing his plan aloud with his wife prior to initiating task. Provided education re:  using visual aids to optimize attention and recall (ex: don hearing aids) as pt needs usual reminders at home. Continued to recommend wife assistance with medication and bills d/t reduced error awareness, inattention, and recall.   5-17-24[name] attended session today. Pt accurately recalled MD name from last session, indicating success of Spaced Retrieval therapy technique. Again, re-iterated ability to use this intervention with impaired recall of other functional information. Pt able to appropriately locate memory "cheat sheet" but no new additions included. [name] endorsed now assisting with medications d/t MD questioning medication management at last appointment. Re-educated previous SLP recommendation for wife assisting with medicines d/t pt exhibiting reduced error awareness and attention/memory deficits. Suggested scaffolding assistance during functional tasks at home, including supervision first and increasing wife assistance as needed. Current medication and financial systems to be re-assessed this weekend with wife supervision, given pt has been managing independently with visual aids. Plan to target some online bill pay and financial management scenarios next session.   02-11-23: Attempted to retell recent story related to recalling medical information but unable to recall MD name. Did successfully request written information to aid processing and recall as recommended. Cued use of visual memory support, which was effective. Utilized Office manager Therapy to target recall of MD name, in which pt able to recall targeted response up to 16 minute interval indicating increased ability to retain targeted information. Unable to recall relevant family information (grandchildren names); therefore, SLP prompted use of visual support (family tree) to aid recall. SLP prompted sentence completion was helpful to recall one granddaughter. Continued to recommend writing down relevant information as needed.    02-06-23: Wife attended session today. Provided education of clinical observations and targeted ST interventions related to ongoing cognitive and communication challenges. Conducted caregiver education re: recommended techniques to optimize recall/attention in conversation (writing notes, internal rehearsal) as well as word finding (cueing hierarchy) to reduce vague speech. Wife verbalized understanding and appreciation for  recommendations. Today, targeted use of note writing to aid attention and recall in conversation. Pt able to completed with some intermittent mod A to focus on key words versus sentences.   02-04-23: Continues to come alone despite recommendation for wife to attend ST. No additional information added to "memory cheat sheet" other than PCP name from last session. Dicussed recent PCP appointment with reduced recall noted. Addressed mentioned concerns pertinent to ST, including memory loss (I.e., forgetting medications) and overall reduced awareness. Educated and instructed pertinent cognitive compensations and recommendations to aid completion of iADLs (recommending wife supervision and assistance with meds) and recall of functional information. Targeted writing down information given auditory stimuli (voicemails), with frequent repetition required to aid accuracy of recall. Recertification completed for remaining visits with LTGs downgraded d/t persistent cognitive challenges despite SLP intervention, which may be indicative of underlying memory/cognitive impairment.   01-28-23: Errors noted on homework despite explicit written instructions to optimize descriptions for word retrieval. Pt continues to use one word or vague response to describe item despite repetitive instruction and practice. Modeling and re-instruction was ineffective so provided alternate strategy for synonyms for word retrieval. Occasional mod A required to ID appropriate synonyms on structured task. Endorsed difficulty  recalling name of new PCP. SLP generated written aid "memory cheat sheet" to write down pertinent information that pt has difficulty consistently recalling. Reported good carryover of to-do lists and pre-planning with wife as previously instructed. Suggested wife participate in therapy sessions to aid carryover and provide caregiver education.    01-26-23: Reviewed homework to use description strategy for word retrieval, with noted difficulty understanding and implementing compensation. SLP re-educated rationale and structured techniques to optimize describing ability (SFA, written prompts, modeling) to aid word finding. Benefited from usual fading to occasional modeling and verbal cues to optimize specificity of descriptions. Usual extra processing time was beneficial for word retrieval and comprehension.   01-21-23: Endorsed difficulty alternating attention to transfer information between printed calendar and personal home calendar. Initial max A required to orient to today's date; recommended use of phone to aid orientation. Good carryover of phone exhibited throughout session when cued. Targeted transferring information from one written calendar to another with usual fading to occasional max A required to complete task. Overt errors noted x3 which pt did not ID even with cued double check. Given ongoing overt difficulty managing calendar due to impaired attention, recall, and awareness, recommended increased assistance from wife as pt unable to carryover trained techniques independently. Reported some use of trained description strategy to aid anomia with less frustration reported. Reviewed descriptions written for homework with usual max cues required to increase specificity. Updated HEP to optimize descriptions for additional practice.  01-12-23: Returned after nearly 1 month d/t awaiting auth for additional ST visits. In verbal recap of last month, pt reported he "flunked" driving test which resulted in  revoking of driver's license. Also reported missing several hearing aid appointments due to life situations. Endorsed good carryover of calendar and memory supports to aid recall, with success indicated. Cued recall of recent ED event, in which pt endorsed possible psychosis. Some difficulty with thought organization and verbal expression noted today in conversation. Conversational speech c/b intermittent vague language and occasional anomia. Pt stated "there are some words I can't get out." Targeted word retrieval strategies and SFA this session to optimize communication effectiveness. Usual mod A required to aid comprehension and carryover.   12-19-22: Able to demo improved good problem solving, in which pt identified appropriate hearing clinic  with appointment scheduled. Cued patient to write appointment on calendar. Min A required to ID correct date (19th vs 29th). Targeted problem solving to determine if this clinic accepts his insurance. Prompted using computer to investigate. Intermittent min cues required to maintain attention to targeted subject. Educated and instructed attention/processing/comprehension strategies to aid patient participation in conversation. Identified strategies x4 to implement at home with wife to repair communication breakdown. Due to time constraints, word finding strategies to be educated and trained next session.    12-17-22: Pt has demonstrated increased ability to manage medications more effectively with revision of updated personal medication list and double checking per SLP recommendations. Reported progress with memory but endorsed difficulty in conversation, particularly over the phone. Deduced difficulty stems from hearing impairment and auditory processing/comprehension. Targeted functional problem solving to address hearing impairment with use of visual aid. With usual prompting to deduce possible solutions, pt able to ID best solution (see audiologist). Pt able to  sequence appropriate next steps with usual min A. SLP initiated education and instruction of attention/auditory comprehension strategies to aid attention, recall, and understanding in conversation. Pt would benefit from additional training to aid receptive and expressive language as pt noted with intermittent difficulty with word retrieval and answering SLP questions in conversation today.   PATIENT EDUCATION: Education details: see above Person educated: Patient Education method: Programmer, multimedia, Demonstration, Verbal cues, and Handouts Education comprehension: verbalized understanding, returned demonstration, and needs further education   GOALS: Goals reviewed with patient? Yes  SHORT TERM GOALS: Target date: 12/05/2022  Complete CLQT and PROM first 1-2 sessions with thorough discussion of results to heighten pt awareness  Baseline:  Goal status: MET  2.  Pt will follow multi-step instructions to complete functional task with ~90% accuracy given occasional min A for 2/2 opportunities  Baseline: 11-28-22 Goal status: PARTIALLY MET  3.  Pt will demonstrate increased attention to detail on structured iADL tasks with 3 or less errors exhibited given occasional min A  Baseline: 12/05/22 Goal status: PARTIALLY MET  4.  Pt will demonstrate increased safety awareness by verbalizing problem and solution prior to initiating task for 2/2 opportunities  Baseline: 11-19-22 Goal status: PARTIALLY MET  5.   Pt will utilize processing/attention strategies in short structured conversations to complete entirety of thought for 80% of messages given occasional min A  Baseline: 11-19-22, 11-21-22 Goal status: MET  LONG TERM GOALS: Target date: 04/27/23 (new re-cert date)  Pt will successfully manage iADLs (medications, finances, appointments) with use of memory aids and compensations given supervision from wife Baseline:  Goal status: MET   2.  Pt will demonstrate improved safety awareness and attention to  detail with less frequent A from wife to ID safety concerns and complete functional tasks given supervision from wife Baseline:  Goal status: MET   3.  Pt will utilize processing/attention strategies in extended unstructured conversations to complete entirety of thoughts for ~90% of messages given intermittent min A x2 sessions Baseline:  Goal status: REVISED (ongoing at recert)  4.  Pt will demonstrate [facility] verbal expression with ability to compensate for anomia/dysnomia with trained strategy given occasional min A in 30+ minute conversations x2 sessions Baseline:  Goal status: REVISED (ongoing at recert)   ASSESSMENT:  CLINICAL IMPRESSION: Patient is a 77 y.o. male who was seen today for cognitive linguistic rehabilitation following SDH from fall in July 2023. Continues to present with slow but steady improvements in cognition (memory, attention, executive functioning) and communication (expressive and receptive language) impacting his  ability to independently manage his affairs and converse effectively. Conducting ongoing education and training with patient to aid communication and cognitive functioning for increased carryover at home. Given functional impact and increased frustration for patient and wife, pt would benefit from skilled ST intervention to optimize cognitive functioning to maximize return to prior baseline.   OBJECTIVE IMPAIRMENTS: include attention, memory, awareness, and executive functioning. These impairments are limiting patient from managing medications, managing finances, and household responsibilities. Factors affecting potential to achieve goals and functional outcome are ability to learn/carryover information. Patient will benefit from skilled SLP services to address above impairments and improve overall function.  REHAB POTENTIAL: Good  PLAN:  SLP FREQUENCY: 2x/week  SLP DURATION: 12 weeks ( + 6 weeks for recert)  PLANNED INTERVENTIONS: Language facilitation,  Environmental controls, Cueing hierachy, Cognitive reorganization, Internal/external aids, Functional tasks, Multimodal communication approach, SLP instruction and feedback, Compensatory strategies, and Patient/family education    Gracy Racer, CCC-SLP 03/16/2023, 1:19 PM

## 2023-03-23 ENCOUNTER — Other Ambulatory Visit: Payer: Self-pay | Admitting: Cardiology

## 2023-03-25 ENCOUNTER — Ambulatory Visit: Payer: Worker's Compensation

## 2023-03-25 DIAGNOSIS — R41841 Cognitive communication deficit: Secondary | ICD-10-CM | POA: Diagnosis not present

## 2023-03-25 NOTE — Therapy (Signed)
OUTPATIENT SPEECH LANGUAGE PATHOLOGY TREATMENT  Patient Name: Evan [name] MRN: [id number] DOB:April 25, 1946, 77 y.o., male Today's Date: 03/25/2023  PCP: Marden Noble MD REFERRING PROVIDER: Tia Alert MD  END OF SESSION:  End of Session - 03/25/23 0932     Visit Number 26    Number of Visits 33    Date for SLP Re-Evaluation 04/27/23    Authorization Type [employer]    Authorization Time Period 8 additional ST visits approved 03/24/23    Authorization - Visit Number 1    Authorization - Number of Visits 8    SLP Start Time 0934    SLP Stop Time  1015    SLP Time Calculation (min) 41 min    Activity Tolerance Patient tolerated treatment well                    Past Medical History:  Diagnosis Date   Acquired dilation of ascending aorta and aortic root (HCC)    a.) measurements by TTE in 04/2021 --> root 40 mm and ascending aorta 43 mm   Anginal pain (HCC)    Anxiety    Atrial flutter (HCC) 04/22/2021   a.) CHADS2VASc = 5 (age x 2, HTN, T2DM, CAD); b.) daily warfarin   Carotid disease, bilateral (HCC)    a.) Doppler in 2019 --> 1-39% stenosis on RIGHT and 40-58% on LEFT   Cataracts, bilateral    Chronic anticoagulation    Warfarin   Chronic renal insufficiency    Coronary artery disease    a.) s/p 3v CABG in 1999   Depression    GERD (gastroesophageal reflux disease)    History of kidney stones    Hypercholesteremia    Hypertension    Kidney stone    Low testosterone    on TRT injections   Malignant melanoma (HCC)    a.) face, nose, left leg; b.) Tx'd with chemotherapy + XRT at [facility]   Myocardial infarction [facility])    Pulmonary emboli (HCC)    S/P CABG x 3 1999   a.) LIMA-LAD, SVG-RCA, SVG-diagnonal   Sleep apnea    not using nocturnal PAP therapy   T2DM (type 2 diabetes mellitus) (HCC)    Past Surgical History:  Procedure Laterality Date   CARDIAC CATHETERIZATION     x3     last 1999   CARDIOVERSION N/A 08/12/2021    Procedure: CARDIOVERSION;  Surgeon: Pricilla Riffle, MD;  Location: [facility] ENDOSCOPY;  Service: Cardiovascular;  Laterality: N/A;   CATARACT EXTRACTION Bilateral    COLONOSCOPY WITH PROPOFOL N/A 09/26/2014   Procedure: COLONOSCOPY WITH PROPOFOL;  Surgeon: Charolett Bumpers, MD;  Location: [facility] ENDOSCOPY;  Service: Endoscopy;  Laterality: N/A;   CORONARY ARTERY BYPASS GRAFT N/A 1999   3v; LIMA-LAD, SVG-RCA, SVG-diagonal   HOLEP-LASER ENUCLEATION OF THE PROSTATE WITH MORCELLATION N/A 06/07/2021   Procedure: HOLEP-LASER ENUCLEATION OF THE PROSTATE WITH MORCELLATION;  Surgeon: Sondra Come, MD;  Location: [facility] ORS;  Service: Urology;  Laterality: N/A;   KNEE ARTHROSCOPY Left    LAPAROSCOPIC CHOLECYSTECTOMY     MASS EXCISION  04/15/2012   Procedure: EXCISION MASS;  Surgeon: Suzanna Obey, MD;  Location: [facility] OR;  Service: ENT;  Laterality: Left;  Left tonsil biopsy   melanomia     several skin ca removed-nose, left ankle, parotid gland left, right nodes-being followed annually..   Patient Active Problem List   Diagnosis Date Noted   Hypoglycemia 02/14/2023   SDH (  subdural hematoma) (HCC) 04/11/2022   Fall (on)(from) sidewalk curb, initial encounter 04/11/2022   History of melanoma excision 04/11/2022   Diabetes mellitus type 2 in obese 04/11/2022   DNR (do not resuscitate) 04/11/2022   Chronic anticoagulation 06/30/2021   Typical atrial flutter (HCC) 06/28/2021   Pure hypercholesterolemia 12/23/2013   Carotid artery disease (HCC) 12/23/2013   Essential hypertension, benign 12/23/2013   Angina decubitus 12/23/2013   Coronary artery disease    Cancer (HCC)    Depression    Pulmonary emboli (HCC)    Hypercholesteremia    Kidney stone    Chronic renal insufficiency     ONSET DATE: 04/11/2022 (referral=11/06/22)  REFERRING DIAG: S06.5XAA- Traumatic subdural hemorrhage with loss of consciousness   THERAPY DIAG: Cognitive communication deficit  Rationale for Evaluation and Treatment:  Rehabilitation  SUBJECTIVE:   SUBJECTIVE STATEMENT: "not good, things are going backwards." Pt accompanied by: self  PERTINENT HISTORY: 77 y.o. male presents to [facility] hospital on 7/14 after falling. Pt found to have small parafalcine SDH and right tentorial SDH , reporting nausea, HA, and dizziness. PMH:  includes anxiety, aflutter, depression, HTN, PE, CABG, DMII.   PAIN: Are you having pain? No  FALLS: Has patient fallen in last 6 months?  Yes, Number of falls: 3-4 (tripping)  PATIENT GOALS: "I want to be able to figure things out on my own. I want to confidently talk with people and not feel like I have to run and hide "   OBJECTIVE:   TODAY'S TREATMENT:                                                                                                                                         03-25-23: Patient states that a lot of things is going on in his life, and that it limits his thinking. Demonstrated difficulty word finding during unstructured conversation. Often able to ID targeted word with additional time or cue to aid attention to topic. Required occasional min-A of gestural and semantic cues. SLP provided education on creating plan to address issues as they come up. ST targeted communication in light attention and memory deficits through completing stories from story starters.  Patient required initial model, frequent guided questions, and visual aid of framework of story telling and feedback to improve responses. Patient's rating of responses matched SLP judgement with pt demonstrating awareness of reduced attention and tangential comments. Of note, at conclusion of SLP session, pt reported safety concern of using ladder to change light bulb while alone at home despite intellectual awareness of balance deficits and heightened fall risk.   03-16-23: Patient and SLP discusses miscommunication with previous two missed appointments. Patient advocated for himself and asked the front desk to  write down his appointments for him. Patient has added names to his cheat sheet to find names of people who he has forgotten. SLP led through putting labels for each  parson to for clarity, and to add notes for clarity. Patient states that [name] is managing his pills and that he feels very safe with the system that they have set up at home. SLP reviewed goals with patient and updated progress towards goals. Education provided on speech therapy in addressing word finding, memory, attention, and other cognitive tasks. Discussed medical history/medication to add to create a separate cheat sheet. Patient sent home to complete medical cheat sheet.   02-27-23: Patient missed earlier PT evaluation as both pt and wife stated that they did not know of the appointment. Patient became frustrated in discussion of recent medical information, as pt unable to recall MD name.  Both wife and SLP cued use of  written memory cheat sheet as pt unable to recall with max-A of semantic cues from wife. HEP successfully completed as written on to-do list. Signs for reminders were put up in bathroom and were proven to be helpful to recall donning hearing aids. Led through [employer] pay worksheet with min-A and suggestions to be specific about due dates/paid as well as labeling specific month. Discussed continual need for speech therapy, as pt and wife reporting continued concerns of pt stammering/stuttering d/t anomia and frustration with communication breakdowns. Provided education and instruction of recommendations to optimize communication, including intentional physchological sigh to reduce frustration, preparation for certain stressful communication scenarios (appointments and telephone calls) with scripting and writing down questions beforehand, and self-advocacy re: brain injury in conversation to request additional time, repetition, and written aids. Both patient and wife verbalized understanding and appreciation for  recommendations.    02-25-23: Educated patient and wife on recommendations to optimize attention, recall, and verbal expression for functional tasks at home. Wife reported recent improvements in communication. Continue to recommend use of external aids to manage to-do lists and written reminders, with organization management provided today. Provided modeling and cues for wife to assist patient at home. See patient instructions for more details.   02-18-23: Prompted discussion re: recent hospitalization with intermittent min-mod A from wife required for word retrieval and recall. Benefited from extra processing time. Wife reported pt with increased fatigue when completing household tasks (MD planning to refer for PT). Engaged patient in discussion re: safety awareness and problem solving to ID current cognitive/physical limitations and pre-planning to account for deficits. Suggested discussing his plan aloud with his wife prior to initiating task. Provided education re: using visual aids to optimize attention and recall (ex: don hearing aids) as pt needs usual reminders at home. Continued to recommend wife assistance with medication and bills d/t reduced error awareness, inattention, and recall.   5-17-24[name] attended session today. Pt accurately recalled MD name from last session, indicating success of Spaced Retrieval therapy technique. Again, re-iterated ability to use this intervention with impaired recall of other functional information. Pt able to appropriately locate memory "cheat sheet" but no new additions included. [name] endorsed now assisting with medications d/t MD questioning medication management at last appointment. Re-educated previous SLP recommendation for wife assisting with medicines d/t pt exhibiting reduced error awareness and attention/memory deficits. Suggested scaffolding assistance during functional tasks at home, including supervision first and increasing wife assistance as needed.  Current medication and financial systems to be re-assessed this weekend with wife supervision, given pt has been managing independently with visual aids. Plan to target some online bill pay and financial management scenarios next session.   02-11-23: Attempted to retell recent story related to recalling medical information but unable to recall MD  name. Did successfully request written information to aid processing and recall as recommended. Cued use of visual memory support, which was effective. Utilized Office manager Therapy to target recall of MD name, in which pt able to recall targeted response up to 16 minute interval indicating increased ability to retain targeted information. Unable to recall relevant family information (grandchildren names); therefore, SLP prompted use of visual support (family tree) to aid recall. SLP prompted sentence completion was helpful to recall one granddaughter. Continued to recommend writing down relevant information as needed.   02-06-23: Wife attended session today. Provided education of clinical observations and targeted ST interventions related to ongoing cognitive and communication challenges. Conducted caregiver education re: recommended techniques to optimize recall/attention in conversation (writing notes, internal rehearsal) as well as word finding (cueing hierarchy) to reduce vague speech. Wife verbalized understanding and appreciation for recommendations. Today, targeted use of note writing to aid attention and recall in conversation. Pt able to completed with some intermittent mod A to focus on key words versus sentences.   02-04-23: Continues to come alone despite recommendation for wife to attend ST. No additional information added to "memory cheat sheet" other than PCP name from last session. Dicussed recent PCP appointment with reduced recall noted. Addressed mentioned concerns pertinent to ST, including memory loss (I.e., forgetting medications) and overall  reduced awareness. Educated and instructed pertinent cognitive compensations and recommendations to aid completion of iADLs (recommending wife supervision and assistance with meds) and recall of functional information. Targeted writing down information given auditory stimuli (voicemails), with frequent repetition required to aid accuracy of recall. Recertification completed for remaining visits with LTGs downgraded d/t persistent cognitive challenges despite SLP intervention, which may be indicative of underlying memory/cognitive impairment.   01-28-23: Errors noted on homework despite explicit written instructions to optimize descriptions for word retrieval. Pt continues to use one word or vague response to describe item despite repetitive instruction and practice. Modeling and re-instruction was ineffective so provided alternate strategy for synonyms for word retrieval. Occasional mod A required to ID appropriate synonyms on structured task. Endorsed difficulty recalling name of new PCP. SLP generated written aid "memory cheat sheet" to write down pertinent information that pt has difficulty consistently recalling. Reported good carryover of to-do lists and pre-planning with wife as previously instructed. Suggested wife participate in therapy sessions to aid carryover and provide caregiver education.    01-26-23: Reviewed homework to use description strategy for word retrieval, with noted difficulty understanding and implementing compensation. SLP re-educated rationale and structured techniques to optimize describing ability (SFA, written prompts, modeling) to aid word finding. Benefited from usual fading to occasional modeling and verbal cues to optimize specificity of descriptions. Usual extra processing time was beneficial for word retrieval and comprehension.   01-21-23: Endorsed difficulty alternating attention to transfer information between printed calendar and personal home calendar. Initial max A  required to orient to today's date; recommended use of phone to aid orientation. Good carryover of phone exhibited throughout session when cued. Targeted transferring information from one written calendar to another with usual fading to occasional max A required to complete task. Overt errors noted x3 which pt did not ID even with cued double check. Given ongoing overt difficulty managing calendar due to impaired attention, recall, and awareness, recommended increased assistance from wife as pt unable to carryover trained techniques independently. Reported some use of trained description strategy to aid anomia with less frustration reported. Reviewed descriptions written for homework with usual max cues required to increase specificity. Updated  HEP to optimize descriptions for additional practice.  01-12-23: Returned after nearly 1 month d/t awaiting auth for additional ST visits. In verbal recap of last month, pt reported he "flunked" driving test which resulted in revoking of driver's license. Also reported missing several hearing aid appointments due to life situations. Endorsed good carryover of calendar and memory supports to aid recall, with success indicated. Cued recall of recent ED event, in which pt endorsed possible psychosis. Some difficulty with thought organization and verbal expression noted today in conversation. Conversational speech c/b intermittent vague language and occasional anomia. Pt stated "there are some words I can't get out." Targeted word retrieval strategies and SFA this session to optimize communication effectiveness. Usual mod A required to aid comprehension and carryover.   12-19-22: Able to demo improved good problem solving, in which pt identified appropriate hearing clinic with appointment scheduled. Cued patient to write appointment on calendar. Min A required to ID correct date (19th vs 29th). Targeted problem solving to determine if this clinic accepts his insurance. Prompted  using computer to investigate. Intermittent min cues required to maintain attention to targeted subject. Educated and instructed attention/processing/comprehension strategies to aid patient participation in conversation. Identified strategies x4 to implement at home with wife to repair communication breakdown. Due to time constraints, word finding strategies to be educated and trained next session.    12-17-22: Pt has demonstrated increased ability to manage medications more effectively with revision of updated personal medication list and double checking per SLP recommendations. Reported progress with memory but endorsed difficulty in conversation, particularly over the phone. Deduced difficulty stems from hearing impairment and auditory processing/comprehension. Targeted functional problem solving to address hearing impairment with use of visual aid. With usual prompting to deduce possible solutions, pt able to ID best solution (see audiologist). Pt able to sequence appropriate next steps with usual min A. SLP initiated education and instruction of attention/auditory comprehension strategies to aid attention, recall, and understanding in conversation. Pt would benefit from additional training to aid receptive and expressive language as pt noted with intermittent difficulty with word retrieval and answering SLP questions in conversation today.   PATIENT EDUCATION: Education details: see above Person educated: Patient Education method: Programmer, multimedia, Demonstration, Verbal cues, and Handouts Education comprehension: verbalized understanding, returned demonstration, and needs further education   GOALS: Goals reviewed with patient? Yes  SHORT TERM GOALS: Target date: 12/05/2022  Complete CLQT and PROM first 1-2 sessions with thorough discussion of results to heighten pt awareness  Baseline:  Goal status: MET  2.  Pt will follow multi-step instructions to complete functional task with ~90% accuracy given  occasional min A for 2/2 opportunities  Baseline: 11-28-22 Goal status: PARTIALLY MET  3.  Pt will demonstrate increased attention to detail on structured iADL tasks with 3 or less errors exhibited given occasional min A  Baseline: 12/05/22 Goal status: PARTIALLY MET  4.  Pt will demonstrate increased safety awareness by verbalizing problem and solution prior to initiating task for 2/2 opportunities  Baseline: 11-19-22 Goal status: PARTIALLY MET  5.   Pt will utilize processing/attention strategies in short structured conversations to complete entirety of thought for 80% of messages given occasional min A  Baseline: 11-19-22, 11-21-22 Goal status: MET  LONG TERM GOALS: Target date: 04/27/23 (new re-cert date)  Pt will successfully manage iADLs (medications, finances, appointments) with use of memory aids and compensations given supervision from wife Baseline:  Goal status: MET   2.  Pt will demonstrate improved safety awareness and attention  to detail with less frequent A from wife to ID safety concerns and complete functional tasks given supervision from wife Baseline:  Goal status: MET   3.  Pt will utilize processing/attention strategies in extended unstructured conversations to complete entirety of thoughts for ~90% of messages given intermittent min A x2 sessions Baseline:  Goal status: IN PROGRESS (ongoing at recert)  4.  Pt will demonstrate [facility] verbal expression with ability to compensate for anomia/dysnomia with trained strategy given occasional min A in 30+ minute conversations x2 sessions Baseline:  Goal status: IN PROGRESS (ongoing at recert)   ASSESSMENT:  CLINICAL IMPRESSION: Patient is a 77 y.o. male who was seen today for cognitive linguistic rehabilitation following SDH from fall in July 2023. Presents with inconsistent improvements in cognition (memory, attention, executive functioning) and communication (expressive and receptive language) impacting his ability to  independently manage his affairs and converse effectively. Conducting ongoing education and training with patient to aid communication and cognitive functioning for increased carryover at home. Given functional impact and increased frustration for patient and wife, pt would benefit from skilled ST intervention to optimize cognitive functioning to maximize return to prior baseline.   OBJECTIVE IMPAIRMENTS: include attention, memory, awareness, and executive functioning. These impairments are limiting patient from managing medications, managing finances, and household responsibilities. Factors affecting potential to achieve goals and functional outcome are ability to learn/carryover information. Patient will benefit from skilled SLP services to address above impairments and improve overall function.  REHAB POTENTIAL: Good  PLAN:  SLP FREQUENCY: 2x/week  SLP DURATION: 12 weeks ( + 6 weeks for recert)  PLANNED INTERVENTIONS: Language facilitation, Environmental controls, Cueing hierachy, Cognitive reorganization, Internal/external aids, Functional tasks, Multimodal communication approach, SLP instruction and feedback, Compensatory strategies, and Patient/family education    Gracy Racer, CCC-SLP 03/25/2023, 10:39 AM

## 2023-03-31 ENCOUNTER — Ambulatory Visit: Payer: Medicare PPO | Admitting: Cardiology

## 2023-03-31 DIAGNOSIS — F0781 Postconcussional syndrome: Secondary | ICD-10-CM | POA: Diagnosis not present

## 2023-03-31 DIAGNOSIS — R4189 Other symptoms and signs involving cognitive functions and awareness: Secondary | ICD-10-CM | POA: Diagnosis not present

## 2023-03-31 DIAGNOSIS — E1142 Type 2 diabetes mellitus with diabetic polyneuropathy: Secondary | ICD-10-CM | POA: Diagnosis not present

## 2023-03-31 NOTE — Therapy (Unsigned)
OUTPATIENT SPEECH LANGUAGE PATHOLOGY TREATMENT  Patient Name: Evan [name] MRN: [id number] DOB:[birth year], 77 y.o., male Today's Date: 04/01/2023  PCP: Marden Noble MD REFERRING PROVIDER: Tia Alert MD  END OF SESSION:  End of Session - 04/01/23 0936     Visit Number 27    Number of Visits 33    Date for SLP Re-Evaluation 04/27/23    Authorization Type [employer]    Authorization Time Period 8 additional ST visits approved 03/24/23    Authorization - Visit Number 2    Authorization - Number of Visits 8    SLP Start Time 0935    SLP Stop Time  1017    SLP Time Calculation (min) 42 min    Activity Tolerance Patient tolerated treatment well                     Past Medical History:  Diagnosis Date   Acquired dilation of ascending aorta and aortic root (HCC)    a.) measurements by TTE in 04/2021 --> root 40 mm and ascending aorta 43 mm   Anginal pain (HCC)    Anxiety    Atrial flutter (HCC) 04/22/2021   a.) CHADS2VASc = 5 (age x 2, HTN, T2DM, CAD); b.) daily warfarin   Carotid disease, bilateral (HCC)    a.) Doppler in 2019 --> 1-39% stenosis on RIGHT and 40-58% on LEFT   Cataracts, bilateral    Chronic anticoagulation    Warfarin   Chronic renal insufficiency    Coronary artery disease    a.) s/p 3v CABG in 1999   Depression    GERD (gastroesophageal reflux disease)    History of kidney stones    Hypercholesteremia    Hypertension    Kidney stone    Low testosterone    on TRT injections   Malignant melanoma (HCC)    a.) face, nose, left leg; b.) Tx'd with chemotherapy + XRT at [facility]   Myocardial infarction [facility])    Pulmonary emboli (HCC)    S/P CABG x 3 1999   a.) LIMA-LAD, SVG-RCA, SVG-diagnonal   Sleep apnea    not using nocturnal PAP therapy   T2DM (type 2 diabetes mellitus) (HCC)    Past Surgical History:  Procedure Laterality Date   CARDIAC CATHETERIZATION     x3     last 1999   CARDIOVERSION N/A 08/12/2021    Procedure: CARDIOVERSION;  Surgeon: Pricilla Riffle, MD;  Location[facility] ENDOSCOPY;  Service: Cardiovascular;  Laterality: N/A;   CATARACT EXTRACTION Bilateral    COLONOSCOPY WITH PROPOFOL N/A 09/26/2014   Procedure: COLONOSCOPY WITH PROPOFOL;  Surgeon: Charolett Bumpers, MD;  Location: [facility] ENDOSCOPY;  Service: Endoscopy;  Laterality: N/A;   CORONARY ARTERY BYPASS GRAFT N/A 1999   3v; LIMA-LAD, SVG-RCA, SVG-diagonal   HOLEP-LASER ENUCLEATION OF THE PROSTATE WITH MORCELLATION N/A 06/07/2021   Procedure: HOLEP-LASER ENUCLEATION OF THE PROSTATE WITH MORCELLATION;  Surgeon: Sondra Come, MD;  Location: [facility] ORS;  Service: Urology;  Laterality: N/A;   KNEE ARTHROSCOPY Left    LAPAROSCOPIC CHOLECYSTECTOMY     MASS EXCISION  04/15/2012   Procedure: EXCISION MASS;  Surgeon: Suzanna Obey, MD;  Location: [facility] OR;  Service: ENT;  Laterality: Left;  Left tonsil biopsy   melanomia     several skin ca removed-nose, left ankle, parotid gland left, right nodes-being followed annually..   Patient Active Problem List   Diagnosis Date Noted   Hypoglycemia 02/14/2023  SDH (subdural hematoma) (HCC) 04/11/2022   Fall (on)(from) sidewalk curb, initial encounter 04/11/2022   History of melanoma excision 04/11/2022   Diabetes mellitus type 2 in obese 04/11/2022   DNR (do not resuscitate) 04/11/2022   Chronic anticoagulation 06/30/2021   Typical atrial flutter (HCC) 06/28/2021   Pure hypercholesterolemia 12/23/2013   Carotid artery disease (HCC) 12/23/2013   Essential hypertension, benign 12/23/2013   Angina decubitus 12/23/2013   Coronary artery disease    Cancer (HCC)    Depression    Pulmonary emboli (HCC)    Hypercholesteremia    Kidney stone    Chronic renal insufficiency     ONSET DATE: 04/11/2022 (referral=11/06/22)  REFERRING DIAG: S06.5XAA- Traumatic subdural hemorrhage with loss of consciousness   THERAPY DIAG: Cognitive communication deficit  Rationale for Evaluation and Treatment:  Rehabilitation  SUBJECTIVE:   SUBJECTIVE STATEMENT: "I have to surrender them (hearing aids) today. They are a trial"  Pt accompanied by: self  PERTINENT HISTORY: 77 y.o. male presents to [facility] hospital on 7/14 after falling. Pt found to have small parafalcine SDH and right tentorial SDH , reporting nausea, HA, and dizziness. PMH:  includes anxiety, aflutter, depression, HTN, PE, CABG, DMII.   PAIN: Are you having pain? No  FALLS: Has patient fallen in last 6 months?  Yes, Number of falls: 3-4 (tripping)  PATIENT GOALS: "I want to be able to figure things out on my own. I want to confidently talk with people and not feel like I have to run and hide "   OBJECTIVE:   TODAY'S TREATMENT:                                                                                                                                         04-01-23: Returned with trial hearing aids in place, with pt reporting improvements in hearing acuity and some processing. Targeted thought organization, memory, and communication with functional note taking task. Frequent mod prompting required to aid thought organization and recall prior to writing as pt was quick to write down verbatim responses. Exhibited difficulty differentiating between relevant and irrelevant information and writing cohesive reminder. Usual mod to max A required to write specific, detailed reminder and follow step-by-step instructions. Notable difficulty observed with recalling targeted term despite repetition and written cue. Unable to find written targeted term on paper despite max A. Consistent prompting required for usual vague responses, in which additional processing time was occasionally beneficial. Briefly discussed ongoing cognitive linguistic challenges with wife.   03-25-23: Patient states that a lot of things is going on in his life, and that it limits his thinking. Demonstrated difficulty word finding during unstructured conversation. Often able to ID  targeted word with additional time or cue to aid attention to topic. Required occasional min-A of gestural and semantic cues. SLP provided education on creating plan to address issues as they come up. ST targeted communication in  light attention and memory deficits through completing stories from story starters.  Patient required initial model, frequent guided questions, and visual aid of framework of story telling and feedback to improve responses. Patient's rating of responses matched SLP judgement with pt demonstrating awareness of reduced attention and tangential comments. Of note, at conclusion of SLP session, pt reported safety concern of using ladder to change light bulb while alone at home despite intellectual awareness of balance deficits and heightened fall risk.   03-16-23: Patient and SLP discusses miscommunication with previous two missed appointments. Patient advocated for himself and asked the front desk to write down his appointments for him. Patient has added names to his cheat sheet to find names of people who he has forgotten. SLP led through putting labels for each parson to for clarity, and to add notes for clarity. Patient states that [name] is managing his pills and that he feels very safe with the system that they have set up at home. SLP reviewed goals with patient and updated progress towards goals. Education provided on speech therapy in addressing word finding, memory, attention, and other cognitive tasks. Discussed medical history/medication to add to create a separate cheat sheet. Patient sent home to complete medical cheat sheet.   02-27-23: Patient missed earlier PT evaluation as both pt and wife stated that they did not know of the appointment. Patient became frustrated in discussion of recent medical information, as pt unable to recall MD name.  Both wife and SLP cued use of  written memory cheat sheet as pt unable to recall with max-A of semantic cues from wife. HEP successfully  completed as written on to-do list. Signs for reminders were put up in bathroom and were proven to be helpful to recall donning hearing aids. Led through [employer] pay worksheet with min-A and suggestions to be specific about due dates/paid as well as labeling specific month. Discussed continual need for speech therapy, as pt and wife reporting continued concerns of pt stammering/stuttering d/t anomia and frustration with communication breakdowns. Provided education and instruction of recommendations to optimize communication, including intentional physchological sigh to reduce frustration, preparation for certain stressful communication scenarios (appointments and telephone calls) with scripting and writing down questions beforehand, and self-advocacy re: brain injury in conversation to request additional time, repetition, and written aids. Both patient and wife verbalized understanding and appreciation for recommendations.    02-25-23: Educated patient and wife on recommendations to optimize attention, recall, and verbal expression for functional tasks at home. Wife reported recent improvements in communication. Continue to recommend use of external aids to manage to-do lists and written reminders, with organization management provided today. Provided modeling and cues for wife to assist patient at home. See patient instructions for more details.   02-18-23: Prompted discussion re: recent hospitalization with intermittent min-mod A from wife required for word retrieval and recall. Benefited from extra processing time. Wife reported pt with increased fatigue when completing household tasks (MD planning to refer for PT). Engaged patient in discussion re: safety awareness and problem solving to ID current cognitive/physical limitations and pre-planning to account for deficits. Suggested discussing his plan aloud with his wife prior to initiating task. Provided education re: using visual aids to optimize  attention and recall (ex: don hearing aids) as pt needs usual reminders at home. Continued to recommend wife assistance with medication and bills d/t reduced error awareness, inattention, and recall.   5-17-24[name] attended session today. Pt accurately recalled MD name from last session, indicating success  of Spaced Retrieval therapy technique. Again, re-iterated ability to use this intervention with impaired recall of other functional information. Pt able to appropriately locate memory "cheat sheet" but no new additions included. [name] endorsed now assisting with medications d/t MD questioning medication management at last appointment. Re-educated previous SLP recommendation for wife assisting with medicines d/t pt exhibiting reduced error awareness and attention/memory deficits. Suggested scaffolding assistance during functional tasks at home, including supervision first and increasing wife assistance as needed. Current medication and financial systems to be re-assessed this weekend with wife supervision, given pt has been managing independently with visual aids. Plan to target some online bill pay and financial management scenarios next session.   02-11-23: Attempted to retell recent story related to recalling medical information but unable to recall MD name. Did successfully request written information to aid processing and recall as recommended. Cued use of visual memory support, which was effective. Utilized Office manager Therapy to target recall of MD name, in which pt able to recall targeted response up to 16 minute interval indicating increased ability to retain targeted information. Unable to recall relevant family information (grandchildren names); therefore, SLP prompted use of visual support (family tree) to aid recall. SLP prompted sentence completion was helpful to recall one granddaughter. Continued to recommend writing down relevant information as needed.   02-06-23: Wife attended session  today. Provided education of clinical observations and targeted ST interventions related to ongoing cognitive and communication challenges. Conducted caregiver education re: recommended techniques to optimize recall/attention in conversation (writing notes, internal rehearsal) as well as word finding (cueing hierarchy) to reduce vague speech. Wife verbalized understanding and appreciation for recommendations. Today, targeted use of note writing to aid attention and recall in conversation. Pt able to completed with some intermittent mod A to focus on key words versus sentences.   02-04-23: Continues to come alone despite recommendation for wife to attend ST. No additional information added to "memory cheat sheet" other than PCP name from last session. Dicussed recent PCP appointment with reduced recall noted. Addressed mentioned concerns pertinent to ST, including memory loss (I.e., forgetting medications) and overall reduced awareness. Educated and instructed pertinent cognitive compensations and recommendations to aid completion of iADLs (recommending wife supervision and assistance with meds) and recall of functional information. Targeted writing down information given auditory stimuli (voicemails), with frequent repetition required to aid accuracy of recall. Recertification completed for remaining visits with LTGs downgraded d/t persistent cognitive challenges despite SLP intervention, which may be indicative of underlying memory/cognitive impairment.   01-28-23: Errors noted on homework despite explicit written instructions to optimize descriptions for word retrieval. Pt continues to use one word or vague response to describe item despite repetitive instruction and practice. Modeling and re-instruction was ineffective so provided alternate strategy for synonyms for word retrieval. Occasional mod A required to ID appropriate synonyms on structured task. Endorsed difficulty recalling name of new PCP. SLP generated  written aid "memory cheat sheet" to write down pertinent information that pt has difficulty consistently recalling. Reported good carryover of to-do lists and pre-planning with wife as previously instructed. Suggested wife participate in therapy sessions to aid carryover and provide caregiver education.    01-26-23: Reviewed homework to use description strategy for word retrieval, with noted difficulty understanding and implementing compensation. SLP re-educated rationale and structured techniques to optimize describing ability (SFA, written prompts, modeling) to aid word finding. Benefited from usual fading to occasional modeling and verbal cues to optimize specificity of descriptions. Usual extra processing time was beneficial for  word retrieval and comprehension.   01-21-23: Endorsed difficulty alternating attention to transfer information between printed calendar and personal home calendar. Initial max A required to orient to today's date; recommended use of phone to aid orientation. Good carryover of phone exhibited throughout session when cued. Targeted transferring information from one written calendar to another with usual fading to occasional max A required to complete task. Overt errors noted x3 which pt did not ID even with cued double check. Given ongoing overt difficulty managing calendar due to impaired attention, recall, and awareness, recommended increased assistance from wife as pt unable to carryover trained techniques independently. Reported some use of trained description strategy to aid anomia with less frustration reported. Reviewed descriptions written for homework with usual max cues required to increase specificity. Updated HEP to optimize descriptions for additional practice.  01-12-23: Returned after nearly 1 month d/t awaiting auth for additional ST visits. In verbal recap of last month, pt reported he "flunked" driving test which resulted in revoking of driver's license. Also reported  missing several hearing aid appointments due to life situations. Endorsed good carryover of calendar and memory supports to aid recall, with success indicated. Cued recall of recent ED event, in which pt endorsed possible psychosis. Some difficulty with thought organization and verbal expression noted today in conversation. Conversational speech c/b intermittent vague language and occasional anomia. Pt stated "there are some words I can't get out." Targeted word retrieval strategies and SFA this session to optimize communication effectiveness. Usual mod A required to aid comprehension and carryover.   12-19-22: Able to demo improved good problem solving, in which pt identified appropriate hearing clinic with appointment scheduled. Cued patient to write appointment on calendar. Min A required to ID correct date (19th vs 29th). Targeted problem solving to determine if this clinic accepts his insurance. Prompted using computer to investigate. Intermittent min cues required to maintain attention to targeted subject. Educated and instructed attention/processing/comprehension strategies to aid patient participation in conversation. Identified strategies x4 to implement at home with wife to repair communication breakdown. Due to time constraints, word finding strategies to be educated and trained next session.    12-17-22: Pt has demonstrated increased ability to manage medications more effectively with revision of updated personal medication list and double checking per SLP recommendations. Reported progress with memory but endorsed difficulty in conversation, particularly over the phone. Deduced difficulty stems from hearing impairment and auditory processing/comprehension. Targeted functional problem solving to address hearing impairment with use of visual aid. With usual prompting to deduce possible solutions, pt able to ID best solution (see audiologist). Pt able to sequence appropriate next steps with usual min A.  SLP initiated education and instruction of attention/auditory comprehension strategies to aid attention, recall, and understanding in conversation. Pt would benefit from additional training to aid receptive and expressive language as pt noted with intermittent difficulty with word retrieval and answering SLP questions in conversation today.   PATIENT EDUCATION: Education details: see above Person educated: Patient Education method: Programmer, multimedia, Demonstration, Verbal cues, and Handouts Education comprehension: verbalized understanding, returned demonstration, and needs further education   GOALS: Goals reviewed with patient? Yes  SHORT TERM GOALS: Target date: 12/05/2022  Complete CLQT and PROM first 1-2 sessions with thorough discussion of results to heighten pt awareness  Baseline:  Goal status: MET  2.  Pt will follow multi-step instructions to complete functional task with ~90% accuracy given occasional min A for 2/2 opportunities  Baseline: 11-28-22 Goal status: PARTIALLY MET  3.  Pt will  demonstrate increased attention to detail on structured iADL tasks with 3 or less errors exhibited given occasional min A  Baseline: 12/05/22 Goal status: PARTIALLY MET  4.  Pt will demonstrate increased safety awareness by verbalizing problem and solution prior to initiating task for 2/2 opportunities  Baseline: 11-19-22 Goal status: PARTIALLY MET  5.   Pt will utilize processing/attention strategies in short structured conversations to complete entirety of thought for 80% of messages given occasional min A  Baseline: 11-19-22, 11-21-22 Goal status: MET  LONG TERM GOALS: Target date: 04/27/23 (new re-cert date)  Pt will successfully manage iADLs (medications, finances, appointments) with use of memory aids and compensations given supervision from wife Baseline:  Goal status: MET   2.  Pt will demonstrate improved safety awareness and attention to detail with less frequent A from wife to ID safety  concerns and complete functional tasks given supervision from wife Baseline:  Goal status: MET   3.  Pt will utilize processing/attention strategies in extended unstructured conversations to complete entirety of thoughts for ~90% of messages given intermittent min A x2 sessions Baseline:  Goal status: IN PROGRESS (ongoing at recert)  4.  Pt will demonstrate [facility] verbal expression with ability to compensate for anomia/dysnomia with trained strategy given occasional min A in 30+ minute conversations x2 sessions Baseline:  Goal status: IN PROGRESS (ongoing at recert)   ASSESSMENT:  CLINICAL IMPRESSION: Patient is a 77 y.o. male who was seen today for cognitive linguistic rehabilitation following SDH from fall in July 2023. Presents with inconsistent improvements in cognition (memory, attention, executive functioning) and communication (expressive and receptive language) impacting his ability to independently manage his affairs and converse effectively. Conducting ongoing education and training with patient and wife to aid communication and cognitive functioning for increased carryover at home. Given functional impact and increased frustration for patient and wife, pt would benefit from skilled ST intervention to optimize cognitive functioning to maximize return to prior baseline.   OBJECTIVE IMPAIRMENTS: include attention, memory, awareness, and executive functioning. These impairments are limiting patient from managing medications, managing finances, and household responsibilities. Factors affecting potential to achieve goals and functional outcome are ability to learn/carryover information. Patient will benefit from skilled SLP services to address above impairments and improve overall function.  REHAB POTENTIAL: Good  PLAN:  SLP FREQUENCY: 2x/week  SLP DURATION: 12 weeks ( + 6 weeks for recert)  PLANNED INTERVENTIONS: Language facilitation, Environmental controls, Cueing hierachy,  Cognitive reorganization, Internal/external aids, Functional tasks, Multimodal communication approach, SLP instruction and feedback, Compensatory strategies, and Patient/family education    Gracy Racer, CCC-SLP 04/01/2023, 11:18 AM

## 2023-04-01 ENCOUNTER — Ambulatory Visit: Payer: Worker's Compensation | Attending: Internal Medicine

## 2023-04-01 DIAGNOSIS — R41841 Cognitive communication deficit: Secondary | ICD-10-CM | POA: Diagnosis present

## 2023-04-01 NOTE — Patient Instructions (Addendum)
For appointment management, Do you have MyChart? If not, set one up with Linda's assistance  You should be able to see your upcoming appointments from multiple providers   Recommendations:  Memory:  Writing down information Gather your thoughts before you write them  Be specific when taking notes so that you will understand what is written when you go back to it  Communication: You are often finding the words WITH extra time. Make sure others are not injecting. Ask for extra time if needed

## 2023-04-06 DIAGNOSIS — I10 Essential (primary) hypertension: Secondary | ICD-10-CM | POA: Diagnosis not present

## 2023-04-06 DIAGNOSIS — Z7901 Long term (current) use of anticoagulants: Secondary | ICD-10-CM | POA: Diagnosis not present

## 2023-04-08 ENCOUNTER — Ambulatory Visit: Payer: Worker's Compensation | Attending: Internal Medicine

## 2023-04-08 DIAGNOSIS — R41841 Cognitive communication deficit: Secondary | ICD-10-CM | POA: Insufficient documentation

## 2023-04-08 NOTE — Therapy (Deleted)
OUTPATIENT SPEECH LANGUAGE PATHOLOGY TREATMENT  Patient Name: Evan [name] MRN: [id number] DOB:13-Oct-1945, 77 y.o., male Today's Date: 04/08/2023  PCP: Marden Noble MD REFERRING PROVIDER: Tia Alert MD  END OF SESSION:            Past Medical History:  Diagnosis Date   Acquired dilation of ascending aorta and aortic root (HCC)    a.) measurements by TTE in 04/2021 --> root 40 mm and ascending aorta 43 mm   Anginal pain (HCC)    Anxiety    Atrial flutter (HCC) 04/22/2021   a.) CHADS2VASc = 5 (age x 2, HTN, T2DM, CAD); b.) daily warfarin   Carotid disease, bilateral (HCC)    a.) Doppler in 2019 --> 1-39% stenosis on RIGHT and 40-58% on LEFT   Cataracts, bilateral    Chronic anticoagulation    Warfarin   Chronic renal insufficiency    Coronary artery disease    a.) s/p 3v CABG in 1999   Depression    GERD (gastroesophageal reflux disease)    History of kidney stones    Hypercholesteremia    Hypertension    Kidney stone    Low testosterone    on TRT injections   Malignant melanoma (HCC)    a.) face, nose, left leg; b.) Tx'd with chemotherapy + XRT at [facility]   Myocardial infarction [facility])    Pulmonary emboli (HCC)    S/P CABG x 3 1999   a.) LIMA-LAD, SVG-RCA, SVG-diagnonal   Sleep apnea    not using nocturnal PAP therapy   T2DM (type 2 diabetes mellitus) (HCC)    Past Surgical History:  Procedure Laterality Date   CARDIAC CATHETERIZATION     x3     last 1999   CARDIOVERSION N/A 08/12/2021   Procedure: CARDIOVERSION;  Surgeon: Pricilla Riffle, MD;  Location: [facility] ENDOSCOPY;  Service: Cardiovascular;  Laterality: N/A;   CATARACT EXTRACTION Bilateral    COLONOSCOPY WITH PROPOFOL N/A 09/26/2014   Procedure: COLONOSCOPY WITH PROPOFOL;  Surgeon: Charolett Bumpers, MD;  Location: [facility] ENDOSCOPY;  Service: Endoscopy;  Laterality: N/A;   CORONARY ARTERY BYPASS GRAFT N/A 1999   3v; LIMA-LAD, SVG-RCA, SVG-diagonal   HOLEP-LASER ENUCLEATION OF THE PROSTATE WITH  MORCELLATION N/A 06/07/2021   Procedure: HOLEP-LASER ENUCLEATION OF THE PROSTATE WITH MORCELLATION;  Surgeon: Sondra Come, MD;  Location: [facility] ORS;  Service: Urology;  Laterality: N/A;   KNEE ARTHROSCOPY Left    LAPAROSCOPIC CHOLECYSTECTOMY     MASS EXCISION  04/15/2012   Procedure: EXCISION MASS;  Surgeon: Suzanna Obey, MD;  Location: [facility] OR;  Service: ENT;  Laterality: Left;  Left tonsil biopsy   melanomia     several skin ca removed-nose, left ankle, parotid gland left, right nodes-being followed annually..   Patient Active Problem List   Diagnosis Date Noted   Hypoglycemia 02/14/2023   SDH (subdural hematoma) (HCC) 04/11/2022   Fall (on)(from) sidewalk curb, initial encounter 04/11/2022   History of melanoma excision 04/11/2022   Diabetes mellitus type 2 in obese 04/11/2022   DNR (do not resuscitate) 04/11/2022   Chronic anticoagulation 06/30/2021   Typical atrial flutter (HCC) 06/28/2021   Pure hypercholesterolemia 12/23/2013   Carotid artery disease (HCC) 12/23/2013   Essential hypertension, benign 12/23/2013   Angina decubitus 12/23/2013   Coronary artery disease    Cancer (HCC)    Depression    Pulmonary emboli (HCC)    Hypercholesteremia    Kidney stone    Chronic renal insufficiency  ONSET DATE: 04/11/2022 (referral=11/06/22)  REFERRING DIAG: S06.5XAA- Traumatic subdural hemorrhage with loss of consciousness   THERAPY DIAG: No diagnosis found.  Rationale for Evaluation and Treatment: Rehabilitation  SUBJECTIVE:   SUBJECTIVE STATEMENT: *** Pt accompanied by: self  PERTINENT HISTORY: 77 y.o. male presents to [facility] on 7/14 after falling. Pt found to have small parafalcine SDH and right tentorial SDH , reporting nausea, HA, and dizziness. PMH:  includes anxiety, aflutter, depression, HTN, PE, CABG, DMII.   PAIN: Are you having pain? No  FALLS: Has patient fallen in last 6 months?  Yes, Number of falls: 3-4 (tripping)  PATIENT GOALS: "I want to be able  to figure things out on my own. I want to confidently talk with people and not feel like I have to run and hide "   OBJECTIVE:   TODAY'S TREATMENT:                                                                                                                                         04-08-23: ***   04-01-23: Returned with trial hearing aids in place, with pt reporting improvements in hearing acuity and some processing. Targeted thought organization, memory, and communication with functional note taking task. Frequent mod prompting required to aid thought organization and recall prior to writing as pt was quick to write down verbatim responses. Exhibited difficulty differentiating between relevant and irrelevant information and writing cohesive reminder. Usual mod to max A required to write specific, detailed reminder and follow step-by-step instructions. Notable difficulty observed with recalling targeted term despite repetition and written cue. Unable to find written targeted term on paper despite max A. Consistent prompting required for usual vague responses, in which additional processing time was occasionally beneficial. Briefly discussed ongoing cognitive linguistic challenges with wife.   03-25-23: Patient states that a lot of things is going on in his life, and that it limits his thinking. Demonstrated difficulty word finding during unstructured conversation. Often able to ID targeted word with additional time or cue to aid attention to topic. Required occasional min-A of gestural and semantic cues. SLP provided education on creating plan to address issues as they come up. ST targeted communication in light attention and memory deficits through completing stories from story starters.  Patient required initial model, frequent guided questions, and visual aid of framework of story telling and feedback to improve responses. Patient's rating of responses matched SLP judgement with pt demonstrating awareness  of reduced attention and tangential comments. Of note, at conclusion of SLP session, pt reported safety concern of using ladder to change light bulb while alone at home despite intellectual awareness of balance deficits and heightened fall risk.   03-16-23: Patient and SLP discusses miscommunication with previous two missed appointments. Patient advocated for himself and asked the front desk to write down his appointments for him. Patient has added names to his cheat sheet to  find names of people who he has forgotten. SLP led through putting labels for each parson to for clarity, and to add notes for clarity. Patient states that [name] is managing his pills and that he feels very safe with the system that they have set up at home. SLP reviewed goals with patient and updated progress towards goals. Education provided on speech therapy in addressing word finding, memory, attention, and other cognitive tasks. Discussed medical history/medication to add to create a separate cheat sheet. Patient sent home to complete medical cheat sheet.   02-27-23: Patient missed earlier PT evaluation as both pt and wife stated that they did not know of the appointment. Patient became frustrated in discussion of recent medical information, as pt unable to recall MD name.  Both wife and SLP cued use of  written memory cheat sheet as pt unable to recall with max-A of semantic cues from wife. HEP successfully completed as written on to-do list. Signs for reminders were put up in bathroom and were proven to be helpful to recall donning hearing aids. Led through [employer] pay worksheet with min-A and suggestions to be specific about due dates/paid as well as labeling specific month. Discussed continual need for speech therapy, as pt and wife reporting continued concerns of pt stammering/stuttering d/t anomia and frustration with communication breakdowns. Provided education and instruction of recommendations to optimize communication,  including intentional physchological sigh to reduce frustration, preparation for certain stressful communication scenarios (appointments and telephone calls) with scripting and writing down questions beforehand, and self-advocacy re: brain injury in conversation to request additional time, repetition, and written aids. Both patient and wife verbalized understanding and appreciation for recommendations.    02-25-23: Educated patient and wife on recommendations to optimize attention, recall, and verbal expression for functional tasks at home. Wife reported recent improvements in communication. Continue to recommend use of external aids to manage to-do lists and written reminders, with organization management provided today. Provided modeling and cues for wife to assist patient at home. See patient instructions for more details.   02-18-23: Prompted discussion re: recent hospitalization with intermittent min-mod A from wife required for word retrieval and recall. Benefited from extra processing time. Wife reported pt with increased fatigue when completing household tasks (MD planning to refer for PT). Engaged patient in discussion re: safety awareness and problem solving to ID current cognitive/physical limitations and pre-planning to account for deficits. Suggested discussing his plan aloud with his wife prior to initiating task. Provided education re: using visual aids to optimize attention and recall (ex: don hearing aids) as pt needs usual reminders at home. Continued to recommend wife assistance with medication and bills d/t reduced error awareness, inattention, and recall.   5-17-24[name] attended session today. Pt accurately recalled MD name from last session, indicating success of Spaced Retrieval therapy technique. Again, re-iterated ability to use this intervention with impaired recall of other functional information. Pt able to appropriately locate memory "cheat sheet" but no new additions included.  [name] endorsed now assisting with medications d/t MD questioning medication management at last appointment. Re-educated previous SLP recommendation for wife assisting with medicines d/t pt exhibiting reduced error awareness and attention/memory deficits. Suggested scaffolding assistance during functional tasks at home, including supervision first and increasing wife assistance as needed. Current medication and financial systems to be re-assessed this weekend with wife supervision, given pt has been managing independently with visual aids. Plan to target some online bill pay and financial management scenarios next session.   02-11-23:  Attempted to retell recent story related to recalling medical information but unable to recall MD name. Did successfully request written information to aid processing and recall as recommended. Cued use of visual memory support, which was effective. Utilized Office manager Therapy to target recall of MD name, in which pt able to recall targeted response up to 16 minute interval indicating increased ability to retain targeted information. Unable to recall relevant family information (grandchildren names); therefore, SLP prompted use of visual support (family tree) to aid recall. SLP prompted sentence completion was helpful to recall one granddaughter. Continued to recommend writing down relevant information as needed.   02-06-23: Wife attended session today. Provided education of clinical observations and targeted ST interventions related to ongoing cognitive and communication challenges. Conducted caregiver education re: recommended techniques to optimize recall/attention in conversation (writing notes, internal rehearsal) as well as word finding (cueing hierarchy) to reduce vague speech. Wife verbalized understanding and appreciation for recommendations. Today, targeted use of note writing to aid attention and recall in conversation. Pt able to completed with some intermittent mod  A to focus on key words versus sentences.   02-04-23: Continues to come alone despite recommendation for wife to attend ST. No additional information added to "memory cheat sheet" other than PCP name from last session. Dicussed recent PCP appointment with reduced recall noted. Addressed mentioned concerns pertinent to ST, including memory loss (I.e., forgetting medications) and overall reduced awareness. Educated and instructed pertinent cognitive compensations and recommendations to aid completion of iADLs (recommending wife supervision and assistance with meds) and recall of functional information. Targeted writing down information given auditory stimuli (voicemails), with frequent repetition required to aid accuracy of recall. Recertification completed for remaining visits with LTGs downgraded d/t persistent cognitive challenges despite SLP intervention, which may be indicative of underlying memory/cognitive impairment.   01-28-23: Errors noted on homework despite explicit written instructions to optimize descriptions for word retrieval. Pt continues to use one word or vague response to describe item despite repetitive instruction and practice. Modeling and re-instruction was ineffective so provided alternate strategy for synonyms for word retrieval. Occasional mod A required to ID appropriate synonyms on structured task. Endorsed difficulty recalling name of new PCP. SLP generated written aid "memory cheat sheet" to write down pertinent information that pt has difficulty consistently recalling. Reported good carryover of to-do lists and pre-planning with wife as previously instructed. Suggested wife participate in therapy sessions to aid carryover and provide caregiver education.    01-26-23: Reviewed homework to use description strategy for word retrieval, with noted difficulty understanding and implementing compensation. SLP re-educated rationale and structured techniques to optimize describing ability (SFA,  written prompts, modeling) to aid word finding. Benefited from usual fading to occasional modeling and verbal cues to optimize specificity of descriptions. Usual extra processing time was beneficial for word retrieval and comprehension.   01-21-23: Endorsed difficulty alternating attention to transfer information between printed calendar and personal home calendar. Initial max A required to orient to today's date; recommended use of phone to aid orientation. Good carryover of phone exhibited throughout session when cued. Targeted transferring information from one written calendar to another with usual fading to occasional max A required to complete task. Overt errors noted x3 which pt did not ID even with cued double check. Given ongoing overt difficulty managing calendar due to impaired attention, recall, and awareness, recommended increased assistance from wife as pt unable to carryover trained techniques independently. Reported some use of trained description strategy to aid anomia with less frustration  reported. Reviewed descriptions written for homework with usual max cues required to increase specificity. Updated HEP to optimize descriptions for additional practice.  01-12-23: Returned after nearly 1 month d/t awaiting auth for additional ST visits. In verbal recap of last month, pt reported he "flunked" driving test which resulted in revoking of driver's license. Also reported missing several hearing aid appointments due to life situations. Endorsed good carryover of calendar and memory supports to aid recall, with success indicated. Cued recall of recent ED event, in which pt endorsed possible psychosis. Some difficulty with thought organization and verbal expression noted today in conversation. Conversational speech c/b intermittent vague language and occasional anomia. Pt stated "there are some words I can't get out." Targeted word retrieval strategies and SFA this session to optimize communication  effectiveness. Usual mod A required to aid comprehension and carryover.   12-19-22: Able to demo improved good problem solving, in which pt identified appropriate hearing clinic with appointment scheduled. Cued patient to write appointment on calendar. Min A required to ID correct date (19th vs 29th). Targeted problem solving to determine if this clinic accepts his insurance. Prompted using computer to investigate. Intermittent min cues required to maintain attention to targeted subject. Educated and instructed attention/processing/comprehension strategies to aid patient participation in conversation. Identified strategies x4 to implement at home with wife to repair communication breakdown. Due to time constraints, word finding strategies to be educated and trained next session.    12-17-22: Pt has demonstrated increased ability to manage medications more effectively with revision of updated personal medication list and double checking per SLP recommendations. Reported progress with memory but endorsed difficulty in conversation, particularly over the phone. Deduced difficulty stems from hearing impairment and auditory processing/comprehension. Targeted functional problem solving to address hearing impairment with use of visual aid. With usual prompting to deduce possible solutions, pt able to ID best solution (see audiologist). Pt able to sequence appropriate next steps with usual min A. SLP initiated education and instruction of attention/auditory comprehension strategies to aid attention, recall, and understanding in conversation. Pt would benefit from additional training to aid receptive and expressive language as pt noted with intermittent difficulty with word retrieval and answering SLP questions in conversation today.   PATIENT EDUCATION: Education details: see above Person educated: Patient Education method: Programmer, multimedia, Demonstration, Verbal cues, and Handouts Education comprehension: verbalized  understanding, returned demonstration, and needs further education   GOALS: Goals reviewed with patient? Yes  SHORT TERM GOALS: Target date: 12/05/2022  Complete CLQT and PROM first 1-2 sessions with thorough discussion of results to heighten pt awareness  Baseline:  Goal status: MET  2.  Pt will follow multi-step instructions to complete functional task with ~90% accuracy given occasional min A for 2/2 opportunities  Baseline: 11-28-22 Goal status: PARTIALLY MET  3.  Pt will demonstrate increased attention to detail on structured iADL tasks with 3 or less errors exhibited given occasional min A  Baseline: 12/05/22 Goal status: PARTIALLY MET  4.  Pt will demonstrate increased safety awareness by verbalizing problem and solution prior to initiating task for 2/2 opportunities  Baseline: 11-19-22 Goal status: PARTIALLY MET  5.   Pt will utilize processing/attention strategies in short structured conversations to complete entirety of thought for 80% of messages given occasional min A  Baseline: 11-19-22, 11-21-22 Goal status: MET  LONG TERM GOALS: Target date: 04/27/23 (new re-cert date)  Pt will successfully manage iADLs (medications, finances, appointments) with use of memory aids and compensations given supervision from wife Baseline:  Goal status: MET   2.  Pt will demonstrate improved safety awareness and attention to detail with less frequent A from wife to ID safety concerns and complete functional tasks given supervision from wife Baseline:  Goal status: MET   3.  Pt will utilize processing/attention strategies in extended unstructured conversations to complete entirety of thoughts for ~90% of messages given intermittent min A x2 sessions Baseline:  Goal status: IN PROGRESS (ongoing at recert)  4.  Pt will demonstrate [facility] verbal expression with ability to compensate for anomia/dysnomia with trained strategy given occasional min A in 30+ minute conversations x2 sessions Baseline:   Goal status: IN PROGRESS (ongoing at recert)   ASSESSMENT:  CLINICAL IMPRESSION: Patient is a 77 y.o. male who was seen today for cognitive linguistic rehabilitation following SDH from fall in July 2023. Presents with inconsistent improvements in cognition (memory, attention, executive functioning) and communication (expressive and receptive language) impacting his ability to independently manage his affairs and converse effectively. Conducting ongoing education and training with patient and wife to aid communication and cognitive functioning for increased carryover at home. Given functional impact and increased frustration for patient and wife, pt would benefit from skilled ST intervention to optimize cognitive functioning to maximize return to prior baseline.   OBJECTIVE IMPAIRMENTS: include attention, memory, awareness, and executive functioning. These impairments are limiting patient from managing medications, managing finances, and household responsibilities. Factors affecting potential to achieve goals and functional outcome are ability to learn/carryover information. Patient will benefit from skilled SLP services to address above impairments and improve overall function.  REHAB POTENTIAL: Good  PLAN:  SLP FREQUENCY: 2x/week  SLP DURATION: 12 weeks ( + 6 weeks for recert)  PLANNED INTERVENTIONS: Language facilitation, Environmental controls, Cueing hierachy, Cognitive reorganization, Internal/external aids, Functional tasks, Multimodal communication approach, SLP instruction and feedback, Compensatory strategies, and Patient/family education    [place], Student-SLP 04/08/2023, 8:01 AM

## 2023-04-13 ENCOUNTER — Telehealth: Payer: Self-pay

## 2023-04-13 ENCOUNTER — Ambulatory Visit: Payer: Worker's Compensation

## 2023-04-13 NOTE — Telephone Encounter (Signed)
Called preferred mobile number and left VM re: two recent no-shows for ST tx. Provided next upcoming ST appointment. Recommended calling front office staff if re-scheduling needed.

## 2023-04-13 NOTE — Therapy (Deleted)
OUTPATIENT SPEECH LANGUAGE PATHOLOGY TREATMENT  Patient Name: Evan [name] MRN: [id number] DOB:Aug 20, 1946, 77 y.o., male Today's Date: 04/13/2023  PCP: Marden Noble MD REFERRING PROVIDER: Tia Alert MD  END OF SESSION:            Past Medical History:  Diagnosis Date   Acquired dilation of ascending aorta and aortic root (HCC)    a.) measurements by TTE in 04/2021 --> root 40 mm and ascending aorta 43 mm   Anginal pain (HCC)    Anxiety    Atrial flutter (HCC) 04/22/2021   a.) CHADS2VASc = 5 (age x 2, HTN, T2DM, CAD); b.) daily warfarin   Carotid disease, bilateral (HCC)    a.) Doppler in 2019 --> 1-39% stenosis on RIGHT and 40-58% on LEFT   Cataracts, bilateral    Chronic anticoagulation    Warfarin   Chronic renal insufficiency    Coronary artery disease    a.) s/p 3v CABG in 1999   Depression    GERD (gastroesophageal reflux disease)    History of kidney stones    Hypercholesteremia    Hypertension    Kidney stone    Low testosterone    on TRT injections   Malignant melanoma (HCC)    a.) face, nose, left leg; b.) Tx'd with chemotherapy + XRT at [facility]   Myocardial infarction [facility])    Pulmonary emboli (HCC)    S/P CABG x 3 1999   a.) LIMA-LAD, SVG-RCA, SVG-diagnonal   Sleep apnea    not using nocturnal PAP therapy   T2DM (type 2 diabetes mellitus) (HCC)    Past Surgical History:  Procedure Laterality Date   CARDIAC CATHETERIZATION     x3     last 1999   CARDIOVERSION N/A 08/12/2021   Procedure: CARDIOVERSION;  Surgeon: Pricilla Riffle, MD;  Location: [facility] ENDOSCOPY;  Service: Cardiovascular;  Laterality: N/A;   CATARACT EXTRACTION Bilateral    COLONOSCOPY WITH PROPOFOL N/A 09/26/2014   Procedure: COLONOSCOPY WITH PROPOFOL;  Surgeon: Charolett Bumpers, MD;  Location: [facility] ENDOSCOPY;  Service: Endoscopy;  Laterality: N/A;   CORONARY ARTERY BYPASS GRAFT N/A 1999   3v; LIMA-LAD, SVG-RCA, SVG-diagonal   HOLEP-LASER ENUCLEATION OF THE PROSTATE WITH  MORCELLATION N/A 06/07/2021   Procedure: HOLEP-LASER ENUCLEATION OF THE PROSTATE WITH MORCELLATION;  Surgeon: Sondra Come, MD;  Location: [facility] ORS;  Service: Urology;  Laterality: N/A;   KNEE ARTHROSCOPY Left    LAPAROSCOPIC CHOLECYSTECTOMY     MASS EXCISION  04/15/2012   Procedure: EXCISION MASS;  Surgeon: Suzanna Obey, MD;  Location: [facility] OR;  Service: ENT;  Laterality: Left;  Left tonsil biopsy   melanomia     several skin ca removed-nose, left ankle, parotid gland left, right nodes-being followed annually..   Patient Active Problem List   Diagnosis Date Noted   Hypoglycemia 02/14/2023   SDH (subdural hematoma) (HCC) 04/11/2022   Fall (on)(from) sidewalk curb, initial encounter 04/11/2022   History of melanoma excision 04/11/2022   Diabetes mellitus type 2 in obese 04/11/2022   DNR (do not resuscitate) 04/11/2022   Chronic anticoagulation 06/30/2021   Typical atrial flutter (HCC) 06/28/2021   Pure hypercholesterolemia 12/23/2013   Carotid artery disease (HCC) 12/23/2013   Essential hypertension, benign 12/23/2013   Angina decubitus 12/23/2013   Coronary artery disease    Cancer (HCC)    Depression    Pulmonary emboli (HCC)    Hypercholesteremia    Kidney stone    Chronic renal insufficiency  ONSET DATE: 04/11/2022 (referral=11/06/22)  REFERRING DIAG: S06.5XAA- Traumatic subdural hemorrhage with loss of consciousness   THERAPY DIAG: No diagnosis found.  Rationale for Evaluation and Treatment: Rehabilitation  SUBJECTIVE:   SUBJECTIVE STATEMENT: *** Pt accompanied by: self  PERTINENT HISTORY: 77 y.o. male presents to [facility] hospital on 7/14 after falling. Pt found to have small parafalcine SDH and right tentorial SDH , reporting nausea, HA, and dizziness. PMH:  includes anxiety, aflutter, depression, HTN, PE, CABG, DMII.   PAIN: Are you having pain? No  FALLS: Has patient fallen in last 6 months?  Yes, Number of falls: 3-4 (tripping)  PATIENT GOALS: "I want to be able  to figure things out on my own. I want to confidently talk with people and not feel like I have to run and hide "   OBJECTIVE:   TODAY'S TREATMENT:                                                                                                                                         04-13-23: ***   04-01-23: Returned with trial hearing aids in place, with pt reporting improvements in hearing acuity and some processing. Targeted thought organization, memory, and communication with functional note taking task. Frequent mod prompting required to aid thought organization and recall prior to writing as pt was quick to write down verbatim responses. Exhibited difficulty differentiating between relevant and irrelevant information and writing cohesive reminder. Usual mod to max A required to write specific, detailed reminder and follow step-by-step instructions. Notable difficulty observed with recalling targeted term despite repetition and written cue. Unable to find written targeted term on paper despite max A. Consistent prompting required for usual vague responses, in which additional processing time was occasionally beneficial. Briefly discussed ongoing cognitive linguistic challenges with wife.   03-25-23: Patient states that a lot of things is going on in his life, and that it limits his thinking. Demonstrated difficulty word finding during unstructured conversation. Often able to ID targeted word with additional time or cue to aid attention to topic. Required occasional min-A of gestural and semantic cues. SLP provided education on creating plan to address issues as they come up. ST targeted communication in light attention and memory deficits through completing stories from story starters.  Patient required initial model, frequent guided questions, and visual aid of framework of story telling and feedback to improve responses. Patient's rating of responses matched SLP judgement with pt demonstrating awareness  of reduced attention and tangential comments. Of note, at conclusion of SLP session, pt reported safety concern of using ladder to change light bulb while alone at home despite intellectual awareness of balance deficits and heightened fall risk.   03-16-23: Patient and SLP discusses miscommunication with previous two missed appointments. Patient advocated for himself and asked the front desk to write down his appointments for him. Patient has added names to his cheat sheet to  find names of people who he has forgotten. SLP led through putting labels for each parson to for clarity, and to add notes for clarity. Patient states that [name] is managing his pills and that he feels very safe with the system that they have set up at home. SLP reviewed goals with patient and updated progress towards goals. Education provided on speech therapy in addressing word finding, memory, attention, and other cognitive tasks. Discussed medical history/medication to add to create a separate cheat sheet. Patient sent home to complete medical cheat sheet.   02-27-23: Patient missed earlier PT evaluation as both pt and wife stated that they did not know of the appointment. Patient became frustrated in discussion of recent medical information, as pt unable to recall MD name.  Both wife and SLP cued use of  written memory cheat sheet as pt unable to recall with max-A of semantic cues from wife. HEP successfully completed as written on to-do list. Signs for reminders were put up in bathroom and were proven to be helpful to recall donning hearing aids. Led through [employer] pay worksheet with min-A and suggestions to be specific about due dates/paid as well as labeling specific month. Discussed continual need for speech therapy, as pt and wife reporting continued concerns of pt stammering/stuttering d/t anomia and frustration with communication breakdowns. Provided education and instruction of recommendations to optimize communication,  including intentional physchological sigh to reduce frustration, preparation for certain stressful communication scenarios (appointments and telephone calls) with scripting and writing down questions beforehand, and self-advocacy re: brain injury in conversation to request additional time, repetition, and written aids. Both patient and wife verbalized understanding and appreciation for recommendations.    02-25-23: Educated patient and wife on recommendations to optimize attention, recall, and verbal expression for functional tasks at home. Wife reported recent improvements in communication. Continue to recommend use of external aids to manage to-do lists and written reminders, with organization management provided today. Provided modeling and cues for wife to assist patient at home. See patient instructions for more details.   02-18-23: Prompted discussion re: recent hospitalization with intermittent min-mod A from wife required for word retrieval and recall. Benefited from extra processing time. Wife reported pt with increased fatigue when completing household tasks (MD planning to refer for PT). Engaged patient in discussion re: safety awareness and problem solving to ID current cognitive/physical limitations and pre-planning to account for deficits. Suggested discussing his plan aloud with his wife prior to initiating task. Provided education re: using visual aids to optimize attention and recall (ex: don hearing aids) as pt needs usual reminders at home. Continued to recommend wife assistance with medication and bills d/t reduced error awareness, inattention, and recall.   5-17-24[name] attended session today. Pt accurately recalled MD name from last session, indicating success of Spaced Retrieval therapy technique. Again, re-iterated ability to use this intervention with impaired recall of other functional information. Pt able to appropriately locate memory "cheat sheet" but no new additions included.  [name] endorsed now assisting with medications d/t MD questioning medication management at last appointment. Re-educated previous SLP recommendation for wife assisting with medicines d/t pt exhibiting reduced error awareness and attention/memory deficits. Suggested scaffolding assistance during functional tasks at home, including supervision first and increasing wife assistance as needed. Current medication and financial systems to be re-assessed this weekend with wife supervision, given pt has been managing independently with visual aids. Plan to target some online bill pay and financial management scenarios next session.   02-11-23:  Attempted to retell recent story related to recalling medical information but unable to recall MD name. Did successfully request written information to aid processing and recall as recommended. Cued use of visual memory support, which was effective. Utilized Office manager Therapy to target recall of MD name, in which pt able to recall targeted response up to 16 minute interval indicating increased ability to retain targeted information. Unable to recall relevant family information (grandchildren names); therefore, SLP prompted use of visual support (family tree) to aid recall. SLP prompted sentence completion was helpful to recall one granddaughter. Continued to recommend writing down relevant information as needed.   02-06-23: Wife attended session today. Provided education of clinical observations and targeted ST interventions related to ongoing cognitive and communication challenges. Conducted caregiver education re: recommended techniques to optimize recall/attention in conversation (writing notes, internal rehearsal) as well as word finding (cueing hierarchy) to reduce vague speech. Wife verbalized understanding and appreciation for recommendations. Today, targeted use of note writing to aid attention and recall in conversation. Pt able to completed with some intermittent mod  A to focus on key words versus sentences.   02-04-23: Continues to come alone despite recommendation for wife to attend ST. No additional information added to "memory cheat sheet" other than PCP name from last session. Dicussed recent PCP appointment with reduced recall noted. Addressed mentioned concerns pertinent to ST, including memory loss (I.e., forgetting medications) and overall reduced awareness. Educated and instructed pertinent cognitive compensations and recommendations to aid completion of iADLs (recommending wife supervision and assistance with meds) and recall of functional information. Targeted writing down information given auditory stimuli (voicemails), with frequent repetition required to aid accuracy of recall. Recertification completed for remaining visits with LTGs downgraded d/t persistent cognitive challenges despite SLP intervention, which may be indicative of underlying memory/cognitive impairment.   01-28-23: Errors noted on homework despite explicit written instructions to optimize descriptions for word retrieval. Pt continues to use one word or vague response to describe item despite repetitive instruction and practice. Modeling and re-instruction was ineffective so provided alternate strategy for synonyms for word retrieval. Occasional mod A required to ID appropriate synonyms on structured task. Endorsed difficulty recalling name of new PCP. SLP generated written aid "memory cheat sheet" to write down pertinent information that pt has difficulty consistently recalling. Reported good carryover of to-do lists and pre-planning with wife as previously instructed. Suggested wife participate in therapy sessions to aid carryover and provide caregiver education.    01-26-23: Reviewed homework to use description strategy for word retrieval, with noted difficulty understanding and implementing compensation. SLP re-educated rationale and structured techniques to optimize describing ability (SFA,  written prompts, modeling) to aid word finding. Benefited from usual fading to occasional modeling and verbal cues to optimize specificity of descriptions. Usual extra processing time was beneficial for word retrieval and comprehension.   01-21-23: Endorsed difficulty alternating attention to transfer information between printed calendar and personal home calendar. Initial max A required to orient to today's date; recommended use of phone to aid orientation. Good carryover of phone exhibited throughout session when cued. Targeted transferring information from one written calendar to another with usual fading to occasional max A required to complete task. Overt errors noted x3 which pt did not ID even with cued double check. Given ongoing overt difficulty managing calendar due to impaired attention, recall, and awareness, recommended increased assistance from wife as pt unable to carryover trained techniques independently. Reported some use of trained description strategy to aid anomia with less frustration  reported. Reviewed descriptions written for homework with usual max cues required to increase specificity. Updated HEP to optimize descriptions for additional practice.  01-12-23: Returned after nearly 1 month d/t awaiting auth for additional ST visits. In verbal recap of last month, pt reported he "flunked" driving test which resulted in revoking of driver's license. Also reported missing several hearing aid appointments due to life situations. Endorsed good carryover of calendar and memory supports to aid recall, with success indicated. Cued recall of recent ED event, in which pt endorsed possible psychosis. Some difficulty with thought organization and verbal expression noted today in conversation. Conversational speech c/b intermittent vague language and occasional anomia. Pt stated "there are some words I can't get out." Targeted word retrieval strategies and SFA this session to optimize communication  effectiveness. Usual mod A required to aid comprehension and carryover.   12-19-22: Able to demo improved good problem solving, in which pt identified appropriate hearing clinic with appointment scheduled. Cued patient to write appointment on calendar. Min A required to ID correct date (19th vs 29th). Targeted problem solving to determine if this clinic accepts his insurance. Prompted using computer to investigate. Intermittent min cues required to maintain attention to targeted subject. Educated and instructed attention/processing/comprehension strategies to aid patient participation in conversation. Identified strategies x4 to implement at home with wife to repair communication breakdown. Due to time constraints, word finding strategies to be educated and trained next session.    12-17-22: Pt has demonstrated increased ability to manage medications more effectively with revision of updated personal medication list and double checking per SLP recommendations. Reported progress with memory but endorsed difficulty in conversation, particularly over the phone. Deduced difficulty stems from hearing impairment and auditory processing/comprehension. Targeted functional problem solving to address hearing impairment with use of visual aid. With usual prompting to deduce possible solutions, pt able to ID best solution (see audiologist). Pt able to sequence appropriate next steps with usual min A. SLP initiated education and instruction of attention/auditory comprehension strategies to aid attention, recall, and understanding in conversation. Pt would benefit from additional training to aid receptive and expressive language as pt noted with intermittent difficulty with word retrieval and answering SLP questions in conversation today.   PATIENT EDUCATION: Education details: see above Person educated: Patient Education method: Programmer, multimedia, Demonstration, Verbal cues, and Handouts Education comprehension: verbalized  understanding, returned demonstration, and needs further education   GOALS: Goals reviewed with patient? Yes  SHORT TERM GOALS: Target date: 12/05/2022  Complete CLQT and PROM first 1-2 sessions with thorough discussion of results to heighten pt awareness  Baseline:  Goal status: MET  2.  Pt will follow multi-step instructions to complete functional task with ~90% accuracy given occasional min A for 2/2 opportunities  Baseline: 11-28-22 Goal status: PARTIALLY MET  3.  Pt will demonstrate increased attention to detail on structured iADL tasks with 3 or less errors exhibited given occasional min A  Baseline: 12/05/22 Goal status: PARTIALLY MET  4.  Pt will demonstrate increased safety awareness by verbalizing problem and solution prior to initiating task for 2/2 opportunities  Baseline: 11-19-22 Goal status: PARTIALLY MET  5.   Pt will utilize processing/attention strategies in short structured conversations to complete entirety of thought for 80% of messages given occasional min A  Baseline: 11-19-22, 11-21-22 Goal status: MET  LONG TERM GOALS: Target date: 04/27/23 (new re-cert date)  Pt will successfully manage iADLs (medications, finances, appointments) with use of memory aids and compensations given supervision from wife Baseline:  Goal status: MET   2.  Pt will demonstrate improved safety awareness and attention to detail with less frequent A from wife to ID safety concerns and complete functional tasks given supervision from wife Baseline:  Goal status: MET   3.  Pt will utilize processing/attention strategies in extended unstructured conversations to complete entirety of thoughts for ~90% of messages given intermittent min A x2 sessions Baseline:  Goal status: IN PROGRESS (ongoing at recert)  4.  Pt will demonstrate [facility] verbal expression with ability to compensate for anomia/dysnomia with trained strategy given occasional min A in 30+ minute conversations x2 sessions Baseline:   Goal status: IN PROGRESS (ongoing at recert)   ASSESSMENT:  CLINICAL IMPRESSION: Patient is a 77 y.o. male who was seen today for cognitive linguistic rehabilitation following SDH from fall in July 2023. Presents with inconsistent improvements in cognition (memory, attention, executive functioning) and communication (expressive and receptive language) impacting his ability to independently manage his affairs and converse effectively. Conducting ongoing education and training with patient and wife to aid communication and cognitive functioning for increased carryover at home. Given functional impact and increased frustration for patient and wife, pt would benefit from skilled ST intervention to optimize cognitive functioning to maximize return to prior baseline.   OBJECTIVE IMPAIRMENTS: include attention, memory, awareness, and executive functioning. These impairments are limiting patient from managing medications, managing finances, and household responsibilities. Factors affecting potential to achieve goals and functional outcome are ability to learn/carryover information. Patient will benefit from skilled SLP services to address above impairments and improve overall function.  REHAB POTENTIAL: Good  PLAN:  SLP FREQUENCY: 2x/week  SLP DURATION: 12 weeks ( + 6 weeks for recert)  PLANNED INTERVENTIONS: Language facilitation, Environmental controls, Cueing hierachy, Cognitive reorganization, Internal/external aids, Functional tasks, Multimodal communication approach, SLP instruction and feedback, Compensatory strategies, and Patient/family education    [place], Student-SLP 04/13/2023, 8:04 AM

## 2023-04-17 DIAGNOSIS — Z7901 Long term (current) use of anticoagulants: Secondary | ICD-10-CM | POA: Diagnosis not present

## 2023-04-22 ENCOUNTER — Ambulatory Visit: Payer: Worker's Compensation

## 2023-04-22 DIAGNOSIS — R41841 Cognitive communication deficit: Secondary | ICD-10-CM

## 2023-04-22 DIAGNOSIS — E291 Testicular hypofunction: Secondary | ICD-10-CM | POA: Diagnosis not present

## 2023-04-22 NOTE — Patient Instructions (Signed)
Write date and specific information when you are taking notes   Ask for a break while you write   Read back your notes aloud to help your attention

## 2023-04-22 NOTE — Therapy (Signed)
OUTPATIENT SPEECH LANGUAGE PATHOLOGY TREATMENT  Patient Name: Evan [name] MRN: [id number] DOB:[birth year], 77 y.o., male Today's Date: 04/22/2023  PCP: Marden Noble MD REFERRING PROVIDER: Tia Alert MD  END OF SESSION:  End of Session - 04/22/23 0938     Visit Number 28    Number of Visits 33    Date for SLP Re-Evaluation 04/27/23    Authorization Type [employer]    Authorization Time Period 8 additional ST visits approved 03/24/23    Authorization - Visit Number 3    Authorization - Number of Visits 8    SLP Start Time 0935    SLP Stop Time  1015    SLP Time Calculation (min) 40 min    Activity Tolerance Patient tolerated treatment well                      Past Medical History:  Diagnosis Date   Acquired dilation of ascending aorta and aortic root (HCC)    a.) measurements by TTE in 04/2021 --> root 40 mm and ascending aorta 43 mm   Anginal pain (HCC)    Anxiety    Atrial flutter (HCC) 04/22/2021   a.) CHADS2VASc = 5 (age x 2, HTN, T2DM, CAD); b.) daily warfarin   Carotid disease, bilateral (HCC)    a.) Doppler in 2019 --> 1-39% stenosis on RIGHT and 40-58% on LEFT   Cataracts, bilateral    Chronic anticoagulation    Warfarin   Chronic renal insufficiency    Coronary artery disease    a.) s/p 3v CABG in 1999   Depression    GERD (gastroesophageal reflux disease)    History of kidney stones    Hypercholesteremia    Hypertension    Kidney stone    Low testosterone    on TRT injections   Malignant melanoma (HCC)    a.) face, nose, left leg; b.) Tx'd with chemotherapy + XRT at [facility]   Myocardial infarction [facility])    Pulmonary emboli (HCC)    S/P CABG x 3 1999   a.) LIMA-LAD, SVG-RCA, SVG-diagnonal   Sleep apnea    not using nocturnal PAP therapy   T2DM (type 2 diabetes mellitus) (HCC)    Past Surgical History:  Procedure Laterality Date   CARDIAC CATHETERIZATION     x3     last 1999   CARDIOVERSION N/A  08/12/2021   Procedure: CARDIOVERSION;  Surgeon: Pricilla Riffle, MD;  Location: [facility] ENDOSCOPY;  Service: Cardiovascular;  Laterality: N/A;   CATARACT EXTRACTION Bilateral    COLONOSCOPY WITH PROPOFOL N/A 09/26/2014   Procedure: COLONOSCOPY WITH PROPOFOL;  Surgeon: Charolett Bumpers, MD;  Location: [facility] ENDOSCOPY;  Service: Endoscopy;  Laterality: N/A;   CORONARY ARTERY BYPASS GRAFT N/A 1999   3v; LIMA-LAD, SVG-RCA, SVG-diagonal   HOLEP-LASER ENUCLEATION OF THE PROSTATE WITH MORCELLATION N/A 06/07/2021   Procedure: HOLEP-LASER ENUCLEATION OF THE PROSTATE WITH MORCELLATION;  Surgeon: Sondra Come, MD;  Location: [facility] ORS;  Service: Urology;  Laterality: N/A;   KNEE ARTHROSCOPY Left    LAPAROSCOPIC CHOLECYSTECTOMY     MASS EXCISION  04/15/2012   Procedure: EXCISION MASS;  Surgeon: Suzanna Obey, MD;  Location: [facility] OR;  Service: ENT;  Laterality: Left;  Left tonsil biopsy   melanomia     several skin ca removed-nose, left ankle, parotid gland left, right nodes-being followed annually..   Patient Active Problem List   Diagnosis Date Noted   Hypoglycemia 02/14/2023  SDH (subdural hematoma) (HCC) 04/11/2022   Fall (on)(from) sidewalk curb, initial encounter 04/11/2022   History of melanoma excision 04/11/2022   Diabetes mellitus type 2 in obese 04/11/2022   DNR (do not resuscitate) 04/11/2022   Chronic anticoagulation 06/30/2021   Typical atrial flutter (HCC) 06/28/2021   Pure hypercholesterolemia 12/23/2013   Carotid artery disease (HCC) 12/23/2013   Essential hypertension, benign 12/23/2013   Angina decubitus 12/23/2013   Coronary artery disease    Cancer (HCC)    Depression    Pulmonary emboli (HCC)    Hypercholesteremia    Kidney stone    Chronic renal insufficiency     ONSET DATE: 04/11/2022 (referral=11/06/22)  REFERRING DIAG: S06.5XAA- Traumatic subdural hemorrhage with loss of consciousness   THERAPY DIAG: Cognitive communication deficit  Rationale for Evaluation and Treatment:  Rehabilitation  SUBJECTIVE:   SUBJECTIVE STATEMENT: "I'm upset and confused. I can't remember. I tried to write it down" re: recent appointments  Pt accompanied by: self  PERTINENT HISTORY: 77 y.o. male presents to [facility] hospital on 7/14 after falling. Pt found to have small parafalcine SDH and right tentorial SDH , reporting nausea, HA, and dizziness. PMH:  includes anxiety, aflutter, depression, HTN, PE, CABG, DMII.   PAIN: Are you having pain? No  FALLS: Has patient fallen in last 6 months?  Yes, Number of falls: 3-4 (tripping)  PATIENT GOALS: "I want to be able to figure things out on my own. I want to confidently talk with people and not feel like I have to run and hide "   OBJECTIVE:   TODAY'S TREATMENT:                                                                                                                                         04-22-23: Vague speech exhibited re: recent appointments and explanation of missed ST visits. Expressed frustration with reduced recall of specific information in this conversation. Tried to use written aid to prompt recall. Demonstrated reduced awareness of written cue that was present. Pt reported wife expressed improved thinking since last visit but unable to provide specifics. Given reported recall deficits, provided written visual aid to aid recall of necessary items to leave home (I.e., hearing aids). Provided recommendations for wife to aid attention (ex: providing specific/direct instruction; 1 task at a time). Targeted problem solving to optimize appointment management. Pt able to ID solution x1 but required max prompting to ID other solutions x2.   04-01-23: Returned with trial hearing aids in place, with pt reporting improvements in hearing acuity and some processing. Targeted thought organization, memory, and communication with functional note taking task. Frequent mod prompting required to aid thought organization and recall prior to writing as pt  was quick to write down verbatim responses. Exhibited difficulty differentiating between relevant and irrelevant information and writing cohesive reminder. Usual mod to max A required to write specific, detailed reminder and  follow step-by-step instructions. Notable difficulty observed with recalling targeted term despite repetition and written cue. Unable to find written targeted term on paper despite max A. Consistent prompting required for usual vague responses, in which additional processing time was occasionally beneficial. Briefly discussed ongoing cognitive linguistic challenges with wife.   03-25-23: Patient states that a lot of things is going on in his life, and that it limits his thinking. Demonstrated difficulty word finding during unstructured conversation. Often able to ID targeted word with additional time or cue to aid attention to topic. Required occasional min-A of gestural and semantic cues. SLP provided education on creating plan to address issues as they come up. ST targeted communication in light attention and memory deficits through completing stories from story starters.  Patient required initial model, frequent guided questions, and visual aid of framework of story telling and feedback to improve responses. Patient's rating of responses matched SLP judgement with pt demonstrating awareness of reduced attention and tangential comments. Of note, at conclusion of SLP session, pt reported safety concern of using ladder to change light bulb while alone at home despite intellectual awareness of balance deficits and heightened fall risk.   03-16-23: Patient and SLP discusses miscommunication with previous two missed appointments. Patient advocated for himself and asked the front desk to write down his appointments for him. Patient has added names to his cheat sheet to find names of people who he has forgotten. SLP led through putting labels for each parson to for clarity, and to add notes for  clarity. Patient states that Evan [name] is managing his pills and that he feels very safe with the system that they have set up at home. SLP reviewed goals with patient and updated progress towards goals. Education provided on speech therapy in addressing word finding, memory, attention, and other cognitive tasks. Discussed medical history/medication to add to create a separate cheat sheet. Patient sent home to complete medical cheat sheet.   02-27-23: Patient missed earlier PT evaluation as both pt and wife stated that they did not know of the appointment. Patient became frustrated in discussion of recent medical information, as pt unable to recall MD name.  Both wife and SLP cued use of  written memory cheat sheet as pt unable to recall with max-A of semantic cues from wife. HEP successfully completed as written on to-do list. Signs for reminders were put up in bathroom and were proven to be helpful to recall donning hearing aids. Led through [employer] pay worksheet with min-A and suggestions to be specific about due dates/paid as well as labeling specific month. Discussed continual need for speech therapy, as pt and wife reporting continued concerns of pt stammering/stuttering d/t anomia and frustration with communication breakdowns. Provided education and instruction of recommendations to optimize communication, including intentional physchological sigh to reduce frustration, preparation for certain stressful communication scenarios (appointments and telephone calls) with scripting and writing down questions beforehand, and self-advocacy re: brain injury in conversation to request additional time, repetition, and written aids. Both patient and wife verbalized understanding and appreciation for recommendations.    02-25-23: Educated patient and wife on recommendations to optimize attention, recall, and verbal expression for functional tasks at home. Wife reported recent improvements in communication. Continue to  recommend use of external aids to manage to-do lists and written reminders, with organization management provided today. Provided modeling and cues for wife to assist patient at home. See patient instructions for more details.   02-18-23: Prompted discussion re: recent hospitalization with intermittent  min-mod A from wife required for word retrieval and recall. Benefited from extra processing time. Wife reported pt with increased fatigue when completing household tasks (MD planning to refer for PT). Engaged patient in discussion re: safety awareness and problem solving to ID current cognitive/physical limitations and pre-planning to account for deficits. Suggested discussing his plan aloud with his wife prior to initiating task. Provided education re: using visual aids to optimize attention and recall (ex: don hearing aids) as pt needs usual reminders at home. Continued to recommend wife assistance with medication and bills d/t reduced error awareness, inattention, and recall.   5-17-24Bonita [name] attended session today. Pt accurately recalled MD name from last session, indicating success of Spaced Retrieval therapy technique. Again, re-iterated ability to use this intervention with impaired recall of other functional information. Pt able to appropriately locate memory "cheat sheet" but no new additions included. Evan [name] endorsed now assisting with medications d/t MD questioning medication management at last appointment. Re-educated previous SLP recommendation for wife assisting with medicines d/t pt exhibiting reduced error awareness and attention/memory deficits. Suggested scaffolding assistance during functional tasks at home, including supervision first and increasing wife assistance as needed. Current medication and financial systems to be re-assessed this weekend with wife supervision, given pt has been managing independently with visual aids. Plan to target some online bill pay and financial management scenarios  next session.   02-11-23: Attempted to retell recent story related to recalling medical information but unable to recall MD name. Did successfully request written information to aid processing and recall as recommended. Cued use of visual memory support, which was effective. Utilized Office manager Therapy to target recall of MD name, in which pt able to recall targeted response up to 16 minute interval indicating increased ability to retain targeted information. Unable to recall relevant family information (grandchildren names); therefore, SLP prompted use of visual support (family tree) to aid recall. SLP prompted sentence completion was helpful to recall one granddaughter. Continued to recommend writing down relevant information as needed.   02-06-23: Wife attended session today. Provided education of clinical observations and targeted ST interventions related to ongoing cognitive and communication challenges. Conducted caregiver education re: recommended techniques to optimize recall/attention in conversation (writing notes, internal rehearsal) as well as word finding (cueing hierarchy) to reduce vague speech. Wife verbalized understanding and appreciation for recommendations. Today, targeted use of note writing to aid attention and recall in conversation. Pt able to completed with some intermittent mod A to focus on key words versus sentences.   02-04-23: Continues to come alone despite recommendation for wife to attend ST. No additional information added to "memory cheat sheet" other than PCP name from last session. Dicussed recent PCP appointment with reduced recall noted. Addressed mentioned concerns pertinent to ST, including memory loss (I.e., forgetting medications) and overall reduced awareness. Educated and instructed pertinent cognitive compensations and recommendations to aid completion of iADLs (recommending wife supervision and assistance with meds) and recall of functional information. Targeted  writing down information given auditory stimuli (voicemails), with frequent repetition required to aid accuracy of recall. Recertification completed for remaining visits with LTGs downgraded d/t persistent cognitive challenges despite SLP intervention, which may be indicative of underlying memory/cognitive impairment.   01-28-23: Errors noted on homework despite explicit written instructions to optimize descriptions for word retrieval. Pt continues to use one word or vague response to describe item despite repetitive instruction and practice. Modeling and re-instruction was ineffective so provided alternate strategy for synonyms for word retrieval. Occasional mod A  required to ID appropriate synonyms on structured task. Endorsed difficulty recalling name of new PCP. SLP generated written aid "memory cheat sheet" to write down pertinent information that pt has difficulty consistently recalling. Reported good carryover of to-do lists and pre-planning with wife as previously instructed. Suggested wife participate in therapy sessions to aid carryover and provide caregiver education.    01-26-23: Reviewed homework to use description strategy for word retrieval, with noted difficulty understanding and implementing compensation. SLP re-educated rationale and structured techniques to optimize describing ability (SFA, written prompts, modeling) to aid word finding. Benefited from usual fading to occasional modeling and verbal cues to optimize specificity of descriptions. Usual extra processing time was beneficial for word retrieval and comprehension.   01-21-23: Endorsed difficulty alternating attention to transfer information between printed calendar and personal home calendar. Initial max A required to orient to today's date; recommended use of phone to aid orientation. Good carryover of phone exhibited throughout session when cued. Targeted transferring information from one written calendar to another with usual fading  to occasional max A required to complete task. Overt errors noted x3 which pt did not ID even with cued double check. Given ongoing overt difficulty managing calendar due to impaired attention, recall, and awareness, recommended increased assistance from wife as pt unable to carryover trained techniques independently. Reported some use of trained description strategy to aid anomia with less frustration reported. Reviewed descriptions written for homework with usual max cues required to increase specificity. Updated HEP to optimize descriptions for additional practice.  01-12-23: Returned after nearly 1 month d/t awaiting auth for additional ST visits. In verbal recap of last month, pt reported he "flunked" driving test which resulted in revoking of driver's license. Also reported missing several hearing aid appointments due to life situations. Endorsed good carryover of calendar and memory supports to aid recall, with success indicated. Cued recall of recent ED event, in which pt endorsed possible psychosis. Some difficulty with thought organization and verbal expression noted today in conversation. Conversational speech c/b intermittent vague language and occasional anomia. Pt stated "there are some words I can't get out." Targeted word retrieval strategies and SFA this session to optimize communication effectiveness. Usual mod A required to aid comprehension and carryover.   12-19-22: Able to demo improved good problem solving, in which pt identified appropriate hearing clinic with appointment scheduled. Cued patient to write appointment on calendar. Min A required to ID correct date (19th vs 29th). Targeted problem solving to determine if this clinic accepts his insurance. Prompted using computer to investigate. Intermittent min cues required to maintain attention to targeted subject. Educated and instructed attention/processing/comprehension strategies to aid patient participation in conversation. Identified  strategies x4 to implement at home with wife to repair communication breakdown. Due to time constraints, word finding strategies to be educated and trained next session.    12-17-22: Pt has demonstrated increased ability to manage medications more effectively with revision of updated personal medication list and double checking per SLP recommendations. Reported progress with memory but endorsed difficulty in conversation, particularly over the phone. Deduced difficulty stems from hearing impairment and auditory processing/comprehension. Targeted functional problem solving to address hearing impairment with use of visual aid. With usual prompting to deduce possible solutions, pt able to ID best solution (see audiologist). Pt able to sequence appropriate next steps with usual min A. SLP initiated education and instruction of attention/auditory comprehension strategies to aid attention, recall, and understanding in conversation. Pt would benefit from additional training to aid receptive and  expressive language as pt noted with intermittent difficulty with word retrieval and answering SLP questions in conversation today.   PATIENT EDUCATION: Education details: see above Person educated: Patient Education method: Programmer, multimedia, Demonstration, Verbal cues, and Handouts Education comprehension: verbalized understanding, returned demonstration, and needs further education   GOALS: Goals reviewed with patient? Yes  SHORT TERM GOALS: Target date: 12/05/2022  Complete CLQT and PROM first 1-2 sessions with thorough discussion of results to heighten pt awareness  Baseline:  Goal status: MET  2.  Pt will follow multi-step instructions to complete functional task with ~90% accuracy given occasional min A for 2/2 opportunities  Baseline: 11-28-22 Goal status: PARTIALLY MET  3.  Pt will demonstrate increased attention to detail on structured iADL tasks with 3 or less errors exhibited given occasional min A   Baseline: 12/05/22 Goal status: PARTIALLY MET  4.  Pt will demonstrate increased safety awareness by verbalizing problem and solution prior to initiating task for 2/2 opportunities  Baseline: 11-19-22 Goal status: PARTIALLY MET  5.   Pt will utilize processing/attention strategies in short structured conversations to complete entirety of thought for 80% of messages given occasional min A  Baseline: 11-19-22, 11-21-22 Goal status: MET  LONG TERM GOALS: Target date: 04/27/23 (new re-cert date)  Pt will successfully manage iADLs (medications, finances, appointments) with use of memory aids and compensations given supervision from wife Baseline:  Goal status: MET   2.  Pt will demonstrate improved safety awareness and attention to detail with less frequent A from wife to ID safety concerns and complete functional tasks given supervision from wife Baseline:  Goal status: MET   3.  Pt will utilize processing/attention strategies in extended unstructured conversations to complete entirety of thoughts for ~90% of messages given intermittent min A x2 sessions Baseline:  Goal status: IN PROGRESS (ongoing at recert)  4.  Pt will demonstrate [facility] verbal expression with ability to compensate for anomia/dysnomia with trained strategy given occasional min A in 30+ minute conversations x2 sessions Baseline:  Goal status: IN PROGRESS (ongoing at recert)   ASSESSMENT:  CLINICAL IMPRESSION: Patient is a 77 y.o. male who was seen today for cognitive linguistic rehabilitation following SDH from fall in July 2023. Presents with inconsistent improvements in cognition (memory, attention, executive functioning) and communication (expressive and receptive language) impacting his ability to independently manage his affairs and converse effectively. Conducting ongoing education and training with patient and wife to aid communication and cognitive functioning for increased carryover at home. Given functional impact  and increased frustration for patient and wife, pt would benefit from skilled ST intervention to optimize cognitive functioning to maximize return to prior baseline.   OBJECTIVE IMPAIRMENTS: include attention, memory, awareness, and executive functioning. These impairments are limiting patient from managing medications, managing finances, and household responsibilities. Factors affecting potential to achieve goals and functional outcome are ability to learn/carryover information. Patient will benefit from skilled SLP services to address above impairments and improve overall function.  REHAB POTENTIAL: Good  PLAN:  SLP FREQUENCY: 2x/week  SLP DURATION: 12 weeks ( + 6 weeks for recert)  PLANNED INTERVENTIONS: Language facilitation, Environmental controls, Cueing hierachy, Cognitive reorganization, Internal/external aids, Functional tasks, Multimodal communication approach, SLP instruction and feedback, Compensatory strategies, and Patient/family education    Gracy Racer, CCC-SLP 04/22/2023, 11:06 AM

## 2023-04-28 NOTE — Therapy (Unsigned)
OUTPATIENT SPEECH LANGUAGE PATHOLOGY TREATMENT (RECERT)  Patient Name: Evan [name] MRN: [id number] DOB:05-12-1946, 77 y.o., male Today's Date: 04/29/2023  PCP: Marden Noble MD REFERRING PROVIDER: Tia Alert MD  END OF SESSION:  End of Session - 04/29/23 0934     Visit Number 29    Number of Visits 33    Date for SLP Re-Evaluation 05/27/23   recert   Authorization Type [employer]    Authorization Time Period 8 additional ST visits approved 03/24/23    Authorization - Visit Number 4    Authorization - Number of Visits 8    SLP Start Time 0934    SLP Stop Time  1014    SLP Time Calculation (min) 40 min    Activity Tolerance Patient tolerated treatment well             Past Medical History:  Diagnosis Date   Acquired dilation of ascending aorta and aortic root (HCC)    a.) measurements by TTE in 04/2021 --> root 40 mm and ascending aorta 43 mm   Anginal pain (HCC)    Anxiety    Atrial flutter (HCC) 04/22/2021   a.) CHADS2VASc = 5 (age x 2, HTN, T2DM, CAD); b.) daily warfarin   Carotid disease, bilateral (HCC)    a.) Doppler in 2019 --> 1-39% stenosis on RIGHT and 40-58% on LEFT   Cataracts, bilateral    Chronic anticoagulation    Warfarin   Chronic renal insufficiency    Coronary artery disease    a.) s/p 3v CABG in 1999   Depression    GERD (gastroesophageal reflux disease)    History of kidney stones    Hypercholesteremia    Hypertension    Kidney stone    Low testosterone    on TRT injections   Malignant melanoma (HCC)    a.) face, nose, left leg; b.) Tx'd with chemotherapy + XRT at [facility]   Myocardial infarction [facility])    Pulmonary emboli (HCC)    S/P CABG x 3 1999   a.) LIMA-LAD, SVG-RCA, SVG-diagnonal   Sleep apnea    not using nocturnal PAP therapy   T2DM (type 2 diabetes mellitus) (HCC)    Past Surgical History:  Procedure Laterality Date   CARDIAC CATHETERIZATION     x3     last 1999   CARDIOVERSION N/A 08/12/2021    Procedure: CARDIOVERSION;  Surgeon: Pricilla Riffle, MD;  Location: [facility] ENDOSCOPY;  Service: Cardiovascular;  Laterality: N/A;   CATARACT EXTRACTION Bilateral    COLONOSCOPY WITH PROPOFOL N/A 09/26/2014   Procedure: COLONOSCOPY WITH PROPOFOL;  Surgeon: Charolett Bumpers, MD;  Location: [facility] ENDOSCOPY;  Service: Endoscopy;  Laterality: N/A;   CORONARY ARTERY BYPASS GRAFT N/A 1999   3v; LIMA-LAD, SVG-RCA, SVG-diagonal   HOLEP-LASER ENUCLEATION OF THE PROSTATE WITH MORCELLATION N/A 06/07/2021   Procedure: HOLEP-LASER ENUCLEATION OF THE PROSTATE WITH MORCELLATION;  Surgeon: Sondra Come, MD;  Location: [facility] ORS;  Service: Urology;  Laterality: N/A;   KNEE ARTHROSCOPY Left    LAPAROSCOPIC CHOLECYSTECTOMY     MASS EXCISION  04/15/2012   Procedure: EXCISION MASS;  Surgeon: Suzanna Obey, MD;  Location: [facility] OR;  Service: ENT;  Laterality: Left;  Left tonsil biopsy   melanomia     several skin ca removed-nose, left ankle, parotid gland left, right nodes-being followed annually..   Patient Active Problem List   Diagnosis Date Noted   Hypoglycemia 02/14/2023   SDH (subdural hematoma) (HCC) 04/11/2022  Fall (on)(from) sidewalk curb, initial encounter 04/11/2022   History of melanoma excision 04/11/2022   Diabetes mellitus type 2 in obese 04/11/2022   DNR (do not resuscitate) 04/11/2022   Chronic anticoagulation 06/30/2021   Typical atrial flutter (HCC) 06/28/2021   Pure hypercholesterolemia 12/23/2013   Carotid artery disease (HCC) 12/23/2013   Essential hypertension, benign 12/23/2013   Angina decubitus 12/23/2013   Coronary artery disease    Cancer (HCC)    Depression    Pulmonary emboli (HCC)    Hypercholesteremia    Kidney stone    Chronic renal insufficiency     ONSET DATE: 04/11/2022 (referral=11/06/22)  REFERRING DIAG: S06.5XAA- Traumatic subdural hemorrhage with loss of consciousness   THERAPY DIAG: Cognitive communication deficit  Rationale for Evaluation and Treatment:  Rehabilitation  SUBJECTIVE:   SUBJECTIVE STATEMENT: "I feel better in the morning." Pt accompanied by: self  PERTINENT HISTORY: 77 y.o. male presents to [facility] hospital on 7/14 after falling. Pt found to have small parafalcine SDH and right tentorial SDH , reporting nausea, HA, and dizziness. PMH:  includes anxiety, aflutter, depression, HTN, PE, CABG, DMII.   PAIN: Are you having pain? No  FALLS: Has patient fallen in last 6 months?  Yes, Number of falls: 3-4 (tripping)  PATIENT GOALS: "I want to be able to figure things out on my own. I want to confidently talk with people and not feel like I have to run and hide "   OBJECTIVE:   TODAY'S TREATMENT:                                                                                                                                         04-29-23: Patient states that utilizing the calendar at home with benefit of appointment management. Education provided on prepping for tasks/appointments ahead of time and budging time in order to get places on time. Targeted memory through external memory aids (ie. Notes) required frequent mod-A of immediate feedback to write specific notes to aid recall. Pt able to self-correct anomia x3 during today's session with extra time and use of description strategy. Targeted executive functioning by writing reminders/to do list for future doctor's visits. Patient required frequent redirection during today's session to remain on topic. Required semantic/gesture cues to go back in conversation and find what he was talking about.   04-22-23: Vague speech exhibited re: recent appointments and explanation of missed ST visits. Expressed frustration with reduced recall of specific information in this conversation. Tried to use written aid to prompt recall. Demonstrated reduced awareness of written cue that was present. Pt reported wife expressed improved thinking since last visit but unable to provide specifics. Given reported recall  deficits, provided written visual aid to aid recall of necessary items to leave home (I.e., hearing aids). Provided recommendations for wife to aid attention (ex: providing specific/direct instruction; 1 task at a time). Targeted problem solving to optimize appointment  management. Pt able to ID solution x1 but required max prompting to ID other solutions x2.   04-01-23: Returned with trial hearing aids in place, with pt reporting improvements in hearing acuity and some processing. Targeted thought organization, memory, and communication with functional note taking task. Frequent mod prompting required to aid thought organization and recall prior to writing as pt was quick to write down verbatim responses. Exhibited difficulty differentiating between relevant and irrelevant information and writing cohesive reminder. Usual mod to max A required to write specific, detailed reminder and follow step-by-step instructions. Notable difficulty observed with recalling targeted term despite repetition and written cue. Unable to find written targeted term on paper despite max A. Consistent prompting required for usual vague responses, in which additional processing time was occasionally beneficial. Briefly discussed ongoing cognitive linguistic challenges with wife.   03-25-23: Patient states that a lot of things is going on in his life, and that it limits his thinking. Demonstrated difficulty word finding during unstructured conversation. Often able to ID targeted word with additional time or cue to aid attention to topic. Required occasional min-A of gestural and semantic cues. SLP provided education on creating plan to address issues as they come up. ST targeted communication in light attention and memory deficits through completing stories from story starters.  Patient required initial model, frequent guided questions, and visual aid of framework of story telling and feedback to improve responses. Patient's rating of  responses matched SLP judgement with pt demonstrating awareness of reduced attention and tangential comments. Of note, at conclusion of SLP session, pt reported safety concern of using ladder to change light bulb while alone at home despite intellectual awareness of balance deficits and heightened fall risk.   03-16-23: Patient and SLP discusses miscommunication with previous two missed appointments. Patient advocated for himself and asked the front desk to write down his appointments for him. Patient has added names to his cheat sheet to find names of people who he has forgotten. SLP led through putting labels for each parson to for clarity, and to add notes for clarity. Patient states that [name] is managing his pills and that he feels very safe with the system that they have set up at home. SLP reviewed goals with patient and updated progress towards goals. Education provided on speech therapy in addressing word finding, memory, attention, and other cognitive tasks. Discussed medical history/medication to add to create a separate cheat sheet. Patient sent home to complete medical cheat sheet.   PATIENT EDUCATION: Education details: see above Person educated: Patient Education method: Programmer, multimedia, Demonstration, Verbal cues, and Handouts Education comprehension: verbalized understanding, returned demonstration, and needs further education   GOALS: Goals reviewed with patient? Yes  SHORT TERM GOALS: Target date: 12/05/2022  Complete CLQT and PROM first 1-2 sessions with thorough discussion of results to heighten pt awareness  Baseline:  Goal status: MET  2.  Pt will follow multi-step instructions to complete functional task with ~90% accuracy given occasional min A for 2/2 opportunities  Baseline: 11-28-22 Goal status: PARTIALLY MET  3.  Pt will demonstrate increased attention to detail on structured iADL tasks with 3 or less errors exhibited given occasional min A  Baseline: 12/05/22 Goal status:  PARTIALLY MET  4.  Pt will demonstrate increased safety awareness by verbalizing problem and solution prior to initiating task for 2/2 opportunities  Baseline: 11-19-22 Goal status: PARTIALLY MET  5.   Pt will utilize processing/attention strategies in short structured conversations to complete entirety of thought for  80% of messages given occasional min A  Baseline: 11-19-22, 11-21-22 Goal status: MET  LONG TERM GOALS: Target date: 05/27/2023 (new re-cert date)  Pt will successfully manage iADLs (medications, finances, appointments) with use of memory aids and compensations given supervision from wife Baseline:  Goal status: MET   2.  Pt will demonstrate improved safety awareness and attention to detail with less frequent A from wife to ID safety concerns and complete functional tasks given supervision from wife Baseline:  Goal status: MET   3.  Pt will utilize processing/attention strategies in extended unstructured conversations to complete entirety of thoughts for ~90% of messages given intermittent min A x2 sessions Baseline:  Goal status: IN PROGRESS (ongoing at recert)  4.  Pt will demonstrate [facility] verbal expression with ability to compensate for anomia/dysnomia with trained strategy given occasional min A in 30+ minute conversations x2 sessions Baseline: 04-29-23 Goal status: IN PROGRESS (ongoing at recert)   ASSESSMENT:  CLINICAL IMPRESSION: Patient is a 77 y.o. male who was seen today for cognitive linguistic rehabilitation following SDH from fall in July 2023. Presents with inconsistent improvements in cognition (memory, attention, executive functioning) and communication (expressive and receptive language) impacting his ability to independently manage his affairs and converse effectively. Conducting ongoing education and training with patient and wife to aid communication and cognitive functioning for increased carryover at home. Given functional impact and increased frustration  for patient and wife, pt would benefit from skilled ST intervention to optimize cognitive functioning to maximize return to prior baseline.   OBJECTIVE IMPAIRMENTS: include attention, memory, awareness, and executive functioning. These impairments are limiting patient from managing medications, managing finances, and household responsibilities. Factors affecting potential to achieve goals and functional outcome are ability to learn/carryover information. Patient will benefit from skilled SLP services to address above impairments and improve overall function.  REHAB POTENTIAL: Good  PLAN:  SLP FREQUENCY: 2x/week (self-scheduled 1x/week at last WC auth)  SLP DURATION: 12 weeks ( + 6 weeks for recert, +4 weeks for 2nd recert)  PLANNED INTERVENTIONS: Language facilitation, Environmental controls, Cueing hierachy, Cognitive reorganization, Internal/external aids, Functional tasks, Multimodal communication approach, SLP instruction and feedback, Compensatory strategies, and Patient/family education    Gracy Racer, CCC-SLP 04/29/2023, 11:18 AM

## 2023-04-29 ENCOUNTER — Ambulatory Visit: Payer: Worker's Compensation | Attending: Internal Medicine

## 2023-04-29 DIAGNOSIS — R41841 Cognitive communication deficit: Secondary | ICD-10-CM | POA: Insufficient documentation

## 2023-05-05 DIAGNOSIS — L448 Other specified papulosquamous disorders: Secondary | ICD-10-CM | POA: Diagnosis not present

## 2023-05-05 DIAGNOSIS — C44629 Squamous cell carcinoma of skin of left upper limb, including shoulder: Secondary | ICD-10-CM | POA: Diagnosis not present

## 2023-05-05 DIAGNOSIS — L814 Other melanin hyperpigmentation: Secondary | ICD-10-CM | POA: Diagnosis not present

## 2023-05-05 DIAGNOSIS — L820 Inflamed seborrheic keratosis: Secondary | ICD-10-CM | POA: Diagnosis not present

## 2023-05-05 DIAGNOSIS — D485 Neoplasm of uncertain behavior of skin: Secondary | ICD-10-CM | POA: Diagnosis not present

## 2023-05-05 DIAGNOSIS — L578 Other skin changes due to chronic exposure to nonionizing radiation: Secondary | ICD-10-CM | POA: Diagnosis not present

## 2023-05-05 DIAGNOSIS — C44329 Squamous cell carcinoma of skin of other parts of face: Secondary | ICD-10-CM | POA: Diagnosis not present

## 2023-05-05 DIAGNOSIS — L570 Actinic keratosis: Secondary | ICD-10-CM | POA: Diagnosis not present

## 2023-05-05 DIAGNOSIS — L538 Other specified erythematous conditions: Secondary | ICD-10-CM | POA: Diagnosis not present

## 2023-05-05 NOTE — Therapy (Unsigned)
OUTPATIENT SPEECH LANGUAGE PATHOLOGY TREATMENT (PROGRESS NOTE)  Patient Name: Evan [name] MRN: [id number] DOB:May 22, 1946, 77 y.o., male Today's Date: 05/06/2023  PCP: Marden Noble MD REFERRING PROVIDER: Tia Alert MD  END OF SESSION:  End of Session - 05/06/23 0936     Visit Number 30    Number of Visits 33    Date for SLP Re-Evaluation 05/27/23    Authorization Type [employer]    Authorization Time Period 8 additional ST visits approved 03/24/23    Authorization - Visit Number 5    Authorization - Number of Visits 8    SLP Start Time 0933    SLP Stop Time  1015    SLP Time Calculation (min) 42 min    Activity Tolerance Patient tolerated treatment well            Speech Therapy Progress Note  Dates of Reporting Period: 02-11-23 to current  Objective Reports of Subjective Statement: Pt has been seen for 30 ST visits targeting cognitive communication. Inconsistent progress exhibited thus far (see below). Neurology now involved but notes not accessible to SLP.   Objective Measurements: Pt continues with inconsistent performance and progress. Usually benefits from additional processing time and cues to aid attention, recall, and executive functioning for repetitive functional tasks. Caregiver education provided when able to optimize carryover.   Goal Update: goals checked at re-cert last session. see goals below  Plan: continue per POC   Reason Skilled Services are Required:Ongoing cognitive linguistic challenges    Past Medical History:  Diagnosis Date   Acquired dilation of ascending aorta and aortic root (HCC)    a.) measurements by TTE in 04/2021 --> root 40 mm and ascending aorta 43 mm   Anginal pain (HCC)    Anxiety    Atrial flutter (HCC) 04/22/2021   a.) CHADS2VASc = 5 (age x 2, HTN, T2DM, CAD); b.) daily warfarin   Carotid disease, bilateral (HCC)    a.) Doppler in 2019 --> 1-39% stenosis on RIGHT and 40-58% on LEFT    Cataracts, bilateral    Chronic anticoagulation    Warfarin   Chronic renal insufficiency    Coronary artery disease    a.) s/p 3v CABG in 1999   Depression    GERD (gastroesophageal reflux disease)    History of kidney stones    Hypercholesteremia    Hypertension    Kidney stone    Low testosterone    on TRT injections   Malignant melanoma (HCC)    a.) face, nose, left leg; b.) Tx'd with chemotherapy + X[facility] at [facility]   Myocardial infarction [facility])    Pulmonary emboli (HCC)    S/P CABG x 3 1999   a.) LIMA-LAD, SVG-RCA, SVG-diagnonal   Sleep apnea    not using nocturnal PAP therapy   T2DM (type 2 diabetes mellitus) (HCC)    Past Surgical History:  Procedure Laterality Date   CARDIAC CATHETERIZATION     x3     last 1999   CARDIOVERSION N/A 08/12/2021   Procedure: CARDIOVERSION;  Surgeon: Pricilla Riffle, MD;  Location: [facility] ENDOSCOPY;  Service: Cardiovascular;  Laterality: N/A;   CATARACT EXTRACTION Bilateral    COLONOSCOPY WITH PROPOFOL N/A 09/26/2014   Procedure: COLONOSCOPY WITH PROPOFOL;  Surgeon: Charolett Bumpers, MD;  Location: [facility] ENDOSCOPY;  Service: Endoscopy;  Laterality: N/A;   CORONARY ARTERY BYPASS GRAFT N/A 1999   3v; LIMA-LAD, SVG-RCA, SVG-diagonal   HOLEP-LASER ENUCLEATION OF THE PROSTATE WITH MORCELLATION  N/A 06/07/2021   Procedure: HOLEP-LASER ENUCLEATION OF THE PROSTATE WITH MORCELLATION;  Surgeon: Sondra Come, MD;  Location: [facility] ORS;  Service: Urology;  Laterality: N/A;   KNEE ARTHROSCOPY Left    LAPAROSCOPIC CHOLECYSTECTOMY     MASS EXCISION  04/15/2012   Procedure: EXCISION MASS;  Surgeon: Suzanna Obey, MD;  Location: [facility] OR;  Service: ENT;  Laterality: Left;  Left tonsil biopsy   melanomia     several skin ca removed-nose, left ankle, parotid gland left, right nodes-being followed annually..   Patient Active Problem List   Diagnosis Date Noted   Hypoglycemia 02/14/2023   SDH (subdural hematoma) (HCC) 04/11/2022   Fall (on)(from) sidewalk curb, initial  encounter 04/11/2022   History of melanoma excision 04/11/2022   Diabetes mellitus type 2 in obese 04/11/2022   DNR (do not resuscitate) 04/11/2022   Chronic anticoagulation 06/30/2021   Typical atrial flutter (HCC) 06/28/2021   Pure hypercholesterolemia 12/23/2013   Carotid artery disease (HCC) 12/23/2013   Essential hypertension, benign 12/23/2013   Angina decubitus 12/23/2013   Coronary artery disease    Cancer (HCC)    Depression    Pulmonary emboli (HCC)    Hypercholesteremia    Kidney stone    Chronic renal insufficiency     ONSET DATE: 04/11/2022 (referral=11/06/22)  REFERRING DIAG: S06.5XAA- Traumatic subdural hemorrhage with loss of consciousness   THERAPY DIAG: Cognitive communication deficit  Rationale for Evaluation and Treatment: Rehabilitation  SUBJECTIVE:   SUBJECTIVE STATEMENT: "My skin cancer is back" Pt accompanied by: self  PERTINENT HISTORY: 77 y.o. male presents to [facility] hospital on 7/14 after falling. Pt found to have small parafalcine SDH and right tentorial SDH , reporting nausea, HA, and dizziness. PMH:  includes anxiety, aflutter, depression, HTN, PE, CABG, DMII.   PAIN: Are you having pain? No  FALLS: Has patient fallen in last 6 months?  Yes, Number of falls: 3-4 (tripping)  PATIENT GOALS: "I want to be able to figure things out on my own. I want to confidently talk with people and not feel like I have to run and hide "   OBJECTIVE:   TODAY'S TREATMENT:                                                                                                                                         05-06-23: Reported accurate recall of all necessary items to leave home but did not hang visual aid in home per recommendation. Cued used of written to-do reminder for task completion, as pt successfully completed 2/3 to-do list items from last session. Endorsed feeling "stupid" due to disorganization and inability to locate specific text in phone. Cued slow rate and  reading/saying aloud to aid attention while searching in phone, which was effective. Usual mod A required for temporal orientation despite written and visual cues for date present.   04-29-23: Patient states that utilizing the calendar  at home with benefit of appointment management. Education provided on prepping for tasks/appointments ahead of time and budging time in order to get places on time. Targeted memory through external memory aids (ie. Notes) required frequent mod-A of immediate feedback to write specific notes to aid recall. Pt able to self-correct anomia x3 during today's session with extra time and use of description strategy. Targeted executive functioning by writing reminders/to do list for future doctor's visits. Patient required frequent redirection during today's session to remain on topic. Required semantic/gesture cues to go back in conversation and find what he was talking about.   04-22-23: Vague speech exhibited re: recent appointments and explanation of missed ST visits. Expressed frustration with reduced recall of specific information in this conversation. Tried to use written aid to prompt recall. Demonstrated reduced awareness of written cue that was present. Pt reported wife expressed improved thinking since last visit but unable to provide specifics. Given reported recall deficits, provided written visual aid to aid recall of necessary items to leave home (I.e., hearing aids). Provided recommendations for wife to aid attention (ex: providing specific/direct instruction; 1 task at a time). Targeted problem solving to optimize appointment management. Pt able to ID solution x1 but required max prompting to ID other solutions x2.   04-01-23: Returned with trial hearing aids in place, with pt reporting improvements in hearing acuity and some processing. Targeted thought organization, memory, and communication with functional note taking task. Frequent mod prompting required to aid thought  organization and recall prior to writing as pt was quick to write down verbatim responses. Exhibited difficulty differentiating between relevant and irrelevant information and writing cohesive reminder. Usual mod to max A required to write specific, detailed reminder and follow step-by-step instructions. Notable difficulty observed with recalling targeted term despite repetition and written cue. Unable to find written targeted term on paper despite max A. Consistent prompting required for usual vague responses, in which additional processing time was occasionally beneficial. Briefly discussed ongoing cognitive linguistic challenges with wife.   03-25-23: Patient states that a lot of things is going on in his life, and that it limits his thinking. Demonstrated difficulty word finding during unstructured conversation. Often able to ID targeted word with additional time or cue to aid attention to topic. Required occasional min-A of gestural and semantic cues. SLP provided education on creating plan to address issues as they come up. ST targeted communication in light attention and memory deficits through completing stories from story starters.  Patient required initial model, frequent guided questions, and visual aid of framework of story telling and feedback to improve responses. Patient's rating of responses matched SLP judgement with pt demonstrating awareness of reduced attention and tangential comments. Of note, at conclusion of SLP session, pt reported safety concern of using ladder to change light bulb while alone at home despite intellectual awareness of balance deficits and heightened fall risk.   03-16-23: Patient and SLP discusses miscommunication with previous two missed appointments. Patient advocated for himself and asked the front desk to write down his appointments for him. Patient has added names to his cheat sheet to find names of people who he has forgotten. SLP led through putting labels for each  parson to for clarity, and to add notes for clarity. Patient states that [name] is managing his pills and that he feels very safe with the system that they have set up at home. SLP reviewed goals with patient and updated progress towards goals. Education provided on speech therapy in addressing  word finding, memory, attention, and other cognitive tasks. Discussed medical history/medication to add to create a separate cheat sheet. Patient sent home to complete medical cheat sheet.   PATIENT EDUCATION: Education details: see above Person educated: Patient Education method: Programmer, multimedia, Demonstration, Verbal cues, and Handouts Education comprehension: verbalized understanding, returned demonstration, and needs further education   GOALS: Goals reviewed with patient? Yes  SHORT TERM GOALS: Target date: 12/05/2022  Complete CLQT and PROM first 1-2 sessions with thorough discussion of results to heighten pt awareness  Baseline:  Goal status: MET  2.  Pt will follow multi-step instructions to complete functional task with ~90% accuracy given occasional min A for 2/2 opportunities  Baseline: 11-28-22 Goal status: PARTIALLY MET  3.  Pt will demonstrate increased attention to detail on structured iADL tasks with 3 or less errors exhibited given occasional min A  Baseline: 12/05/22 Goal status: PARTIALLY MET  4.  Pt will demonstrate increased safety awareness by verbalizing problem and solution prior to initiating task for 2/2 opportunities  Baseline: 11-19-22 Goal status: PARTIALLY MET  5.   Pt will utilize processing/attention strategies in short structured conversations to complete entirety of thought for 80% of messages given occasional min A  Baseline: 11-19-22, 11-21-22 Goal status: MET  LONG TERM GOALS: Target date: 05/27/2023 (new re-cert date)  Pt will successfully manage iADLs (medications, finances, appointments) with use of memory aids and compensations given supervision from wife Baseline:   Goal status: MET   2.  Pt will demonstrate improved safety awareness and attention to detail with less frequent A from wife to ID safety concerns and complete functional tasks given supervision from wife Baseline:  Goal status: MET   3.  Pt will utilize processing/attention strategies in extended unstructured conversations to complete entirety of thoughts for ~90% of messages given intermittent min A x2 sessions Baseline:  Goal status: IN PROGRESS (ongoing at recert)  4.  Pt will demonstrate [facility] verbal expression with ability to compensate for anomia/dysnomia with trained strategy given occasional min A in 30+ minute conversations x2 sessions Baseline: 04-29-23 Goal status: IN PROGRESS (ongoing at recert)   ASSESSMENT:  CLINICAL IMPRESSION: Patient is a 77 y.o. male who was seen today for cognitive linguistic rehabilitation following SDH from fall in July 2023. Presents with inconsistent improvements in cognition (memory, attention, executive functioning) and communication (expressive and receptive language) impacting his ability to independently manage his affairs and converse effectively. Conducting ongoing education and training with patient and wife to aid communication and cognitive functioning for increased carryover at home. Given functional impact and increased frustration for patient and wife, pt would benefit from skilled ST intervention to optimize cognitive functioning to maximize return to prior baseline.   OBJECTIVE IMPAIRMENTS: include attention, memory, awareness, and executive functioning. These impairments are limiting patient from managing medications, managing finances, and household responsibilities. Factors affecting potential to achieve goals and functional outcome are ability to learn/carryover information. Patient will benefit from skilled SLP services to address above impairments and improve overall function.  REHAB POTENTIAL: Good  PLAN:  SLP FREQUENCY: 2x/week  (self-scheduled 1x/week at last WC auth)  SLP DURATION: 12 weeks ( + 6 weeks for recert, +4 weeks for 2nd recert)  PLANNED INTERVENTIONS: Language facilitation, Environmental controls, Cueing hierachy, Cognitive reorganization, Internal/external aids, Functional tasks, Multimodal communication approach, SLP instruction and feedback, Compensatory strategies, and Patient/family education    Gracy Racer, CCC-SLP 05/06/2023, 9:39 AM

## 2023-05-06 ENCOUNTER — Ambulatory Visit: Payer: Worker's Compensation | Attending: Internal Medicine

## 2023-05-06 DIAGNOSIS — R41841 Cognitive communication deficit: Secondary | ICD-10-CM | POA: Insufficient documentation

## 2023-05-06 DIAGNOSIS — I10 Essential (primary) hypertension: Secondary | ICD-10-CM | POA: Diagnosis not present

## 2023-05-06 DIAGNOSIS — E291 Testicular hypofunction: Secondary | ICD-10-CM | POA: Diagnosis not present

## 2023-05-06 DIAGNOSIS — I25119 Atherosclerotic heart disease of native coronary artery with unspecified angina pectoris: Secondary | ICD-10-CM | POA: Diagnosis not present

## 2023-05-06 DIAGNOSIS — E1122 Type 2 diabetes mellitus with diabetic chronic kidney disease: Secondary | ICD-10-CM | POA: Diagnosis not present

## 2023-05-06 DIAGNOSIS — Z8679 Personal history of other diseases of the circulatory system: Secondary | ICD-10-CM | POA: Diagnosis not present

## 2023-05-06 DIAGNOSIS — I483 Typical atrial flutter: Secondary | ICD-10-CM | POA: Diagnosis not present

## 2023-05-06 DIAGNOSIS — Z86711 Personal history of pulmonary embolism: Secondary | ICD-10-CM | POA: Diagnosis not present

## 2023-05-06 DIAGNOSIS — G4733 Obstructive sleep apnea (adult) (pediatric): Secondary | ICD-10-CM | POA: Diagnosis not present

## 2023-05-06 DIAGNOSIS — N1832 Chronic kidney disease, stage 3b: Secondary | ICD-10-CM | POA: Diagnosis not present

## 2023-05-07 ENCOUNTER — Other Ambulatory Visit: Payer: Self-pay | Admitting: Cardiology

## 2023-05-12 NOTE — Therapy (Unsigned)
OUTPATIENT SPEECH LANGUAGE PATHOLOGY TREATMENT (PROGRESS NOTE)  Patient Name: Evan [name] MRN: [id number] DOB:1946-07-12, 77 y.o., male Today's Date: 05/12/2023  PCP: Marden Noble MD REFERRING PROVIDER: Tia Alert MD  END OF SESSION:   Speech Therapy Progress Note  Dates of Reporting Period: 02-11-23 to current  Objective Reports of Subjective Statement: Pt has been seen for 30 ST visits targeting cognitive communication. Inconsistent progress exhibited thus far (see below). Neurology now involved but notes not accessible to SLP.   Objective Measurements: Pt continues with inconsistent performance and progress. Usually benefits from additional processing time and cues to aid attention, recall, and executive functioning for repetitive functional tasks. Caregiver education provided when able to optimize carryover.   Goal Update: goals checked at re-cert last session. see goals below  Plan: continue per POC   Reason Skilled Services are Required:Ongoing cognitive linguistic challenges    Past Medical History:  Diagnosis Date   Acquired dilation of ascending aorta and aortic root (HCC)    a.) measurements by TTE in 04/2021 --> root 40 mm and ascending aorta 43 mm   Anginal pain (HCC)    Anxiety    Atrial flutter (HCC) 04/22/2021   a.) CHADS2VASc = 5 (age x 2, HTN, T2DM, CAD); b.) daily warfarin   Carotid disease, bilateral (HCC)    a.) Doppler in 2019 --> 1-39% stenosis on RIGHT and 40-58% on LEFT   Cataracts, bilateral    Chronic anticoagulation    Warfarin   Chronic renal insufficiency    Coronary artery disease    a.) s/p 3v CABG in 1999   Depression    GERD (gastroesophageal reflux disease)    History of kidney stones    Hypercholesteremia    Hypertension    Kidney stone    Low testosterone    on TRT injections   Malignant melanoma (HCC)    a.) face, nose, left leg; b.) Tx'd with chemotherapy + XRT at [facility]   Myocardial infarction [facility])    Pulmonary  emboli (HCC)    S/P CABG x 3 1999   a.) LIMA-LAD, SVG-RCA, SVG-diagnonal   Sleep apnea    not using nocturnal PAP therapy   T2DM (type 2 diabetes mellitus) (HCC)    Past Surgical History:  Procedure Laterality Date   CARDIAC CATHETERIZATION     x3     last 1999   CARDIOVERSION N/A 08/12/2021   Procedure: CARDIOVERSION;  Surgeon: Pricilla Riffle, MD;  Location: [facility] ENDOSCOPY;  Service: Cardiovascular;  Laterality: N/A;   CATARACT EXTRACTION Bilateral    COLONOSCOPY WITH PROPOFOL N/A 09/26/2014   Procedure: COLONOSCOPY WITH PROPOFOL;  Surgeon: Charolett Bumpers, MD;  Location: [facility] ENDOSCOPY;  Service: Endoscopy;  Laterality: N/A;   CORONARY ARTERY BYPASS GRAFT N/A 1999   3v; LIMA-LAD, SVG-RCA, SVG-diagonal   HOLEP-LASER ENUCLEATION OF THE PROSTATE WITH MORCELLATION N/A 06/07/2021   Procedure: HOLEP-LASER ENUCLEATION OF THE PROSTATE WITH MORCELLATION;  Surgeon: Sondra Come, MD;  Location: [facility] ORS;  Service: Urology;  Laterality: N/A;   KNEE ARTHROSCOPY Left    LAPAROSCOPIC CHOLECYSTECTOMY     MASS EXCISION  04/15/2012   Procedure: EXCISION MASS;  Surgeon: Suzanna Obey, MD;  Location: [facility] OR;  Service: ENT;  Laterality: Left;  Left tonsil biopsy   melanomia     several skin ca removed-nose, left ankle, parotid gland left, right nodes-being followed annually..   Patient Active Problem List   Diagnosis Date Noted   Hypoglycemia 02/14/2023   SDH (subdural hematoma) (  HCC) 04/11/2022   Fall (on)(from) sidewalk curb, initial encounter 04/11/2022   History of melanoma excision 04/11/2022   Diabetes mellitus type 2 in obese 04/11/2022   DNR (do not resuscitate) 04/11/2022   Chronic anticoagulation 06/30/2021   Typical atrial flutter (HCC) 06/28/2021   Pure hypercholesterolemia 12/23/2013   Carotid artery disease (HCC) 12/23/2013   Essential hypertension, benign 12/23/2013   Angina decubitus 12/23/2013   Coronary artery disease    Cancer (HCC)    Depression    Pulmonary emboli (HCC)     Hypercholesteremia    Kidney stone    Chronic renal insufficiency     ONSET DATE: 04/11/2022 (referral=11/06/22)  REFERRING DIAG: S06.5XAA- Traumatic subdural hemorrhage with loss of consciousness   THERAPY DIAG: No diagnosis found.  Rationale for Evaluation and Treatment: Rehabilitation  SUBJECTIVE:   SUBJECTIVE STATEMENT: "*** Pt accompanied by: self  PERTINENT HISTORY: 77 y.o. male presents to [facility] hospital on 7/14 after falling. Pt found to have small parafalcine SDH and right tentorial SDH , reporting nausea, HA, and dizziness. PMH:  includes anxiety, aflutter, depression, HTN, PE, CABG, DMII.   PAIN: Are you having pain? No  FALLS: Has patient fallen in last 6 months?  Yes, Number of falls: 3-4 (tripping)  PATIENT GOALS: "I want to be able to figure things out on my own. I want to confidently talk with people and not feel like I have to run and hide "   OBJECTIVE:   TODAY'S TREATMENT:                                                                                                                                         05-13-23:  05-06-23: Reported accurate recall of all necessary items to leave home but did not hang visual aid in home per recommendation. Cued used of written to-do reminder for task completion, as pt successfully completed 2/3 to-do list items from last session. Endorsed feeling "stupid" due to disorganization and inability to locate specific text in phone. Cued slow rate and reading/saying aloud to aid attention while searching in phone, which was effective. Usual mod A required for temporal orientation despite written and visual cues for date present.   04-29-23: Patient states that utilizing the calendar at home with benefit of appointment management. Education provided on prepping for tasks/appointments ahead of time and budging time in order to get places on time. Targeted memory through external memory aids (ie. Notes) required frequent mod-A of immediate feedback  to write specific notes to aid recall. Pt able to self-correct anomia x3 during today's session with extra time and use of description strategy. Targeted executive functioning by writing reminders/to do list for future doctor's visits. Patient required frequent redirection during today's session to remain on topic. Required semantic/gesture cues to go back in conversation and find what he was talking about.   04-22-23: Vague speech exhibited re: recent appointments and  explanation of missed ST visits. Expressed frustration with reduced recall of specific information in this conversation. Tried to use written aid to prompt recall. Demonstrated reduced awareness of written cue that was present. Pt reported wife expressed improved thinking since last visit but unable to provide specifics. Given reported recall deficits, provided written visual aid to aid recall of necessary items to leave home (I.e., hearing aids). Provided recommendations for wife to aid attention (ex: providing specific/direct instruction; 1 task at a time). Targeted problem solving to optimize appointment management. Pt able to ID solution x1 but required max prompting to ID other solutions x2.   04-01-23: Returned with trial hearing aids in place, with pt reporting improvements in hearing acuity and some processing. Targeted thought organization, memory, and communication with functional note taking task. Frequent mod prompting required to aid thought organization and recall prior to writing as pt was quick to write down verbatim responses. Exhibited difficulty differentiating between relevant and irrelevant information and writing cohesive reminder. Usual mod to max A required to write specific, detailed reminder and follow step-by-step instructions. Notable difficulty observed with recalling targeted term despite repetition and written cue. Unable to find written targeted term on paper despite max A. Consistent prompting required for usual vague  responses, in which additional processing time was occasionally beneficial. Briefly discussed ongoing cognitive linguistic challenges with wife.   03-25-23: Patient states that a lot of things is going on in his life, and that it limits his thinking. Demonstrated difficulty word finding during unstructured conversation. Often able to ID targeted word with additional time or cue to aid attention to topic. Required occasional min-A of gestural and semantic cues. SLP provided education on creating plan to address issues as they come up. ST targeted communication in light attention and memory deficits through completing stories from story starters.  Patient required initial model, frequent guided questions, and visual aid of framework of story telling and feedback to improve responses. Patient's rating of responses matched SLP judgement with pt demonstrating awareness of reduced attention and tangential comments. Of note, at conclusion of SLP session, pt reported safety concern of using ladder to change light bulb while alone at home despite intellectual awareness of balance deficits and heightened fall risk.   03-16-23: Patient and SLP discusses miscommunication with previous two missed appointments. Patient advocated for himself and asked the front desk to write down his appointments for him. Patient has added names to his cheat sheet to find names of people who he has forgotten. SLP led through putting labels for each parson to for clarity, and to add notes for clarity. Patient states that [name] is managing his pills and that he feels very safe with the system that they have set up at home. SLP reviewed goals with patient and updated progress towards goals. Education provided on speech therapy in addressing word finding, memory, attention, and other cognitive tasks. Discussed medical history/medication to add to create a separate cheat sheet. Patient sent home to complete medical cheat sheet.   PATIENT  EDUCATION: Education details: see above Person educated: Patient Education method: Programmer, multimedia, Demonstration, Verbal cues, and Handouts Education comprehension: verbalized understanding, returned demonstration, and needs further education   GOALS: Goals reviewed with patient? Yes  SHORT TERM GOALS: Target date: 12/05/2022  Complete CLQT and PROM first 1-2 sessions with thorough discussion of results to heighten pt awareness  Baseline:  Goal status: MET  2.  Pt will follow multi-step instructions to complete functional task with ~90% accuracy given occasional min  A for 2/2 opportunities  Baseline: 11-28-22 Goal status: PARTIALLY MET  3.  Pt will demonstrate increased attention to detail on structured iADL tasks with 3 or less errors exhibited given occasional min A  Baseline: 12/05/22 Goal status: PARTIALLY MET  4.  Pt will demonstrate increased safety awareness by verbalizing problem and solution prior to initiating task for 2/2 opportunities  Baseline: 11-19-22 Goal status: PARTIALLY MET  5.   Pt will utilize processing/attention strategies in short structured conversations to complete entirety of thought for 80% of messages given occasional min A  Baseline: 11-19-22, 11-21-22 Goal status: MET  LONG TERM GOALS: Target date: 05/27/2023 (new re-cert date)  Pt will successfully manage iADLs (medications, finances, appointments) with use of memory aids and compensations given supervision from wife Baseline:  Goal status: MET   2.  Pt will demonstrate improved safety awareness and attention to detail with less frequent A from wife to ID safety concerns and complete functional tasks given supervision from wife Baseline:  Goal status: MET   3.  Pt will utilize processing/attention strategies in extended unstructured conversations to complete entirety of thoughts for ~90% of messages given intermittent min A x2 sessions Baseline:  Goal status: IN PROGRESS (ongoing at recert)  4.  Pt  will demonstrate [facility] verbal expression with ability to compensate for anomia/dysnomia with trained strategy given occasional min A in 30+ minute conversations x2 sessions Baseline: 04-29-23 Goal status: IN PROGRESS (ongoing at recert)   ASSESSMENT:  CLINICAL IMPRESSION: Patient is a 77 y.o. male who was seen today for cognitive linguistic rehabilitation following SDH from fall in July 2023. Presents with inconsistent improvements in cognition (memory, attention, executive functioning) and communication (expressive and receptive language) impacting his ability to independently manage his affairs and converse effectively. Conducting ongoing education and training with patient and wife to aid communication and cognitive functioning for increased carryover at home. Given functional impact and increased frustration for patient and wife, pt would benefit from skilled ST intervention to optimize cognitive functioning to maximize return to prior baseline.   OBJECTIVE IMPAIRMENTS: include attention, memory, awareness, and executive functioning. These impairments are limiting patient from managing medications, managing finances, and household responsibilities. Factors affecting potential to achieve goals and functional outcome are ability to learn/carryover information. Patient will benefit from skilled SLP services to address above impairments and improve overall function.  REHAB POTENTIAL: Good  PLAN:  SLP FREQUENCY: 2x/week (self-scheduled 1x/week at last WC auth)  SLP DURATION: 12 weeks ( + 6 weeks for recert, +4 weeks for 2nd recert)  PLANNED INTERVENTIONS: Language facilitation, Environmental controls, Cueing hierachy, Cognitive reorganization, Internal/external aids, Functional tasks, Multimodal communication approach, SLP instruction and feedback, Compensatory strategies, and Patient/family education    Gracy Racer, CCC-SLP 05/12/2023, 3:51 PM

## 2023-05-13 ENCOUNTER — Ambulatory Visit: Payer: Worker's Compensation

## 2023-05-13 DIAGNOSIS — R41841 Cognitive communication deficit: Secondary | ICD-10-CM

## 2023-05-13 NOTE — Patient Instructions (Addendum)
What is the problem? What is the solution? Ex: needs to wear a hat --> afraid to lose it --> where can I place it so I won't forget? Hook it on belt loop/place on knee/lay on table    Always double check as you stand up -- do I have hearing aids, glasses, phone, wallet, hat?  I am concerned that you are either not paying attention/understanding what you are reading. Read it multiple times and/or out loud to ensure you know what you are reading

## 2023-05-22 DIAGNOSIS — Z79899 Other long term (current) drug therapy: Secondary | ICD-10-CM | POA: Diagnosis not present

## 2023-05-26 NOTE — Therapy (Unsigned)
OUTPATIENT SPEECH LANGUAGE PATHOLOGY TREATMENT   Patient Name: Evan [name] MRN: [id number] DOB:[birth year], 77 y.o., male Today's Date: 05/27/2023  PCP: Marden Noble MD REFERRING PROVIDER: Tia Alert MD  END OF SESSION:  End of Session - 05/27/23 1023     Visit Number 32    Number of Visits 33    Date for SLP Re-Evaluation 05/27/23    Authorization Type [employer]    Authorization Time Period 8 additional ST visits approved 03/24/23    Authorization - Visit Number 7    Authorization - Number of Visits 8    SLP Start Time 1018    SLP Stop Time  1100    SLP Time Calculation (min) 42 min    Activity Tolerance Patient tolerated treatment well                Past Medical History:  Diagnosis Date   Acquired dilation of ascending aorta and aortic root (HCC)    a.) measurements by TTE in 04/2021 --> root 40 mm and ascending aorta 43 mm   Anginal pain (HCC)    Anxiety    Atrial flutter (HCC) 04/22/2021   a.) CHADS2VASc = 5 (age x 2, HTN, T2DM, CAD); b.) daily warfarin   Carotid disease, bilateral (HCC)    a.) Doppler in 2019 --> 1-39% stenosis on RIGHT and 40-58% on LEFT   Cataracts, bilateral    Chronic anticoagulation    Warfarin   Chronic renal insufficiency    Coronary artery disease    a.) s/p 3v CABG in 1999   Depression    GERD (gastroesophageal reflux disease)    History of kidney stones    Hypercholesteremia    Hypertension    Kidney stone    Low testosterone    on TRT injections   Malignant melanoma (HCC)    a.) face, nose, left leg; b.) Tx'd with chemotherapy + XRT at [facility]   Myocardial infarction [facility])    Pulmonary emboli (HCC)    S/P CABG x 3 1999   a.) LIMA-LAD, SVG-RCA, SVG-diagnonal   Sleep apnea    not using nocturnal PAP therapy   T2DM (type 2 diabetes mellitus) (HCC)    Past Surgical History:  Procedure Laterality Date   CARDIAC CATHETERIZATION     x3     last 1999   CARDIOVERSION N/A 08/12/2021    Procedure: CARDIOVERSION;  Surgeon: Pricilla Riffle, MD;  Location: [facility] ENDOSCOPY;  Service: Cardiovascular;  Laterality: N/A;   CATARACT EXTRACTION Bilateral    COLONOSCOPY WITH PROPOFOL N/A 09/26/2014   Procedure: COLONOSCOPY WITH PROPOFOL;  Surgeon: Charolett Bumpers, MD;  Location: [facility] ENDOSCOPY;  Service: Endoscopy;  Laterality: N/A;   CORONARY ARTERY BYPASS GRAFT N/A 1999   3v; LIMA-LAD, SVG-RCA, SVG-diagonal   HOLEP-LASER ENUCLEATION OF THE PROSTATE WITH MORCELLATION N/A 06/07/2021   Procedure: HOLEP-LASER ENUCLEATION OF THE PROSTATE WITH MORCELLATION;  Surgeon: Sondra Come, MD;  Location: [facility] ORS;  Service: Urology;  Laterality: N/A;   KNEE ARTHROSCOPY Left    LAPAROSCOPIC CHOLECYSTECTOMY     MASS EXCISION  04/15/2012   Procedure: EXCISION MASS;  Surgeon: Suzanna Obey, MD;  Location: [facility] OR;  Service: ENT;  Laterality: Left;  Left tonsil biopsy   melanomia     several skin ca removed-nose, left ankle, parotid gland left, right nodes-being followed annually..   Patient Active Problem List   Diagnosis Date Noted   Hypoglycemia 02/14/2023   SDH (subdural hematoma) (HCC)  04/11/2022   Fall (on)(from) sidewalk curb, initial encounter 04/11/2022   History of melanoma excision 04/11/2022   Diabetes mellitus type 2 in obese 04/11/2022   DNR (do not resuscitate) 04/11/2022   Chronic anticoagulation 06/30/2021   Typical atrial flutter (HCC) 06/28/2021   Pure hypercholesterolemia 12/23/2013   Carotid artery disease (HCC) 12/23/2013   Essential hypertension, benign 12/23/2013   Angina decubitus 12/23/2013   Coronary artery disease    Cancer (HCC)    Depression    Pulmonary emboli (HCC)    Hypercholesteremia    Kidney stone    Chronic renal insufficiency     ONSET DATE: 04/11/2022 (referral=11/06/22)  REFERRING DIAG: S06.5XAA- Traumatic subdural hemorrhage with loss of consciousness   THERAPY DIAG: Cognitive communication deficit  Rationale for Evaluation and Treatment:  Rehabilitation  SUBJECTIVE:   SUBJECTIVE STATEMENT: Pt explained accident leading to ST intervention to graduate student, but demonstrated difficulty recalling specific interventions addressed during the course of treatment.  Pt accompanied by: self  PERTINENT HISTORY: 77 y.o. male [facility] hospital on 7/[facility] after falling. Pt found to have small parafalcine SDH and right tentorial SDH , reporting nausea, HA, and dizziness. PMH:  includes anxiety, aflutter, depression, HTN, PE, CABG, DMII.   PAIN: Are you having pain? No  FALLS: Has patient fallen in last 6 months?  Yes, Number of falls: 3-4 (tripping)  PATIENT GOALS: "I want to be able to figure things out on my own. I want to confidently talk with people and not feel like I have to run and hide "   OBJECTIVE:   TODAY'S TREATMENT:                                                                                                                                         05-27-23: Despite repetitive instruction and use of visual aids, pt unable to complete targeted task due to impaired recall, attention, and awareness. Cognitive disconnect exhibited when explaining purpose of task. Demonstrated reduced recall of personal medical conditions. SLP cued pt to utilize memory aid with mod prompting to locate information. Did complete HEP as recommended, however, multiple errors noted which indicates reduced attention to detail and functional problem solving. Led patient in correction of errors with frequent max A to attend to all details and sequence appropriately. Often aware an error was present, but unable to reason independently to determine next appropriate step. Targeted meta-cognitive analysis of task to promote increased awareness and generalization. Pt able to ID some scenarios where learned techniques would be beneficial (see patient instructions).   05-13-23: In discussion re: recent medical events, pt appropriately used memory aid to support  recall and communication given initial SLP prompt. Anomia x3 in conversation benefited from extra time x2 and phonemic cue x1. Demonstrated reduced verbal problem solving for functional issues, requiring usual mod prompting to ID solutions (ex: how to avoid losing sunhat while in  community). Reported bringing all necessary items to therapy but did not follow written instruction on to-do list (hang on kitchen door). SLP questioned whether error related to cognition or comprehension. Targeted reading comprehension task, in which pt required rephrasing and repetition x2 to understood primary mod-complex instruction. For follow through of targeted task, pt completed with 50% accuracy with inconsistent errors. Deduced errors related to attention/recall. Re-educated with patient ways to optimize attention, particularly while reading to ensure accuracy (see pt instructions). Discussed inconsistent progress thus far, which pt agreed with. Re-iterated having spouse completing high-risk, consequential tasks (meds, bills) while maintaining pt involvement with low risk tasks at home. Recommended increased spousal supervision d/t inconsistent judgement and problem solving.   05-06-23: Reported accurate recall of all necessary items to leave home but did not hang visual aid in home per recommendation. Cued used of written to-do reminder for task completion, as pt successfully completed 2/3 to-do list items from last session. Endorsed feeling "stupid" due to disorganization and inability to locate specific text in phone. Cued slow rate and reading/saying aloud to aid attention while searching in phone, which was effective. Usual mod A required for temporal orientation despite written and visual cues for date present.   04-29-23: Patient states that utilizing the calendar at home with benefit of appointment management. Education provided on prepping for tasks/appointments ahead of time and budging time in order to get places on  time. Targeted memory through external memory aids (ie. Notes) required frequent mod-A of immediate feedback to write specific notes to aid recall. Pt able to self-correct anomia x3 during today's session with extra time and use of description strategy. Targeted executive functioning by writing reminders/to do list for future doctor's visits. Patient required frequent redirection during today's session to remain on topic. Required semantic/gesture cues to go back in conversation and find what he was talking about.   04-22-23: Vague speech exhibited re: recent appointments and explanation of missed ST visits. Expressed frustration with reduced recall of specific information in this conversation. Tried to use written aid to prompt recall. Demonstrated reduced awareness of written cue that was present. Pt reported wife expressed improved thinking since last visit but unable to provide specifics. Given reported recall deficits, provided written visual aid to aid recall of necessary items to leave home (I.e., hearing aids). Provided recommendations for wife to aid attention (ex: providing specific/direct instruction; 1 task at a time). Targeted problem solving to optimize appointment management. Pt able to ID solution x1 but required max prompting to ID other solutions x2.   04-01-23: Returned with trial hearing aids in place, with pt reporting improvements in hearing acuity and some processing. Targeted thought organization, memory, and communication with functional note taking task. Frequent mod prompting required to aid thought organization and recall prior to writing as pt was quick to write down verbatim responses. Exhibited difficulty differentiating between relevant and irrelevant information and writing cohesive reminder. Usual mod to max A required to write specific, detailed reminder and follow step-by-step instructions. Notable difficulty observed with recalling targeted term despite repetition and written  cue. Unable to find written targeted term on paper despite max A. Consistent prompting required for usual vague responses, in which additional processing time was occasionally beneficial. Briefly discussed ongoing cognitive linguistic challenges with wife.   03-25-23: Patient states that a lot of things is going on in his life, and that it limits his thinking. Demonstrated difficulty word finding during unstructured conversation. Often able to ID targeted word with additional time  or cue to aid attention to topic. Required occasional min-A of gestural and semantic cues. SLP provided education on creating plan to address issues as they come up. ST targeted communication in light attention and memory deficits through completing stories from story starters.  Patient required initial model, frequent guided questions, and visual aid of framework of story telling and feedback to improve responses. Patient's rating of responses matched SLP judgement with pt demonstrating awareness of reduced attention and tangential comments. Of note, at conclusion of SLP session, pt reported safety concern of using ladder to change light bulb while alone at home despite intellectual awareness of balance deficits and heightened fall risk.   03-16-23: Patient and SLP discusses miscommunication with previous two missed appointments. Patient advocated for himself and asked the front desk to write down his appointments for him. Patient has added names to his cheat sheet to find names of people who he has forgotten. SLP led through putting labels for each parson to for clarity, and to add notes for clarity. Patient states that [name] is managing his pills and that he feels very safe with the system that they have set up at home. SLP reviewed goals with patient and updated progress towards goals. Education provided on speech therapy in addressing word finding, memory, attention, and other cognitive tasks. Discussed medical history/medication to  add to create a separate cheat sheet. Patient sent home to complete medical cheat sheet.   PATIENT EDUCATION: Education details: see above Person educated: Patient Education method: Programmer, multimedia, Demonstration, Verbal cues, and Handouts Education comprehension: verbalized understanding, returned demonstration, and needs further education   GOALS: Goals reviewed with patient? Yes  SHORT TERM GOALS: Target date: 12/05/2022  Complete CLQT and PROM first 1-2 sessions with thorough discussion of results to heighten pt awareness  Baseline:  Goal status: MET  2.  Pt will follow multi-step instructions to complete functional task with ~90% accuracy given occasional min A for 2/2 opportunities  Baseline: 11-28-22 Goal status: PARTIALLY MET  3.  Pt will demonstrate increased attention to detail on structured iADL tasks with 3 or less errors exhibited given occasional min A  Baseline: 12/05/22 Goal status: PARTIALLY MET  4.  Pt will demonstrate increased safety awareness by verbalizing problem and solution prior to initiating task for 2/2 opportunities  Baseline: 11-19-22 Goal status: PARTIALLY MET  5.   Pt will utilize processing/attention strategies in short structured conversations to complete entirety of thought for 80% of messages given occasional min A  Baseline: 11-19-22, 11-21-22 Goal status: MET  LONG TERM GOALS: Target date: 05/27/2023 (new re-cert date)  Pt will successfully manage iADLs (medications, finances, appointments) with use of memory aids and compensations given supervision from wife Baseline:  Goal status: MET   2.  Pt will demonstrate improved safety awareness and attention to detail with less frequent A from wife to ID safety concerns and complete functional tasks given supervision from wife Baseline:  Goal status: MET   3.  Pt will utilize processing/attention strategies in extended unstructured conversations to complete entirety of thoughts for ~90% of messages given  intermittent min A x2 sessions Baseline:  Goal status: IN PROGRESS (ongoing at recert)  4.  Pt will demonstrate [facility] verbal expression with ability to compensate for anomia/dysnomia with trained strategy given occasional min A in 30+ minute conversations x2 sessions Baseline: 04-29-23 Goal status: IN PROGRESS (ongoing at recert)   ASSESSMENT:  CLINICAL IMPRESSION: Patient is a 77 y.o. male who was seen today for cognitive linguistic rehabilitation  following SDH from fall in July 2023. Presents with inconsistent improvements in cognition (memory, attention, executive functioning) and communication (expressive and receptive language) impacting his ability to independently manage his affairs and converse effectively. Conducting ongoing education and training with patient and wife to aid communication and cognitive functioning for increased carryover at home. Given functional impact and increased frustration for patient and wife, pt would benefit from skilled ST intervention to optimize cognitive functioning to maximize return to prior baseline.   OBJECTIVE IMPAIRMENTS: include attention, memory, awareness, and executive functioning. These impairments are limiting patient from managing medications, managing finances, and household responsibilities. Factors affecting potential to achieve goals and functional outcome are ability to learn/carryover information. Patient will benefit from skilled SLP services to address above impairments and improve overall function.  REHAB POTENTIAL: Good  PLAN:  SLP FREQUENCY: 2x/week (self-scheduled 1x/week at last WC auth)  SLP DURATION: 12 weeks ( + 6 weeks for recert, +4 weeks for 2nd recert)  PLANNED INTERVENTIONS: Language facilitation, Environmental controls, Cueing hierachy, Cognitive reorganization, Internal/external aids, Functional tasks, Multimodal communication approach, SLP instruction and feedback, Compensatory strategies, and Patient/family  education    Whitney Muse, Student-SLP 05/27/2023, 11:53 AM

## 2023-05-26 NOTE — Progress Notes (Unsigned)
Office Visit    Patient Name: Evan [name] Date of Encounter: 05/28/2023  Primary Care Provider:  Emilio Aspen, MD Primary Cardiologist:  Evan Schultz, MD Primary Electrophysiologist: None   Past Medical History    Past Medical History:  Diagnosis Date   Acquired dilation of ascending aorta and aortic root (HCC)    a.) measurements by TTE in 04/2021 --> root 40 mm and ascending aorta 43 mm   Anginal pain (HCC)    Anxiety    Atrial flutter (HCC) 04/22/2021   a.) CHADS2VASc = 5 (age x 2, HTN, T2DM, CAD); b.) daily warfarin   Carotid disease, bilateral (HCC)    a.) Doppler in 2019 --> 1-39% stenosis on RIGHT and 40-58% on LEFT   Cataracts, bilateral    Chronic anticoagulation    Warfarin   Chronic renal insufficiency    Coronary artery disease    a.) s/p 3v CABG in 1999   Depression    GERD (gastroesophageal reflux disease)    History of kidney stones    Hypercholesteremia    Hypertension    Kidney stone    Low testosterone    on TRT injections   Malignant melanoma (HCC)    a.) face, nose, left leg; b.) Tx'd with chemotherapy + XRT at [facility]   Myocardial infarction [facility])    Pulmonary emboli (HCC)    S/P CABG x 3 1999   a.) LIMA-LAD, SVG-RCA, SVG-diagnonal   Sleep apnea    not using nocturnal PAP therapy   T2DM (type 2 diabetes mellitus) (HCC)    Past Surgical History:  Procedure Laterality Date   CARDIAC CATHETERIZATION     x3     last 1999   CARDIOVERSION N/A 08/12/2021   Procedure: CARDIOVERSION;  Surgeon: Evan Riffle, MD;  Location: [facility] ENDOSCOPY;  Service: Cardiovascular;  Laterality: N/A;   CATARACT EXTRACTION Bilateral    COLONOSCOPY WITH PROPOFOL N/A 09/26/2014   Procedure: COLONOSCOPY WITH PROPOFOL;  Surgeon: Evan Bumpers, MD;  Location: [facility] ENDOSCOPY;  Service: Endoscopy;  Laterality: N/A;   CORONARY ARTERY BYPASS GRAFT N/A 1999   3v; LIMA-LAD, SVG-RCA, SVG-diagonal   HOLEP-LASER ENUCLEATION OF THE PROSTATE WITH MORCELLATION N/A  06/07/2021   Procedure: HOLEP-LASER ENUCLEATION OF THE PROSTATE WITH MORCELLATION;  Surgeon: Evan Come, MD;  Location: [facility] ORS;  Service: Urology;  Laterality: N/A;   KNEE ARTHROSCOPY Left    LAPAROSCOPIC CHOLECYSTECTOMY     MASS EXCISION  04/15/2012   Procedure: EXCISION MASS;  Surgeon: Evan Obey, MD;  Location: [facility] OR;  Service: ENT;  Laterality: Left;  Left tonsil biopsy   melanomia     several skin ca removed-nose, left ankle, parotid gland left, right nodes-being followed annually..    Allergies  No Active Allergies   History of Present Illness    Evan [name]  is a 77 year old male with a PMH of CAD s/p CABG 1999, typical atrial flutter (on Coumadin) HLD, HTN, melanoma (in remission), carotid artery disease, DM type II, bilateral pulmonary embolism, sleep apnea (not on CPAP), who presents today for 1 year follow-up.  Evan [name] was seen initially by Evan [name] since 2015 for management of coronary artery disease.  He was diagnosed with typical atrial flutter during preoperative clearance visit for prostate procedure in 03/2021.  He underwent 2D echo that showed LVEF 55-60%, no RWMA, LV mildly dilated, mild LVH, LA mild to moderately dilated, trivial MR, mild aortic sclerosis without stenosis, mild dilation aortic  root 40mm and ascending aorta 43mm.  He was rate controlled with beta-blocker and DCCV was delayed for 1 month in order to complete prostate procedure.  He underwent DCCV on 08/13/2019 3:02 weeks of uninterrupted warfarin and was converted to sinus rhythm.  He was seen in follow-up in the AF clinic on 09/10/2021 with no changes made to his medications.  Seen in the ED on 01/2023 for complaint of hypoglycemia.  Since last being seen in the office patient reports that he is experiencing some episodes of dizziness and lightheadedness.  He reports the symptoms occurring more prominently when standing from a sitting position and turning his head suddenly.  He denies any  vision changes or headaches with the symptoms.  He denies any palpitations or chest pain with these symptoms.  His blood pressure today is 96/59 however blood pressures at home have been running in the 130s to 140s.  He is staying active at home and is using a push mower and water with his wife flowers.  During today's visit we discussed some of the possible side effects of beta-blockers and patient had all questions answered to satisfaction.  Patient denies chest pain, palpitations, dyspnea, PND, orthopnea, nausea, vomiting, dizziness, syncope, edema, weight gain, or early satiety.   Home Medications    Current Outpatient Medications  Medication Sig Dispense Refill   citalopram (CELEXA) 40 MG tablet Take 40 mg by mouth every morning.     fenofibrate micronized (LOFIBRA) 67 MG capsule TAKE 1 CAPSULE DAILY 90 capsule 0   JARDIANCE 10 MG TABS tablet Take 10 mg by mouth daily.     metoprolol succinate (TOPROL-XL) 25 MG 24 hr tablet Take 25 mg by mouth daily.     Multiple Vitamin (MULTI VITAMIN MENS PO) Take by mouth daily at 6 (six) AM.     nitroGLYCERIN (NITROSTAT) 0.4 MG SL tablet Place 0.4 mg under the tongue every 5 (five) minutes as needed for chest pain.     omega-3 acid ethyl esters (LOVAZA) 1 g capsule Take 2 capsules (2 g total) by mouth 2 (two) times daily. 360 capsule 1   polyethylene glycol (MIRALAX / GLYCOLAX) 17 g packet Take 17 g by mouth daily as needed for mild constipation. 14 each 0   rosuvastatin (CRESTOR) 20 MG tablet Take 20 mg by mouth every evening.     warfarin (COUMADIN) 5 MG tablet Take 0.5-1 tablets (2.5-5 mg total) by mouth See admin instructions. As directed--1/2 tab ( 2.5 mg)  every Monday,Tuesday, and Wednesday; 1 tab  ( 5 mg)  all other days (Patient taking differently: Take 5 mg by mouth See admin instructions.)     No current facility-administered medications for this visit.     Review of Systems  Please see the history of present illness.    (+) Dizziness (+)  Lightheadedness  All other systems reviewed and are otherwise negative except as noted above.  Physical Exam    Wt Readings from Last 3 Encounters:  05/28/23 196 lb 12.8 oz (89.3 kg)  02/14/23 210 lb (95.3 kg)  01/11/23 207 lb (93.9 kg)   VS: Vitals:   05/28/23 1132  BP: (!) 96/59  Pulse: 63  SpO2: 95%  ,Body mass index is 29.06 kg/m.  Constitutional:      Appearance: Healthy appearance. Not in distress.  Neck:     Vascular: JVD normal.  Pulmonary:     Effort: Pulmonary effort is normal.     Breath sounds: No wheezing. No rales. Diminished  in the bases Cardiovascular:     Normal rate. Regular rhythm. Normal S1. Normal S2.      Murmurs: There is no murmur.  Edema:    Peripheral edema absent.  Abdominal:     Palpations: Abdomen is soft non tender. There is no hepatomegaly.  Skin:    General: Skin is warm and dry.  Neurological:     General: No focal deficit present.     Mental Status: Alert and oriented to person, place and time.     Cranial Nerves: Cranial nerves are intact.  EKG/LABS/ Recent Cardiac Studies    ECG personally reviewed by me today -none completed today   Risk Assessment/Calculations:    CHA2DS2-VASc Score = 4   This indicates a 4.8% annual risk of stroke. The patient's score is based upon: CHF History: 0 HTN History: 1 Diabetes History: 1 Stroke History: 0 Vascular Disease History: 0 Age Score: 2 Gender Score: 0           Lab Results  Component Value Date   WBC 8.0 02/14/2023   HGB 16.4 02/14/2023   HCT 51.1 02/14/2023   MCV 93.4 02/14/2023   PLT 176 02/14/2023   Lab Results  Component Value Date   CREATININE 1.89 (H) 02/14/2023   BUN 35 (H) 02/14/2023   NA 133 (L) 02/14/2023   K 5.1 02/14/2023   CL 104 02/14/2023   CO2 22 02/14/2023   Lab Results  Component Value Date   ALT 24 02/14/2023   AST 26 02/14/2023   ALKPHOS 47 02/14/2023   BILITOT 0.3 02/14/2023   No results found for: "CHOL", "HDL", "LDLCALC",  "LDLDIRECT", "TRIG", "CHOLHDL"  Lab Results  Component Value Date   HGBA1C 5.9 (H) 02/14/2023     Assessment & Plan    1.  Typical atrial flutter: -s/p DCCV on 07/2021 and currently rate controlled with Toprol-XL -Patient is currently on Coumadin and followed by Coumadin clinic -Patient denies any breakthrough palpitations. -CHA2DS2-VASc Score = 4 [CHF History: 0, HTN History: 1, Diabetes History: 1, Stroke History: 0, Vascular Disease History: 0, Age Score: 2, Gender Score: 0].  Therefore, the patient's annual risk of stroke is 4.8 %.       2.  Coronary artery disease: -CAD/CABG 1999 today patient denies any chest pain or anginal equivalent with activities. -Continue current GDMT with Toprol-XL 25 mg, Crestor 20 mg daily, Lovaza 1g twice daily  3.  Essential hypertension: -Patient's blood pressure today was controlled at 96/59 -Continue Toprol-XL 25 mg  4.  Carotid artery disease: -Bilateral carotid disease with 1-39% on the right and 40-59% on the left -Continue GDMT with Crestor 20 mg daily  5.  Dizziness: -Patient reports dizziness with position changes and turning his head suddenly -We discussed in detail the side effects of beta-blockers and patient is aware to change position slowly to avoid dizziness -Patient was also encouraged to try compression stockings dizziness persist we may decrease dose of Toprol-XL to 12.5 mg daily     Disposition: Follow-up with Evan Schultz, MD or APP in 3 months    Medication Adjustments/Labs and Tests Ordered: Current medicines are reviewed at length with the patient today.  Concerns regarding medicines are outlined above.   Signed, Napoleon Form, Leodis Rains, NP 05/28/2023, 12:39 PM [facility]

## 2023-05-27 ENCOUNTER — Ambulatory Visit: Payer: Worker's Compensation | Attending: Internal Medicine

## 2023-05-27 DIAGNOSIS — R41841 Cognitive communication deficit: Secondary | ICD-10-CM | POA: Insufficient documentation

## 2023-05-27 NOTE — Patient Instructions (Addendum)
Take Aways Double check everything you do  Pay attention Reduce distractibility  Read things aloud multiple times

## 2023-05-28 ENCOUNTER — Ambulatory Visit: Payer: Medicare PPO | Attending: Cardiology | Admitting: Nurse Practitioner

## 2023-05-28 ENCOUNTER — Encounter: Payer: Self-pay | Admitting: Nurse Practitioner

## 2023-05-28 VITALS — BP 96/59 | HR 63 | Ht 69.0 in | Wt 196.8 lb

## 2023-05-28 DIAGNOSIS — E7800 Pure hypercholesterolemia, unspecified: Secondary | ICD-10-CM | POA: Diagnosis not present

## 2023-05-28 DIAGNOSIS — I10 Essential (primary) hypertension: Secondary | ICD-10-CM | POA: Diagnosis not present

## 2023-05-28 DIAGNOSIS — R42 Dizziness and giddiness: Secondary | ICD-10-CM

## 2023-05-28 DIAGNOSIS — L814 Other melanin hyperpigmentation: Secondary | ICD-10-CM | POA: Diagnosis not present

## 2023-05-28 DIAGNOSIS — D225 Melanocytic nevi of trunk: Secondary | ICD-10-CM | POA: Diagnosis not present

## 2023-05-28 DIAGNOSIS — L821 Other seborrheic keratosis: Secondary | ICD-10-CM | POA: Diagnosis not present

## 2023-05-28 DIAGNOSIS — Z85820 Personal history of malignant melanoma of skin: Secondary | ICD-10-CM | POA: Diagnosis not present

## 2023-05-28 DIAGNOSIS — I2510 Atherosclerotic heart disease of native coronary artery without angina pectoris: Secondary | ICD-10-CM | POA: Diagnosis not present

## 2023-05-28 DIAGNOSIS — D0439 Carcinoma in situ of skin of other parts of face: Secondary | ICD-10-CM | POA: Diagnosis not present

## 2023-05-28 DIAGNOSIS — I779 Disorder of arteries and arterioles, unspecified: Secondary | ICD-10-CM | POA: Diagnosis not present

## 2023-05-28 DIAGNOSIS — I483 Typical atrial flutter: Secondary | ICD-10-CM | POA: Diagnosis not present

## 2023-05-28 DIAGNOSIS — S20469A Insect bite (nonvenomous) of unspecified back wall of thorax, initial encounter: Secondary | ICD-10-CM | POA: Diagnosis not present

## 2023-05-28 DIAGNOSIS — L820 Inflamed seborrheic keratosis: Secondary | ICD-10-CM | POA: Diagnosis not present

## 2023-05-28 DIAGNOSIS — C44629 Squamous cell carcinoma of skin of left upper limb, including shoulder: Secondary | ICD-10-CM | POA: Diagnosis not present

## 2023-05-28 DIAGNOSIS — Z08 Encounter for follow-up examination after completed treatment for malignant neoplasm: Secondary | ICD-10-CM | POA: Diagnosis not present

## 2023-05-28 NOTE — Patient Instructions (Signed)
Medication Instructions:  Your physician recommends that you continue on your current medications as directed. Please refer to the Current Medication list given to you today. *If you need a refill on your cardiac medications before your next appointment, please call your pharmacy*   Lab Work: None Ordered   Testing/Procedures: None ordered   Follow-Up: At [facility], you and your health needs are our priority.  As part of our continuing mission to provide you with exceptional heart care, we have created designated Provider Care Teams.  These Care Teams include your primary Cardiologist (physician) and Advanced Practice Providers (APPs -  Physician Assistants and Nurse Practitioners) who all work together to provide you with the care you need, when you need it.  We recommend signing up for the patient portal called "MyChart".  Sign up information is provided on this After Visit Summary.  MyChart is used to connect with patients for Virtual Visits (Telemedicine).  Patients are able to view lab/test results, encounter notes, upcoming appointments, etc.  Non-urgent messages can be sent to your provider as well.   To learn more about what you can do with MyChart, go to [email or web address].    Your next appointment:   3 month(s)  Provider:   Robin Searing, NP        Other Instructions Get some compression hose  Call office if you still experience dizziness after wearing compressions Change positions slowly Check your blood pressure daily for 2 weeks, then contact the office with your readings.  Make sure to check 2 hours after your medications.   AVOID these things for 30 minutes before checking your blood pressure: No Drinking caffeine. No Drinking alcohol. No Eating. No Smoking. No Exercising.  Five minutes before checking your blood pressure: Pee. Sit in a dining chair. Avoid sitting in a soft couch or armchair. Be quiet. Do not talk.

## 2023-05-31 ENCOUNTER — Other Ambulatory Visit: Payer: Self-pay | Admitting: Cardiology

## 2023-06-03 ENCOUNTER — Ambulatory Visit: Payer: Medicare PPO

## 2023-06-23 DIAGNOSIS — Z86711 Personal history of pulmonary embolism: Secondary | ICD-10-CM | POA: Diagnosis not present

## 2023-06-23 DIAGNOSIS — I25119 Atherosclerotic heart disease of native coronary artery with unspecified angina pectoris: Secondary | ICD-10-CM | POA: Diagnosis not present

## 2023-06-23 DIAGNOSIS — E291 Testicular hypofunction: Secondary | ICD-10-CM | POA: Diagnosis not present

## 2023-06-23 DIAGNOSIS — Z23 Encounter for immunization: Secondary | ICD-10-CM | POA: Diagnosis not present

## 2023-06-23 DIAGNOSIS — E1122 Type 2 diabetes mellitus with diabetic chronic kidney disease: Secondary | ICD-10-CM | POA: Diagnosis not present

## 2023-06-23 DIAGNOSIS — I10 Essential (primary) hypertension: Secondary | ICD-10-CM | POA: Diagnosis not present

## 2023-06-23 DIAGNOSIS — I483 Typical atrial flutter: Secondary | ICD-10-CM | POA: Diagnosis not present

## 2023-06-23 DIAGNOSIS — Z7901 Long term (current) use of anticoagulants: Secondary | ICD-10-CM | POA: Diagnosis not present

## 2023-06-23 DIAGNOSIS — N1832 Chronic kidney disease, stage 3b: Secondary | ICD-10-CM | POA: Diagnosis not present

## 2023-07-07 DIAGNOSIS — E291 Testicular hypofunction: Secondary | ICD-10-CM | POA: Diagnosis not present

## 2023-07-21 DIAGNOSIS — E291 Testicular hypofunction: Secondary | ICD-10-CM | POA: Diagnosis not present

## 2023-07-23 DIAGNOSIS — Z85828 Personal history of other malignant neoplasm of skin: Secondary | ICD-10-CM | POA: Diagnosis not present

## 2023-07-23 DIAGNOSIS — Z09 Encounter for follow-up examination after completed treatment for conditions other than malignant neoplasm: Secondary | ICD-10-CM | POA: Diagnosis not present

## 2023-07-23 DIAGNOSIS — Z86007 Personal history of in-situ neoplasm of skin: Secondary | ICD-10-CM | POA: Diagnosis not present

## 2023-07-23 DIAGNOSIS — Z08 Encounter for follow-up examination after completed treatment for malignant neoplasm: Secondary | ICD-10-CM | POA: Diagnosis not present

## 2023-07-23 DIAGNOSIS — L821 Other seborrheic keratosis: Secondary | ICD-10-CM | POA: Diagnosis not present

## 2023-07-23 DIAGNOSIS — L570 Actinic keratosis: Secondary | ICD-10-CM | POA: Diagnosis not present

## 2023-09-01 NOTE — Progress Notes (Unsigned)
Cardiology Office Note    Patient Name: Evan [name] Date of Encounter: 09/01/2023  Primary Care Provider:  Emilio Aspen, MD Primary Cardiologist:  Donato Schultz, MD Primary Electrophysiologist: None   Past Medical History    Past Medical History:  Diagnosis Date   Acquired dilation of ascending aorta and aortic root (HCC)    a.) measurements by TTE in 04/2021 --> root 40 mm and ascending aorta 43 mm   Anginal pain (HCC)    Anxiety    Atrial flutter (HCC) 04/22/2021   a.) CHADS2VASc = 5 (age x 2, HTN, T2DM, CAD); b.) daily warfarin   Carotid disease, bilateral (HCC)    a.) Doppler in 2019 --> 1-39% stenosis on RIGHT and 40-58% on LEFT   Cataracts, bilateral    Chronic anticoagulation    Warfarin   Chronic renal insufficiency    Coronary artery disease    a.) s/p 3v CABG in 1999   Depression    GERD (gastroesophageal reflux disease)    History of kidney stones    Hypercholesteremia    Hypertension    Kidney stone    Low testosterone    on TRT injections   Malignant melanoma (HCC)    a.) face, nose, left leg; b.) Tx'd with chemotherapy + XRT at [facility]   Myocardial infarction [facility])    Pulmonary emboli (HCC)    S/P CABG x 3 1999   a.) LIMA-LAD, SVG-RCA, SVG-diagnonal   Sleep apnea    not using nocturnal PAP therapy   T2DM (type 2 diabetes mellitus) (HCC)     History of Present Illness   Evan [name]  is a 77 year old male with a PMH of CAD s/p CABG 1999, typical atrial flutter (on Coumadin) HLD, HTN, melanoma (in remission), carotid artery disease, DM type II, bilateral pulmonary embolism, sleep apnea (not on CPAP), who presents today for 78-month follow-up.  Mr. Tambasco was last seen on 05/28/2023 for annual follow-up.  During visit patient reported episodes of dizziness that occurred predominantly when standing and when turning his head suddenly.  He denied any palpitations or chest pain.  His blood pressure was stable but low in the 90s over 50s.   He was encouraged to change positions slowly and to try compression stocks with plan to decrease Toprol to 12.5 mg if symptoms persist.  During today's visit the patient reports*** .  Patient denies chest pain, palpitations, dyspnea, PND, orthopnea, nausea, vomiting, dizziness, syncope, edema, weight gain, or early satiety.  ***Notes: -Last ischemic evaluation: -Last echo: -Interim ED visits: Review of Systems  Please see the history of present illness.    All other systems reviewed and are otherwise negative except as noted above.  Physical Exam    Wt Readings from Last 3 Encounters:  05/28/23 196 lb 12.8 oz (89.3 kg)  02/14/23 210 lb (95.3 kg)  01/11/23 207 lb (93.9 kg)   MV:HQION were no vitals filed for this visit.,There is no height or weight on file to calculate BMI. GEN: Well nourished, well developed in no acute distress Neck: No JVD; No carotid bruits Pulmonary: Clear to auscultation without rales, wheezing or rhonchi  Cardiovascular: Normal rate. Regular rhythm. Normal S1. Normal S2.   Murmurs: There is no murmur.  ABDOMEN: Soft, non-tender, non-distended EXTREMITIES:  No edema; No deformity   EKG/LABS/ Recent Cardiac Studies   ECG personally reviewed by me today - ***  Risk Assessment/Calculations:   {Does this patient have ATRIAL FIBRILLATION?[phone]}  Lab Results  Component Value Date   WBC 8.0 02/14/2023   HGB 16.4 02/14/2023   HCT 51.1 02/14/2023   MCV 93.4 02/14/2023   PLT 176 02/14/2023   Lab Results  Component Value Date   CREATININE 1.89 (H) 02/14/2023   BUN 35 (H) 02/14/2023   NA 133 (L) 02/14/2023   K 5.1 02/14/2023   CL 104 02/14/2023   CO2 22 02/14/2023   No results found for: "CHOL", "HDL", "LDLCALC", "LDLDIRECT", "TRIG", "CHOLHDL"  Lab Results  Component Value Date   HGBA1C 5.9 (H) 02/14/2023   Assessment & Plan    1.Typical atrial flutter: -s/p DCCV on 07/2021 and currently rate controlled with Toprol-XL -Patient is  currently on Coumadin and followed by Coumadin clinic  2.Coronary artery disease: -CAD/CABG 1999 today patient denie  3.Carotid artery disease: -Bilateral carotid disease with 1-39% on the right and 40-59% on the left -Continue GDMT with Crestor 20 mg daily  4.Dizziness: -Patient reports   5.  Essential hypertension: -Patient's blood pressure today was***      Disposition: Follow-up with Donato Schultz, MD or APP in *** months {Are you ordering a CV Procedure (e.g. stress test, cath, DCCV, TEE, etc)?   Press F2        :[id number]}   Signed, Napoleon Form, Leodis Rains, NP 09/01/2023, 4:48 PM [facility]

## 2023-09-02 ENCOUNTER — Ambulatory Visit: Payer: PRIVATE HEALTH INSURANCE | Attending: Nurse Practitioner | Admitting: Nurse Practitioner

## 2023-09-02 DIAGNOSIS — I2510 Atherosclerotic heart disease of native coronary artery without angina pectoris: Secondary | ICD-10-CM

## 2023-09-02 DIAGNOSIS — I779 Disorder of arteries and arterioles, unspecified: Secondary | ICD-10-CM

## 2023-09-02 DIAGNOSIS — I483 Typical atrial flutter: Secondary | ICD-10-CM

## 2023-09-02 DIAGNOSIS — I10 Essential (primary) hypertension: Secondary | ICD-10-CM

## 2023-09-02 DIAGNOSIS — R42 Dizziness and giddiness: Secondary | ICD-10-CM

## 2023-11-04 NOTE — Therapy (Signed)
 [facility] [address] [place], [place], [address] Phone: [phone]   Fax:  [phone]  Patient Details  Name: Evan [name] MRN: [id number] Date of Birth: [birth year] Referring Provider:  No ref. provider found  Encounter Date: 11/04/2023  SPEECH THERAPY DISCHARGE SUMMARY  Visits from Start of Care: 32  Current functional level related to goals / functional outcomes: Unknown as pt did not return after last ST visit   Remaining deficits: Unknown   Education / Equipment: Cognitive strategies, communication compensations, caregiver education   Patient agrees to discharge. Patient goals were partially met. Patient is being discharged due to not returning since the last visit.[name], CCC-SLP 11/04/2023, 9:57 AM  [facility] [address] [place], [place], [address] Phone: [phone]   Fax:  [phone]

## 2023-11-06 DIAGNOSIS — D751 Secondary polycythemia: Secondary | ICD-10-CM | POA: Diagnosis not present

## 2023-11-06 DIAGNOSIS — N1832 Chronic kidney disease, stage 3b: Secondary | ICD-10-CM | POA: Diagnosis not present

## 2023-11-06 DIAGNOSIS — Z7901 Long term (current) use of anticoagulants: Secondary | ICD-10-CM | POA: Diagnosis not present

## 2023-11-06 DIAGNOSIS — E1159 Type 2 diabetes mellitus with other circulatory complications: Secondary | ICD-10-CM | POA: Diagnosis not present

## 2023-11-06 DIAGNOSIS — E1122 Type 2 diabetes mellitus with diabetic chronic kidney disease: Secondary | ICD-10-CM | POA: Diagnosis not present

## 2023-11-06 DIAGNOSIS — E559 Vitamin D deficiency, unspecified: Secondary | ICD-10-CM | POA: Diagnosis not present

## 2023-11-13 DIAGNOSIS — Z8679 Personal history of other diseases of the circulatory system: Secondary | ICD-10-CM | POA: Diagnosis not present

## 2023-11-13 DIAGNOSIS — Z7901 Long term (current) use of anticoagulants: Secondary | ICD-10-CM | POA: Diagnosis not present

## 2023-11-13 DIAGNOSIS — I483 Typical atrial flutter: Secondary | ICD-10-CM | POA: Diagnosis not present

## 2023-11-13 DIAGNOSIS — E1159 Type 2 diabetes mellitus with other circulatory complications: Secondary | ICD-10-CM | POA: Diagnosis not present

## 2023-11-13 DIAGNOSIS — I10 Essential (primary) hypertension: Secondary | ICD-10-CM | POA: Diagnosis not present

## 2023-11-13 DIAGNOSIS — D751 Secondary polycythemia: Secondary | ICD-10-CM | POA: Diagnosis not present

## 2023-11-13 DIAGNOSIS — N1832 Chronic kidney disease, stage 3b: Secondary | ICD-10-CM | POA: Diagnosis not present

## 2023-11-13 DIAGNOSIS — G4733 Obstructive sleep apnea (adult) (pediatric): Secondary | ICD-10-CM | POA: Diagnosis not present

## 2023-11-13 DIAGNOSIS — E559 Vitamin D deficiency, unspecified: Secondary | ICD-10-CM | POA: Diagnosis not present

## 2023-11-13 DIAGNOSIS — Z86711 Personal history of pulmonary embolism: Secondary | ICD-10-CM | POA: Diagnosis not present

## 2023-12-07 ENCOUNTER — Other Ambulatory Visit (HOSPITAL_COMMUNITY): Payer: Self-pay | Admitting: Internal Medicine

## 2023-12-07 DIAGNOSIS — R270 Ataxia, unspecified: Secondary | ICD-10-CM

## 2023-12-10 DIAGNOSIS — R2681 Unsteadiness on feet: Secondary | ICD-10-CM | POA: Diagnosis not present

## 2023-12-10 DIAGNOSIS — I25118 Atherosclerotic heart disease of native coronary artery with other forms of angina pectoris: Secondary | ICD-10-CM | POA: Diagnosis not present

## 2023-12-10 DIAGNOSIS — R413 Other amnesia: Secondary | ICD-10-CM | POA: Diagnosis not present

## 2023-12-10 DIAGNOSIS — E1159 Type 2 diabetes mellitus with other circulatory complications: Secondary | ICD-10-CM | POA: Diagnosis not present

## 2023-12-10 DIAGNOSIS — I10 Essential (primary) hypertension: Secondary | ICD-10-CM | POA: Diagnosis not present

## 2023-12-10 DIAGNOSIS — Z8679 Personal history of other diseases of the circulatory system: Secondary | ICD-10-CM | POA: Diagnosis not present

## 2023-12-10 DIAGNOSIS — D751 Secondary polycythemia: Secondary | ICD-10-CM | POA: Diagnosis not present

## 2023-12-10 DIAGNOSIS — N1832 Chronic kidney disease, stage 3b: Secondary | ICD-10-CM | POA: Diagnosis not present

## 2023-12-10 DIAGNOSIS — I483 Typical atrial flutter: Secondary | ICD-10-CM | POA: Diagnosis not present

## 2023-12-21 DIAGNOSIS — Z7901 Long term (current) use of anticoagulants: Secondary | ICD-10-CM | POA: Diagnosis not present

## 2023-12-22 DIAGNOSIS — I483 Typical atrial flutter: Secondary | ICD-10-CM | POA: Diagnosis not present

## 2023-12-22 DIAGNOSIS — I25721 Atherosclerosis of autologous artery coronary artery bypass graft(s) with angina pectoris with documented spasm: Secondary | ICD-10-CM | POA: Diagnosis not present

## 2023-12-22 DIAGNOSIS — E1142 Type 2 diabetes mellitus with diabetic polyneuropathy: Secondary | ICD-10-CM | POA: Diagnosis not present

## 2023-12-22 DIAGNOSIS — N1832 Chronic kidney disease, stage 3b: Secondary | ICD-10-CM | POA: Diagnosis not present

## 2023-12-24 ENCOUNTER — Ambulatory Visit (HOSPITAL_COMMUNITY)
Admission: RE | Admit: 2023-12-24 | Discharge: 2023-12-24 | Disposition: A | Discharge status: Home / Self Care | Source: Ambulatory Visit | Attending: Internal Medicine | Admitting: Internal Medicine

## 2023-12-24 DIAGNOSIS — M47814 Spondylosis without myelopathy or radiculopathy, thoracic region: Secondary | ICD-10-CM | POA: Diagnosis not present

## 2023-12-24 DIAGNOSIS — M5124 Other intervertebral disc displacement, thoracic region: Secondary | ICD-10-CM | POA: Diagnosis not present

## 2023-12-24 DIAGNOSIS — M4804 Spinal stenosis, thoracic region: Secondary | ICD-10-CM | POA: Diagnosis not present

## 2023-12-24 DIAGNOSIS — R270 Ataxia, unspecified: Secondary | ICD-10-CM | POA: Insufficient documentation

## 2023-12-24 DIAGNOSIS — M47812 Spondylosis without myelopathy or radiculopathy, cervical region: Secondary | ICD-10-CM | POA: Diagnosis not present

## 2023-12-24 DIAGNOSIS — M4802 Spinal stenosis, cervical region: Secondary | ICD-10-CM | POA: Diagnosis not present

## 2023-12-24 DIAGNOSIS — N281 Cyst of kidney, acquired: Secondary | ICD-10-CM | POA: Diagnosis not present

## 2023-12-28 DIAGNOSIS — N1832 Chronic kidney disease, stage 3b: Secondary | ICD-10-CM | POA: Diagnosis not present

## 2023-12-28 DIAGNOSIS — E1142 Type 2 diabetes mellitus with diabetic polyneuropathy: Secondary | ICD-10-CM | POA: Diagnosis not present

## 2023-12-28 DIAGNOSIS — I483 Typical atrial flutter: Secondary | ICD-10-CM | POA: Diagnosis not present

## 2023-12-28 DIAGNOSIS — I25118 Atherosclerotic heart disease of native coronary artery with other forms of angina pectoris: Secondary | ICD-10-CM | POA: Diagnosis not present

## 2023-12-30 DIAGNOSIS — Z7901 Long term (current) use of anticoagulants: Secondary | ICD-10-CM | POA: Diagnosis not present

## 2024-01-20 DIAGNOSIS — I483 Typical atrial flutter: Secondary | ICD-10-CM | POA: Diagnosis not present

## 2024-01-20 DIAGNOSIS — N1832 Chronic kidney disease, stage 3b: Secondary | ICD-10-CM | POA: Diagnosis not present

## 2024-01-20 DIAGNOSIS — E1142 Type 2 diabetes mellitus with diabetic polyneuropathy: Secondary | ICD-10-CM | POA: Diagnosis not present

## 2024-01-20 DIAGNOSIS — I25721 Atherosclerosis of autologous artery coronary artery bypass graft(s) with angina pectoris with documented spasm: Secondary | ICD-10-CM | POA: Diagnosis not present

## 2024-01-21 DIAGNOSIS — L821 Other seborrheic keratosis: Secondary | ICD-10-CM | POA: Diagnosis not present

## 2024-01-21 DIAGNOSIS — D225 Melanocytic nevi of trunk: Secondary | ICD-10-CM | POA: Diagnosis not present

## 2024-01-21 DIAGNOSIS — L814 Other melanin hyperpigmentation: Secondary | ICD-10-CM | POA: Diagnosis not present

## 2024-01-21 DIAGNOSIS — L820 Inflamed seborrheic keratosis: Secondary | ICD-10-CM | POA: Diagnosis not present

## 2024-01-21 DIAGNOSIS — L570 Actinic keratosis: Secondary | ICD-10-CM | POA: Diagnosis not present

## 2024-01-27 DIAGNOSIS — I483 Typical atrial flutter: Secondary | ICD-10-CM | POA: Diagnosis not present

## 2024-01-27 DIAGNOSIS — E1142 Type 2 diabetes mellitus with diabetic polyneuropathy: Secondary | ICD-10-CM | POA: Diagnosis not present

## 2024-01-27 DIAGNOSIS — N1832 Chronic kidney disease, stage 3b: Secondary | ICD-10-CM | POA: Diagnosis not present

## 2024-01-27 DIAGNOSIS — I25721 Atherosclerosis of autologous artery coronary artery bypass graft(s) with angina pectoris with documented spasm: Secondary | ICD-10-CM | POA: Diagnosis not present

## 2024-01-27 DIAGNOSIS — I25118 Atherosclerotic heart disease of native coronary artery with other forms of angina pectoris: Secondary | ICD-10-CM | POA: Diagnosis not present

## 2024-02-02 DIAGNOSIS — I483 Typical atrial flutter: Secondary | ICD-10-CM | POA: Diagnosis not present

## 2024-02-02 DIAGNOSIS — R413 Other amnesia: Secondary | ICD-10-CM | POA: Diagnosis not present

## 2024-02-02 DIAGNOSIS — I10 Essential (primary) hypertension: Secondary | ICD-10-CM | POA: Diagnosis not present

## 2024-02-02 DIAGNOSIS — Z0000 Encounter for general adult medical examination without abnormal findings: Secondary | ICD-10-CM | POA: Diagnosis not present

## 2024-02-02 DIAGNOSIS — E1159 Type 2 diabetes mellitus with other circulatory complications: Secondary | ICD-10-CM | POA: Diagnosis not present

## 2024-02-02 DIAGNOSIS — Z7901 Long term (current) use of anticoagulants: Secondary | ICD-10-CM | POA: Diagnosis not present

## 2024-02-02 DIAGNOSIS — I25118 Atherosclerotic heart disease of native coronary artery with other forms of angina pectoris: Secondary | ICD-10-CM | POA: Diagnosis not present

## 2024-02-02 DIAGNOSIS — W540XXA Bitten by dog, initial encounter: Secondary | ICD-10-CM | POA: Diagnosis not present

## 2024-02-02 DIAGNOSIS — N1832 Chronic kidney disease, stage 3b: Secondary | ICD-10-CM | POA: Diagnosis not present

## 2024-02-02 DIAGNOSIS — E1122 Type 2 diabetes mellitus with diabetic chronic kidney disease: Secondary | ICD-10-CM | POA: Diagnosis not present

## 2024-02-12 DIAGNOSIS — I25118 Atherosclerotic heart disease of native coronary artery with other forms of angina pectoris: Secondary | ICD-10-CM | POA: Diagnosis not present

## 2024-02-12 DIAGNOSIS — Z8679 Personal history of other diseases of the circulatory system: Secondary | ICD-10-CM | POA: Diagnosis not present

## 2024-02-12 DIAGNOSIS — I10 Essential (primary) hypertension: Secondary | ICD-10-CM | POA: Diagnosis not present

## 2024-02-12 DIAGNOSIS — E1122 Type 2 diabetes mellitus with diabetic chronic kidney disease: Secondary | ICD-10-CM | POA: Diagnosis not present

## 2024-02-12 DIAGNOSIS — R2681 Unsteadiness on feet: Secondary | ICD-10-CM | POA: Diagnosis not present

## 2024-02-12 DIAGNOSIS — I483 Typical atrial flutter: Secondary | ICD-10-CM | POA: Diagnosis not present

## 2024-02-12 DIAGNOSIS — W540XXD Bitten by dog, subsequent encounter: Secondary | ICD-10-CM | POA: Diagnosis not present

## 2024-02-12 DIAGNOSIS — E1159 Type 2 diabetes mellitus with other circulatory complications: Secondary | ICD-10-CM | POA: Diagnosis not present

## 2024-02-12 DIAGNOSIS — N1832 Chronic kidney disease, stage 3b: Secondary | ICD-10-CM | POA: Diagnosis not present

## 2024-02-19 DIAGNOSIS — I25721 Atherosclerosis of autologous artery coronary artery bypass graft(s) with angina pectoris with documented spasm: Secondary | ICD-10-CM | POA: Diagnosis not present

## 2024-02-19 DIAGNOSIS — N1832 Chronic kidney disease, stage 3b: Secondary | ICD-10-CM | POA: Diagnosis not present

## 2024-02-19 DIAGNOSIS — I483 Typical atrial flutter: Secondary | ICD-10-CM | POA: Diagnosis not present

## 2024-02-19 DIAGNOSIS — E1142 Type 2 diabetes mellitus with diabetic polyneuropathy: Secondary | ICD-10-CM | POA: Diagnosis not present

## 2024-02-27 DIAGNOSIS — I25721 Atherosclerosis of autologous artery coronary artery bypass graft(s) with angina pectoris with documented spasm: Secondary | ICD-10-CM | POA: Diagnosis not present

## 2024-02-27 DIAGNOSIS — I25118 Atherosclerotic heart disease of native coronary artery with other forms of angina pectoris: Secondary | ICD-10-CM | POA: Diagnosis not present

## 2024-02-27 DIAGNOSIS — E1142 Type 2 diabetes mellitus with diabetic polyneuropathy: Secondary | ICD-10-CM | POA: Diagnosis not present

## 2024-02-27 DIAGNOSIS — N1832 Chronic kidney disease, stage 3b: Secondary | ICD-10-CM | POA: Diagnosis not present

## 2024-02-27 DIAGNOSIS — I483 Typical atrial flutter: Secondary | ICD-10-CM | POA: Diagnosis not present

## 2024-03-11 DIAGNOSIS — S2241XA Multiple fractures of ribs, right side, initial encounter for closed fracture: Secondary | ICD-10-CM | POA: Diagnosis not present

## 2024-03-11 DIAGNOSIS — R0781 Pleurodynia: Secondary | ICD-10-CM | POA: Diagnosis not present

## 2024-03-16 DIAGNOSIS — Z7901 Long term (current) use of anticoagulants: Secondary | ICD-10-CM | POA: Diagnosis not present

## 2024-03-20 DIAGNOSIS — N1832 Chronic kidney disease, stage 3b: Secondary | ICD-10-CM | POA: Diagnosis not present

## 2024-03-20 DIAGNOSIS — E1142 Type 2 diabetes mellitus with diabetic polyneuropathy: Secondary | ICD-10-CM | POA: Diagnosis not present

## 2024-03-20 DIAGNOSIS — I483 Typical atrial flutter: Secondary | ICD-10-CM | POA: Diagnosis not present

## 2024-03-20 DIAGNOSIS — I25721 Atherosclerosis of autologous artery coronary artery bypass graft(s) with angina pectoris with documented spasm: Secondary | ICD-10-CM | POA: Diagnosis not present

## 2024-03-28 DIAGNOSIS — E1142 Type 2 diabetes mellitus with diabetic polyneuropathy: Secondary | ICD-10-CM | POA: Diagnosis not present

## 2024-03-28 DIAGNOSIS — I25721 Atherosclerosis of autologous artery coronary artery bypass graft(s) with angina pectoris with documented spasm: Secondary | ICD-10-CM | POA: Diagnosis not present

## 2024-03-28 DIAGNOSIS — I483 Typical atrial flutter: Secondary | ICD-10-CM | POA: Diagnosis not present

## 2024-03-28 DIAGNOSIS — N1832 Chronic kidney disease, stage 3b: Secondary | ICD-10-CM | POA: Diagnosis not present

## 2024-03-28 DIAGNOSIS — I25118 Atherosclerotic heart disease of native coronary artery with other forms of angina pectoris: Secondary | ICD-10-CM | POA: Diagnosis not present

## 2024-04-04 DIAGNOSIS — Z7901 Long term (current) use of anticoagulants: Secondary | ICD-10-CM | POA: Diagnosis not present

## 2024-04-13 DIAGNOSIS — Z7901 Long term (current) use of anticoagulants: Secondary | ICD-10-CM | POA: Diagnosis not present

## 2024-04-15 ENCOUNTER — Ambulatory Visit: Admitting: Nurse Practitioner

## 2024-04-19 DIAGNOSIS — N1832 Chronic kidney disease, stage 3b: Secondary | ICD-10-CM | POA: Diagnosis not present

## 2024-04-19 DIAGNOSIS — I483 Typical atrial flutter: Secondary | ICD-10-CM | POA: Diagnosis not present

## 2024-04-19 DIAGNOSIS — I25721 Atherosclerosis of autologous artery coronary artery bypass graft(s) with angina pectoris with documented spasm: Secondary | ICD-10-CM | POA: Diagnosis not present

## 2024-04-19 DIAGNOSIS — E1142 Type 2 diabetes mellitus with diabetic polyneuropathy: Secondary | ICD-10-CM | POA: Diagnosis not present

## 2024-04-21 DIAGNOSIS — Z7901 Long term (current) use of anticoagulants: Secondary | ICD-10-CM | POA: Diagnosis not present

## 2024-04-28 ENCOUNTER — Ambulatory Visit: Admitting: Cardiology

## 2024-04-28 DIAGNOSIS — I483 Typical atrial flutter: Secondary | ICD-10-CM | POA: Diagnosis not present

## 2024-04-28 DIAGNOSIS — E1142 Type 2 diabetes mellitus with diabetic polyneuropathy: Secondary | ICD-10-CM | POA: Diagnosis not present

## 2024-04-28 DIAGNOSIS — I25721 Atherosclerosis of autologous artery coronary artery bypass graft(s) with angina pectoris with documented spasm: Secondary | ICD-10-CM | POA: Diagnosis not present

## 2024-04-28 DIAGNOSIS — N1832 Chronic kidney disease, stage 3b: Secondary | ICD-10-CM | POA: Diagnosis not present

## 2024-04-28 DIAGNOSIS — I25118 Atherosclerotic heart disease of native coronary artery with other forms of angina pectoris: Secondary | ICD-10-CM | POA: Diagnosis not present

## 2024-05-06 DIAGNOSIS — Z7901 Long term (current) use of anticoagulants: Secondary | ICD-10-CM | POA: Diagnosis not present

## 2024-05-11 DIAGNOSIS — Z86711 Personal history of pulmonary embolism: Secondary | ICD-10-CM | POA: Diagnosis not present

## 2024-05-11 DIAGNOSIS — I10 Essential (primary) hypertension: Secondary | ICD-10-CM | POA: Diagnosis not present

## 2024-05-11 DIAGNOSIS — Z23 Encounter for immunization: Secondary | ICD-10-CM | POA: Diagnosis not present

## 2024-05-11 DIAGNOSIS — I25118 Atherosclerotic heart disease of native coronary artery with other forms of angina pectoris: Secondary | ICD-10-CM | POA: Diagnosis not present

## 2024-05-11 DIAGNOSIS — Z7901 Long term (current) use of anticoagulants: Secondary | ICD-10-CM | POA: Diagnosis not present

## 2024-05-11 DIAGNOSIS — E1122 Type 2 diabetes mellitus with diabetic chronic kidney disease: Secondary | ICD-10-CM | POA: Diagnosis not present

## 2024-05-11 DIAGNOSIS — R2681 Unsteadiness on feet: Secondary | ICD-10-CM | POA: Diagnosis not present

## 2024-05-11 DIAGNOSIS — N1832 Chronic kidney disease, stage 3b: Secondary | ICD-10-CM | POA: Diagnosis not present

## 2024-05-11 DIAGNOSIS — E1159 Type 2 diabetes mellitus with other circulatory complications: Secondary | ICD-10-CM | POA: Diagnosis not present

## 2024-05-11 DIAGNOSIS — I483 Typical atrial flutter: Secondary | ICD-10-CM | POA: Diagnosis not present

## 2024-05-19 DIAGNOSIS — E1142 Type 2 diabetes mellitus with diabetic polyneuropathy: Secondary | ICD-10-CM | POA: Diagnosis not present

## 2024-05-19 DIAGNOSIS — I25721 Atherosclerosis of autologous artery coronary artery bypass graft(s) with angina pectoris with documented spasm: Secondary | ICD-10-CM | POA: Diagnosis not present

## 2024-05-19 DIAGNOSIS — I483 Typical atrial flutter: Secondary | ICD-10-CM | POA: Diagnosis not present

## 2024-05-19 DIAGNOSIS — N1832 Chronic kidney disease, stage 3b: Secondary | ICD-10-CM | POA: Diagnosis not present

## 2024-05-29 DIAGNOSIS — I25118 Atherosclerotic heart disease of native coronary artery with other forms of angina pectoris: Secondary | ICD-10-CM | POA: Diagnosis not present

## 2024-05-29 DIAGNOSIS — N1832 Chronic kidney disease, stage 3b: Secondary | ICD-10-CM | POA: Diagnosis not present

## 2024-05-29 DIAGNOSIS — E1142 Type 2 diabetes mellitus with diabetic polyneuropathy: Secondary | ICD-10-CM | POA: Diagnosis not present

## 2024-05-29 DIAGNOSIS — I25721 Atherosclerosis of autologous artery coronary artery bypass graft(s) with angina pectoris with documented spasm: Secondary | ICD-10-CM | POA: Diagnosis not present

## 2024-05-29 DIAGNOSIS — I483 Typical atrial flutter: Secondary | ICD-10-CM | POA: Diagnosis not present

## 2024-05-31 ENCOUNTER — Ambulatory Visit: Attending: Nurse Practitioner | Admitting: Nurse Practitioner

## 2024-05-31 ENCOUNTER — Encounter: Payer: Self-pay | Admitting: Nurse Practitioner

## 2024-05-31 VITALS — BP 110/52 | HR 60 | Ht 70.0 in | Wt 174.0 lb

## 2024-05-31 DIAGNOSIS — I779 Disorder of arteries and arterioles, unspecified: Secondary | ICD-10-CM

## 2024-05-31 DIAGNOSIS — E1169 Type 2 diabetes mellitus with other specified complication: Secondary | ICD-10-CM

## 2024-05-31 DIAGNOSIS — N1831 Chronic kidney disease, stage 3a: Secondary | ICD-10-CM | POA: Diagnosis not present

## 2024-05-31 DIAGNOSIS — I10 Essential (primary) hypertension: Secondary | ICD-10-CM | POA: Diagnosis not present

## 2024-05-31 DIAGNOSIS — E669 Obesity, unspecified: Secondary | ICD-10-CM

## 2024-05-31 DIAGNOSIS — I483 Typical atrial flutter: Secondary | ICD-10-CM | POA: Diagnosis not present

## 2024-05-31 DIAGNOSIS — R42 Dizziness and giddiness: Secondary | ICD-10-CM

## 2024-05-31 DIAGNOSIS — I2510 Atherosclerotic heart disease of native coronary artery without angina pectoris: Secondary | ICD-10-CM

## 2024-05-31 NOTE — Patient Instructions (Signed)
 Medication Instructions:  Your physician recommends that you continue on your current medications as directed. Please refer to the Current Medication list given to you today. *If you need a refill on your cardiac medications before your next appointment, please call your pharmacy*  Lab Work: None ordered If you have labs (blood work) drawn today and your tests are completely normal, you will receive your results only by: MyChart Message (if you have MyChart) OR A paper copy in the mail If you have any lab test that is abnormal or we need to change your treatment, we will call you to review the results.  Testing/Procedures: Your physician has requested that you have a carotid duplex. This test is an ultrasound of the carotid arteries in your neck. It looks at blood flow through these arteries that supply the brain with blood. Allow one hour for this exam. There are no restrictions or special instructions.  Your physician has requested that you have an echocardiogram. Echocardiography is a painless test that uses sound waves to create images of your heart. It provides your doctor with information about the size and shape of your heart and how well your heart's chambers and valves are working. This procedure takes approximately one hour. There are no restrictions for this procedure. Please do NOT wear cologne, perfume, aftershave, or lotions (deodorant is allowed). Please arrive 15 minutes prior to your appointment time.  Please note: We ask at that you not bring children with you during ultrasound (echo/ vascular) testing. Due to room size and safety concerns, children are not allowed in the ultrasound rooms during exams. Our front office staff cannot provide observation of children in our lobby area while testing is being conducted. An adult accompanying a patient to their appointment will only be allowed in the ultrasound room at the discretion of the ultrasound technician under special  circumstances. We apologize for any inconvenience.  Follow-Up: At [facility], you and your health needs are our priority.  As part of our continuing mission to provide you with exceptional heart care, our providers are all part of one team.  This team includes your primary Cardiologist (physician) and Advanced Practice Providers or APPs (Physician Assistants and Nurse Practitioners) who all work together to provide you with the care you need, when you need it.  Your next appointment:   12 month(s)  Provider:   Oneil Parchment, MD    We recommend signing up for the patient portal called MyChart.  Sign up information is provided on this After Visit Summary.  MyChart is used to connect with patients for Virtual Visits (Telemedicine).  Patients are able to view lab/test results, encounter notes, upcoming appointments, etc.  Non-urgent messages can be sent to your provider as well.   To learn more about what you can do with MyChart, go to [email or web address].   Other Instructions

## 2024-05-31 NOTE — Progress Notes (Signed)
 Error

## 2024-05-31 NOTE — Progress Notes (Signed)
 Cardiology Office Note    Patient Name: Evan [name] Date of Encounter: 05/31/2024  Primary Care Provider:  Charlott Dorn LABOR, MD Primary Cardiologist:  Oneil Parchment, MD Primary Electrophysiologist: None   Past Medical History    Past Medical History:  Diagnosis Date   Acquired dilation of ascending aorta and aortic root (HCC)    a.) measurements by TTE in 04/2021 --> root 40 mm and ascending aorta 43 mm   Anginal pain (HCC)    Anxiety    Atrial flutter (HCC) 04/22/2021   a.) CHADS2VASc = 5 (age x 2, HTN, T2DM, CAD); b.) daily warfarin   Carotid disease, bilateral (HCC)    a.) Doppler in 2019 --> 1-39% stenosis on RIGHT and 40-58% on LEFT   Cataracts, bilateral    Chronic anticoagulation    Warfarin   Chronic renal insufficiency    Coronary artery disease    a.) s/p 3v CABG in 1999   Depression    GERD (gastroesophageal reflux disease)    History of kidney stones    Hypercholesteremia    Hypertension    Kidney stone    Low testosterone     on TRT injections   Malignant melanoma (HCC)    a.) face, nose, left leg; b.) Tx'd with chemotherapy + XRT at [facility]   Myocardial infarction [facility])    Pulmonary emboli (HCC)    S/P CABG x 3 1999   a.) LIMA-LAD, SVG-RCA, SVG-diagnonal   Sleep apnea    not using nocturnal PAP therapy   T2DM (type 2 diabetes mellitus) (HCC)     History of Present Illness  Evan [name]  is a 78 year old male with a PMH of CAD s/p CABG 1999, typical atrial flutter (on Coumadin ) HLD, HTN, melanoma (in remission), carotid artery disease, DM type II, bilateral pulmonary embolism, sleep apnea (not on CPAP), who presents today for 25-month follow-up.   Evan [name] was last seen on 05/28/2023 for annual follow-up.  During visit patient reported episodes of dizziness that occurred predominantly when standing and when turning his head suddenly.  He denied any palpitations or chest pain.  His blood pressure was stable but low in the 90s over 50s.   He was encouraged to change positions slowly and to try compression stocks with plan to decrease Toprol  XL to 12.5 mg if symptoms persist.  Evan [name] presents today with his wife for annual follow-up. He has a history of atrial flutter and is currently on metoprolol , with a recent dose reduction from 25 mg to 12.5 mg daily. He has not experienced recent palpitations or sustained episodes of tachycardia.  He experiences occasional dizziness, particularly upon standing, which has improved since the metoprolol  dose adjustment. Previously, dizziness was more pronounced when standing or turning his head suddenly. He uses a cane for support but often forgets it when leaving the car. Approximately two months ago, he experienced a fall resulting in cracked ribs. He reports some residual soreness but no significant ongoing issues. He did not strike his head during the fall. He engages in light physical activity, including walking his dogs and using a push lawnmower for short distances. He feels proud of his ability to perform these activities and reports feeling better overall. He is on Coumadin  and has regular INR checks. His last A1c was 8.3, indicating it is not at goal. His creatinine level is stable at 1.8, which is elevated but consistent with previous results. A carotid ultrasound in 2019 showed mild stenosis on  the right and moderate stenosis on the left. He has not had any new symptoms suggestive of progression. Patient denies chest pain, palpitations, dyspnea, PND, orthopnea, nausea, vomiting, dizziness, syncope, edema, weight gain, or early satiety.  Discussed the use of AI scribe software for clinical note transcription with the patient, who gave verbal consent to proceed.  History of Present Illness   Review of Systems  Please see the history of present illness.    All other systems reviewed and are otherwise negative except as noted above.  Physical Exam    Wt Readings from Last 3  Encounters:  05/31/24 174 lb (78.9 kg)  05/28/23 196 lb 12.8 oz (89.3 kg)  02/14/23 210 lb (95.3 kg)   VS: Vitals:   05/31/24 1545  BP: (!) 110/52  Pulse: 60  SpO2: 99%  ,Body mass index is 24.97 kg/m. GEN: Well nourished, well developed in no acute distress Neck: No JVD; No carotid bruits Pulmonary: Clear to auscultation without rales, wheezing or rhonchi  Cardiovascular: Normal rate. Regular rhythm. Normal S1. Normal S2.   Murmurs: There is no murmur.  ABDOMEN: Soft, non-tender, non-distended EXTREMITIES:  No edema; No deformity   EKG/LABS/ Recent Cardiac Studies   ECG personally reviewed by me today -sinus rhythm with right bundle branch block rate of 60 bpm with no acute changes consistent with previous EKG.  Risk Assessment/Calculations:    CHA2DS2-VASc Score = 5   This indicates a 7.2% annual risk of stroke. The patient's score is based upon: CHF History: 0 HTN History: 1 Diabetes History: 1 Stroke History: 0 Vascular Disease History: 1 Age Score: 2 Gender Score: 0         Lab Results  Component Value Date   WBC 8.0 02/14/2023   HGB 16.4 02/14/2023   HCT 51.1 02/14/2023   MCV 93.4 02/14/2023   PLT 176 02/14/2023   Lab Results  Component Value Date   CREATININE 1.89 (H) 02/14/2023   BUN 35 (H) 02/14/2023   NA 133 (L) 02/14/2023   K 5.1 02/14/2023   CL 104 02/14/2023   CO2 22 02/14/2023   No results found for: CHOL, HDL, LDLCALC, LDLDIRECT, TRIG, CHOLHDL  Lab Results  Component Value Date   HGBA1C 5.9 (H) 02/14/2023   Assessment & Plan    Assessment and Plan Assessment & Plan   1.Typical atrial flutter: -s/p DCCV on 07/2021 and currently rate controlled with Toprol -XL -Patient is currently on Coumadin  and followed by Coumadin  clinic -Currently in sinus rhythm with no palpitations or tachycardia. EKG stable. - Continue monitoring heart rate and rhythm. - INR currently managed by PCP -Continue Coumadin  5 mg -Continue rate  control with Toprol -XL 12.5 mg daily   2.Coronary artery disease: -CAD/CABG 1999 today patient denies any chest pain or angina since previous follow-up. -Continue current GDMT with Toprol -XL 12.5 mg, Crestor  20 mg   3.Carotid artery disease: -Repeat carotid ultrasound for surveillance of carotid disease - Previous ultrasound from 2019 showed bilateral carotid disease with 1-39% on the right and 40-59% on the left -Continue GDMT with Crestor  20 mg daily   4.Dizziness: -Dizziness, likely medication-related, improved Dizziness improved after reducing metoprolol  to 12.5 mg daily, likely due to beta-blocker effect. - Continue metoprolol  12.5 mg daily. - Advise use of compression stockings during long trips.   5.  Essential hypertension: -Patient's blood pressure today was stable at 110/52 - Continue Toprol -XL 12.5 mg  6. Type 2 diabetes mellitus: A1c elevated at 8.3, indicating uncontrolled diabetes. - Continue Jardiance  and current treatment plan per PCP  7. Chronic kidney disease stage III: Chronic kidney disease stable with creatinine level of 1.8. - Continue monitoring kidney function. - Ensure adequate hydration.  Disposition: Follow-up with Oneil Parchment, MD or APP in 12 months    Signed, Wyn Raddle, Jackee Shove, NP 05/31/2024, 4:23 PM [facility]

## 2024-06-09 DIAGNOSIS — Z7901 Long term (current) use of anticoagulants: Secondary | ICD-10-CM | POA: Diagnosis not present

## 2024-06-18 DIAGNOSIS — I25721 Atherosclerosis of autologous artery coronary artery bypass graft(s) with angina pectoris with documented spasm: Secondary | ICD-10-CM | POA: Diagnosis not present

## 2024-06-18 DIAGNOSIS — I483 Typical atrial flutter: Secondary | ICD-10-CM | POA: Diagnosis not present

## 2024-06-18 DIAGNOSIS — N1832 Chronic kidney disease, stage 3b: Secondary | ICD-10-CM | POA: Diagnosis not present

## 2024-06-18 DIAGNOSIS — E1142 Type 2 diabetes mellitus with diabetic polyneuropathy: Secondary | ICD-10-CM | POA: Diagnosis not present

## 2024-06-21 ENCOUNTER — Ambulatory Visit: Admitting: Nurse Practitioner

## 2024-06-22 DIAGNOSIS — Z7901 Long term (current) use of anticoagulants: Secondary | ICD-10-CM | POA: Diagnosis not present

## 2024-06-23 ENCOUNTER — Ambulatory Visit (HOSPITAL_COMMUNITY): Admission: RE | Admit: 2024-06-23 | Source: Ambulatory Visit

## 2024-06-23 ENCOUNTER — Ambulatory Visit (HOSPITAL_COMMUNITY)

## 2024-06-28 DIAGNOSIS — E1142 Type 2 diabetes mellitus with diabetic polyneuropathy: Secondary | ICD-10-CM | POA: Diagnosis not present

## 2024-06-28 DIAGNOSIS — I483 Typical atrial flutter: Secondary | ICD-10-CM | POA: Diagnosis not present

## 2024-06-28 DIAGNOSIS — I25721 Atherosclerosis of autologous artery coronary artery bypass graft(s) with angina pectoris with documented spasm: Secondary | ICD-10-CM | POA: Diagnosis not present

## 2024-06-28 DIAGNOSIS — N1832 Chronic kidney disease, stage 3b: Secondary | ICD-10-CM | POA: Diagnosis not present

## 2024-06-28 DIAGNOSIS — I25118 Atherosclerotic heart disease of native coronary artery with other forms of angina pectoris: Secondary | ICD-10-CM | POA: Diagnosis not present

## 2024-07-05 DIAGNOSIS — D1801 Hemangioma of skin and subcutaneous tissue: Secondary | ICD-10-CM | POA: Diagnosis not present

## 2024-07-05 DIAGNOSIS — L570 Actinic keratosis: Secondary | ICD-10-CM | POA: Diagnosis not present

## 2024-07-05 DIAGNOSIS — Z08 Encounter for follow-up examination after completed treatment for malignant neoplasm: Secondary | ICD-10-CM | POA: Diagnosis not present

## 2024-07-05 DIAGNOSIS — L304 Erythema intertrigo: Secondary | ICD-10-CM | POA: Diagnosis not present

## 2024-07-05 DIAGNOSIS — Z09 Encounter for follow-up examination after completed treatment for conditions other than malignant neoplasm: Secondary | ICD-10-CM | POA: Diagnosis not present

## 2024-07-05 DIAGNOSIS — Z85828 Personal history of other malignant neoplasm of skin: Secondary | ICD-10-CM | POA: Diagnosis not present

## 2024-07-05 DIAGNOSIS — Z86007 Personal history of in-situ neoplasm of skin: Secondary | ICD-10-CM | POA: Diagnosis not present

## 2024-07-05 DIAGNOSIS — L821 Other seborrheic keratosis: Secondary | ICD-10-CM | POA: Diagnosis not present

## 2024-07-05 DIAGNOSIS — Z85820 Personal history of malignant melanoma of skin: Secondary | ICD-10-CM | POA: Diagnosis not present

## 2024-07-05 DIAGNOSIS — L814 Other melanin hyperpigmentation: Secondary | ICD-10-CM | POA: Diagnosis not present

## 2024-07-07 DIAGNOSIS — Z7901 Long term (current) use of anticoagulants: Secondary | ICD-10-CM | POA: Diagnosis not present

## 2024-07-18 ENCOUNTER — Ambulatory Visit (HOSPITAL_COMMUNITY)
Admission: RE | Admit: 2024-07-18 | Discharge: 2024-07-18 | Disposition: A | Source: Ambulatory Visit | Attending: Nurse Practitioner | Admitting: Nurse Practitioner

## 2024-07-18 ENCOUNTER — Ambulatory Visit (HOSPITAL_COMMUNITY)
Admission: RE | Admit: 2024-07-18 | Discharge: 2024-07-18 | Disposition: A | Discharge status: Home / Self Care | Source: Ambulatory Visit | Attending: Nurse Practitioner | Admitting: Nurse Practitioner

## 2024-07-18 DIAGNOSIS — R42 Dizziness and giddiness: Secondary | ICD-10-CM | POA: Insufficient documentation

## 2024-07-18 DIAGNOSIS — I483 Typical atrial flutter: Secondary | ICD-10-CM | POA: Diagnosis not present

## 2024-07-18 DIAGNOSIS — I779 Disorder of arteries and arterioles, unspecified: Secondary | ICD-10-CM | POA: Insufficient documentation

## 2024-07-18 DIAGNOSIS — I6522 Occlusion and stenosis of left carotid artery: Secondary | ICD-10-CM | POA: Insufficient documentation

## 2024-07-18 DIAGNOSIS — I10 Essential (primary) hypertension: Secondary | ICD-10-CM

## 2024-07-18 DIAGNOSIS — I2510 Atherosclerotic heart disease of native coronary artery without angina pectoris: Secondary | ICD-10-CM

## 2024-07-18 LAB — ECHOCARDIOGRAM COMPLETE
AR max vel: 1.97 cm2
AV Area VTI: 2.03 cm2
AV Area mean vel: 2.11 cm2
AV Mean grad: 4 mmHg
AV Peak grad: 8.8 mmHg
Ao pk vel: 1.48 m/s
Area-P 1/2: 3.1 cm2
S' Lateral: 3.12 cm

## 2024-07-19 ENCOUNTER — Ambulatory Visit (HOSPITAL_COMMUNITY): Payer: Self-pay | Admitting: Nurse Practitioner

## 2024-07-19 NOTE — Progress Notes (Signed)
 2nd attempt : Called pt, couldn't leave a voicemail due to voicemail being full. Will try again at a later time.

## 2024-07-20 NOTE — Telephone Encounter (Signed)
 Called pt to discuss echo results. Mailbox full unable to contact pt. 3rd attempt.
